# Patient Record
Sex: Male | Born: 1965 | Race: Black or African American | Hispanic: No | Marital: Married | Smoking: Former smoker
Health system: Southern US, Community
[De-identification: ages and names within clinical notes are randomized; demographics above are authoritative.]

## PROBLEM LIST (undated history)

## (undated) DIAGNOSIS — K219 Gastro-esophageal reflux disease without esophagitis: Secondary | ICD-10-CM

## (undated) DIAGNOSIS — R011 Cardiac murmur, unspecified: Secondary | ICD-10-CM

## (undated) DIAGNOSIS — I10 Essential (primary) hypertension: Secondary | ICD-10-CM

## (undated) DIAGNOSIS — G4733 Obstructive sleep apnea (adult) (pediatric): Principal | ICD-10-CM

## (undated) DIAGNOSIS — R9431 Abnormal electrocardiogram [ECG] [EKG]: Secondary | ICD-10-CM

## (undated) DIAGNOSIS — K5792 Diverticulitis of intestine, part unspecified, without perforation or abscess without bleeding: Secondary | ICD-10-CM

## (undated) DIAGNOSIS — K432 Incisional hernia without obstruction or gangrene: Secondary | ICD-10-CM

## (undated) HISTORY — DX: Cardiac murmur, unspecified: R01.1

## (undated) HISTORY — DX: Obstructive sleep apnea (adult) (pediatric): G47.33

## (undated) HISTORY — DX: Gastro-esophageal reflux disease without esophagitis: K21.9

## (undated) HISTORY — PX: WISDOM TOOTH EXTRACTION: SHX21

---

## 1989-08-18 HISTORY — PX: HIATAL HERNIA REPAIR: SHX195

## 1999-05-05 ENCOUNTER — Emergency Department (HOSPITAL_COMMUNITY): Admission: EM | Admit: 1999-05-05 | Discharge: 1999-05-05 | Payer: Self-pay

## 2001-02-06 ENCOUNTER — Emergency Department (HOSPITAL_COMMUNITY): Admission: EM | Admit: 2001-02-06 | Discharge: 2001-02-06 | Payer: Self-pay | Admitting: Emergency Medicine

## 2001-02-06 ENCOUNTER — Encounter: Payer: Self-pay | Admitting: Emergency Medicine

## 2003-11-16 ENCOUNTER — Emergency Department (HOSPITAL_COMMUNITY): Admission: EM | Admit: 2003-11-16 | Discharge: 2003-11-16 | Payer: Self-pay | Admitting: Emergency Medicine

## 2004-08-18 HISTORY — PX: COLON SURGERY: SHX602

## 2004-09-16 ENCOUNTER — Ambulatory Visit: Payer: Self-pay | Admitting: Internal Medicine

## 2004-09-18 ENCOUNTER — Emergency Department (HOSPITAL_COMMUNITY): Admission: EM | Admit: 2004-09-18 | Discharge: 2004-09-18 | Payer: Self-pay | Admitting: Emergency Medicine

## 2005-01-17 ENCOUNTER — Ambulatory Visit: Payer: Self-pay | Admitting: Internal Medicine

## 2005-07-16 ENCOUNTER — Ambulatory Visit: Payer: Self-pay | Admitting: Internal Medicine

## 2005-09-08 ENCOUNTER — Encounter: Admission: RE | Admit: 2005-09-08 | Discharge: 2005-09-08 | Payer: Self-pay | Admitting: Internal Medicine

## 2005-09-08 ENCOUNTER — Ambulatory Visit: Payer: Self-pay | Admitting: Internal Medicine

## 2005-09-11 ENCOUNTER — Emergency Department (HOSPITAL_COMMUNITY): Admission: EM | Admit: 2005-09-11 | Discharge: 2005-09-11 | Payer: Self-pay | Admitting: Emergency Medicine

## 2005-09-22 ENCOUNTER — Inpatient Hospital Stay (HOSPITAL_COMMUNITY): Admission: AD | Admit: 2005-09-22 | Discharge: 2005-09-28 | Payer: Self-pay | Admitting: Internal Medicine

## 2005-09-22 ENCOUNTER — Ambulatory Visit: Payer: Self-pay | Admitting: Internal Medicine

## 2005-10-03 ENCOUNTER — Ambulatory Visit: Payer: Self-pay | Admitting: Internal Medicine

## 2005-11-07 ENCOUNTER — Ambulatory Visit: Payer: Self-pay | Admitting: Gastroenterology

## 2005-11-11 ENCOUNTER — Ambulatory Visit (HOSPITAL_COMMUNITY): Admission: RE | Admit: 2005-11-11 | Discharge: 2005-11-11 | Payer: Self-pay | Admitting: Gastroenterology

## 2005-11-13 ENCOUNTER — Ambulatory Visit: Payer: Self-pay | Admitting: Gastroenterology

## 2005-11-28 ENCOUNTER — Ambulatory Visit: Payer: Self-pay | Admitting: Gastroenterology

## 2005-12-10 ENCOUNTER — Encounter (INDEPENDENT_AMBULATORY_CARE_PROVIDER_SITE_OTHER): Payer: Self-pay | Admitting: *Deleted

## 2005-12-10 ENCOUNTER — Ambulatory Visit (HOSPITAL_COMMUNITY): Admission: RE | Admit: 2005-12-10 | Discharge: 2005-12-10 | Payer: Self-pay | Admitting: Gastroenterology

## 2005-12-12 ENCOUNTER — Ambulatory Visit: Payer: Self-pay | Admitting: Gastroenterology

## 2006-02-17 ENCOUNTER — Inpatient Hospital Stay (HOSPITAL_COMMUNITY): Admission: EM | Admit: 2006-02-17 | Discharge: 2006-02-21 | Payer: Self-pay | Admitting: Gastroenterology

## 2006-02-17 ENCOUNTER — Ambulatory Visit: Payer: Self-pay | Admitting: Gastroenterology

## 2006-02-19 ENCOUNTER — Ambulatory Visit: Payer: Self-pay | Admitting: Cardiology

## 2006-02-19 ENCOUNTER — Encounter: Payer: Self-pay | Admitting: Cardiology

## 2006-02-24 ENCOUNTER — Ambulatory Visit: Payer: Self-pay | Admitting: Gastroenterology

## 2006-03-13 ENCOUNTER — Ambulatory Visit: Payer: Self-pay | Admitting: Gastroenterology

## 2006-04-29 ENCOUNTER — Encounter (INDEPENDENT_AMBULATORY_CARE_PROVIDER_SITE_OTHER): Payer: Self-pay | Admitting: *Deleted

## 2006-04-29 ENCOUNTER — Inpatient Hospital Stay (HOSPITAL_COMMUNITY): Admission: RE | Admit: 2006-04-29 | Discharge: 2006-05-21 | Payer: Self-pay | Admitting: General Surgery

## 2006-05-15 ENCOUNTER — Encounter: Payer: Self-pay | Admitting: Vascular Surgery

## 2006-07-03 ENCOUNTER — Ambulatory Visit: Payer: Self-pay | Admitting: Internal Medicine

## 2006-10-07 ENCOUNTER — Ambulatory Visit: Payer: Self-pay | Admitting: Internal Medicine

## 2007-01-22 ENCOUNTER — Ambulatory Visit: Payer: Self-pay | Admitting: Internal Medicine

## 2007-01-22 ENCOUNTER — Encounter: Payer: Self-pay | Admitting: Internal Medicine

## 2007-01-22 DIAGNOSIS — I10 Essential (primary) hypertension: Secondary | ICD-10-CM | POA: Insufficient documentation

## 2007-01-22 DIAGNOSIS — Z8719 Personal history of other diseases of the digestive system: Secondary | ICD-10-CM | POA: Insufficient documentation

## 2007-01-22 DIAGNOSIS — I422 Other hypertrophic cardiomyopathy: Secondary | ICD-10-CM | POA: Insufficient documentation

## 2007-01-22 DIAGNOSIS — E66812 Obesity, class 2: Secondary | ICD-10-CM | POA: Insufficient documentation

## 2007-01-22 DIAGNOSIS — I421 Obstructive hypertrophic cardiomyopathy: Secondary | ICD-10-CM | POA: Insufficient documentation

## 2007-03-05 ENCOUNTER — Emergency Department (HOSPITAL_COMMUNITY): Admission: EM | Admit: 2007-03-05 | Discharge: 2007-03-05 | Payer: Self-pay | Admitting: Emergency Medicine

## 2007-03-17 DIAGNOSIS — R1903 Right lower quadrant abdominal swelling, mass and lump: Secondary | ICD-10-CM | POA: Insufficient documentation

## 2007-04-14 ENCOUNTER — Ambulatory Visit: Payer: Self-pay | Admitting: Family Medicine

## 2007-05-20 ENCOUNTER — Encounter: Payer: Self-pay | Admitting: Internal Medicine

## 2007-07-21 ENCOUNTER — Ambulatory Visit: Payer: Self-pay | Admitting: Internal Medicine

## 2007-07-21 DIAGNOSIS — J209 Acute bronchitis, unspecified: Secondary | ICD-10-CM | POA: Insufficient documentation

## 2007-08-06 ENCOUNTER — Encounter: Payer: Self-pay | Admitting: Internal Medicine

## 2007-08-26 ENCOUNTER — Encounter: Admission: RE | Admit: 2007-08-26 | Discharge: 2007-08-26 | Payer: Self-pay | Admitting: General Surgery

## 2007-09-02 ENCOUNTER — Ambulatory Visit (HOSPITAL_COMMUNITY): Admission: RE | Admit: 2007-09-02 | Discharge: 2007-09-02 | Payer: Self-pay | Admitting: General Surgery

## 2007-09-17 ENCOUNTER — Encounter: Payer: Self-pay | Admitting: Internal Medicine

## 2007-09-28 ENCOUNTER — Ambulatory Visit: Payer: Self-pay | Admitting: Family Medicine

## 2008-01-03 ENCOUNTER — Ambulatory Visit: Payer: Self-pay | Admitting: Internal Medicine

## 2008-01-04 ENCOUNTER — Encounter: Admission: RE | Admit: 2008-01-04 | Discharge: 2008-01-04 | Payer: Self-pay | Admitting: General Surgery

## 2008-01-21 ENCOUNTER — Ambulatory Visit (HOSPITAL_COMMUNITY): Admission: RE | Admit: 2008-01-21 | Discharge: 2008-01-22 | Payer: Self-pay | Admitting: General Surgery

## 2008-01-28 ENCOUNTER — Encounter: Admission: RE | Admit: 2008-01-28 | Discharge: 2008-01-28 | Payer: Self-pay | Admitting: General Surgery

## 2008-03-02 ENCOUNTER — Ambulatory Visit: Payer: Self-pay | Admitting: Internal Medicine

## 2008-03-02 DIAGNOSIS — S838X9A Sprain of other specified parts of unspecified knee, initial encounter: Secondary | ICD-10-CM | POA: Insufficient documentation

## 2008-03-02 DIAGNOSIS — S86819A Strain of other muscle(s) and tendon(s) at lower leg level, unspecified leg, initial encounter: Secondary | ICD-10-CM

## 2008-03-10 ENCOUNTER — Encounter: Admission: RE | Admit: 2008-03-10 | Discharge: 2008-05-09 | Payer: Self-pay | Admitting: Surgery

## 2008-07-06 ENCOUNTER — Ambulatory Visit: Payer: Self-pay | Admitting: Internal Medicine

## 2008-07-07 ENCOUNTER — Encounter: Admission: RE | Admit: 2008-07-07 | Discharge: 2008-07-07 | Payer: Self-pay | Admitting: General Surgery

## 2008-10-05 ENCOUNTER — Encounter: Payer: Self-pay | Admitting: Internal Medicine

## 2008-11-03 ENCOUNTER — Ambulatory Visit: Payer: Self-pay | Admitting: Internal Medicine

## 2008-12-22 ENCOUNTER — Ambulatory Visit: Payer: Self-pay | Admitting: Internal Medicine

## 2008-12-28 ENCOUNTER — Encounter: Admission: RE | Admit: 2008-12-28 | Discharge: 2008-12-28 | Payer: Self-pay | Admitting: General Surgery

## 2009-01-08 ENCOUNTER — Encounter: Payer: Self-pay | Admitting: Internal Medicine

## 2009-01-08 ENCOUNTER — Telehealth: Payer: Self-pay | Admitting: Internal Medicine

## 2009-01-09 ENCOUNTER — Telehealth: Payer: Self-pay | Admitting: Internal Medicine

## 2009-02-06 ENCOUNTER — Encounter: Admission: RE | Admit: 2009-02-06 | Discharge: 2009-02-06 | Payer: Self-pay | Admitting: General Surgery

## 2009-03-16 ENCOUNTER — Telehealth: Payer: Self-pay | Admitting: Internal Medicine

## 2009-06-13 ENCOUNTER — Telehealth: Payer: Self-pay | Admitting: Internal Medicine

## 2009-06-29 ENCOUNTER — Ambulatory Visit: Payer: Self-pay | Admitting: Family Medicine

## 2009-06-29 DIAGNOSIS — N390 Urinary tract infection, site not specified: Secondary | ICD-10-CM | POA: Insufficient documentation

## 2009-06-29 LAB — CONVERTED CEMR LAB
Blood in Urine, dipstick: NEGATIVE
Glucose, Urine, Semiquant: NEGATIVE
Nitrite: NEGATIVE
Specific Gravity, Urine: >=1.03
Urobilinogen, UA: 0.2
pH: 5

## 2009-06-30 ENCOUNTER — Encounter: Payer: Self-pay | Admitting: Family Medicine

## 2009-07-03 LAB — CONVERTED CEMR LAB
Chlamydia, Swab/Urine, PCR: NEGATIVE
GC Probe Amp, Urine: NEGATIVE

## 2009-07-26 ENCOUNTER — Encounter (INDEPENDENT_AMBULATORY_CARE_PROVIDER_SITE_OTHER): Payer: Self-pay | Admitting: *Deleted

## 2009-07-27 ENCOUNTER — Ambulatory Visit: Payer: Self-pay | Admitting: Internal Medicine

## 2009-07-27 LAB — CONVERTED CEMR LAB
ALT: 47 units/L (ref 0–53)
AST: 43 units/L — ABNORMAL HIGH (ref 0–37)
Albumin: 4.2 g/dL (ref 3.5–5.2)
Alkaline Phosphatase: 63 units/L (ref 39–117)
BUN: 9 mg/dL (ref 6–23)
Basophils Absolute: 0 10*3/uL (ref 0.0–0.1)
Basophils Relative: 0.6 % (ref 0.0–3.0)
Bilirubin Urine: NEGATIVE
Bilirubin, Direct: 0 mg/dL (ref 0.0–0.3)
Blood in Urine, dipstick: NEGATIVE
CO2: 27 meq/L (ref 19–32)
Calcium: 9.7 mg/dL (ref 8.4–10.5)
Chloride: 105 meq/L (ref 96–112)
Cholesterol: 188 mg/dL (ref 0–200)
Creatinine, Ser: 1 mg/dL (ref 0.4–1.5)
Eosinophils Absolute: 0.2 10*3/uL (ref 0.0–0.7)
Eosinophils Relative: 2.5 % (ref 0.0–5.0)
GFR calc non Af Amer: 104.24 mL/min (ref >60)
Glucose, Bld: 106 mg/dL — ABNORMAL HIGH (ref 70–99)
Glucose, Urine, Semiquant: NEGATIVE
HCT: 45 % (ref 39.0–52.0)
HDL: 49.9 mg/dL (ref >39.00)
Hemoglobin: 15.1 g/dL (ref 13.0–17.0)
Ketones, urine, test strip: NEGATIVE
LDL Cholesterol: 113 mg/dL — ABNORMAL HIGH (ref 0–99)
Lymphocytes Relative: 23.9 % (ref 12.0–46.0)
Lymphs Abs: 1.5 10*3/uL (ref 0.7–4.0)
MCHC: 33.6 g/dL (ref 30.0–36.0)
MCV: 87.4 fL (ref 78.0–100.0)
Monocytes Absolute: 0.6 10*3/uL (ref 0.1–1.0)
Monocytes Relative: 8.9 % (ref 3.0–12.0)
Neutro Abs: 4 10*3/uL (ref 1.4–7.7)
Neutrophils Relative %: 64.1 % (ref 43.0–77.0)
Nitrite: NEGATIVE
PSA: 0.33 ng/mL (ref 0.10–4.00)
Platelets: 216 10*3/uL (ref 150.0–400.0)
Potassium: 3.7 meq/L (ref 3.5–5.1)
Protein, U semiquant: NEGATIVE
RBC: 5.15 M/uL (ref 4.22–5.81)
RDW: 12.2 % (ref 11.5–14.6)
Sodium: 139 meq/L (ref 135–145)
Specific Gravity, Urine: 1.02
TSH: 0.91 microintl units/mL (ref 0.35–5.50)
Total Bilirubin: 0.7 mg/dL (ref 0.3–1.2)
Total CHOL/HDL Ratio: 4
Total Protein: 7.7 g/dL (ref 6.0–8.3)
Triglycerides: 124 mg/dL (ref 0.0–149.0)
Urobilinogen, UA: 0.2
VLDL: 24.8 mg/dL (ref 0.0–40.0)
WBC Urine, dipstick: NEGATIVE
WBC: 6.3 10*3/uL (ref 4.5–10.5)
pH: 5.5

## 2009-08-18 HISTORY — PX: LAPAROSCOPIC GASTRIC BANDING: SHX1100

## 2009-08-31 ENCOUNTER — Ambulatory Visit: Payer: Self-pay | Admitting: Internal Medicine

## 2009-11-22 ENCOUNTER — Ambulatory Visit: Payer: Self-pay | Admitting: Family Medicine

## 2009-11-22 DIAGNOSIS — J019 Acute sinusitis, unspecified: Secondary | ICD-10-CM | POA: Insufficient documentation

## 2009-12-17 ENCOUNTER — Encounter: Admission: RE | Admit: 2009-12-17 | Discharge: 2009-12-17 | Payer: Self-pay | Admitting: General Surgery

## 2009-12-21 ENCOUNTER — Encounter: Payer: Self-pay | Admitting: Internal Medicine

## 2009-12-27 ENCOUNTER — Ambulatory Visit: Payer: Self-pay | Admitting: Internal Medicine

## 2009-12-27 DIAGNOSIS — F411 Generalized anxiety disorder: Secondary | ICD-10-CM | POA: Insufficient documentation

## 2010-02-15 ENCOUNTER — Ambulatory Visit: Payer: Self-pay | Admitting: Internal Medicine

## 2010-03-13 ENCOUNTER — Telehealth: Payer: Self-pay | Admitting: Internal Medicine

## 2010-04-23 ENCOUNTER — Telehealth: Payer: Self-pay | Admitting: Internal Medicine

## 2010-04-24 ENCOUNTER — Telehealth: Payer: Self-pay | Admitting: Internal Medicine

## 2010-06-21 ENCOUNTER — Ambulatory Visit: Payer: Self-pay | Admitting: Internal Medicine

## 2010-09-08 ENCOUNTER — Encounter: Payer: Self-pay | Admitting: Gastroenterology

## 2010-09-17 NOTE — Letter (Signed)
Summary: Surgery By [facility]   Imported By: Maryln Gottron 10/19/2008 13:12:35  _____________________________________________________________________  External Attachment:    Type:   Image     Comment:   External Document

## 2010-09-17 NOTE — Progress Notes (Signed)
Summary: jock itch  Phone Note Call from Patient   Caller: Patient Call For: Gordy Savers  MD Summary of Call: Pt is complaining of "jock itch", and would like RX called in to pharmacy.  [place] [phone] Initial call taken by: Lynann Beaver CMA,  March 13, 2010 10:10 AM  Follow-up for Phone Call        lamisil cream use two times a day  Follow-up by: Gordy Savers  MD,  March 13, 2010 10:51 AM    New/Updated Medications: LAMISIL AT 1 % CREA (TERBINAFINE HCL) Apply two times a day to affected areas. Prescriptions: LAMISIL AT 1 % CREA (TERBINAFINE HCL) Apply two times a day to affected areas.  #1 tube x 2   Entered by:   Lynann Beaver CMA   Authorized by:   Gordy Savers  MD   Signed by:   Lynann Beaver CMA on 03/13/2010   Method used:   Electronically to        [facility]* (retail)       [address] [place], [place]  [address]       Ph: [phone] or [phone]       Fax: [phone]   RxID:   [id number]  Pt. notified.

## 2010-09-17 NOTE — Assessment & Plan Note (Signed)
Summary: 4 month fup//ccm   Vital Signs:  Patient profile:   45 year old male Weight:      308 pounds Temp:     98.7 degrees F oral BP sitting:   112 / 70  (right arm) Cuff size:   large  Vitals Entered By: Duard Brady LPN (June 21, 2010 11:22 AM) CC: 4 mos rov - doing well Is Patient Diabetic? No Flu Vaccine Consent Questions     Do you have a history of severe allergic reactions to this vaccine? no    Any prior history of allergic reactions to egg and/or gelatin? no    Do you have a sensitivity to the preservative Thimersol? no    Do you have a past history of Guillan-Barre Syndrome? no    Do you currently have an acute febrile illness? no    Have you ever had a severe reaction to latex? no    Vaccine information given and explained to patient? yes    Are you currently pregnant? no    Lot Number:[id number]   Exp Date:02/15/2011   Site Given  Left Deltoid IM   CC:  4 mos rov - doing well.  History of Present Illness: 45 year old patient who has a history of exogenous obesity, status post Lap  band surgery.  He has a history of hypertension, as well as hypertrophic cardiomyopathy.  He is doing quite well.  He is followed closely by general surgery.  He continues to live weight slowly after a very nice initial postoperative response  Allergies: 1)  ! * Ct Dye 2)  Levaquin (Levofloxacin)  Past History:  Past Medical History: Reviewed history from 12/27/2009 and no changes required. Hypertension Obesity Hypertrophic cardiomyopathy Diverticulitis, hx of Anxiety  Past Surgical History: Hiatal hernia repair Colectomy, sigmoid 2006 Laparotmy for intra-abdominal bleeding Lap-band surgery June 2009   Review of Systems  The patient denies anorexia, fever, weight loss, weight gain, vision loss, decreased hearing, hoarseness, chest pain, syncope, dyspnea on exertion, peripheral edema, prolonged cough, headaches, hemoptysis, abdominal pain, melena, hematochezia,  severe indigestion/heartburn, hematuria, incontinence, genital sores, muscle weakness, suspicious skin lesions, transient blindness, difficulty walking, depression, unusual weight change, abnormal bleeding, enlarged lymph nodes, angioedema, breast masses, and testicular masses.    Physical Exam  General:  overweight-appearing.  110/70 Head:  Normocephalic and atraumatic without obvious abnormalities. No apparent alopecia or balding. Eyes:  No corneal or conjunctival inflammation noted. EOMI. Perrla. Funduscopic exam benign, without hemorrhages, exudates or papilledema. Vision grossly normal. Mouth:  Oral mucosa and oropharynx without lesions or exudates.  Teeth in good repair. Neck:  No deformities, masses, or tenderness noted. Lungs:  Normal respiratory effort, chest expands symmetrically. Lungs are clear to auscultation, no crackles or wheezes. Heart:  Normal rate and regular rhythm. S1 and S2 normal without gallop, murmur, click, rub or other extra sounds.  .  No murmur in the supine position Abdomen:  Bowel sounds positive,abdomen soft and non-tender without masses, organomegaly or hernias noted.   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 Take 1 tablet by mouth at bedtime    Lisinopril-hydrochlorothiazide 20-25 Mg Tabs (Lisinopril-hydrochlorothiazide) ..... One daily  Problem # 3:  CARDIOMYOPATHY, HYPERTROPHIC (ICD-425.1)  Complete Medication List: 1)  Verapamil Hcl Cr 240 Mg Tbcr (Verapamil hcl) .... Take 1 tablet by mouth  at bedtime 2)  Sertraline Hcl 50 Mg Tabs (Sertraline hcl) .... One every morning 3)  Alprazolam 0.5 Mg Tabs (Alprazolam) .... One twice daily as needed for anxiety 4)  Lisinopril-hydrochlorothiazide 20-25 Mg Tabs (Lisinopril-hydrochlorothiazide) .... One daily 5)  Lamisil At 1 %  Crea (Terbinafine hcl) .... Apply two times a day to affected areas.  Other Orders: Admin 1st Vaccine ([address]) Flu Vaccine 73yrs + (60454)  Patient Instructions: 1)  Please schedule a follow-up appointment in 6 months. 2)  Limit your Sodium (Salt). 3)  It is important that you exercise regularly at least 20 minutes 5 times a week. If you develop chest pain, have severe difficulty breathing, or feel very tired , stop exercising immediately and seek medical attention. 4)  You need to lose weight. Consider a lower calorie diet and regular exercise.  Prescriptions: LISINOPRIL-HYDROCHLOROTHIAZIDE 20-25 MG TABS (LISINOPRIL-HYDROCHLOROTHIAZIDE) one daily  #90 x 4   Entered and Authorized by:   Gordy Savers  MD   Signed by:   Gordy Savers  MD on 06/21/2010   Method used:   Print then Give to Patient   RxID:   [id number] ALPRAZOLAM 0.5 MG TABS (ALPRAZOLAM) one twice daily as needed for anxiety  #90 x 2   Entered and Authorized by:   Gordy Savers  MD   Signed by:   Gordy Savers  MD on 06/21/2010   Method used:   Print then Give to Patient   RxID:   [id number] VERAPAMIL HCL CR 240 MG TBCR (VERAPAMIL HCL) Take 1 tablet by mouth at bedtime  #90 x 6   Entered and Authorized by:   Gordy Savers  MD   Signed by:   Gordy Savers  MD on 06/21/2010   Method used:   Print then Give to Patient   RxID:   [id number] SERTRALINE HCL 50 MG TABS (SERTRALINE HCL) one every morning  #90 x 4   Entered and Authorized by:   Gordy Savers  MD   Signed by:   Gordy Savers  MD on 06/21/2010   Method used:   Print then Give to Patient   RxID:   [id number]    Orders Added: 1)  Admin 1st Vaccine [90471] 2)  Flu Vaccine 3yrs + [64332] 3)  Est. Patient Level III [95188]

## 2010-09-17 NOTE — Assessment & Plan Note (Signed)
   Vital Signs:  Patient Profile:   45 Years Old Male Weight:      350 pounds  Pt. in pain?   no                Chief Complaint:  HTN.  History of Present Illness: 45 y/o f/u HTN;  scheduled to see Dr Johna Sheriff for umbilical hernia repair        Physical Exam  General:     overweight-appearing.   Neck:     No deformities, masses, or tenderness noted. Lungs:     Normal respiratory effort, chest expands symmetrically. Lungs are clear to auscultation, no crackles or wheezes. Heart:     Normal rate and regular rhythm. S1 and S2 normal without gallop, murmur, click, rub or other extra sounds. Abdomen:     soft and non-tender.  obese midline scar with sm umbilical hernia     Impression & Recommendations:  Problem # 1:  HTN RX:  benazaril 40 HCTZ 25 Verapamil SR 240  Rov 6 mon

## 2010-09-17 NOTE — Progress Notes (Signed)
  Phone Note Call from Patient   Caller: Spouse Call For: Chad Savers  MD Summary of Call: Pt is asking for an antibiotic for sinus congestion and dizziness.  Taking Mucinex with Sudafed.  Will call back with pharmacy number in [place].  [phone]  [address] [place], [place] Initial call taken by: Lynann Beaver CMA,  April 24, 2010 9:31 AM  Follow-up for Phone Call        unlikely patient has a bacterial infection if his  symptoms have been present for less than 10 days Follow-up by: Chad Savers  MD,  April 24, 2010 12:59 PM  Additional Follow-up for Phone Call Additional follow up Details #1::        Message left on personal voice mail. Additional Follow-up by: Lynann Beaver CMA,  April 24, 2010 1:37 PM     Appended Document:  Patient called about antibiotic.  Declined appointment.    Appended Document:  Wife also called.

## 2010-09-17 NOTE — Assessment & Plan Note (Signed)
Summary: legs and feet swelling/jls   Vital Signs:  Patient Profile:   45 Years Old Male Height:     75 inches (190.50 cm) Weight:      427 pounds (194.09 kg) Pulse rate:   71 / minute BP sitting:   111 / 68  (right arm)  Vitals Entered By: Arcola Jansky, RN (September 28, 2007 2:01 PM) Weight > 350 lbs. Yes                 Chief Complaint:  "swelling of feet, legs, ankles, and & hands"  onset 2/1/9.  History of Present Illness: Mr. this is a 45 year old male, who weighs in at over 400 pounds, who is on schedule to get a consult from surgery about weight reduction surgery, who comes in today for evaluation of bilateral lower extremity edema.  He notices problem after the Super Bowl party worries a lot salt.  He's otherwise been well.  He is in no pain, shortness of breath, et Karie Soda.    Current Allergies (reviewed today): LEVAQUIN (LEVOFLOXACIN)     Review of Systems      See HPI   Physical Exam  General:     Well-developed,well-nourished,in no acute distress; alert,appropriate and cooperative throughout examination Extremities:     2+ left pedal edema and 2+ right pedal edema.      Impression & Recommendations:  Problem # 1:  CARDIOMYOPATHY, HYPERTROPHIC (ICD-425.1) Assessment: Deteriorated  Complete Medication List: 1)  Hydrochlorothiazide 25 Mg Tabs (Hydrochlorothiazide) .... Take 1 tablet by mouth every morning 2)  Verapamil Hcl Cr 240 Mg Tbcr (Verapamil hcl) .... Take 1 tablet by mouth at bedtime 3)  Benazepril Hcl 40 Mg Tabs (Benazepril hcl) .[name].. 1 once daily   Patient Instructions: 1)  stay on complete salt free diet.  Take HCTZ, 50 mg daily for 3 to 4 days.  This should resolve the fluid retention problem.  If it does not then let us know    ]

## 2010-09-17 NOTE — Progress Notes (Signed)
  Phone Note Outgoing Call   Call placed by: Raechel Ache, RN,  Jan 09, 2009 10:30 AM Call placed to: Patient Summary of Call: [facility] re: letter.  Follow-up for Phone Call        faxed to dentist [phone] Follow-up by: Raechel Ache, RN,  Jan 09, 2009 5:05 PM

## 2010-09-17 NOTE — Assessment & Plan Note (Signed)
Summary: ?LESION ON STOMACH/NJR  Medications Added HYDROCHLOROTHIAZIDE 25 MG TABS (HYDROCHLOROTHIAZIDE) Take 1 tablet by mouth every morning LEVAQUIN 500 MG TABS (LEVOFLOXACIN) Take 1 tablet by mouth once a day VERAPAMIL HCL CR 240 MG TBCR (VERAPAMIL HCL) Take 1 tablet by mouth at bedtime        Vital Signs:  Patient Profile:   45 Years Old Male Height:     75 inches (190.50 cm) Temp:     98.4 degrees F (36.89 degrees C) oral BP sitting:   150 / 86  (right arm)  Pt. in pain?   no  Vitals Entered By: Arcola Jansky, RN (April 14, 2007 11:50 AM) Weight > 350 lbs. Yes                Chief Complaint:  red white pigmented growth on abd. that" bleeds when open to air"  onset 3-4 months.  History of Present Illness: Chad Lyons comes in today for evaluation of a lesion in his right anterior abdominal wall.  Please note noticed a bump on his right anterior abdominal wall about 4 to 6 weeks ago, which increased in size.  He's tried over-the-counter medication, but nothing is helping to get surgery awhile back by Dr. Johna Sheriff for diverticulitis.  His current medications are verapamil 240 daily, HCTZ 25 mg daily, and benazapril 40 mg.. daily.  His chart lists drug allergy is Levaquin.  But does not recorded on his med list.  Acute Visit History:      He denies abdominal pain, chest pain, constipation, cough, diarrhea, earache, eye symptoms, fever, genitourinary symptoms, headache, musculoskeletal symptoms, nasal discharge, nausea, rash, sinus problems, sore throat, and vomiting.         Current Allergies: LEVAQUIN (LEVOFLOXACIN)  Past Medical History:    Reviewed history from 01/22/2007 and no changes required:       Hypertension       Obesity       Hypertrophic cardiomyopathy       Diverticulitis, hx of     Review of Systems      See HPI   Physical Exam  General:     Well-developed,well-nourished,in no acute distress; alert,appropriate and cooperative throughout  examination Skin:     there is a quarter size pedunculated lesion on the anterior abdominal wall.  The right lower quadrant.  There is a scar in the midline from previous surgery for diverticulitis.    Impression & Recommendations:  Problem # 1:  ABDOMINAL MASS, RIGHT LOWER QUADRANT (URK-270.62) Assessment: New  Complete Medication List: 1)  Hydrochlorothiazide 25 Mg Tabs (Hydrochlorothiazide) .... Take 1 tablet by mouth every morning 2)  Levaquin 500 Mg Tabs (Levofloxacin) .... Take 1 tablet by mouth once a day 3)  Verapamil Hcl Cr 240 Mg Tbcr (Verapamil hcl) .... Take 1 tablet by mouth at bedtime 4)  Hydrochlorothiazide 25 Mg Tabs (Hydrochlorothiazide) .... Take 1 tablet by mouth every morning 5)  Levaquin 500 Mg Tabs (Levofloxacin) .... Take 1 tablet by mouth once a day 6)  Verapamil Hcl Cr 240 Mg Tbcr (Verapamil hcl) .... Take 1 tablet by mouth at bedtime   Patient Instructions: 1)  I would call Dr. Jaclynn Guarneri your general surgeon for evaluation of this lesion.

## 2010-09-17 NOTE — Consult Note (Signed)
Summary: [facility]   Imported By: Lanelle Bal 10/27/2007 11:10:42  _____________________________________________________________________  External Attachment:    Type:   Image     Comment:   External Document

## 2010-09-17 NOTE — Assessment & Plan Note (Signed)
Summary: 6 wk rov/njr   Vital Signs:  Patient profile:   45 year old male Weight:      316 pounds Temp:     98.1 degrees F oral BP sitting:   114 / 74  (right arm) Cuff size:   large  Vitals Entered By: Duard Brady LPN (February 15, 1609 10:38 AM) CC: 6 wk rov - doing well Is Patient Diabetic? No   CC:  6 wk rov - doing well.  History of Present Illness: 45 year old patient who is in today for follow-up of his hypertension.  Lisinopril was added to his regimen.  Last visit.  Home blood pressure readings have been under much better control area in the stress level is also improved.  He feels well today and continues to lose weight  Allergies: 1)  ! * Ct Dye 2)  Levaquin (Levofloxacin)  Past History:  Past Medical History: Reviewed history from 12/27/2009 and no changes required. Hypertension Obesity Hypertrophic cardiomyopathy Diverticulitis, hx of Anxiety  Physical Exam  General:  Well-developed,well-nourished,in no acute distress; alert,appropriate and cooperative throughout examination; 120/76   Impression & Recommendations:  Problem # 1:  ANXIETY (ICD-300.00)  His updated medication list for this problem includes:    Sertraline Hcl 50 Mg Tabs (Sertraline hcl) ..... One every morning    Alprazolam 0.5 Mg Tabs (Alprazolam) ..... One twice daily as needed for anxiety  His updated medication list for this problem includes:    Sertraline Hcl 50 Mg Tabs (Sertraline hcl) ..... One every morning    Alprazolam 0.5 Mg Tabs (Alprazolam) ..... One twice daily as needed for anxiety  Problem # 2:  HYPERTENSION (ICD-401.9)  His updated medication list for this problem includes:    Verapamil Hcl Cr 240 Mg Tbcr (Verapamil hcl) .[name]... Take 1 tablet by mouth at bedtime    Lisinopril-hydrochlorothiazide 20-25 Mg Tabs (Lisinopril-hydrochlorothiazide) ..... One daily  His updated medication list for this problem includes:    Verapamil Hcl Cr 240 Mg Tbcr (Verapamil hcl) .[name]...  Take 1 tablet by mouth at bedtime    Lisinopril-hydrochlorothiazide 20-25 Mg Tabs (Lisinopril-hydrochlorothiazide) ..... One daily  Complete Medication List: 1)  Verapamil Hcl Cr 240 Mg Tbcr (Verapamil hcl) .... Take 1 tablet by mouth at bedtime 2)  Sertraline Hcl 50 Mg Tabs (Sertraline hcl) .... One every morning 3)  Alprazolam 0.5 Mg Tabs (Alprazolam) .... One twice daily as needed for anxiety 4)  Lisinopril-hydrochlorothiazide 20-25 Mg Tabs (Lisinopril-hydrochlorothiazide) .... One daily  Patient Instructions: 1)  Please schedule a follow-up appointment in 4 months. 2)  Limit your Sodium (Salt). 3)  It is important that you exercise regularly at least 20 minutes 5 times a week. If you develop chest pain, have severe difficulty breathing, or feel very tired , stop exercising immediately and seek medical attention. 4)  You need to lose weight. Consider a lower calorie diet and regular exercise.  5)  Check your Blood Pressure regularly. If it is above: 150/90 you should make an appointment.

## 2010-09-17 NOTE — Consult Note (Signed)
Summary: [facility] note  [facility] note   Imported By: Kassie Mends 09/21/2007 09:37:02  _____________________________________________________________________  External Attachment:    Type:   Image     Comment:   L[facility] note

## 2010-09-17 NOTE — Miscellaneous (Signed)
Summary: Orders Update  Clinical Lists Changes  Orders: Added new Service order of Est. Patient Level III (99213) - Signed 

## 2010-09-17 NOTE — Progress Notes (Signed)
Summary: refill alprazolam  Phone Note Refill Request Message from:  Fax from Pharmacy on April 23, 2010 1:14 PM  Refills Requested: Medication #1:  ALPRAZOLAM 0.5 MG TABS one twice daily as needed for anxiety [place]   Method Requested: Fax to Local Pharmacy Initial call taken by: Duard Brady LPN,  April 23, 2010 1:14 PM    Prescriptions: ALPRAZOLAM 0.5 MG TABS (ALPRAZOLAM) one twice daily as needed for anxiety  #90 x 0   Entered by:   Duard Brady LPN   Authorized by:   Gordy Savers  MD   Signed by:   Duard Brady LPN on 29/56/2130   Method used:   Historical   RxID:   [id number]  faxed to pharmacy   KIK

## 2010-09-17 NOTE — Assessment & Plan Note (Signed)
Summary: 4 month rov/njr   Vital Signs:  Patient profile:   45 year old male Temp:     98.5 degrees F oral BP sitting:   148 / 98 Cuff size:   large  Vitals Entered By: Raechel Ache, RN (November 03, 2008 10:14 AM)   History of Present Illness: 45 year old patient seen today for follow-up.  He has a history of hypertension and also is approximate 9 months status post lap band surgery.  He has lost 91 pounds in weight.  He feels well today.  He does monitor his blood pressure readings at home and very are in the low one-20s systolic and low 16X diastolics.  He is exercising regularly and continues to lose weight at a controlled pace.  He has a history of hypertrophic cardiomyopathy.  No cardiopulmonary complaints  Problems Prior to Update: 1)  Muscle Strain, Hamstring Muscle  (ICD-844.8) 2)  Bronchitis, Acute  (ICD-466.0) 3)  Abdominal Mass, Right Lower Quadrant  (ICD-789.33) 4)  Diverticulitis, Hx of  (ICD-V12.79) 5)  Obesity, Morbid  (ICD-278.01) 6)  Cardiomyopathy, Hypertrophic  (ICD-425.1) 7)  Hypertension  (ICD-401.9)  Medications Prior to Update: 1)  Verapamil Hcl Cr 240 Mg Tbcr (Verapamil Hcl) .... Take 1 Tablet By Mouth At Bedtime 2)  Hydrochlorothiazide 25 Mg Tabs (Hydrochlorothiazide) .[name].. 1 Once Daily  Allergies: 1)  ! * Ct Dye 2)  Levaquin (Levofloxacin)  Past History:  Past Medical History:    Reviewed history from 01/22/2007 and no changes required:    Hypertension    Obesity    Hypertrophic cardiomyopathy    Diverticulitis, hx of  Review of Systems  The patient denies anorexia, fever, weight loss, weight gain, vision loss, decreased hearing, hoarseness, chest pain, syncope, dyspnea on exertion, peripheral edema, prolonged cough, headaches, hemoptysis, abdominal pain, melena, hematochezia, severe indigestion/heartburn, hematuria, incontinence, genital sores, muscle weakness, suspicious skin lesions, transient blindness, difficulty walking, depression, unusual  weight change, abnormal bleeding, enlarged lymph nodes, angioedema, breast masses, and testicular masses.    Physical Exam  General:  overweight-appearing.  140/84 Head:  Normocephalic and atraumatic without obvious abnormalities. No apparent alopecia or balding. Eyes:  No corneal or conjunctival inflammation noted. EOMI. Perrla. Funduscopic exam benign, without hemorrhages, exudates or papilledema. Vision grossly normal. Mouth:  Oral mucosa and oropharynx without lesions or exudates.  Teeth in good repair. Neck:  No deformities, masses, or tenderness noted. Lungs:  Normal respiratory effort, chest expands symmetrically. Lungs are clear to auscultation, no crackles or wheezes. Heart:  Normal rate and regular rhythm. S1 and S2 normal without gallop, murmur, click, rub or other extra sounds.  no murmur noted Abdomen:  Bowel sounds positive,abdomen soft and non-tender without masses, organomegaly or hernias noted. Extremities:  trace left pedal edema and trace right pedal edema.     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 Take 1 tablet by mouth at bedtime    Hydrochlorothiazide 25 Mg Tabs (Hydrochlorothiazide) .[name]... 1 once daily  Complete Medication List: 1)  Verapamil Hcl Cr 240 Mg Tbcr (Verapamil hcl) .... Take 1 tablet by mouth at bedtime 2)  Hydrochlorothiazide 25 Mg Tabs (Hydrochlorothiazide) .[name].. 1 once daily  Patient Instructions: 1)  Please schedule a follow-up appointment in 4 months. 2)  Limit your Sodium (Salt). 3)  It is important that you exercise regularly at least 20 minutes 5 times a week. If you develop chest pain, have severe difficulty breathing, or feel very tired , stop exercising immediately and seek medical attention. 4)   You need to lose weight. Consider a lower calorie diet and regular exercise.  5)  Check your Blood Pressure regularly. If it is above: 130/90 you should make an appointment. Prescriptions: HYDROCHLOROTHIAZIDE 25 MG TABS (HYDROCHLOROTHIAZIDE) 1 once daily  #90 x 6   Entered and Authorized by:   Gordy Savers  MD   Signed by:   Gordy Savers  MD on 11/03/2008   Method used:   Print then Give to Patient   RxID:   [id number] VERAPAMIL HCL CR 240 MG TBCR (VERAPAMIL HCL) Take 1 tablet by mouth at bedtime  #90 x 6   Entered and Authorized by:   Gordy Savers  MD   Signed by:   Gordy Savers  MD on 11/03/2008   Method used:   Print then Give to Patient   RxID:   [id number]

## 2010-09-17 NOTE — Letter (Signed)
Summary: [facility]   Imported By: Maryln Gottron 01/31/2010 13:36:08  _____________________________________________________________________  External Attachment:    Type:   Image     Comment:   External Document

## 2010-09-17 NOTE — Assessment & Plan Note (Signed)
Summary: K PT/UTI/PS   Vital Signs:  Patient profile:   45 year old male Weight:      336 pounds Temp:     98.4 degrees F oral BP sitting:   142 / 80  Vitals Entered By: Lynann Beaver CMA (June 29, 2009 3:15 PM) CC: ? UTI Is Patient Diabetic? No Pain Assessment Patient in pain? no        History of Present Illness: Here for 5 days of increased urgency and frequency of urinations, also some occasional urethral DC. No burning or pain or fever. Drinking water and cranberry juice.   Allergies: 1)  ! * Ct Dye 2)  Levaquin (Levofloxacin)  Past History:  Past Medical History: Reviewed history from 01/22/2007 and no changes required. Hypertension Obesity Hypertrophic cardiomyopathy Diverticulitis, hx of  Past Surgical History: Reviewed history from 07/06/2008 and no changes required. Hiatal hernia repair Colectomy, sigmoid Laparotmy for intra-abdominal bleeding Lap-band surgery June 2009   Review of Systems  The patient denies anorexia, fever, weight loss, weight gain, vision loss, decreased hearing, hoarseness, chest pain, syncope, dyspnea on exertion, peripheral edema, prolonged cough, headaches, hemoptysis, abdominal pain, melena, hematochezia, severe indigestion/heartburn, hematuria, incontinence, genital sores, muscle weakness, suspicious skin lesions, transient blindness, difficulty walking, depression, unusual weight change, abnormal bleeding, enlarged lymph nodes, angioedema, breast masses, and testicular masses.    Physical Exam  General:  Well-developed,well-nourished,in no acute distress; alert,appropriate and cooperative throughout examination Abdomen:  Bowel sounds positive,abdomen soft and non-tender without masses, organomegaly or hernias noted. Genitalia:  Testes bilaterally descended without nodularity, tenderness or masses. No scrotal masses or lesions. No penis lesions or urethral discharge. Prostate:  no gland enlargement, no nodules, no asymmetry,  and tender.     Impression & Recommendations:  Problem # 1:  UTI (ICD-599.0)  His updated medication list for this problem includes:    Doxycycline Hyclate 100 Mg Caps (Doxycycline hyclate) .[name]..[name] Two times a day  Orders: UA Dipstick W/ Micro (manual) (60454) T-Culture, Urine (09811-91478) T-GC Probe, urine (29562-13086) T-Chlamydia  Probe, urine ([id number])  Complete Medication List: 1)  Verapamil Hcl Cr 240 Mg Tbcr (Verapamil hcl) .... Take 1 tablet by mouth at bedtime 2)  Hydrochlorothiazide 25 Mg Tabs (Hydrochlorothiazide) .[name].. 1 once daily 3)  Doxycycline Hyclate 100 Mg Caps (Doxycycline hyclate) .... Two times a day  Patient Instructions: 1)  Await culture and probe results.  2)  Please schedule a follow-up appointment as needed .  Prescriptions: DOXYCYCLINE HYCLATE 100 MG CAPS (DOXYCYCLINE HYCLATE) two times a day  #28 x 0   Entered and Authorized by:   Nelwyn Salisbury MD   Signed by:   Nelwyn Salisbury MD on 06/29/2009   Method used:   Electronically to        [facility]* (retail)       [address] [place], [place]  [address]       Ph: [phone] or [phone]       Fax: [phone]   RxID:   [id number]   Laboratory Results   Urine Tests    Routine Urinalysis   Color: yellow Appearance: Clear Glucose: negative   (Normal Range: Negative) Bilirubin: 1+   (Normal Range: Negative) Ketone: trace (5)   (Normal Range: Negative) Spec. Gravity: >=1.030   (Normal Range: 1.003-1.035) Blood: negative   (Normal Range: Negative) pH: 5.0   (Normal Range: 5.0-8.0) Protein:  trace   (Normal Range: Negative) Urobilinogen: 0.2   (Normal Range: 0-1) Nitrite: negative   (Normal Range: Negative) Leukocyte Esterace: 1+   (Normal Range: Negative)    Comments: Rita Ohara  June 29, 2009 3:14 PM

## 2010-09-17 NOTE — Assessment & Plan Note (Signed)
Summary: 4 month rov/njr/PT RESCD FROM BUMP/CCM rsc per doc/njr   Vital Signs:  Patient Profile:   45 Years Old Male Height:     75 inches (190.50 cm) Temp:     98.5 degrees F oral BP sitting:   132 / 88  (left arm) Cuff size:   large  Vitals Entered By: Raechel Ache, RN (July 06, 2008 11:32 AM) Weight > 350 lbs. 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  He is status post lap band surgery in June of this year.  He is doing quite well and has lost an estimated 60 pounds in weight.  He is scheduled for follow-up with the general surgeon in January for consideration of adjusting his lab band. History of hypertension, which has been stable and also a history of hypertrophic cardiomyopathy.  He denies any cardiopulmonary complaints.  He has been compliant with his blood pressure medication.  Blood pressures have been stable off benazapril    Current Allergies: LEVAQUIN (LEVOFLOXACIN)  Past Medical History:    Reviewed history from 01/22/2007 and no changes required:       Hypertension       Obesity       Hypertrophic cardiomyopathy       Diverticulitis, hx of  Past Surgical History:    Reviewed history from 01/22/2007 and no changes required:       Hiatal hernia repair       Colectomy, sigmoid       Laparotmy for intra-abdominal bleeding       Lap-band surgery June 2009      Review of Systems       The patient complains of weight loss.  The patient denies anorexia, fever, weight gain, vision loss, decreased hearing, hoarseness, chest pain, syncope, dyspnea on exertion, peripheral edema, prolonged cough, headaches, hemoptysis, abdominal pain, melena, hematochezia, severe indigestion/heartburn, hematuria, incontinence, genital sores, muscle weakness, suspicious skin lesions, transient blindness, difficulty walking, depression, unusual weight change, abnormal  bleeding, enlarged lymph nodes, angioedema, breast masses, and testicular masses.     Physical Exam  General:     overweight-appearing.   120/78 Head:     Normocephalic and atraumatic without obvious abnormalities. No apparent alopecia or balding. Eyes:     No corneal or conjunctival inflammation noted. EOMI. Perrla. Funduscopic exam benign, without hemorrhages, exudates or papilledema. Vision grossly normal. Mouth:     Oral mucosa and oropharynx without lesions or exudates.  Teeth in good repair. Neck:     No deformities, masses, or tenderness noted. Lungs:     Normal respiratory effort, chest expands symmetrically. Lungs are clear to auscultation, no crackles or wheezes. Heart:     Normal rate and regular rhythm. S1 and S2 normal without gallop, murmur, click, rub or other extra sounds.   no audible murmur Abdomen:     obese soft and nontender Msk:     No deformity or scoliosis noted of thoracic or lumbar spine.   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 1 once daily  His updated medication list for this problem includes:    Verapamil Hcl Cr 240 Mg Tbcr (Verapamil hcl) .[name]... Take 1 tablet by mouth at bedtime    Hydrochlorothiazide 25 Mg Tabs (Hydrochlorothiazide) .[name]... 1 once daily   Complete Medication List: 1)  Verapamil Hcl Cr 240 Mg Tbcr (Verapamil hcl) .... Take 1 tablet by mouth at bedtime 2)  Hydrochlorothiazide 25 Mg Tabs (Hydrochlorothiazide) .[name].. 1 once daily   Patient Instructions: 1)  Please schedule a follow-up appointment in 4 months. 2)  Limit your Sodium (Salt). 3)  It is important that  you exercise regularly at least 20 minutes 5 times a week. If you develop chest pain, have severe difficulty breathing, or feel very tired , stop exercising immediately and seek medical attention. 4)  You need to lose weight. Consider a lower calorie diet and regular exercise.    Prescriptions: HYDROCHLOROTHIAZIDE 25 MG TABS (HYDROCHLOROTHIAZIDE) 1 once daily  #90 x 6   Entered and Authorized by:   Gordy Savers  MD   Signed by:   Gordy Savers  MD on 07/06/2008   Method used:   Print then Give to Patient   RxID:   [id number] VERAPAMIL HCL CR 240 MG TBCR (VERAPAMIL HCL) Take 1 tablet by mouth at bedtime  #90 x 6   Entered and Authorized by:   Gordy Savers  MD   Signed by:   Gordy Savers  MD on 07/06/2008   Method used:   Print then Give to Patient   RxID:   [id number]  ]

## 2010-09-17 NOTE — Assessment & Plan Note (Signed)
Summary: pulled left hamstring muscle pain/nn   Vital Signs:  Patient Profile:   45 Years Old Male Height:     75 inches (190.50 cm) Temp:     98.2 degrees F oral BP sitting:   144 / 90  (left arm) Cuff size:   large  Vitals Entered By: Raechel Ache, RN (March 02, 2008 9:34 AM) Weight > 350 lbs. Yes                 Chief Complaint:  C/o sore and swollen L hamstring muscle-hurt it Monday. Had lap-band surgery June 5th; lost 56#.[name]  History of Present Illness: 45 year old gentleman with history of hypertension.  His lip on the kitchen floor 4 days ago and hyperextended his left leg.  He has pain and swelling involving the left posterior medial thigh.  Approximately 6 weeks ago.  He had LAP-BAND surgery and has lost almost 70 pounds    Current Allergies: LEVAQUIN (LEVOFLOXACIN)  Past Medical History:    Reviewed history from 01/22/2007 and no changes required:       Hypertension       Obesity       Hypertrophic cardiomyopathy       Diverticulitis, hx of      Physical Exam  General:     overweight-appearing.  126/80 Neck:     No deformities, masses, or tenderness noted. Lungs:     Normal respiratory effort, chest expands symmetrically. Lungs are clear to auscultation, no crackles or wheezes. Heart:     Normal rate and regular rhythm. S1 and S2 normal without gallop, murmur, click, rub or other extra sounds. Abdomen:     Bowel sounds positive,abdomen soft and non-tender without masses, organomegaly or hernias noted. Msk:     there is slight tenderness and soft tissue swelling involving his postero-medial thigh just above the knee.  There is slight ecchymosis.  Distal to this area    Impression & Recommendations:  Problem # 1:  HYPERTENSION (ICD-401.9)  His updated medication list for this problem includes:    Verapamil Hcl Cr 240 Mg Tbcr (Verapamil hcl) .[name]... Take 1 tablet by mouth at bedtime    Benazepril Hcl 40 Mg Tabs (Benazepril hcl) .[name]... 1 once  daily   Problem # 2:  MUSCLE STRAIN, HAMSTRING MUSCLE (ICD-844.8)  Complete Medication List: 1)  Verapamil Hcl Cr 240 Mg Tbcr (Verapamil hcl) .... Take 1 tablet by mouth at bedtime 2)  Benazepril Hcl 40 Mg Tabs (Benazepril hcl) .[name].. 1 once daily   Patient Instructions: 1)  Please schedule a follow-up appointment in 4 months. 2)  Limit your Sodium (Salt) to less than 2 grams a day(slightly less than 1/2 a teaspoon) to prevent fluid retention, swelling, or worsening of symptoms. 3)  It is important that you exercise regularly at least 20 minutes 5 times a week. If you develop chest pain, have severe difficulty breathing, or feel very tired , stop exercising immediately and seek medical attention.   Prescriptions: BENAZEPRIL HCL 40 MG  TABS (BENAZEPRIL HCL) 1 once daily  #90 x 0   Entered and Authorized by:   Gordy Savers  MD   Signed by:   Gordy Savers  MD on 03/02/2008   Method used:   Print then Give to Patient   RxID:   [id number] VERAPAMIL HCL CR 240 MG TBCR (VERAPAMIL HCL) Take 1 tablet by mouth at bedtime  #90 x 0   Entered and Authorized by:   Theron Arista  Lysle Dingwall  MD   Signed by:   Gordy Savers  MD on 03/02/2008   Method used:   Print then Give to Patient   RxID:   [id number]  ]

## 2010-09-17 NOTE — Assessment & Plan Note (Signed)
Summary: sinuses//ccm   Vital Signs:  Patient profile:   45 year old male Temp:     98.8 degrees F oral BP sitting:   160 / 110  (left arm) Cuff size:   large  Vitals Entered By: Sid Falcon LPN (November 22, 452 3:43 PM)  Serial Vital Signs/Assessments:  Time      Position  BP       Pulse  Resp  Temp     By                     148/98                         Evelena Peat MD  CC: Sinusitis   History of Present Illness: Acute visit. A one-week history of sinus congestive symptoms. Monday started Mucinex D with minimal relief. Upper back pain which he tends to get with sinus infections. Past few days productive cough. Intermittent facial pain. Yellowish nasal discharge. Intermittent headache. No fever. Allergy to Levaquin.  Allergies: 1)  ! * Ct Dye 2)  Levaquin (Levofloxacin)  Past History:  Past Medical History: Last updated: 01/22/2007 Hypertension Obesity Hypertrophic cardiomyopathy Diverticulitis, hx of PMH reviewed for relevance  Review of Systems      See HPI  Physical Exam  General:  Well-developed,well-nourished,in no acute distress; alert,appropriate and cooperative throughout examination Ears:  External ear exam shows no significant lesions or deformities.  Otoscopic examination reveals clear canals, tympanic membranes are intact bilaterally without bulging, retraction, inflammation or discharge. Hearing is grossly normal bilaterally. Nose:  erythematous nasal mucosa bilaterally with thick yellow mucous right naris Mouth:  Oral mucosa and oropharynx without lesions or exudates.  Teeth in good repair. Neck:  No deformities, masses, or tenderness noted. Lungs:  Normal respiratory effort, chest expands symmetrically. Lungs are clear to auscultation, no crackles or wheezes. Heart:  Normal rate and regular rhythm. S1 and S2 normal without gallop, murmur, click, rub or other extra sounds.   Impression & Recommendations:  Problem # 1:  SINUSITIS, ACUTE  (ICD-461.9)  His updated medication list for this problem includes:    Amoxicillin 875 Mg Tabs (Amoxicillin) ..... One by mouth two times a day for 10 days  Problem # 2:  HYPERTENSION (ICD-401.9) stop sudafed and monitor BP closely. His updated medication list for this problem includes:    Verapamil Hcl Cr 240 Mg Tbcr (Verapamil hcl) .[name]... Take 1 tablet by mouth at bedtime    Hydrochlorothiazide 25 Mg Tabs (Hydrochlorothiazide) .[name]... 1 once daily  Complete Medication List: 1)  Verapamil Hcl Cr 240 Mg Tbcr (Verapamil hcl) .... Take 1 tablet by mouth at bedtime 2)  Hydrochlorothiazide 25 Mg Tabs (Hydrochlorothiazide) .[name].. 1 once daily 3)  Amoxicillin 875 Mg Tabs (Amoxicillin) .... One by mouth two times a day for 10 days  Patient Instructions: 1)  Acute sinusitis symptoms for less than 10 days are not helped by antibiotics. Use warm moist compresses, and over the counter decongestants( only as directed). Call if no improvement in 5-7 days, sooner if increasing pain, fever, or new symptoms.  Prescriptions: AMOXICILLIN 875 MG TABS (AMOXICILLIN) one by mouth two times a day for 10 days  #20 x 0   Entered and Authorized by:   Evelena Peat MD   Signed by:   Evelena Peat MD on 11/22/2009   Method used:   Electronically to        [facility]* (retail)       [address] [place], [place]  [address]       Ph: [phone] or [phone]       Fax: [phone]   RxID:   [id number]

## 2010-09-17 NOTE — Assessment & Plan Note (Signed)
Summary: CPX/RCD/pt rsc/cjr   Vital Signs:  Patient profile:   45 year old male Weight:      336 pounds BMI:     42.15 BP sitting:   148 / 98  (left arm) Cuff size:   large  Vitals Entered By: Raechel Ache, RN (August 31, 2009 1:09 PM) CC: CPX, labs done.   CC:  CPX and labs done.[name]  History of Present Illness: 45 year old patient who is seen today for a wellness exam.  Medical problems include exogenous obesity.  He is status post lap band surgery in June of 2009.  He has a history of diverticular disease.  Hypertrophic arthropathy and treated hypertension.  He has lost over 100 pounds in weight since his surgery.  Preventive Screening-Counseling & Management  Caffeine-Diet-Exercise     Does Patient Exercise: yes  Allergies: 1)  ! * Ct Dye 2)  Levaquin (Levofloxacin)  Past History:  Past Medical History: Reviewed history from 01/22/2007 and no changes required. Hypertension Obesity Hypertrophic cardiomyopathy Diverticulitis, hx of  Past Surgical History: Reviewed history from 07/06/2008 and no changes required. Hiatal hernia repair Colectomy, sigmoid Laparotmy for intra-abdominal bleeding Lap-band surgery June 2009   Family History: Reviewed history and no changes required. Father age 69:   Mother:  HTN, palpitations, DM@ ?  1 brother 1 sister  Social History: Reviewed history and no changes required. Married Regular exercise-yes  treadmill Does Patient Exercise:  yes  Review of Systems  The patient denies anorexia, fever, weight loss, weight gain, vision loss, decreased hearing, hoarseness, chest pain, syncope, dyspnea on exertion, peripheral edema, prolonged cough, headaches, hemoptysis, abdominal pain, melena, hematochezia, severe indigestion/heartburn, hematuria, incontinence, genital sores, muscle weakness, suspicious skin lesions, transient blindness, difficulty walking, depression, unusual weight change, abnormal bleeding, enlarged lymph nodes,  angioedema, breast masses, and testicular masses.    Physical Exam  General:  overweight-appearing.  140/90overweight-appearing.   Head:  Normocephalic and atraumatic without obvious abnormalities. No apparent alopecia or balding. Eyes:  No corneal or conjunctival inflammation noted. EOMI. Perrla. Funduscopic exam benign, without hemorrhages, exudates or papilledema. Vision grossly normal. Ears:  External ear exam shows no significant lesions or deformities.  Otoscopic examination reveals clear canals, tympanic membranes are intact bilaterally without bulging, retraction, inflammation or discharge. Hearing is grossly normal bilaterally. Nose:  External nasal examination shows no deformity or inflammation. Nasal mucosa are pink and moist without lesions or exudates. Mouth:  Oral mucosa and oropharynx without lesions or exudates.  Teeth in good repair. Neck:  No deformities, masses, or tenderness noted. Chest Wall:  No deformities, masses, tenderness or gynecomastia noted. Breasts:  No masses or gynecomastia noted Lungs:  Normal respiratory effort, chest expands symmetrically. Lungs are clear to auscultation, no crackles or wheezes. Heart:  Normal rate and regular rhythm. S1 and S2 normal without gallop, murmur, click, rub or other extra sounds. Abdomen:  surgical scars noted.  No organomegaly Rectal:  No external abnormalities noted. Normal sphincter tone. No rectal masses or tenderness. Genitalia:  Testes bilaterally descended without nodularity, tenderness or masses. No scrotal masses or lesions. No penis lesions or urethral discharge. Prostate:  1+ enlarged.  1+ enlarged.   Msk:  No deformity or scoliosis noted of thoracic or lumbar spine.   Pulses:  R and L carotid,radial,femoral,dorsalis pedis and posterior tibial pulses are full and equal bilaterally Extremities:  No clubbing, cyanosis, edema, or deformity noted with normal full range of motion of all joints.   Neurologic:  alert &  oriented X3, strength normal in all extremities, sensation intact to light touch, and gait normal.  alert & oriented X3, strength normal in all extremities, sensation intact to light touch, and gait normal.   Skin:  Intact without suspicious lesions or rashes Cervical Nodes:  No lymphadenopathy noted Axillary Nodes:  No palpable lymphadenopathy Inguinal Nodes:  No significant adenopathy Psych:  Cognition and judgment appear intact. Alert and cooperative with normal attention span and concentration. No apparent delusions, illusions, hallucinations   Impression & Recommendations:  Problem # 1:  Preventive Health Care (ICD-V70.0)  Complete Medication List: 1)  Verapamil Hcl Cr 240 Mg Tbcr (Verapamil hcl) .... Take 1 tablet by mouth at bedtime 2)  Hydrochlorothiazide 25 Mg Tabs (Hydrochlorothiazide) .[name].. 1 once daily  Other Orders: Prescription Created Electronically [phone])  Patient Instructions: 1)  Please schedule a follow-up appointment in 4 months. 2)  Limit your Sodium (Salt). 3)  It is important that you exercise regularly at least 20 minutes 5 times a week. If you develop chest pain, have severe difficulty breathing, or feel very tired , stop exercising immediately and seek medical attention. 4)  You need to lose weight. Consider a lower calorie diet and regular exercise.  5)  Check your Blood Pressure regularly. If it is above: 150/90  you should make an appointment. Prescriptions: HYDROCHLOROTHIAZIDE 25 MG TABS (HYDROCHLOROTHIAZIDE) 1 once daily  #90 x 6   Entered and Authorized by:   Gordy Savers  MD   Signed by:   Gordy Savers  MD on 08/31/2009   Method used:   Print then Give to Patient   RxID:   [id number] VERAPAMIL HCL CR 240 MG TBCR (VERAPAMIL HCL) Take 1 tablet by mouth at bedtime  #90 x 6   Entered and Authorized by:   Gordy Savers  MD   Signed by:   Gordy Savers  MD on 08/31/2009   Method used:   Print then Give to Patient   RxID:    [id number] HYDROCHLOROTHIAZIDE 25 MG TABS (HYDROCHLOROTHIAZIDE) 1 once daily  #90 x 6   Entered and Authorized by:   Gordy Savers  MD   Signed by:   Gordy Savers  MD on 08/31/2009   Method used:   Electronically to        [facility]* (retail)       [address] [place], [place]  [address]       Ph: [phone] or [phone]       Fax: [phone]   RxID:   [id number] VERAPAMIL HCL CR 240 MG TBCR (VERAPAMIL HCL) Take 1 tablet by mouth at bedtime  #90 x 6   Entered and Authorized by:   Gordy Savers  MD   Signed by:   Gordy Savers  MD on 08/31/2009   Method used:   Electronically to        [facility]* (retail)       [address] [place], [place]  [address]       Ph: [phone] or [phone]       Fax: [phone]   RxID:   [id number]

## 2010-09-17 NOTE — Assessment & Plan Note (Signed)
Summary: 6 MONTH ROA/JLS/pt rescd from bump/ccm   Vital Signs:  Patient Profile:   45 Years Old Male Height:     75 inches (190.50 cm) Temp:     98.5 degrees F oral BP sitting:   128 / 82  (left arm) Cuff size:   regular  Vitals Entered By: Raechel Ache, RN (Jan 03, 2008 10:16 AM) Weight > 350 lbs. Yes                 Chief Complaint:  ROV. Having bariatric surgery June 5th..  History of Present Illness: 45 year old gentleman, history of hypertension, and exogenous obesity.  He is scheduled for bariatric surgery in a few weeks.  He is doing  well today without concerns or complaints.    Current Allergies: LEVAQUIN (LEVOFLOXACIN)  Past Medical History:    Reviewed history from 01/22/2007 and no changes required:       Hypertension       Obesity       Hypertrophic cardiomyopathy       Diverticulitis, hx of     Review of Systems  The patient denies anorexia, fever, weight loss, weight gain, vision loss, decreased hearing, hoarseness, chest pain, syncope, dyspnea on exhertion, peripheral edema, prolonged cough, hemoptysis, abdominal pain, melena, hematochezia, severe indigestion/heartburn, hematuria, incontinence, genital sores, muscle weakness, suspicious skin lesions, transient blindness, difficulty walking, depression, unusual weight change, abnormal bleeding, enlarged lymph nodes, angioedema, breast masses, and testicular masses.     Physical Exam  General:     obese;  blood pressure low normal Head:     Normocephalic and atraumatic without obvious abnormalities. No apparent alopecia or balding. Eyes:     No corneal or conjunctival inflammation noted. EOMI. Perrla. Funduscopic exam benign, without hemorrhages, exudates or papilledema. Vision grossly normal. Mouth:     Oral mucosa and oropharynx without lesions or exudates.  Teeth in good repair. Neck:     No deformities, masses, or tenderness noted. Lungs:     Normal respiratory effort, chest expands  symmetrically. Lungs are clear to auscultation, no crackles or wheezes. Heart:     Normal rate and regular rhythm. S1 and S2 normal without gallop, murmur, click, rub or other extra sounds. Abdomen:     obese, soft and nontender.  No organomegaly Extremities:     trace edema    Impression & Recommendations:  Problem # 1:  HYPERTENSION (ICD-401.9)  The following medications were removed from the medication list:    Hydrochlorothiazide 25 Mg Tabs (Hydrochlorothiazide) .[name]... Take 1 tablet by mouth every morning  His updated medication list for this problem includes:    Verapamil Hcl Cr 240 Mg Tbcr (Verapamil hcl) .[name]... Take 1 tablet by mouth at bedtime    Benazepril Hcl 40 Mg Tabs (Benazepril hcl) .[name]... 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 Take 1 tablet by mouth at bedtime 2)  Benazepril Hcl 40 Mg Tabs (Benazepril hcl) .[name].. 1 once daily   Patient Instructions: 1)  Please schedule a follow-up appointment in 3 months. 2)  Limit your Sodium (Salt). 3)  It is important that you exercise regularly at least 20 minutes 5 times a week. If you develop chest pain, have severe difficulty breathing, or feel very tired , stop exercising immediately and seek medical attention.   ]

## 2010-09-17 NOTE — Progress Notes (Signed)
Summary: refill  Phone Note Refill Request Message from:  Fax from Pharmacy on March 16, 2009 12:27 PM  Refills Requested: Medication #1:  VERAPAMIL HCL CR 240 MG TBCR Take 1 tablet by mouth at bedtime Initial call taken by: Kern Reap CMA,  March 16, 2009 12:27 PM    Prescriptions: VERAPAMIL HCL CR 240 MG TBCR (VERAPAMIL HCL) Take 1 tablet by mouth at bedtime  #90 x 6   Entered by:   Kern Reap CMA   Authorized by:   Gordy Savers  MD   Signed by:   Kern Reap CMA on 03/16/2009   Method used:   Electronically to        [facility]* (retail)       [address] [place], [place]  [address]       Ph: [phone] or [phone]       Fax: [phone]   RxID:   [id number]

## 2010-09-17 NOTE — Assessment & Plan Note (Signed)
Summary: elevated BP/dm   Vital Signs:  Patient profile:   45 year old male Temp:     98.4 degrees F oral BP sitting:   138 / 82  (left arm) Cuff size:   large  Vitals Entered By: Raechel Ache, RN (Dec 22, 2008 2:56 PM)  CC:  Referred by dentist; was supposed to have 4 teeth pulled today and BP was high.  History of Present Illness: 45 year old patient seen today for follow-up of his hypertension.  He was scheduled for elective wisdom teeth extraction today, but noted to have mild high blood pressure and the procedure was rescheduled.  His blood pressure in general has done well.  He has been compliant with his triple antihypertensive regimen.  He is exercising regularly and losing weight.  He has a history of morbid obesity, as well as cardiomyopathy; the latter has been stable.  Hypertension History:      Positive major cardiovascular risk factors include hypertension.  Negative major cardiovascular risk factors include male age less than 41 years old and non-tobacco-user status.     Problems Prior to Update: 1)  Muscle Strain, Hamstring Muscle  (ICD-844.8) 2)  Bronchitis, Acute  (ICD-466.0) 3)  Abdominal Mass, Right Lower Quadrant  (ICD-789.33) 4)  Diverticulitis, Hx of  (ICD-V12.79) 5)  Obesity, Morbid  (ICD-278.01) 6)  Cardiomyopathy, Hypertrophic  (ICD-425.1) 7)  Hypertension  (ICD-401.9)  Medications Prior to Update: 1)  Verapamil Hcl Cr 240 Mg Tbcr (Verapamil Hcl) .... Take 1 Tablet By Mouth At Bedtime 2)  Hydrochlorothiazide 25 Mg Tabs (Hydrochlorothiazide) .[name].. 1 Once Daily  Allergies: 1)  ! * Ct Dye 2)  Levaquin (Levofloxacin)  Physical Exam  General:  overweight-appearing.  136/78 to 82 Head:  Normocephalic and atraumatic without obvious abnormalities. No apparent alopecia or balding. Eyes:  No corneal or conjunctival inflammation noted. EOMI. Perrla. Funduscopic exam benign, without hemorrhages, exudates or papilledema. Vision grossly normal. Mouth:  Oral  mucosa and oropharynx without lesions or exudates.  Teeth in good repair. Lungs:  Normal respiratory effort, chest expands symmetrically. Lungs are clear to auscultation, no crackles or wheezes. Heart:  Normal rate and regular rhythm. S1 and S2 normal without gallop, murmur, click, rub or other extra sounds. Abdomen:  Bowel sounds positive,abdomen soft and non-tender without masses, organomegaly or hernias noted.   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 Take 1 tablet by mouth at bedtime    Hydrochlorothiazide 25 Mg Tabs (Hydrochlorothiazide) .[name]... 1 once daily  Problem # 3:  CARDIOMYOPATHY, HYPERTROPHIC (ICD-425.1)  Complete Medication List: 1)  Verapamil Hcl Cr 240 Mg Tbcr (Verapamil hcl) .... Take 1 tablet by mouth at bedtime 2)  Hydrochlorothiazide 25 Mg Tabs (Hydrochlorothiazide) .[name].. 1 once daily  Hypertension Assessment/Plan:      The patient's hypertensive risk group is category A: No risk factors and no target organ damage.  Today's blood pressure is 138/82.    Patient Instructions: 1)  Please schedule a follow-up appointment in 4 months. 2)  Limit your Sodium (Salt). 3)  It is important that you exercise regularly at least 20 minutes 5 times a week. If you develop chest pain, have severe difficulty breathing, or feel very tired , stop exercising immediately and seek medical attention. 4)  You need to lose weight. Consider a lower calorie diet  and regular exercise.

## 2010-09-17 NOTE — Letter (Signed)
Summary: *Referral Letter  [place] [facility]  [address] [place], [place] [address]   Phone: [phone]  Fax: [phone]    05/20/2007  Thank you in advance for agreeing to see my patient:  Chad Lyons [address] 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) 4)  CARDIOMYOPATHY, HYPERTROPHIC (ICD-425.1) 5)  HYPERTENSION (ICD-401.9)   Current Medications: 1)  HYDROCHLOROTHIAZIDE 25 MG TABS (HYDROCHLOROTHIAZIDE) Take 1 tablet by mouth every morning 2)  LEVAQUIN 500 MG TABS (LEVOFLOXACIN) Take 1 tablet by mouth once a day 3)  VERAPAMIL HCL CR 240 MG TBCR (VERAPAMIL HCL) Take 1 tablet by mouth at bedtime 4)  HYDROCHLOROTHIAZIDE 25 MG TABS (HYDROCHLOROTHIAZIDE) Take 1 tablet by mouth every morning 5)  LEVAQUIN 500 MG TABS (LEVOFLOXACIN) Take 1 tablet by mouth once a day 6)  VERAPAMIL HCL CR 240 MG TBCR (VERAPAMIL HCL) Take 1 tablet by mouth at bedtime   Past Medical History: 1)  Hypertension 2)  Obesity 3)  Hypertrophic cardiomyopathy 4)  Diverticulitis, hx of   Prior History of Blood Transfusions:   Pertinent Labs:   Chad Lyons had made numerous attempts at weight loss with modest and temporary gains.  Drug therapy has not felt to be appropriate to to co-morbidities and unclear long term benefits.      Thank you again for agreeing to see our patient; please contact us if you have any further questions or need additional information.  Sincerely,  Gordy Savers  MD

## 2010-09-17 NOTE — Progress Notes (Signed)
Summary: NEEDS RENEWAL OF BENAZEPRIL  Phone Note Call from Patient Call back at [phone]   Caller: Patient-live call Reason for Call: Talk to Nurse Summary of Call: PT NEEDS NEW RX BENAZEPRIL 20. PT SAID THAT PHARMACY DENIED REFILL. PLEASE ADVISE. CALL [facility]. Initial call taken by: Warnell Forester,  June 13, 2009 4:44 PM  Follow-up for Phone Call        not on his med list- he though he was supposed to be on it. ??? Follow-up by: Raechel Ache, RN,  June 14, 2009 9:49 AM    New/Updated Medications: BENAZEPRIL HCL 20 MG TABS (BENAZEPRIL HCL) 1 once daily Prescriptions: BENAZEPRIL HCL 20 MG TABS (BENAZEPRIL HCL) 1 once daily  #90 x 0   Entered by:   Raechel Ache, RN   Authorized by:   Gordy Savers  MD   Signed by:   Raechel Ache, RN on 06/14/2009   Method used:   Electronically to        [facility]* (retail)       [address] [place], [place]  [address]       Ph: [phone] or [phone]       Fax: [phone]   RxID:   [id number]  Number 90;  Needs a follow-up office visit

## 2010-09-17 NOTE — Assessment & Plan Note (Signed)
Summary: lung congestion/mhf   Vital Signs:  Patient Profile:   45 Years Old Male Height:     75 inches (190.50 cm) O2 Sat:      98 % Temp:     98.3 degrees F oral BP sitting:   148 / 88  (left arm)  Vitals Entered By: Raechel Ache, RN (July 21, 2007 9:52 AM) Weight > 350 lbs. Yes Oxygen therapy Room Air                 Chief Complaint:  C/o cold and productive cough x 6 wks; unable to rest. Wheezing.[name]  History of Present Illness: a 45 year old, black gentleman, history of hypertension, and complex diverticular disease, as well as exogenous obesity.  He presented with a 7 week  history of refractory cough.  This has interfered with sleep, and has been productive of yellow sputum.  He has tried a number of OTC medications with little improvement  Current Allergies: LEVAQUIN (LEVOFLOXACIN)      Physical Exam  General:     Well-developed,well-nourished,in no acute distress; alert,appropriate and cooperative throughout examination Head:     Normocephalic and atraumatic without obvious abnormalities. No apparent alopecia or balding. Eyes:     No corneal or conjunctival inflammation noted. EOMI. Perrla. Funduscopic exam benign, without hemorrhages, exudates or papilledema. Vision grossly normal. Mouth:     Oral mucosa and oropharynx without lesions or exudates.  Teeth in good repair. Neck:     No deformities, masses, or tenderness noted. Lungs:     scatter rhonchi, right greater than the left Heart:     Normal rate and regular rhythm. S1 and S2 normal without gallop, murmur, click, rub or other extra sounds.    Impression & Recommendations:  Problem # 1:  HYPERTENSION (ICD-401.9)  The following medications were removed from the medication list:    Verapamil Hcl Cr 240 Mg Tbcr (Verapamil hcl) .[name]... Take 1 1/2 once daily    Hydrochlorothiazide 25 Mg Tabs (Hydrochlorothiazide) .[name]... Take 1 tablet by mouth every morning  His updated medication list for this  problem includes:    Hydrochlorothiazide 25 Mg Tabs (Hydrochlorothiazide) .[name]... Take 1 tablet by mouth every morning    Verapamil Hcl Cr 240 Mg Tbcr (Verapamil hcl) .[name]... Take 1 tablet by mouth at bedtime    Benazepril Hcl 40 Mg Tabs (Benazepril hcl) .[name]... 1 once daily   Problem # 2:  BRONCHITIS, ACUTE (ICD-466.0)  The following medications were removed from the medication list:    Levaquin 500 Mg Tabs (Levofloxacin) .[name]... Take 1 tablet by mouth once a day    Levaquin 500 Mg Tabs (Levofloxacin) .[name]... Take 1 tablet by mouth once a day  His updated medication list for this problem includes:    Zithromax Z-pak 250 Mg Tabs (Azithromycin)   Complete Medication List: 1)  Hydrochlorothiazide 25 Mg Tabs (Hydrochlorothiazide) .... Take 1 tablet by mouth every morning 2)  Verapamil Hcl Cr 240 Mg Tbcr (Verapamil hcl) .... Take 1 tablet by mouth at bedtime 3)  Benazepril Hcl 40 Mg Tabs (Benazepril hcl) .[name].. 1 once daily 4)  Zithromax Z-pak 250 Mg Tabs (Azithromycin)   Patient Instructions: 1)  Drink as much fluid as you can tolerate for the next few days. 2)  Please schedule a follow-up appointment in 6 months.    Prescriptions: ZITHROMAX Z-PAK 250 MG  TABS (AZITHROMYCIN)   #5 tabs x 0   Entered and Authorized by:   Gordy Savers  MD  Signed by:   Gordy Savers  MD on 07/21/2007   Method used:   Print then Give to Patient   RxID:   [id number] BENAZEPRIL HCL 40 MG  TABS (BENAZEPRIL HCL) 1 once daily  #90 x 0   Entered and Authorized by:   Gordy Savers  MD   Signed by:   Gordy Savers  MD on 07/21/2007   Method used:   Print then Give to Patient   RxID:   [id number] VERAPAMIL HCL CR 240 MG TBCR (VERAPAMIL HCL) Take 1 tablet by mouth at bedtime  #90 x 0   Entered and Authorized by:   Gordy Savers  MD   Signed by:   Gordy Savers  MD on 07/21/2007   Method used:   Print then Give to Patient   RxID:   [id number] HYDROCHLOROTHIAZIDE  25 MG TABS (HYDROCHLOROTHIAZIDE) Take 1 tablet by mouth every morning  #90 x 5   Entered and Authorized by:   Gordy Savers  MD   Signed by:   Gordy Savers  MD on 07/21/2007   Method used:   Print then Give to Patient   RxID:   [id number]  ]

## 2010-09-17 NOTE — Letter (Signed)
Summary: Generic Letter  [place] at [facility]  [address] [place], [place] [address]   Phone: [phone]  Fax: [phone]    01/08/2009  Christapher Mcfadden [address] [place], [place]  [address]  Dear Milford Cage:  Mr. Obey was evaluated in our office recently for hypertension.  His blood pressure is presently adequately controlled and there are no contraindications to the anticipated oral surgery.           Sincerely,   Eleonore Chiquito  MD

## 2010-09-17 NOTE — Letter (Signed)
Summary: [facility] Provider: This provider was preselected by the workflow.  Signature: The signature status of this document was preset by the workflow  Processed by [employer] Local Indexer Client @ Wednesday, August 08, 2009 10:34:01 AM using version:2010.1.2.11(2.4)   Manually Indexed By: [id number]  idlBatchDetail: [id number]   _____________________________________________________________________  External Attachment:    Type:   Image     Comment:   External Document

## 2010-09-17 NOTE — Assessment & Plan Note (Signed)
Summary: 4 month rov/njr pt rsc/njr   Vital Signs:  Patient profile:   45 year old male Weight:      320 pounds Temp:     98.1 degrees F oral BP sitting:   160 / 120  (right arm) Cuff size:   large  Vitals Entered By: Duard Brady LPN (Dec 27, 2009 10:27 AM) CC: 4 mos rov - doing well Is Patient Diabetic? No   CC:  4 mos rov - doing well.  History of Present Illness: 56 -year-old patient who is seen today for follow-up of his hypertension.  He was seen by general surgery recently with a normal blood pressure.  Blood pressure and is at home are usually high normal to mildly elevated.  He was seen here one month ago with a moderately high blood pressure reading. He describes considerable situational stress with work and domestic issues.  He continues to lose weight nicely  Preventive Screening-Counseling & Management  Alcohol-Tobacco     Smoking Status: quit  Allergies: 1)  ! * Ct Dye 2)  Levaquin (Levofloxacin)  Past History:  Past Medical History: Hypertension Obesity Hypertrophic cardiomyopathy Diverticulitis, hx of Anxiety  Review of Systems       The patient complains of weight loss.  The patient denies anorexia, fever, vision loss, decreased hearing, hoarseness, chest pain, syncope, dyspnea on exertion, peripheral edema, prolonged cough, headaches, hemoptysis, abdominal pain, melena, hematochezia, severe indigestion/heartburn, hematuria, incontinence, genital sores, muscle weakness, suspicious skin lesions, transient blindness, difficulty walking, depression, unusual weight change, abnormal bleeding, enlarged lymph nodes, angioedema, breast masses, and testicular masses.    Physical Exam  General:  overweight-appearing.  160/100overweight-appearing.   Head:  Normocephalic and atraumatic without obvious abnormalities. No apparent alopecia or balding. Eyes:  No corneal or conjunctival inflammation noted. EOMI. Perrla. Funduscopic exam benign, without  hemorrhages, exudates or papilledema. Vision grossly normal. Mouth:  Oral mucosa and oropharynx without lesions or exudates.  Teeth in good repair. Neck:  No deformities, masses, or tenderness noted. Lungs:  Normal respiratory effort, chest expands symmetrically. Lungs are clear to auscultation, no crackles or wheezes. Heart:  Normal rate and regular rhythm. S1 and S2 normal without gallop, murmur, click, rub or other extra sounds. Abdomen:  Bowel sounds positive,abdomen soft and non-tender without masses, organomegaly or hernias noted.   Impression & Recommendations:  Problem # 1:  ANXIETY (ICD-300.00)  His updated medication list for this problem includes:    Sertraline Hcl 50 Mg Tabs (Sertraline hcl) ..... One every morning    Alprazolam 0.5 Mg Tabs (Alprazolam) ..... One twice daily as needed for anxiety  His updated medication list for this problem includes:    Sertraline Hcl 50 Mg Tabs (Sertraline hcl) ..... One every morning    Alprazolam 0.5 Mg Tabs (Alprazolam) ..... One twice daily as needed for anxiety  Problem # 2:  HYPERTENSION (ICD-401.9)  The following medications were removed from the medication list:    Hydrochlorothiazide 25 Mg Tabs (Hydrochlorothiazide) .[name]... 1 once daily His updated medication list for this problem includes:    Verapamil Hcl Cr 240 Mg Tbcr (Verapamil hcl) .[name]... Take 1 tablet by mouth at bedtime    Lisinopril-hydrochlorothiazide 20-25 Mg Tabs (Lisinopril-hydrochlorothiazide) ..... One daily  The following medications were removed from the medication list:    Hydrochlorothiazide 25 Mg Tabs (Hydrochlorothiazide) .[name]... 1 once daily His updated medication list for this problem includes:    Verapamil Hcl Cr 240 Mg Tbcr (Verapamil hcl) .[name]... Take 1 tablet by mouth  at bedtime    Lisinopril-hydrochlorothiazide 20-25 Mg Tabs (Lisinopril-hydrochlorothiazide) ..... One daily  Complete Medication List: 1)  Verapamil Hcl Cr 240 Mg Tbcr (Verapamil hcl) ....  Take 1 tablet by mouth at bedtime 2)  Sertraline Hcl 50 Mg Tabs (Sertraline hcl) .... One every morning 3)  Alprazolam 0.5 Mg Tabs (Alprazolam) .... One twice daily as needed for anxiety 4)  Lisinopril-hydrochlorothiazide 20-25 Mg Tabs (Lisinopril-hydrochlorothiazide) .... One daily  Patient Instructions: 1)  Please schedule a follow-up appointment in 6 weeks 2)  Limit your Sodium (Salt). 3)  It is important that you exercise regularly at least 20 minutes 5 times a week. If you develop chest pain, have severe difficulty breathing, or feel very tired , stop exercising immediately and seek medical attention. 4)  You need to lose weight. Consider a lower calorie diet and regular exercise.  Prescriptions: LISINOPRIL-HYDROCHLOROTHIAZIDE 20-25 MG TABS (LISINOPRIL-HYDROCHLOROTHIAZIDE) one daily  #90 x 4   Entered and Authorized by:   Gordy Savers  MD   Signed by:   Gordy Savers  MD on 12/27/2009   Method used:   Print then Give to Patient   RxID:   [id number] ALPRAZOLAM 0.5 MG TABS (ALPRAZOLAM) one twice daily as needed for anxiety  #90 x 0   Entered and Authorized by:   Gordy Savers  MD   Signed by:   Gordy Savers  MD on 12/27/2009   Method used:   Print then Give to Patient   RxID:   [id number] SERTRALINE HCL 50 MG TABS (SERTRALINE HCL) one every morning  #90 x 4   Entered and Authorized by:   Gordy Savers  MD   Signed by:   Gordy Savers  MD on 12/27/2009   Method used:   Print then Give to Patient   RxID:   [id number]

## 2010-10-11 ENCOUNTER — Ambulatory Visit (INDEPENDENT_AMBULATORY_CARE_PROVIDER_SITE_OTHER): Payer: BC Managed Care – PPO | Admitting: Internal Medicine

## 2010-10-11 ENCOUNTER — Encounter: Payer: Self-pay | Admitting: Internal Medicine

## 2010-10-11 DIAGNOSIS — J069 Acute upper respiratory infection, unspecified: Secondary | ICD-10-CM

## 2010-10-11 MED ORDER — AZITHROMYCIN 250 MG PO TABS: ORAL_TABLET | ORAL | Status: AC

## 2010-10-11 MED ORDER — CHLORPHENIRAMINE-HYDROCODONE 8-10 MG/5ML PO LQCR: 5.0000 mL | Freq: Two times a day (BID) | ORAL | Status: DC | PRN

## 2010-10-11 NOTE — Progress Notes (Signed)
  Subjective:    Patient ID: Chad Lyons, male    DOB: 1965/08/19, 45 y.o.   MRN: [id number]  HPI  Pt presents to clinic for evaluation of URI sx's. Notes 1 week h/o initially nasal congestion, nasal drainage and ST now progressing to cough with associated f/c. +sick exposures. Taking otc cold medication prn without significant improvement. No alleviating or exacerbating factors. Denies wheezing or dyspnea. No other complaints.  Reviewed PMH, medications and allergies.    Review of Systems  Constitutional: Positive for fever and chills.  HENT: Positive for ear pain, congestion, rhinorrhea and ear discharge. Negative for hearing loss.   Eyes: Negative for discharge and redness.  Respiratory: Positive for cough and shortness of breath. Negative for wheezing.   Musculoskeletal: Positive for myalgias.       Objective:   Physical Exam  Constitutional: He appears well-developed and well-nourished. No distress.  HENT:  Head: Normocephalic and atraumatic.  Right Ear: Tympanic membrane, external ear and ear canal normal.  Left Ear: External ear and ear canal normal. No drainage. No foreign bodies. Tympanic membrane is erythematous. Tympanic membrane is not perforated, not retracted and not bulging.  Nose: Nose normal.  Mouth/Throat: Oropharynx is clear and moist. No oropharyngeal exudate.  Eyes: Conjunctivae are normal. Right eye exhibits no discharge. Left eye exhibits no discharge. No scleral icterus.  Neck: Neck supple.  Pulmonary/Chest: Effort normal and breath sounds normal. No respiratory distress. He has no wheezes. He has no rales.  Lymphadenopathy:    He has no cervical adenopathy.  Neurological: He is alert.  Skin: Skin is warm and dry. No rash noted. He is not diaphoretic. No erythema.  Psychiatric: He has a normal mood and affect.          Assessment & Plan:

## 2010-10-11 NOTE — Assessment & Plan Note (Signed)
Given persistence of sx's and possible left OM will begin abx tx. Tussionex prn cough and cautioned re: possible sedating effect. Followup if no improvement or worsening.

## 2010-12-20 ENCOUNTER — Ambulatory Visit: Payer: Self-pay | Admitting: Internal Medicine

## 2010-12-31 NOTE — Op Note (Signed)
NAMEDAXSON, [name]                 ACCOUNT NO.:  [id number]   MEDICAL RECORD NO.:  [id number]          PATIENT TYPE:  OIB   LOCATION:  0098                         FACILITY:  [facility]   PHYSICIAN:  Chad Lyons, M.D.DATE OF BIRTH:  12-21-1965   DATE OF PROCEDURE:  01/21/2008  DATE OF DISCHARGE:                               OPERATIVE REPORT   PREOPERATIVE DIAGNOSIS:  Morbid obesity.   POSTOPERATIVE DIAGNOSIS:  1. Morbid obesity.  2. Extensive intra-abdominal adhesions.   SURGICAL PROCEDURE:  Placement of laparoscopic adjustable gastric band.   SURGEON:  Lorne Skeens. Lyons, M.D.   ASSISTANT:  Alfonse Ras, MD   ANESTHESIA:  General.   BRIEF HISTORY:  Chad Lyons is a 45 year old male with a long history of  progressive, morbid obesity, unresponsive to efforts at medical  management.  Following a detailed preoperative assessment and  discussion, recorded elsewhere, we have elected to proceed with  placement of laparoscopic adjustable gastric band.  The patient has had  extensive previous abdominal surgery, including repair of a large upper  abdominal primary ventral hernia, as well as colectomy and treatment for  a postoperative colocutaneous fistula.  We have discussed that adhesions  may be the chief barrier to a successful procedure.   DESCRIPTION OF OPERATION:  The patient was brought to the operating  room, placed in supine position on the operating table, and general  endotracheal anesthesia was induced.  He received preoperative IV  antibiotics.  Heparin had been administered subcutaneously.  PAS were in  place.  The abdomen was widely, sterilely prepped and draped.  Correct  patient and procedure were verified.  Initial access was obtained in the  left subcostal space, midclavicular line with am 11 mm OptiVu trocar.  The peritoneal cavity was able to be well seen and entered with the  OptiVu, but on entering there were extensive omental adhesions in all  directions, as well as adhesions of the liver up to the anterior  abdominal wall.  These were taken down somewhat with blunt dissection  using the camera through this trocar, but I really could not develop  enough working room to progress any further.  Therefore, we went over to  the right upper quadrant and in the subcostal space, a little lateral to  the midclavicular line, another 11 mm OptiVu was placed, again having  good vision into the peritoneal cavity.  There were also extensive  adhesions in the right upper quadrant, however, I was able to develop  some working room with careful blunt dissection with the camera, and  then was able to place a 5 mm trocar more laterally in the abdominal  wall, on the right side for adhesiolysis.  Using the harmonic scalpel,  then an extensive adhesiolysis was performed, beginning on the right  side of the abdomen, working toward the left, and working inferior to  superior.  The adhesions were chiefly omentum, up to the previous  midline hernia repair.  There was a small, clear space between the lower  end of his upper abdominal adhesions,  and them the more caudad  pelvic  adhesions from his colectomy, which were left undisturbed.  Extensive  omental adhesions were taken down from the anterior abdominal wall and  there were a few Swiss cheese defects in the upper midline, and omentum  was reduced from these and dissected posteriorly.  I took down the  falciform ligament to allow better exposure of the liver and there were  fairly extensive adhesions of the liver up to the anterior abdominal  wall, which were carefully taken down with harmonic scalpel.  Once the  anterior abdominal wall was cleared, I was then able to put a Nathanson  retractor through the 5 mm epigastric site, and then partially lift the  right lobe of liver up.  There were adhesions all along the inferior  edge of the liver,  down to the stomach and omentum, but more up under  the  liver was free.  The inferior edge of liver was carefully freed with  the harmonic scalpel,  until I was able to get the [facility] retractor  up under the right lobe and elevated.  The liver was floppy and long,  but not that thick or rigid, and we were able to then get good exposure  of the upper stomach and hiatus.  An additional 11 mm trocar was placed,  just off the right of the midline.  Some further omental adhesions were  taken down from the left flank, and an additional 5 mm trocar placed  here.  At this point, we had spent approximately an hour and a half on  adhesiolysis, which was really the majority of time of the procedure.  The hiatus was then exposed and the peritoneum overlying the left crus  was incised and careful. blunt dissection carried back down toward the  retrogastric area with the finger dissector.  The gastrohepatic omentum  was opened with an  harmonic scalpel, and the base of the right crus  identified at the area of crossing fat.  The peritoneum was incised just  anterior to the right crus.  Using the finger dissector, careful blunt  dissection was carried back behind the stomach without difficulty and  the finger dissector deployed up through the previously dissected area  at the angle of HIS.  A flushed AP large band system was introduced  through 1 of the trocar sites.  The trocar replaced.  The tubing was  placed into the finger dissector, and then brought back behind the  stomach, and the band was able to be brought back behind the stomach  without difficulty.  The sizing tube was then placed orally into the  stomach and the band was snapped into place without any unusual tension.  Holding the tubing inferiorly toward the patient's feet, the fundus was  then imbricated up over the band to the small gastric pouch, with 4  interrupted 2-0 Ethibond sutures.  The band appeared to be in excellent  position.  The abdomen was then carefully inspected for  hemostasis and  any evidence of trocar or bowel injury from the adhesiolysis, and  everything looked fine.  The tubing was brought out through the right  upper quadrant trocar site.  The [facility] retractor was removed under  direct vision, and all CO2 evacuated and trocars removed.  Right upper  quadrant incision was lengthened slightly, the anterior fascia exposed.  Four 2-0 Prolene sutures were placed in the anterior fascia.  The tubing  was cut, the port attached, and the port secured to the fascia with  the  previously placed sutures.  The tubing was seen to curve smoothly into  the abdomen.  The wounds were irrigated with saline.  The port site was  closed with subcutaneous running 3-0 Vicryl and all skin closed with  staples.  Sponge and needle counts correct.  The patient was taken to  recovery in good condition.      Lorne Skeens. Lyons, M.D.  Electronically Signed     BTH/MEDQ  D:  01/21/2008  T:  01/21/2008  Job:  [id number]

## 2011-01-03 NOTE — Discharge Summary (Signed)
[name], [name]                 ACCOUNT NO.:  [id number]   MEDICAL RECORD NO.:  [id number]          PATIENT TYPE:  INP   LOCATION:  5738                         FACILITY:  [facility]   PHYSICIAN:  Sharlet Salina T. Hoxworth, M.D.DATE OF BIRTH:  08/11/1966   DATE OF ADMISSION:  04/29/2006  DATE OF DISCHARGE:  05/21/2006                               DISCHARGE SUMMARY   DISCHARGE DIAGNOSES:  1. Diverticulitis, sigmoid colon.  2. Postoperative bleed.  3. Postoperative enteric fistula.   OPERATIONS AND PROCEDURES:  Sigmoid colectomy with anastomosis on  April 29, 2006, Dr. Johna Sheriff; laparotomy for intra-abdominal  bleeding April 30, 2006, Dr. Johna Sheriff.   HISTORY OF PRESENT ILLNESS:  Chad Lyons is a 45 year old male  patient of Dr. Sheryn Bison and Dr. Eleonore Lyons with recurrent  episodes of diverticulitis.  These began approximately February 2007,  when he was hospitalized with acute left lower quadrant pain and fever.  CT scan showed acute diverticulitis and peridiverticular abscess.  He  was treated with percutaneous drainage and antibiotics with resolution.  He subsequently has had at least two other acute episodes of pain and  fever treated with outpatient antibiotics with resolution.  He  continues, however, to have intermittent pain.  He has pain with  urination.  Bowel movements have been okay.  He has had a colonoscopy by  Dr. Jarold Motto following this first episode which showed diverticulosis  of the sigmoid colon.  The patient, after extensive discussion and  workup as an outpatient, is admitted for elective sigmoid colectomy for  recurrent episodes of diverticulitis.   PAST MEDICAL HISTORY:  1. The patient had hiatal hernia repair by Dr. Chelsea Aus in 1992      apparently with acute incarceration at that time.  2. Medically he is followed for hypertension.  3. History of mild elevated LFTs felt secondary to hepatitis.  4. Also followed for morbid  obesity.  5. 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   MEDICATIONS:  1. Verapamil 240 SR daily.  2. Benazepril 20 mg daily.  3. Hydrochlorothiazide 25 mg daily.   ALLERGIES:  CONTRAST DYE.   For social history, family history and review of systems see detailed  H&P.   PHYSICAL EXAMINATION:  VITAL SIGNS:  He is 6 feet 3 inches, 180 pounds.  Vital signs within normal limits.  GENERAL:  Morbidly obese black male in no acute distress.  PERTINENT FINDINGS:  Limited abdomen showing obesity healed upper  midline incision soft and nontender.   HOSPITAL COURSE:  Following mechanical antibiotic bowel prep at home,  the patient was admitted for his procedure and underwent sigmoid  colectomy with primary anastomosis on April 29, 2006, that was  apparently uncomplicated with a segmental episode of severe sigmoid  diverticulitis.  Pathology did reveal sigmoid diverticulitis with  peridiverticular abscess.  The patient, early in  the morning of the  first postoperative day, was noted to be hypotensive with blood pressure  drop to 75/45 and decreased urine output.  The patient was transferred  to the ICU, treated with fluid resuscitation and had increase in his  blood  pressure.  Lab at that time, however, showed increased potassium  to 6.2, creatinine had jumped to 2.9.  Hemoglobin had dropped to 10.2  for 13.7 preop.  He is treated with aggressive fluid resuscitation with  initial stabilization of his blood pressure but then had another episode  of hypotension down to blood pressure of 85 systolic and elected to  return the patient to the operating room for immediate exploration.  At  that time he was found to have significant hemoperitoneum which was  evacuated.  There was no particularly impressive source of the bleeding  located with several small oozing areas along the pelvis and mesentery  cauterized and the abdomen was washed out and hemostasis obtained.  On  the following  day, his blood pressure and pulse were normal.  Creatinine  was down to 1.8.  Hemoglobin was 8.3.  The patient did receive two  further units of blood, a total 4 units transfusion.  He did have some  fever on the third postoperative day up to 102.  The white count,  however, was normal at 8.5 and abdomen was nontender.  He began having  flatus and bowel movements on the fourth postoperative day.  He did have  some low grade fever and then again on the sixth postoperative day and  seventh postoperative day had fever to 101 and 102.  Wound had some  slight serous drainage but no clear infection at this point.  He was  restarted on antibiotics, Invanz.  White count was 9.9000 and he  appeared well.   On the eighth postoperative day he was afebrile, having normal bowel  movements, tolerating full liquid diet and diet was advanced.  He had an  episode of nausea and vomiting on the eighth postoperative day but this  quickly resolved.  Abdominal x-ray showed a possible ileus versus  partial small bowel obstruction.  At this point, he did develop some  induration around his pannus at the midline incision and the wound was  opened with some cloudy purulent drainage and this was packed and he  remained on antibiotics.  The following day on May 10, 2006, he  was afebrile.  Wound was cleaning up nicely with moist saline gauze  dressing changes.  However, on May 11, 2006, I noted definite  enteric content at the base of the wound at the wound change.  CT scan  of the abdomen was obtained which showed some mildly dilated small bowel  consistent with partial small-bowel obstruction and an apparent  enterocutaneous fistula.  At this point, the patient was nearly 2 weeks  postoperative with two abdominal explorations and appeared quite stable  with a normal white count and a nontender abdomen.  Therefore, elected to treat his fistula nonoperatively as it appeared to have a fairly low  output  and I felt that the risk of re-explored abdomen at this stage was  quite high in regards to further bowel injury.  A VAC dressing was  placed on the wound with good control of the fistula drainage which was  about 100-150 mL per day.  He was placed on a low residue diet.  He was  continued on IV antibiotics, however, continued have some spiking fevers  up to 103 degrees although he felt well, had a nontender abdomen and his  wound showed no erythema or induration.  At this point, I felt that this  may be a drug fever and his antibiotics were discontinued and his fever  promptly dissipated.  He remained afebrile from this point on.  The VAC  wound dressing controlled the fistula well, though the output did  increase at one point up to 500 mL per day.  I therefore elected placed  the patient on TNA and bowel rest.  A PICC line was started and TNA  started on May 19, 2006.  He continued to feel essentially well with  a nontender abdomen.  We therefore elected to treat him as an outpatient  with home TNA and VAC drainage with an attempt to allow this to close  nonoperatively.  Arrangements were made with home health.  The patient  was discharged home on May 21, 2006.  He will receive cyclical TNA  at home via his PICC line and home health for [facility] wound drainage  changes every 3 days.  His abdomen is soft and nontender.  There is no  open area approximately 8 x 5 cm with fistula present at the bottom of  the wound.  There is good granulation tissue and wound appears to be  starting to close.  Follow-up is to be in my office in 1-2 weeks.      Lorne Skeens. Hoxworth, M.D.  Electronically Signed     BTH/MEDQ  D:  06/08/2006  T:  06/09/2006  Job:  [id number]

## 2011-01-03 NOTE — H&P (Signed)
[name], Lyons                 ACCOUNT NO.:  [id number]   MEDICAL RECORD NO.:  [id number]          PATIENT TYPE:  INP   LOCATION:  1510                         [facility]ACILITY:  [facility]   PHYSICIAN:  Vania Rea. Jarold Motto, M.D. [facility] OF BIRTH:  April 07, 1966   DATE OF ADMISSION:  02/17/2006  DATE OF DISCHARGE:                                HISTORY & PHYSICAL   CHIEF COMPLAINT:  Lower abdominal pain and fever.   HISTORY:  Chad Lyons is a pleasant 45 year old African-American male with a  history of hypertension, nonischemic cardiomyopathy and steatohepatitis.  He  is a primary patient of Dr. Vernon Prey.  The patient was hospitalized  with an episode of diverticulitis in February of 2007 on the medicine  service.  This was apparently complicated by a pelvic abscess and required  percutaneous drainage.  The patient then underwent colonoscopy here with Dr.  Jarold Motto on December 10, 2005 which showed diverticulosis of the descending  colon to the sigmoid and several small polyps which were removed.  The  patient has been fine since that episode in February until approximately 8-9  days ago when he began with crampy lower abdominal pain and fever.  He was  out of town on business at the time, and, after he returned home, complained  of persistent intermittent fevers, decrease in appetite and then more mid  pelvic pain with radiation into his groin.  He says at this time this has  progressed to more of a sharp, throbbing pain radiating into the groin area  bilaterally.  He has been having some dysuria, as well.  No pneumaturia or  hematuria.  He has not noted any melena or hematochezia, and, in fact, has  not had a bowel movement over the past couple of days.  He says he feels  poorly.  The patient had contacted Dr. Vernon Prey office, but had not  called here to speak to a nurse.  He were seen and evaluated in the office  today.  He was noted to be a ill with a temperature of 101, and is admitted  for IV antibiotics and a CT scan of the abdomen and pelvis to rule out to  recurrent abscess.   CURRENT MEDICATIONS:  1.  Verapamil  240 p.o. daily.  2.  Benazepril 20 daily.  3.  HCTZ 25 daily.  4.  Multivitamin daily.   ALLERGIES:  He does have a DYE ALLERGY with rash.   PAST HISTORY:  1.  As outlined.  2.  He had a remote hiatal hernia repair.   FAMILY HISTORY:  Negative for GI disease.   SOCIAL HISTORY:  The patient is married.  No tobacco, no EtOH.   PHYSICAL EXAMINATION:  GENERAL:  A well-developed, obese African-American  male in no acute distress.  He is uncomfortable.  VITAL SIGNS:  Weight is 376, temperature is 100.9, blood pressure 124/70,  pulse in the 70s.  HEENT:  Nontraumatic, normocephalic.  Pupils equal, round and reactive to  light and accommodation..  Sclerae anicteric.  CARDIOVASCULAR:  Regular rate and rhythm with S1-S2.  He does have a  soft  systolic murmur.  PULMONARY:  Clear to auscultation and percussion.  ABDOMEN:  Large, soft, tender in the suprapubic area with guarding.  There  is no mass or hepatosplenomegaly.  Bowel sounds are present.  RECTAL:  Not done at this time.  EXTREMITIES:  No clubbing, cyanosis or edema. that she had on his for car  his   LABORATORY DATA:  Pending.   IMPRESSION:  1.  A 45 year old male with recurrent diverticulitis.  Rule out abscess,      rule out colovesical fistula or bladder involvement with diverticulitis.  2.  Hypertension.  3.  Obesity status post Nissen fundoplication.  4.  Nonischemic cardiomyopathy.   PLAN:  The patient is admitted for IV fluid hydration, bowel rest, IV Cipro  and Flagyl, CT scan of the abdomen and pelvis, pain control, and, depending  on results of his CT scan, may need surgical consultation this admission.      Mike Gip, P.A.-C. [facility]      Vania Rea. Jarold Motto, M.D. [facility]  Electronically Signed    AE/MEDQ  D:  02/17/2006  T:  02/17/2006  Job:  [id number]   cc:   Gordy Savers, M.D. [facility]  [address] [place]  [place] [address]

## 2011-01-03 NOTE — Letter (Signed)
March 13, 2006     Glenna Fellows, M.D.  [address]  [place], [place]  [address]   RE:  Chad Lyons, Chad Lyons  MRN:  [id number]  /  DOB:  1966/03/16   Dear Chad Lyons:   Chad Lyons is my office note on November 07, 2005, concerning Chad Lyons and his  colonoscopy report.  On December 10, 2005, he had some hyperplastic polyps  removed.  Since that colonoscopy, he has had another hospitalization for  diverticulitis on February 17, 2006.   He returns today and is fairly asymptomatic except for some gas and  bloating.  He is scheduled to see you today and I do think he is a good  candidate for left colon resection.  He is using p.r.n. NuLev and is trying  to follow a high fiber diet as best as possible.   Thank you for your time and help in this matter.    Sincerely,      Vania Rea. Jarold Motto, MD, [place], [place]   DRP/MedQ  DD:  03/13/2006  DT:  03/13/2006  Job #:  [id number]

## 2011-01-10 ENCOUNTER — Encounter: Payer: Self-pay | Admitting: Internal Medicine

## 2011-01-10 ENCOUNTER — Ambulatory Visit (INDEPENDENT_AMBULATORY_CARE_PROVIDER_SITE_OTHER): Payer: BC Managed Care – PPO | Admitting: Internal Medicine

## 2011-01-10 DIAGNOSIS — I10 Essential (primary) hypertension: Secondary | ICD-10-CM

## 2011-01-10 DIAGNOSIS — I421 Obstructive hypertrophic cardiomyopathy: Secondary | ICD-10-CM

## 2011-01-10 MED ORDER — SERTRALINE HCL 50 MG PO TABS: 50.0000 mg | ORAL_TABLET | Freq: Every day | ORAL | Status: DC

## 2011-01-10 MED ORDER — VERAPAMIL HCL 240 MG PO TBCR: 240.0000 mg | EXTENDED_RELEASE_TABLET | Freq: Every day | ORAL | Status: DC

## 2011-01-10 MED ORDER — LISINOPRIL-HYDROCHLOROTHIAZIDE 20-25 MG PO TABS: 1.0000 | ORAL_TABLET | Freq: Every day | ORAL | Status: DC

## 2011-01-10 MED ORDER — ALPRAZOLAM 0.5 MG PO TABS: 0.5000 mg | ORAL_TABLET | Freq: Two times a day (BID) | ORAL | Status: DC

## 2011-01-10 NOTE — Patient Instructions (Signed)
Limit your sodium (Salt) intake    It is important that you exercise regularly, at least 20 minutes 3 to 4 times per week.  If you develop chest pain or shortness of breath seek  medical attention.  You need to lose weight.  Consider a lower calorie diet and regular exercise.  Return in 6 months for follow-up   

## 2011-01-10 NOTE — Progress Notes (Signed)
  Subjective:    Patient ID: Chad Lyons, male    DOB: 12-03-1965, 45 y.o.   MRN: [id number]  HPI  Wt Readings from Last 3 Encounters:  01/10/11 307 lb (139.254 kg)  10/11/10 314 lb (142.429 kg)  06/21/10 308 lb (139.708 kg)    Review of Systems     Objective:   Physical Exam        Assessment & Plan:

## 2011-01-10 NOTE — Progress Notes (Signed)
  Subjective:    Patient ID: RAVON MORTELLARO, male    DOB: 04/18/1966, 45 y.o.   MRN: [id number]  HPI  45 year old patient who is seen today for followup of his hypertension. He has a history of morbid obesity and status post lap band surgery. He also has a history of hypertrophic cardiomyopathy. He is doing quite well no concerns or complaints. He has been compliant with his medications    Review of Systems  Constitutional: Negative for fever, chills, appetite change and fatigue.  HENT: Negative for hearing loss, ear pain, congestion, sore throat, trouble swallowing, neck stiffness, dental problem, voice change and tinnitus.   Eyes: Negative for pain, discharge and visual disturbance.  Respiratory: Negative for cough, chest tightness, wheezing and stridor.   Cardiovascular: Negative for chest pain, palpitations and leg swelling.  Gastrointestinal: Negative for nausea, vomiting, abdominal pain, diarrhea, constipation, blood in stool and abdominal distention.  Genitourinary: Negative for urgency, hematuria, flank pain, discharge, difficulty urinating and genital sores.  Musculoskeletal: Negative for myalgias, back pain, joint swelling, arthralgias and gait problem.  Skin: Negative for rash.  Neurological: Negative for dizziness, syncope, speech difficulty, weakness, numbness and headaches.  Hematological: Negative for adenopathy. Does not bruise/bleed easily.  Psychiatric/Behavioral: Negative for behavioral problems and dysphoric mood. The patient is not nervous/anxious.        Objective:   Physical Exam  Constitutional: He is oriented to person, place, and time. He appears well-developed.       Weight 307 Blood pressure 120/76  HENT:  Head: Normocephalic.  Right Ear: External ear normal.  Left Ear: External ear normal.  Eyes: Conjunctivae and EOM are normal.  Neck: Normal range of motion.  Cardiovascular: Normal rate and normal heart sounds.   Pulmonary/Chest: Breath sounds normal.    Abdominal: Bowel sounds are normal.  Musculoskeletal: Normal range of motion. He exhibits no edema and no tenderness.  Neurological: He is alert and oriented to person, place, and time.  Psychiatric: He has a normal mood and affect. His behavior is normal.          Assessment & Plan:   Hypertension stable Morbid obesity. Weight loss exercise encouraged Hypertrophic cardiomyopathy

## 2011-03-23 ENCOUNTER — Emergency Department (HOSPITAL_BASED_OUTPATIENT_CLINIC_OR_DEPARTMENT_OTHER)
Admission: EM | Admit: 2011-03-23 | Discharge: 2011-03-23 | Disposition: A | Discharge status: Home / Self Care | Payer: BC Managed Care – PPO | Attending: Emergency Medicine | Admitting: Emergency Medicine

## 2011-03-23 ENCOUNTER — Emergency Department (INDEPENDENT_AMBULATORY_CARE_PROVIDER_SITE_OTHER): Payer: BC Managed Care – PPO

## 2011-03-23 ENCOUNTER — Encounter (HOSPITAL_BASED_OUTPATIENT_CLINIC_OR_DEPARTMENT_OTHER): Payer: Self-pay | Admitting: *Deleted

## 2011-03-23 DIAGNOSIS — W1809XA Striking against other object with subsequent fall, initial encounter: Secondary | ICD-10-CM | POA: Insufficient documentation

## 2011-03-23 DIAGNOSIS — Z043 Encounter for examination and observation following other accident: Secondary | ICD-10-CM

## 2011-03-23 DIAGNOSIS — I10 Essential (primary) hypertension: Secondary | ICD-10-CM | POA: Insufficient documentation

## 2011-03-23 DIAGNOSIS — W19XXXA Unspecified fall, initial encounter: Secondary | ICD-10-CM

## 2011-03-23 DIAGNOSIS — Y92009 Unspecified place in unspecified non-institutional (private) residence as the place of occurrence of the external cause: Secondary | ICD-10-CM | POA: Insufficient documentation

## 2011-03-23 DIAGNOSIS — S0990XA Unspecified injury of head, initial encounter: Secondary | ICD-10-CM | POA: Insufficient documentation

## 2011-03-23 HISTORY — DX: Essential (primary) hypertension: I10

## 2011-03-23 HISTORY — DX: Diverticulitis of intestine, part unspecified, without perforation or abscess without bleeding: K57.92

## 2011-03-23 NOTE — ED Notes (Signed)
the patient states he was at a party yesterday. the patient states he stood up, blacked out, fell backward and hit his head. The fall happened approximately 100am  this morning. the patient confirms blacking out, and LOC x2 last night

## 2011-03-23 NOTE — Discharge Instructions (Signed)
Concussion and Brain Injury   A blow or jolt to the head can disrupt the normal function of the brain. Doctors often call this type of brain injury a "concussion" or a "closed head injury." Concussions are usually not life threatening. Even so, the effects of a concussion can be serious.   CAUSES   A concussion is caused by a blunt blow to the head. The blow might be direct or indirect as described below.   Direct blow (running into another player during a soccer game, being hit in a fight, or hitting your head on a hard surface).   Indirect blow (when your head moves rapidly and violently back and forth like in a car crash).   SYMPTOMS   The brain is very complex. Every head injury is different. Some symptoms may appear right away. Other symptoms may not show up for days or weeks after the concussion. The signs of concussion can be hard to notice. Early on, problems may be missed by patients, family members and caregivers. You may look fine even though you are acting or feeling differently.   These symptoms are usually temporary, but may last for days, weeks, or even longer. Symptoms include:   Mild headaches that will not go away.   Having more trouble than usual with:   Remembering things.   Paying attention or concentrating.   Organizing daily tasks.   Making decisions and solving problems.   Slowness in thinking, acting, speaking or reading.  Getting lost or easily confused.   Neck pain.   Feeling tired all the time, lack of energy.   Change in sleeping pattern. (Sleeping for much longer periods of time than before. Trouble sleeping or insomnia.)   Loss of balance, feeling light-headed or dizzy.    Increased sensitivity to:   Sounds.   Lights.   Distractions.   Other symptoms might include:   Blurred vision or eyes that tire easily.   Diminished sense of taste or smell.   Ringing in the ears.  Mood changes: Feeling sad, anxious or listless.   Becoming easily irritated or angry for little or no reason.   Lack of  motivation.    DIAGNOSIS   Your caregiver can diagnose a concussion or mild brain injury based on your description of your injury and your symptoms. An exam is also important to check your brain and nervous system.   Your evaluation might include:   A brain scan to look for signs of injury to the brain. Even if the test shows no injury, you may still have a concussion.   Blood tests to be sure other problems are not present.   Depending on your circumstances, your caregivers might evaluate you for other injuries.   TREATMENT   People with a concussion need to be seen by a doctor. Most people with concussions are treated in an emergency department or a doctor's office. Some people must stay in the hospital overnight for further treatment.   Your caregiver will send you home with important instructions to follow. Be sure to carefully follow them.   Tell your caregiver if you are already taking any medicines (prescription, over-the-counter or natural remedies), or if you are drinking alcohol or taking illegal drugs. Also, talk with your caregiver if you are taking blood thinners (anticoagulant drugs) or aspirin. These drugs may increase your chances of complications. All of this is important information that may affect treatment.   Only take over-the-counter or prescription medicines for pain,   discomfort or fever as directed by your caregiver.   PROGNOSIS   How fast people recover from brain injury varies from person to person. Although most people have a good recovery, how quickly they improve depends on many factors. These factors include how severe their concussion was, what part of the brain was injured, their age and how healthy they were before the concussion.   Because all head injuries are different, so is recovery. Most people with mild injuries recover fully. Recovery can take time. In general, recovery is slower in older persons. Also, persons who have had a concussion in the past or have other medical  problems may find that it takes longer to recover from their current injury. Anxiety and depression may also make it harder to adjust to the symptoms of brain injury.   HOME CARE INSTRUCTIONS   Get plenty of sleep at night, and rest during the day. Rest helps the brain to heal.   Return to your normal activities slowly, not all at once. You may need to change your work or school activities.   Avoid activities that could lead to a second brain injury, such as contact or recreational sports, until your caregiver says it is okay. On rare occasions, receiving another concussion before a brain injury has healed can be fatal. Even after your brain injury has healed, you should protect yourself from having another concussion.   Ask your caregiver when you can drive a car, ride a bike or operate heavy equipment. Your ability to react may be slower after a brain injury.   Talk with your caregiver about when you can return to work or school. Ask your caregiver about ways to help your employer or teacher understand what has happened to you.   Take only those drugs that your caregiver has approved.   Do not drink alcohol until your caregiver says you are well enough to do so. Alcohol and certain other drugs may slow your recovery and can put you at risk of further injury.   If it is harder than usual to remember things, write them down.   If you are easily distracted, try to do one thing at a time. For example, do not try to watch TV while fixing dinner.   Talk with family members or close friends when making important decisions.   Avoid injuries by using:   Seatbelts when riding in a car   Alcohol only in moderation.   A helmet when biking, skiing, skateboarding, roller skating, or doing similar activities.   SEEK MEDICAL CARE IF:   A head injury can cause lingering symptoms. You should seek medical care if you have any of the following symptoms for more than 3 weeks after your injury or are planning to return to sports:    Chronic headaches.   Dizziness or balance problems.   Nausea.   Vision problems.   Increased sensitivity to noise or light.  Depression or mood swings.   Anxiety or irritability.   Memory problems.   Difficulty concentrating or paying attention.   Sleep problems.   Feeling tired all the time.    SEEK IMMEDIATE MEDICAL CARE IF:   You have had a blow or jolt to the head and you (or your family or friends) notice:   Headaches that get worse.   Weakness (even if only in one hand or one leg or one part of the face), numbness or decreased coordination.   Repeated vomiting.   Increased sleepiness or   passing out.  One pupil (the black part in the middle of the eye) is larger than the other.   Convulsions or seizures.   Slurred speech.   Confused, restless, or agitated behavior.    Older adults with a brain injury may have a higher risk of serious complications such as a blood clot on the brain. Headaches that get worse or an increase in confusion are signs of this complication. If these signs occur, see a caregiver right away.   FOR MORE INFORMATION   Several groups help people with brain injury and their families. They provide information and put people in touch with local resources. These include support groups, rehabilitation services, and a variety of health care professionals. Among these groups, the Brain Injury Association (BIA, www.biausa.org) has a national office that gathers scientific and educational information and works on a national level to help people with brain injury.   MAKE SURE YOU:   Understand these instructions.   Will watch your condition.   Will get help right away if you are not doing well or get worse.   Document Released: 10/25/2003 Document Re-Released: 01/22/2010   [employer] Patient Information 2011 [employer].

## 2011-03-23 NOTE — ED Provider Notes (Signed)
History     CSN: [id number] Arrival date & time: 03/23/2011 12:06 PM  Chief Complaint  Patient presents with  . Fall   HPI Comments: Pt states that he had a party yesterday and had not eaten anything and was drinking etoh:pt states that he jumped out of a chair to help someone and got dizzy and fell back and hit his head:pt wife states that they tried to get him up immediately again and he passed out again:wife states that once they let him sit there for a minute and gave him something to eat he was fine and he continued at the party:pt states that he is here because of the pain in his head and not because he passed out  Patient is a 45 y.o. male presenting with head injury. The history is provided by the patient. No language interpreter was used.  Head Injury  The incident occurred 6 to 12 hours ago. He came to the ER via walk-in. The injury mechanism was a direct blow and a fall. He lost consciousness for a period of less than one minute. There was no blood loss. The quality of the pain is described as throbbing. The pain is moderate. The pain has been constant since the injury. Pertinent negatives include no numbness, no blurred vision, no vomiting, no tinnitus, no disorientation, no weakness and no memory loss. He has tried nothing for the symptoms.    Past Medical History  Diagnosis Date  . Hypertension   . Diverticulitis     Past Surgical History  Procedure Date  . Colon surgery   . Laparoscopic gastric banding     No family history on file.  History  Substance Use Topics  . Smoking status: Former Smoker    Quit date: 10/11/2004  . Smokeless tobacco: Not on file  . Alcohol Use: Yes      Review of Systems  HENT: Negative for tinnitus.   Eyes: Negative for blurred vision.  Gastrointestinal: Negative for vomiting.  Neurological: Negative for weakness and numbness.  Psychiatric/Behavioral: Negative for memory loss.  All other systems reviewed and are  negative.    Physical Exam  BP 141/94  Pulse 52  Temp(Src) 99 F (37.2 C) (Oral)  Resp 18  SpO2 99%  Physical Exam  Nursing note and vitals reviewed. Constitutional: He is oriented to person, place, and time. He appears well-developed and well-nourished.  HENT:  Head:    Right Ear: Tympanic membrane normal.  Left Ear: Tympanic membrane normal.  Eyes: Conjunctivae and EOM are normal. Pupils are equal, round, and reactive to light.  Neck: Normal range of motion. Neck supple.  Cardiovascular: Normal rate and regular rhythm.   Pulmonary/Chest: Effort normal and breath sounds normal.  Abdominal: Soft. Bowel sounds are normal.  Musculoskeletal: Normal range of motion.       Cervical back: Normal.       Thoracic back: Normal.       Lumbar back: Normal.  Neurological: He is alert and oriented to person, place, and time. Coordination normal.  Skin: Skin is warm and dry.  Psychiatric: He has a normal mood and affect.    ED Course  Procedures  Results for orders placed in visit on 07/27/09  CONVERTED [facility] LAB      Component Value Range   Glucose, Urine, Semiquant negative     Bilirubin Urine negative     Ketones, urine, test strip negative     Blood in Urine, dipstick negative  Protein, U semiquant negative     Urobilinogen, UA 0.2     Nitrite negative     WBC Urine, dipstick negative     Color, Urine yellow     Appearance Clear     Specific Gravity, Urine 1.020     pH 5.5    CONVERTED [facility]      Component Value Range   Cholesterol 188  0-200 (mg/dL)   Triglycerides 782.9  0.0-149.0 (mg/dL)   HDL 56.21  >30.86 (mg/dL)   VLDL 57.8  4.6-96.2 (mg/dL)   LDL Cholesterol 952 (*) 0-99 (mg/dL)   Total CHOL/HDL Ratio 4     Sodium 139  135-145 (meq/L)   Potassium 3.7  3.5-5.1 (meq/L)   Chloride 105  96-112 (meq/L)   CO2 27  19-32 (meq/L)   Glucose, Bld 106 (*) 70-99 (mg/dL)   BUN 9  8-41 (mg/dL)   Creatinine, Ser 1.0  0.4-1.5 (mg/dL)   Calcium 9.7  3.2-44.0  (mg/dL)   GFR calc non Af Amer 104.24  >60 (mL/min)   WBC 6.3  4.5-10.5 (10*3/microliter)   RBC 5.15  4.22-5.81 (M/uL)   Hemoglobin 15.1  13.0-17.0 (g/dL)   HCT 10.2  72.5-36.6 (%)   MCV 87.4  78.0-100.0 (fL)   MCHC 33.6  30.0-36.0 (g/dL)   RDW 44.0  34.7-42.5 (%)   Platelets 216.0  150.0-400.0 (K/uL)   Neutrophils Relative 64.1  43.0-77.0 (%)   Lymphocytes Relative 23.9  12.0-46.0 (%)   Monocytes Relative 8.9  3.0-12.0 (%)   Eosinophils Relative 2.5  0.0-5.0 (%)   Basophils Relative 0.6  0.0-3.0 (%)   Neutro Abs 4.0  1.4-7.7 (K/uL)   Lymphs Abs 1.5  0.7-4.0 (K/uL)   Monocytes Absolute 0.6  0.1-1.0 (K/uL)   Eosinophils Absolute 0.2  0.0-0.7 (K/uL)   Basophils Absolute 0.0  0.0-0.1 (K/uL)   Total Bilirubin 0.7  0.3-1.2 (mg/dL)   Bilirubin, Direct 0.0  0.0-0.3 (mg/dL)   Alkaline Phosphatase 63  39-117 (units/L)   AST 43 (*) 0-37 (units/L)   ALT 47  0-53 (units/L)   Total Protein 7.7  6.0-8.3 (g/dL)   Albumin 4.2  9.5-6.3 (g/dL)   TSH 8.75  6.43-3.29 (microintl units/mL)   PSA 0.33  0.10-4.00 (ng/mL)   Ct Head Wo Contrast  03/23/2011  *RADIOLOGY REPORT*  Clinical Data: Fall  CT HEAD WITHOUT CONTRAST  Technique:  Contiguous axial images were obtained from the base of the skull through the vertex without contrast.  Comparison: None  Findings: There is no mass effect, midline shift, or acute intracranial hemorrhage.  Brain parenchyma and extraaxial space are unremarkable.  No suspicious low density to suggest this acute ischemia.  Mastoid air cells are clear.  Visualized paranasal sinuses are clear.  Cranium is intact.  IMPRESSION: No acute intracranial pathology.  Original Report Authenticated By: Donavan Burnet, M.D.    MDM No acute abnormality noted on x-ray:pt is okay to follow as needed   Medical screening examination/treatment/procedure(s) were performed by non-physician practitioner and as supervising physician I was immediately available for consultation/collaboration. Osvaldo Human, M.D.    Teressa Lower, NP 03/23/11 1304  Carleene Cooper III, MD 03/23/11 1314

## 2011-05-15 LAB — BASIC METABOLIC PANEL
BUN: 17
CO2: 27
Calcium: 10.4
Chloride: 103
Creatinine, Ser: 1.04
GFR calc Af Amer: >60
GFR calc non Af Amer: >60
Glucose, Bld: 102 — ABNORMAL HIGH
Potassium: 4.4
Sodium: 139

## 2011-05-15 LAB — HEMOGLOBIN AND HEMATOCRIT, BLOOD
HCT: 42.7
Hemoglobin: 14.3

## 2011-05-15 LAB — CBC
HCT: 37.9 — ABNORMAL LOW
Hemoglobin: 12.6 — ABNORMAL LOW
MCHC: 33.3
MCV: 82.1
Platelets: 209
RBC: 4.62
RDW: 14.5
WBC: 7.7

## 2011-05-15 LAB — DIFFERENTIAL
Basophils Absolute: 0
Basophils Relative: 0
Eosinophils Absolute: 0.1
Eosinophils Relative: 2
Lymphocytes Relative: 18
Lymphs Abs: 1.4
Monocytes Absolute: 1
Monocytes Relative: 13 — ABNORMAL HIGH
Neutro Abs: 5.2
Neutrophils Relative %: 68

## 2011-06-23 ENCOUNTER — Other Ambulatory Visit (INDEPENDENT_AMBULATORY_CARE_PROVIDER_SITE_OTHER): Payer: BC Managed Care – PPO

## 2011-06-23 DIAGNOSIS — Z0000 Encounter for general adult medical examination without abnormal findings: Secondary | ICD-10-CM

## 2011-06-23 LAB — CBC WITH DIFFERENTIAL/PLATELET
Basophils Absolute: 0 10*3/uL (ref 0.0–0.1)
Basophils Relative: 0.4 % (ref 0.0–3.0)
Eosinophils Absolute: 0.1 10*3/uL (ref 0.0–0.7)
Eosinophils Relative: 1.7 % (ref 0.0–5.0)
HCT: 42.6 % (ref 39.0–52.0)
Hemoglobin: 14.1 g/dL (ref 13.0–17.0)
Lymphocytes Relative: 19.7 % (ref 12.0–46.0)
Lymphs Abs: 1.2 10*3/uL (ref 0.7–4.0)
MCHC: 33.1 g/dL (ref 30.0–36.0)
MCV: 87.9 fl (ref 78.0–100.0)
Monocytes Absolute: 0.6 10*3/uL (ref 0.1–1.0)
Monocytes Relative: 10.2 % (ref 3.0–12.0)
Neutro Abs: 4 10*3/uL (ref 1.4–7.7)
Neutrophils Relative %: 68 % (ref 43.0–77.0)
Platelets: 215 10*3/uL (ref 150.0–400.0)
RBC: 4.84 Mil/uL (ref 4.22–5.81)
RDW: 14.4 % (ref 11.5–14.6)
WBC: 5.9 10*3/uL (ref 4.5–10.5)

## 2011-06-23 LAB — PSA: PSA: 0.2 ng/mL (ref 0.10–4.00)

## 2011-06-23 LAB — LIPID PANEL
Cholesterol: 146 mg/dL (ref 0–200)
HDL: 61.6 mg/dL (ref >39.00)
LDL Cholesterol: 72 mg/dL (ref 0–99)
Total CHOL/HDL Ratio: 2
Triglycerides: 60 mg/dL (ref 0.0–149.0)
VLDL: 12 mg/dL (ref 0.0–40.0)

## 2011-06-23 LAB — BASIC METABOLIC PANEL
BUN: 16 mg/dL (ref 6–23)
CO2: 27 mEq/L (ref 19–32)
Calcium: 9.2 mg/dL (ref 8.4–10.5)
Chloride: 104 mEq/L (ref 96–112)
Creatinine, Ser: 1 mg/dL (ref 0.4–1.5)
GFR: 107.05 mL/min (ref >60.00)
Glucose, Bld: 99 mg/dL (ref 70–99)
Potassium: 3.7 mEq/L (ref 3.5–5.1)
Sodium: 140 mEq/L (ref 135–145)

## 2011-06-23 LAB — HEPATIC FUNCTION PANEL
ALT: 27 U/L (ref 0–53)
AST: 35 U/L (ref 0–37)
Albumin: 4 g/dL (ref 3.5–5.2)
Alkaline Phosphatase: 49 U/L (ref 39–117)
Bilirubin, Direct: 0.1 mg/dL (ref 0.0–0.3)
Total Bilirubin: 0.5 mg/dL (ref 0.3–1.2)
Total Protein: 7 g/dL (ref 6.0–8.3)

## 2011-06-23 LAB — POCT URINALYSIS DIPSTICK
Bilirubin, UA: NEGATIVE
Blood, UA: NEGATIVE
Glucose, UA: NEGATIVE
Ketones, UA: NEGATIVE
Nitrite, UA: NEGATIVE
Protein, UA: NEGATIVE
Spec Grav, UA: 1.02
Urobilinogen, UA: 0.2
pH, UA: 6

## 2011-06-23 LAB — TSH: TSH: 1.05 u[IU]/mL (ref 0.35–5.50)

## 2011-06-30 ENCOUNTER — Encounter: Payer: Self-pay | Admitting: Internal Medicine

## 2011-06-30 ENCOUNTER — Ambulatory Visit (INDEPENDENT_AMBULATORY_CARE_PROVIDER_SITE_OTHER): Payer: BC Managed Care – PPO | Admitting: Internal Medicine

## 2011-06-30 VITALS — BP 140/92 | HR 80 | Temp 98.2°F | Resp 18 | Ht 73.75 in | Wt 311.0 lb

## 2011-06-30 DIAGNOSIS — Z0000 Encounter for general adult medical examination without abnormal findings: Secondary | ICD-10-CM

## 2011-06-30 DIAGNOSIS — Z23 Encounter for immunization: Secondary | ICD-10-CM

## 2011-06-30 DIAGNOSIS — I10 Essential (primary) hypertension: Secondary | ICD-10-CM

## 2011-06-30 DIAGNOSIS — I421 Obstructive hypertrophic cardiomyopathy: Secondary | ICD-10-CM

## 2011-06-30 DIAGNOSIS — F411 Generalized anxiety disorder: Secondary | ICD-10-CM

## 2011-06-30 NOTE — Patient Instructions (Signed)
Please check your blood pressure on a regular basis.  If it is consistently greater than 150/90, please make an office appointment.  You need to lose weight.  Consider a lower calorie diet and regular exercise.    It is important that you exercise regularly, at least 20 minutes 3 to 4 times per week.  If you develop chest pain or shortness of breath seek  medical attention.  Return in 6 months for follow-up

## 2011-06-30 NOTE — Progress Notes (Signed)
Subjective:    Patient ID: Chad Lyons, male    DOB: 12-Nov-1965, 45 y.o.   MRN: [id number]  HPI  Wt Readings from Last 3 Encounters:  06/30/11 311 lb (141.069 kg)  01/10/11 307 lb (139.254 kg)  10/11/10 314 lb (142.429 kg)   History of Present Illness:   45 year old patient who is seen today for a wellness exam. Medical problems include exogenous obesity. He is status post lap band surgery in June of 2009. He has a history of diverticular disease. Hypertrophic arthropathy and treated hypertension. He has lost over 100 pounds in weight since his surgery.   Preventive Screening-Counseling & Management  Caffeine-Diet-Exercise  Does Patient Exercise: yes   Allergies:  1) ! * Ct Dye  2) Levaquin (Levofloxacin)   Past History:  Past Medical History:  Reviewed history from 01/22/2007 and no changes required.  Hypertension  Obesity  Hypertrophic cardiomyopathy  Diverticulitis, hx of   Past Surgical History:  Reviewed history from 07/06/2008 and no changes required.  Hiatal hernia repair  Colectomy, sigmoid  Laparotmy for intra-abdominal bleeding  Lap-band surgery June 2009   Family History:  Reviewed history and no changes required.  Father age 87  Mother: HTN, palpitations, DM@ ?  1 brother  1 sister  3 maternal uncles with pancreatic cancer   Social History:  Reviewed history and no changes required.  Married  Regular exercise-yes treadmill  Does Patient Exercise: yes     Review of Systems  Constitutional: Negative for fever, chills, activity change, appetite change and fatigue.  HENT: Negative for hearing loss, ear pain, congestion, rhinorrhea, sneezing, mouth sores, trouble swallowing, neck pain, neck stiffness, dental problem, voice change, sinus pressure and tinnitus.   Eyes: Negative for photophobia, pain, redness and visual disturbance.  Respiratory: Negative for apnea, cough, choking, chest tightness, shortness of breath and wheezing.   Cardiovascular:  Negative for chest pain, palpitations and leg swelling.  Gastrointestinal: Negative for nausea, vomiting, abdominal pain, diarrhea, constipation, blood in stool, abdominal distention, anal bleeding and rectal pain.  Genitourinary: Negative for dysuria, urgency, frequency, hematuria, flank pain, decreased urine volume, discharge, penile swelling, scrotal swelling, difficulty urinating, genital sores and testicular pain.  Musculoskeletal: Negative for myalgias, back pain, joint swelling, arthralgias and gait problem.  Skin: Negative for color change, rash and wound.  Neurological: Negative for dizziness, tremors, seizures, syncope, facial asymmetry, speech difficulty, weakness, light-headedness, numbness and headaches.  Hematological: Negative for adenopathy. Does not bruise/bleed easily.  Psychiatric/Behavioral: Negative for suicidal ideas, hallucinations, behavioral problems, confusion, sleep disturbance, self-injury, dysphoric mood, decreased concentration and agitation. The patient is not nervous/anxious.        Objective:   Physical Exam  Constitutional: He appears well-developed and well-nourished.       Weight 311 Repeat blood pressure 130/80  HENT:  Head: Normocephalic and atraumatic.  Right Ear: External ear normal.  Left Ear: External ear normal.  Nose: Nose normal.  Mouth/Throat: Oropharynx is clear and moist.  Eyes: Conjunctivae and EOM are normal. Pupils are equal, round, and reactive to light. No scleral icterus.  Neck: Normal range of motion. Neck supple. No JVD present. No thyromegaly present.  Cardiovascular: Regular rhythm, normal heart sounds and intact distal pulses.  Exam reveals no gallop and no friction rub.   No murmur heard. Pulmonary/Chest: Effort normal and breath sounds normal. He exhibits no tenderness.  Abdominal: Soft. Bowel sounds are normal. He exhibits no distension and no mass. There is no tenderness.  Genitourinary: Prostate normal and  penis normal.    Musculoskeletal: Normal range of motion. He exhibits no edema and no tenderness.  Lymphadenopathy:    He has no cervical adenopathy.  Neurological: He is alert. He has normal reflexes. No cranial nerve deficit. Coordination normal.  Skin: Skin is warm and dry. No rash noted.  Psychiatric: He has a normal mood and affect. His behavior is normal.          Assessment & Plan:   Preventive health examination Hypertension Hypertrophic cardiomyopathy Anxiety depression stable History of complex diverticular disease  Weight loss encouraged We'll regular exercise encouraged Home blood pressure monitoring recommended  Recheck 6 months

## 2011-07-14 ENCOUNTER — Telehealth (INDEPENDENT_AMBULATORY_CARE_PROVIDER_SITE_OTHER): Payer: Self-pay | Admitting: General Surgery

## 2011-07-14 NOTE — Telephone Encounter (Signed)
07/14/11 mailed recall to patient for bariatric surgery follow-up. Adv pt to call [facility] to schedule an appt...cef °

## 2011-07-22 ENCOUNTER — Telehealth: Payer: Self-pay

## 2011-07-22 DIAGNOSIS — R361 Hematospermia: Secondary | ICD-10-CM

## 2011-07-22 NOTE — Telephone Encounter (Signed)
Ok to refer.

## 2011-07-22 NOTE — Telephone Encounter (Signed)
Pt would like a referral to a urologist.  Pt states when he has intercourse with his wife he has bleeding and would like to see a urologist about this problem.  Pls advise.

## 2011-07-22 NOTE — Telephone Encounter (Signed)
referral sent.  Pt aware he will be contacted by referral coordinator or urology.

## 2011-08-14 ENCOUNTER — Encounter (INDEPENDENT_AMBULATORY_CARE_PROVIDER_SITE_OTHER): Payer: Self-pay | Admitting: General Surgery

## 2011-09-25 ENCOUNTER — Ambulatory Visit (INDEPENDENT_AMBULATORY_CARE_PROVIDER_SITE_OTHER): Payer: BC Managed Care – PPO | Admitting: General Surgery

## 2011-09-25 ENCOUNTER — Encounter (INDEPENDENT_AMBULATORY_CARE_PROVIDER_SITE_OTHER): Payer: Self-pay | Admitting: General Surgery

## 2011-09-25 NOTE — Progress Notes (Signed)
Chief complaint: Followup lap band, dysphagia  History: Patient returns to the office for followup of the lap band that was placed in June of 2009. I have not actually seen him for over a year and a half. He states that for about the past year he has had persistent difficulty tolerating solid food. He will occasionally be able to be a few small bites of solid food but has been basically living on protein shakes and liquids. This has gradually gotten worse. He is not having any abdominal pain. No nighttime regurgitation or significant reflux.  Past medical history: Is significant for hypertension which is current. He has had a colectomy for perforated diverticulitis.  Exam:  Gen.: Appears well. Abdomen: Midline incision with moderate stable incisional hernia. Port site looks fine.  Assessment and plan: Status post lap band for morbid obesity. Total weight loss now is 115 pounds from preop of 423 2 current of 308. The BMI is down from preop of 54.32 current 39.6. He clearly appears to be over restrictive however. With this information were removed 1 cc from his band which should bring him down to 7 cc. We wanted to be able to tolerate small amounts of solid protein. If this does not help within the next week or 2 he will call back immediately and otherwise I will see him back in 2 months. It is probably time to consider repair of his incisional hernia as he's had very good weight loss.

## 2011-12-11 ENCOUNTER — Ambulatory Visit (INDEPENDENT_AMBULATORY_CARE_PROVIDER_SITE_OTHER): Payer: BC Managed Care – PPO | Admitting: General Surgery

## 2011-12-11 ENCOUNTER — Encounter (INDEPENDENT_AMBULATORY_CARE_PROVIDER_SITE_OTHER): Payer: Self-pay | Admitting: General Surgery

## 2011-12-11 DIAGNOSIS — Z4651 Encounter for fitting and adjustment of gastric lap band: Secondary | ICD-10-CM

## 2011-12-11 NOTE — Progress Notes (Signed)
Chief complaint: Followup lap band  History: Patient returns for followup lab and placed in June of 09. On his last visit he was having symptoms over restriction and we took 1 cc. This was 2-1/2 months ago. He now is able to tolerate solid food but eating more than he should and has actually put on 26 pounds. Otherwise his incisional hernia has been bothering him a little bit more with some bloating and occasional mild pain.  Exam: BP 150/98  Pulse 60  Temp(Src) 97.9 F (36.6 C) (Temporal)  Resp 16  Ht 6\' 2"  (1.88 m)  Wt 333 lb 12.8 oz (151.411 kg)  BMI 42.86 kg/m2 Total weight loss 90 pounds General: Appears well Abdomen: Moderate incisional hernia at the umbilicus and extending a little ways up the midline. Otherwise soft and nontender.  Assessment and plan: #1 status post lap band for morbid obesity. He clearly needs more restriction and we went ahead and added 0.75 cc back to stand today. #2 ventral incisional hernia. I'll see him back in 2 months to followup as lap band at that time we plan to go ahead and get him scheduled for laparoscopic repair of his incisional hernia.

## 2011-12-29 ENCOUNTER — Ambulatory Visit (INDEPENDENT_AMBULATORY_CARE_PROVIDER_SITE_OTHER): Payer: BC Managed Care – PPO | Admitting: Internal Medicine

## 2011-12-29 ENCOUNTER — Encounter: Payer: Self-pay | Admitting: Internal Medicine

## 2011-12-29 DIAGNOSIS — I10 Essential (primary) hypertension: Secondary | ICD-10-CM

## 2011-12-29 MED ORDER — ALPRAZOLAM 0.5 MG PO TABS: 0.5000 mg | ORAL_TABLET | Freq: Two times a day (BID) | ORAL | Status: DC

## 2011-12-29 MED ORDER — LISINOPRIL-HYDROCHLOROTHIAZIDE 20-25 MG PO TABS: 1.0000 | ORAL_TABLET | Freq: Every day | ORAL | Status: DC

## 2011-12-29 MED ORDER — VERAPAMIL HCL ER 240 MG PO TBCR: 240.0000 mg | EXTENDED_RELEASE_TABLET | Freq: Every day | ORAL | Status: DC

## 2011-12-29 MED ORDER — SERTRALINE HCL 50 MG PO TABS: 50.0000 mg | ORAL_TABLET | Freq: Every day | ORAL | Status: DC

## 2011-12-29 NOTE — Progress Notes (Signed)
  Subjective:    Patient ID: Chad Lyons, male    DOB: 10/07/1965, 47 y.o.   MRN: [id number]  HPI  Wt Readings from Last 3 Encounters:  12/29/11 328 lb (148.78 kg)  12/11/11 333 lb 12.8 oz (151.411 kg)  09/25/11 308 lb 3.2 oz (139.799 kg)    Review of Systems     Objective:   Physical Exam        Assessment & Plan:

## 2011-12-29 NOTE — Progress Notes (Signed)
  Subjective:    Patient ID: Chad Lyons, male    DOB: [birth year], 46 y.o.   MRN: [id number]  HPI  46 year old patient has a history of hypertension. He has a history also of hypertrophic cardiopathy as well as morbid obesity. He has had his lap band adjusted recently with some improvement with his weight. He is scheduled for surgical followup again soon. Denies any cardiopulmonary complaints. In general feels quite well    Review of Systems  Constitutional: Negative for fever, chills, appetite change and fatigue.  HENT: Negative for hearing loss, ear pain, congestion, sore throat, trouble swallowing, neck stiffness, dental problem, voice change and tinnitus.   Eyes: Negative for pain, discharge and visual disturbance.  Respiratory: Negative for cough, chest tightness, wheezing and stridor.   Cardiovascular: Negative for chest pain, palpitations and leg swelling.  Gastrointestinal: Negative for nausea, vomiting, abdominal pain, diarrhea, constipation, blood in stool and abdominal distention.  Genitourinary: Negative for urgency, hematuria, flank pain, discharge, difficulty urinating and genital sores.  Musculoskeletal: Negative for myalgias, back pain, joint swelling, arthralgias and gait problem.  Skin: Negative for rash.  Neurological: Negative for dizziness, syncope, speech difficulty, weakness, numbness and headaches.  Hematological: Negative for adenopathy. Does not bruise/bleed easily.  Psychiatric/Behavioral: Negative for behavioral problems and dysphoric mood. The patient is not nervous/anxious.        Objective:   Physical Exam  Constitutional: He is oriented to person, place, and time. He appears well-developed.       Weight 228 Blood pressure 120/75  HENT:  Head: Normocephalic.  Right Ear: External ear normal.  Left Ear: External ear normal.  Eyes: Conjunctivae and EOM are normal.  Neck: Normal range of motion.  Cardiovascular: Normal rate and normal heart sounds.     Pulmonary/Chest: Breath sounds normal.  Abdominal: Bowel sounds are normal.  Musculoskeletal: Normal range of motion. He exhibits no edema and no tenderness.  Neurological: He is alert and oriented to person, place, and time.  Psychiatric: He has a normal mood and affect. His behavior is normal.          Assessment & Plan:  Hypertension well controlled Morbid obesity  Weight loss encouraged exercise low salt diet encouraged Recheck 6 months

## 2011-12-29 NOTE — Patient Instructions (Signed)
Limit your sodium (Salt) intake  You need to lose weight.  Consider a lower calorie diet and regular exercise.    It is important that you exercise regularly, at least 20 minutes 3 to 4 times per week.  If you develop chest pain or shortness of breath seek  medical attention.   

## 2012-01-14 ENCOUNTER — Other Ambulatory Visit: Payer: Self-pay

## 2012-01-14 MED ORDER — SERTRALINE HCL 50 MG PO TABS: 50.0000 mg | ORAL_TABLET | Freq: Every day | ORAL | Status: DC

## 2012-01-14 MED ORDER — VERAPAMIL HCL ER 240 MG PO TBCR: 240.0000 mg | EXTENDED_RELEASE_TABLET | Freq: Every day | ORAL | Status: DC

## 2012-02-12 ENCOUNTER — Encounter (INDEPENDENT_AMBULATORY_CARE_PROVIDER_SITE_OTHER): Payer: BC Managed Care – PPO | Admitting: General Surgery

## 2012-02-16 ENCOUNTER — Telehealth (INDEPENDENT_AMBULATORY_CARE_PROVIDER_SITE_OTHER): Payer: Self-pay

## 2012-02-16 NOTE — Telephone Encounter (Signed)
Message copied by Maryan Puls on Mon Feb 16, 2012  9:15 AM ------      Message from: Delcie Roch      Created: Mon Jan 26, 2012 12:59 PM      Regarding: r/s appt      Contact: [phone]       Pt can't make 06/27 appt.  It's a two-month check - any suggestions where to put him.  I'll call him if you'd like.

## 2012-02-16 NOTE — Telephone Encounter (Signed)
Patient scheduled for 2 month recheck on 02/17/12 @ 2:15pm.  Patient states he has pulled something over the weekend and now has an increase in discomfort.

## 2012-02-17 ENCOUNTER — Ambulatory Visit (INDEPENDENT_AMBULATORY_CARE_PROVIDER_SITE_OTHER): Payer: BC Managed Care – PPO | Admitting: General Surgery

## 2012-02-17 ENCOUNTER — Encounter (INDEPENDENT_AMBULATORY_CARE_PROVIDER_SITE_OTHER): Payer: Self-pay | Admitting: General Surgery

## 2012-02-17 VITALS — BP 144/91 | HR 66 | Ht 74.0 in | Wt 319.8 lb

## 2012-02-17 DIAGNOSIS — K439 Ventral hernia without obstruction or gangrene: Secondary | ICD-10-CM

## 2012-02-17 NOTE — Progress Notes (Signed)
Chief complaint: Followup lap band and ventral hernia  History: Patient returns for followup of his lap band placed in 2009 as well as his known ventral hernia. 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 He has had a known periumbilical incisional hernia since that time. He had a LAP-BAND placement 2009. We did fill 2 months ago and he has lost 15 pounds and current weight loss is 104 pounds for preoperative for 23 current 319 and BMI is down from preoperative 54 recurrent 41. Lifting the cooler last week he developed some acute pain over his right rib cage and near his port site but still is quite sore. He gets an occasional pain and tightness that is incisional hernia which is essentially unchanged in recent months. Past Medical History  Diagnosis Date  . Hypertension   . Diverticulitis    Past Surgical History  Procedure Date  . Colon surgery   . Laparoscopic gastric banding    Current Outpatient Prescriptions  Medication Sig Dispense Refill  . ALPRAZolam (XANAX) 0.5 MG tablet Take 1 tablet (0.5 mg total) by mouth 2 (two) times daily.  60 tablet  4  . lisinopril-hydrochlorothiazide (PRINZIDE,ZESTORETIC) 20-25 MG per tablet Take 1 tablet by mouth daily.  90 tablet  6  . sertraline (ZOLOFT) 50 MG tablet Take 1 tablet (50 mg total) by mouth daily.  90 tablet  6  . verapamil (CALAN-SR) 240 MG CR tablet Take 1 tablet (240 mg total) by mouth at bedtime.  90 tablet  4   Allergies  Allergen Reactions  . Iohexol       Desc: PT DEVELOPS ITCHING AND HIVES SEVERAL HOURS AFTER INJECTION OF OMNIPAQUE   . Levofloxacin     REACTION: unspecified  . Penicillins    Exam: Gen.: Overweight African American male in no distress Skin: Warm and dry without rash or infection HEENT: No adenopathy. Sclera nonicteric Lungs: Clear that we refer her breathing Cardiovascular: Regular rate without  murmur no edema Abdomen: He has some tenderness over his port site and rib cage in the right upper quadrant but no evidence of hernia or hematoma or other problem here. He has an incisional hernia at and around the umbilicus several centimeters in diameter. It is reducible. No masses organomegaly. Extremities: No joint swelling or edema Neurologic: Alert and fully oriented. Gait normal.  Assessment and plan: He is adequately restricted with his lap band and again has good progressive weight loss. We discussed going ahead with repair of his symptomatic ventral incisional hernia which he would like to do. I discussed laparoscopic and possible open repair using mesh and indications in nature the procedure, expected recovery, risks of anesthetic complications, bleeding, infection, possible need for open surgery and risk of recurrence. We'll schedule this as convenience.

## 2012-03-11 ENCOUNTER — Encounter (INDEPENDENT_AMBULATORY_CARE_PROVIDER_SITE_OTHER): Payer: BC Managed Care – PPO | Admitting: General Surgery

## 2012-05-12 ENCOUNTER — Ambulatory Visit: Admit: 2012-05-12 | Payer: BC Managed Care – PPO | Admitting: General Surgery

## 2012-05-12 SURGERY — REPAIR, HERNIA, VENTRAL, LAPAROSCOPIC
Anesthesia: General

## 2012-06-30 ENCOUNTER — Ambulatory Visit (INDEPENDENT_AMBULATORY_CARE_PROVIDER_SITE_OTHER): Payer: BC Managed Care – PPO | Admitting: Internal Medicine

## 2012-06-30 ENCOUNTER — Encounter: Payer: Self-pay | Admitting: Internal Medicine

## 2012-06-30 DIAGNOSIS — I421 Obstructive hypertrophic cardiomyopathy: Secondary | ICD-10-CM

## 2012-06-30 DIAGNOSIS — Z23 Encounter for immunization: Secondary | ICD-10-CM

## 2012-06-30 DIAGNOSIS — I10 Essential (primary) hypertension: Secondary | ICD-10-CM

## 2012-06-30 MED ORDER — LISINOPRIL-HYDROCHLOROTHIAZIDE 20-25 MG PO TABS: 1.0000 | ORAL_TABLET | Freq: Every day | ORAL | Status: DC

## 2012-06-30 MED ORDER — SERTRALINE HCL 50 MG PO TABS: 50.0000 mg | ORAL_TABLET | Freq: Every day | ORAL | Status: DC

## 2012-06-30 MED ORDER — ALPRAZOLAM 0.5 MG PO TABS: 0.5000 mg | ORAL_TABLET | Freq: Two times a day (BID) | ORAL | Status: DC

## 2012-06-30 MED ORDER — VERAPAMIL HCL ER 240 MG PO TBCR: 240.0000 mg | EXTENDED_RELEASE_TABLET | Freq: Every day | ORAL | Status: DC

## 2012-06-30 NOTE — Progress Notes (Signed)
  Subjective:    Patient ID: Chad Lyons, male    DOB: 20-Feb-1966, 46 y.o.   MRN: [id number]  HPI  Wt Readings from Last 3 Encounters:  06/30/12 310 lb (140.615 kg)  02/17/12 319 lb 12.8 oz (145.06 kg)  12/29/11 328 lb (148.78 kg)    BP Readings from Last 3 Encounters:  06/30/12 136/82  02/17/12 144/91  12/29/11 118/80    Review of Systems     Objective:   Physical Exam        Assessment & Plan:

## 2012-06-30 NOTE — Progress Notes (Signed)
Subjective:    Patient ID: Chad Lyons, male    DOB: 10/20/1965, 46 y.o.   MRN: [id number]  HPI  46 year old patient who is seen today for followup. He has a history of morbid obesity and is status post lap band surgery. He has a history of hypertension and also hypertrophic cardiomyopathy. He has done quite well with some minor cough and chest congestion. No fever or sputum production.  Past Medical History  Diagnosis Date  . Hypertension   . Diverticulitis     History   Social History  . Marital Status: Married    Spouse Name: N/A    Number of Children: N/A  . Years of Education: N/A   Occupational History  . Not on file.   Social History Main Topics  . Smoking status: Former Smoker    Quit date: 10/11/2004  . Smokeless tobacco: Never Used  . Alcohol Use: Yes  . Drug Use: No  . Sexually Active: Not on file   Other Topics Concern  . Not on file   Social History Narrative  . No narrative on file    Past Surgical History  Procedure Date  . Colon surgery   . Laparoscopic gastric banding     Family History  Problem Relation Age of Onset  . Heart disease Mother   . Heart disease Maternal Aunt   . Cancer Maternal Uncle     pancreatic  . Heart disease Maternal Uncle     Allergies  Allergen Reactions  . Iohexol       Desc: PT DEVELOPS ITCHING AND HIVES SEVERAL HOURS AFTER INJECTION OF OMNIPAQUE   . Levofloxacin     REACTION: unspecified  . Penicillins     Current Outpatient Prescriptions on File Prior to Visit  Medication Sig Dispense Refill  . ALPRAZolam (XANAX) 0.5 MG tablet Take 1 tablet (0.5 mg total) by mouth 2 (two) times daily.  60 tablet  4  . lisinopril-hydrochlorothiazide (PRINZIDE,ZESTORETIC) 20-25 MG per tablet Take 1 tablet by mouth daily.  90 tablet  6  . sertraline (ZOLOFT) 50 MG tablet Take 1 tablet (50 mg total) by mouth daily.  90 tablet  6  . verapamil (CALAN-SR) 240 MG CR tablet Take 1 tablet (240 mg total) by mouth at bedtime.  90  tablet  4    BP 136/82  Pulse 60  Temp 98.1 F (36.7 C) (Oral)  Resp 18  Wt 310 lb (140.615 kg)  SpO2 97%       Review of Systems  Constitutional: Negative for fever, chills, appetite change and fatigue.  HENT: Positive for congestion. Negative for hearing loss, ear pain, sore throat, trouble swallowing, neck stiffness, dental problem, voice change and tinnitus.   Eyes: Negative for pain, discharge and visual disturbance.  Respiratory: Positive for cough. Negative for chest tightness, wheezing and stridor.   Cardiovascular: Negative for chest pain, palpitations and leg swelling.  Gastrointestinal: Negative for nausea, vomiting, abdominal pain, diarrhea, constipation, blood in stool and abdominal distention.  Genitourinary: Negative for urgency, hematuria, flank pain, discharge, difficulty urinating and genital sores.  Musculoskeletal: Negative for myalgias, back pain, joint swelling, arthralgias and gait problem.  Skin: Negative for rash.  Neurological: Negative for dizziness, syncope, speech difficulty, weakness, numbness and headaches.  Hematological: Negative for adenopathy. Does not bruise/bleed easily.  Psychiatric/Behavioral: Negative for behavioral problems and dysphoric mood. The patient is not nervous/anxious.        Objective:   Physical Exam  Constitutional: He is oriented  to person, place, and time. He appears well-developed.       Blood pressure 140/84 Weight 310  HENT:  Head: Normocephalic.  Right Ear: External ear normal.  Left Ear: External ear normal.  Eyes: Conjunctivae normal and EOM are normal.  Neck: Normal range of motion.  Cardiovascular: Normal rate and normal heart sounds.   Pulmonary/Chest: Breath sounds normal.  Abdominal: Bowel sounds are normal.  Musculoskeletal: Normal range of motion. He exhibits no edema and no tenderness.  Neurological: He is alert and oriented to person, place, and time.  Psychiatric: He has a normal mood and affect.  His behavior is normal.          Assessment & Plan:   Hypertension controlled Morbid obesity continues to improve  No change medications Recheck 6 months

## 2012-06-30 NOTE — Patient Instructions (Signed)
Limit your sodium (Salt) intake  You need to lose weight.  Consider a lower calorie diet and regular exercise.  Please check your blood pressure on a regular basis.  If it is consistently greater than 150/90, please make an office appointment.    It is important that you exercise regularly, at least 20 minutes 3 to 4 times per week.  If you develop chest pain or shortness of breath seek  medical attention.  Return in 6 months for follow-up  

## 2012-08-19 ENCOUNTER — Encounter: Payer: Self-pay | Admitting: Internal Medicine

## 2012-08-19 ENCOUNTER — Telehealth: Payer: Self-pay | Admitting: Internal Medicine

## 2012-08-19 ENCOUNTER — Ambulatory Visit (INDEPENDENT_AMBULATORY_CARE_PROVIDER_SITE_OTHER): Payer: BC Managed Care – PPO | Admitting: Internal Medicine

## 2012-08-19 VITALS — BP 130/82 | HR 72 | Temp 98.3°F | Resp 20 | Wt 308.0 lb

## 2012-08-19 DIAGNOSIS — J069 Acute upper respiratory infection, unspecified: Secondary | ICD-10-CM

## 2012-08-19 DIAGNOSIS — I10 Essential (primary) hypertension: Secondary | ICD-10-CM

## 2012-08-19 DIAGNOSIS — J209 Acute bronchitis, unspecified: Secondary | ICD-10-CM

## 2012-08-19 MED ORDER — HYDROCODONE-HOMATROPINE 5-1.5 MG/5ML PO SYRP: 5.0000 mL | ORAL_SOLUTION | Freq: Four times a day (QID) | ORAL | Status: AC | PRN

## 2012-08-19 MED ORDER — DOXYCYCLINE HYCLATE 100 MG PO TABS: 100.0000 mg | ORAL_TABLET | Freq: Two times a day (BID) | ORAL | Status: DC

## 2012-08-19 NOTE — Progress Notes (Signed)
Subjective:    Patient ID: Chad Lyons, male    DOB: 06/18/1966, 47 y.o.   MRN: [id number]  HPI  47 year old patient who has a history of morbid obesity status post LAP-BAND surgery. He continues to lose weight. For the past 2 weeks he has had a refractory cough. This has gotten progressively worse in spite of the OTC medications cough is now productive of copious discolored sputum. He has intermittent low-grade fever and feels generally unwell. Denies any wheezing. He is developed considerable chest wall pain related to the incessant cough  Past Medical History  Diagnosis Date  . Hypertension   . Diverticulitis     History   Social History  . Marital Status: Married    Spouse Name: N/A    Number of Children: N/A  . Years of Education: N/A   Occupational History  . Not on file.   Social History Main Topics  . Smoking status: Former Smoker    Quit date: 10/11/2004  . Smokeless tobacco: Never Used  . Alcohol Use: Yes  . Drug Use: No  . Sexually Active: Not on file   Other Topics Concern  . Not on file   Social History Narrative  . No narrative on file    Past Surgical History  Procedure Date  . Colon surgery   . Laparoscopic gastric banding     Family History  Problem Relation Age of Onset  . Heart disease Mother   . Heart disease Maternal Aunt   . Cancer Maternal Uncle     pancreatic  . Heart disease Maternal Uncle     Allergies  Allergen Reactions  . Iohexol       Desc: PT DEVELOPS ITCHING AND HIVES SEVERAL HOURS AFTER INJECTION OF OMNIPAQUE   . Levofloxacin     REACTION: unspecified  . Penicillins     Current Outpatient Prescriptions on File Prior to Visit  Medication Sig Dispense Refill  . ALPRAZolam (XANAX) 0.5 MG tablet Take 1 tablet (0.5 mg total) by mouth 2 (two) times daily.  60 tablet  4  . lisinopril-hydrochlorothiazide (PRINZIDE,ZESTORETIC) 20-25 MG per tablet Take 1 tablet by mouth daily.  90 tablet  6  . sertraline (ZOLOFT) 50 MG  tablet Take 1 tablet (50 mg total) by mouth daily.  90 tablet  6  . verapamil (CALAN-SR) 240 MG CR tablet Take 1 tablet (240 mg total) by mouth at bedtime.  90 tablet  4    BP 130/82  Pulse 72  Temp 98.3 F (36.8 C) (Oral)  Resp 20  Wt 308 lb (139.708 kg)  SpO2 97%      Review of Systems  Constitutional: Positive for fever, appetite change and fatigue. Negative for chills.  HENT: Negative for hearing loss, ear pain, congestion, sore throat, trouble swallowing, neck stiffness, dental problem, voice change and tinnitus.   Eyes: Negative for pain, discharge and visual disturbance.  Respiratory: Positive for cough. Negative for chest tightness, wheezing and stridor.   Cardiovascular: Negative for chest pain, palpitations and leg swelling.  Gastrointestinal: Negative for nausea, vomiting, abdominal pain, diarrhea, constipation, blood in stool and abdominal distention.  Genitourinary: Negative for urgency, hematuria, flank pain, discharge, difficulty urinating and genital sores.  Musculoskeletal: Negative for myalgias, back pain, joint swelling, arthralgias and gait problem.  Skin: Negative for rash.  Neurological: Negative for dizziness, syncope, speech difficulty, weakness, numbness and headaches.  Hematological: Negative for adenopathy. Does not bruise/bleed easily.  Psychiatric/Behavioral: Negative for behavioral problems and dysphoric mood.  The patient is not nervous/anxious.        Objective:   Physical Exam  Constitutional: He is oriented to person, place, and time. He appears well-developed and well-nourished. No distress.  HENT:  Head: Normocephalic.  Right Ear: External ear normal.  Left Ear: External ear normal.  Eyes: Conjunctivae normal and EOM are normal.  Neck: Normal range of motion.  Cardiovascular: Normal rate and normal heart sounds.   Pulmonary/Chest: Effort normal and breath sounds normal.  Abdominal: Bowel sounds are normal.  Musculoskeletal: Normal range  of motion. He exhibits no edema and no tenderness.  Neurological: He is alert and oriented to person, place, and time.  Psychiatric: He has a normal mood and affect. His behavior is normal.          Assessment & Plan:   Acute bronchitis. Will treat with Hydromet and doxycycline Hypertension stable Morbid obesity. Improving status post lap band surgery

## 2012-08-19 NOTE — Telephone Encounter (Signed)
Pt has appointment today with Dr. Kirtland Bouchard.

## 2012-08-19 NOTE — Telephone Encounter (Signed)
Patient Information:  Caller Name: Demarious  Phone: [phone]  Patient: Chad Lyons, Chad Lyons  Gender: Male  DOB: 1966-03-24  Age: 47 Years  PCP: Eleonore Chiquito [facility])  Office Follow Up:  Does the office need to follow up with this patient?: No  Instructions For The Office: N/A   Symptoms  Reason For Call & Symptoms: Has had a cold and cough /sputum is orange color/wheezing/fever  Reviewed Health History In EMR: Yes  Reviewed Medications In EMR: Yes  Reviewed Allergies In EMR: Yes  Reviewed Surgeries / Procedures: Yes  Date of Onset of Symptoms: 07/29/2012  Treatments Tried: Mucinex  Treatments Tried Worked: No  Any Fever: Yes  Fever Taken: Oral  Fever Time Of Reading: 19:30:09  Fever Last Reading: 101  Guideline(s) Used:  Cough  Disposition Per Guideline:   Go to Office Now  Reason For Disposition Reached:   Wheezing is present  Advice Given:  N/A  Appointment Scheduled:  08/19/2012 09:15:00 Appointment Scheduled Provider:  Eleonore Chiquito (Family Practice)

## 2012-08-19 NOTE — Patient Instructions (Signed)
Take over-the-counter expectorants and cough medications such as  Mucinex DM.  Call if there is no improvement in 5 to 7 days or if he developed worsening cough, fever, or new symptoms, such as shortness of breath or chest pain.  Take your antibiotic as prescribed until ALL of it is gone, but stop if you develop a rash, swelling, or any side effects of the medication.  Contact our office as soon as possible if  there are side effects of the medication. 

## 2012-08-20 ENCOUNTER — Telehealth: Payer: Self-pay | Admitting: Internal Medicine

## 2012-08-20 NOTE — Telephone Encounter (Signed)
Patient Information:  Caller Name: Aison  Phone: [phone]  Patient: Chad Lyons, Chad Lyons  Gender: Male  DOB: December 09, 1965  Age: 47 Years  PCP: Eleonore Chiquito [facility])  Office Follow Up:  Does the office need to follow up with this patient?: Yes  Instructions For The Office: Please contact patient and let him know if he needs to change the type of cough syrup he is taking or if he needs to schedule an appointment. Thanks.  RN Note:  Referenced Severe Allergic Reaction guideline. Patient reports that the itching starts approximately 2 hours after taking the cough medication. Disposition: see provider within 72 hours for "Persistent or recurring symptoms that reliably improve after antihistamines but either recur or never completely resolve." Advised patient someone will call him back regarding next actions.  Symptoms  Reason For Call & Symptoms: Reports itching all over after taking Codeine in cough syrup. Mother told him that she remembers he was allergic to Codeine. Patient does not recall this.  Reviewed Health History In EMR: Yes  Reviewed Medications In EMR: Yes  Reviewed Allergies In EMR: Yes  Reviewed Surgeries / Procedures: Yes  Date of Onset of Symptoms: 08/19/2012  Treatments Tried: Benadryl  Treatments Tried Worked: Yes  Guideline(s) Used:  No Protocol Available - Sick Adult  Disposition Per Guideline:   Callback by PCP Today  Reason For Disposition Reached:   Nursing judgment  Advice Given:  Call Back If:  New symptoms develop  You become worse.

## 2012-08-20 NOTE — Telephone Encounter (Signed)
Spoke to pt told him to discontinue cough medicine and try Mucinex DM per Dr. Amador Cunas. Pt verbalized understanding. Asked pt if any SOB or swelling of throat? Pt denied any problems stated feels better more open. Told pt okay need to know what cough medicine it was so I can put in chart as allergy? Pt stated [place] me know at neighbors at present. Told pt if not better by Monday call for an appointment. Pt verbalized understanding.

## 2012-08-20 NOTE — Telephone Encounter (Signed)
Discontinue cough medicine  and document  in chart as allergic Call and check on status and suggest Mucinex DM

## 2012-08-23 ENCOUNTER — Telehealth: Payer: Self-pay | Admitting: *Deleted

## 2012-08-23 MED ORDER — BENZONATATE 200 MG PO CAPS: 200.0000 mg | ORAL_CAPSULE | Freq: Three times a day (TID) | ORAL | Status: DC | PRN

## 2012-08-23 NOTE — Telephone Encounter (Signed)
Spoke to pt told him Rx for Tessalon capsules for cough was called into pharmacy and to stop Lisinopril temporarily it may be aggravating his cough and continue to take Mucinex DM. Pt verbalized understanding.

## 2012-08-23 NOTE — Telephone Encounter (Signed)
Returned pt's call, c/o feeling worse has been up all weekend coughing not able to sleep. Taking antibiotic Doxycycline  as prescribed and Mucinex DM. Told pt will check with Dr. Amador Cunas and get back to him. Pt verbalized understanding.

## 2012-08-23 NOTE — Addendum Note (Signed)
Addended by: Jimmye Norman on: 08/23/2012 12:57 PM   Modules accepted: Orders

## 2012-08-23 NOTE — Telephone Encounter (Signed)
Discontinue lisinopril Refill cough medicine if needed Add Mucinex DM if he has not taken presently is in

## 2012-08-23 NOTE — Telephone Encounter (Signed)
Left message on voicemail Rx called into pharmacy  

## 2012-08-23 NOTE — Telephone Encounter (Signed)
Please call in a prescription for generic Tessalon 200 mg #30 one every 8 hours;  lisinopril may be aggravating his cough as suggest he hold this medication temporarily Please mark his chart as allergic to Hydromet

## 2012-11-24 ENCOUNTER — Ambulatory Visit (INDEPENDENT_AMBULATORY_CARE_PROVIDER_SITE_OTHER): Payer: BC Managed Care – PPO | Admitting: Family Medicine

## 2012-11-24 ENCOUNTER — Encounter: Payer: Self-pay | Admitting: Family Medicine

## 2012-11-24 ENCOUNTER — Telehealth: Payer: Self-pay | Admitting: Internal Medicine

## 2012-11-24 VITALS — BP 140/90 | Temp 99.2°F

## 2012-11-24 DIAGNOSIS — L559 Sunburn, unspecified: Secondary | ICD-10-CM

## 2012-11-24 DIAGNOSIS — X32XXXA Exposure to sunlight, initial encounter: Secondary | ICD-10-CM

## 2012-11-24 DIAGNOSIS — W899XXA Exposure to unspecified man-made visible and ultraviolet light, initial encounter: Secondary | ICD-10-CM

## 2012-11-24 MED ORDER — TRIAMCINOLONE ACETONIDE 0.1 % EX CREA: TOPICAL_CREAM | Freq: Two times a day (BID) | CUTANEOUS | Status: DC

## 2012-11-24 MED ORDER — PREDNISONE 10 MG PO TABS: ORAL_TABLET | ORAL | Status: DC

## 2012-11-24 NOTE — Telephone Encounter (Signed)
Patient Information:  Caller Name: Perseus  Phone: [phone]  Patient: Chad Lyons, Chad Lyons  Gender: Male  DOB: [birth year]  Age: 47 Years  PCP: Eleonore Chiquito [facility])  Office Follow Up:  Does the office need to follow up with this patient?: Yes  Instructions For The Office: Please call back ASAP regarding work in appointment 11/24/12.  RN Note:  Called regarding possible sun poisoning due to itchy, papular rash on neck and lower arms/hands.  Was not wearing any sunscreen while at races over weekend.  No appointments remain  with Dr Amador Cunas for 11/24/08.  Message sent to [id number] pool for call back regarding work in appointment.   Symptoms  Reason For Call & Symptoms: Called to request Prednisone for itching arms and neck;  Caller  suspects sun poisoning.  Has papular rash on lower arms, hands and neck.  Denies sunburn.  Reviewed Health History In EMR: Yes  Reviewed Medications In EMR: Yes  Reviewed Allergies In EMR: Yes  Reviewed Surgeries / Procedures: Yes  Date of Onset of Symptoms: 11/21/2012  Treatments Tried: Benadryl  Treatments Tried Worked: Yes  Guideline(s) Used:  Rash or Redness - Widespread  Disposition Per Guideline:   See Today in Office  Reason For Disposition Reached:   Severe itching  Advice Given:  Reassurance:  There are many causes of widespread rashes and most of the time they are not serious. Common causes include viral illness (e.g., cold viruses) and allergic reactions (to a food, medicine, or environmental exposure).  Here is some care advice that should help.  For Itchy Rashes:  Wash the skin once with gentle non-perfumed soap to remove any irritants. Rinse the soap off thoroughly.  Oral Antihistamine Medication for Itching:  Take an antihistamine like diphenhydramine (Benadryl) for widespread rashes that itch. The adult dosage of Benadryl is 25-50 mg by mouth 4 times daily.  An over-the-counter antihistamine that causes less sleepiness  is loratadine (e.g., Alavert or Claritin).  Expected Course:  Most viral rashes disappear within 48 hours.  Call Back If:   You become worse  Patient Will Follow Care Advice:  YES

## 2012-11-24 NOTE — Progress Notes (Signed)
  Subjective:    Patient ID: Chad Lyons, male    DOB: 28-Dec-1965, 47 y.o.   MRN: [id number]  HPI Acute visit. Excessive sun exposure last weekend. Patient noted some redness mostly involving face and forearms. Tremendous itching. Using Benadryl without much relief. Similar episode of sun poisoning several years ago treated with prednisone which helped No history of diabetes. Patient also tried topical Benadryl spray which did not help much. He denies any fever or chills.  Past Medical History  Diagnosis Date  . Hypertension   . Diverticulitis    Past Surgical History  Procedure Laterality Date  . Colon surgery    . Laparoscopic gastric banding      reports that he quit smoking about 8 years ago. He has never used smokeless tobacco. He reports that  drinks alcohol. He reports that he does not use illicit drugs. family history includes Cancer in his maternal uncle and Heart disease in his maternal aunt, maternal uncle, and mother. Allergies  Allergen Reactions  . Hydromet (Hydrocodone-Homatropine) Itching  . Iohexol       Desc: PT DEVELOPS ITCHING AND HIVES SEVERAL HOURS AFTER INJECTION OF OMNIPAQUE   . Levofloxacin     REACTION: unspecified  . Penicillins       Review of Systems  Constitutional: Negative for fever and chills.  Skin: Positive for rash.  Neurological: Negative for dizziness.  Hematological: Negative for adenopathy.       Objective:   Physical Exam  Constitutional: He appears well-developed and well-nourished.  Cardiovascular: Normal rate and regular rhythm.   Pulmonary/Chest: Effort normal and breath sounds normal. No respiratory distress. He has no wheezes. He has no rales.  Skin:  Patient has some nonspecific mild erythema involving both forearms with excoriations. No vesicles. Nontender          Assessment & Plan:  Excessive sun exposure. He has some associated pruritus. Prednisone taper and triamcinolone 0.1% cream to use as needed.  Non-sedating antihistamine such as Allegra.

## 2012-11-24 NOTE — Telephone Encounter (Signed)
Pt given an appt today with Dr. Caryl Never at 4:30 pm.

## 2012-12-28 ENCOUNTER — Ambulatory Visit: Payer: BC Managed Care – PPO | Admitting: Internal Medicine

## 2013-03-28 ENCOUNTER — Telehealth: Payer: Self-pay | Admitting: Internal Medicine

## 2013-03-28 NOTE — Telephone Encounter (Signed)
Carol/spouse Phone/ [phone] called to request antibiotics for suspected sinus infection.  Ami is out of town; returning 03/30/14.  Afebrile.  Offered to triage symptoms to confirm how soon to be seen and provide care advise, if desired.  Informed MD orders are must be seen for antibiotics.  Requests appointment for 03/30/13; transferred to office scheduler queue for future appointment.

## 2013-05-10 ENCOUNTER — Telehealth: Payer: Self-pay | Admitting: Internal Medicine

## 2013-05-10 ENCOUNTER — Ambulatory Visit (INDEPENDENT_AMBULATORY_CARE_PROVIDER_SITE_OTHER): Payer: BC Managed Care – PPO | Admitting: Internal Medicine

## 2013-05-10 VITALS — BP 140/96 | Temp 98.5°F | Wt 307.0 lb

## 2013-05-10 DIAGNOSIS — J069 Acute upper respiratory infection, unspecified: Secondary | ICD-10-CM

## 2013-05-10 DIAGNOSIS — H01009 Unspecified blepharitis unspecified eye, unspecified eyelid: Secondary | ICD-10-CM

## 2013-05-10 DIAGNOSIS — H01006 Unspecified blepharitis left eye, unspecified eyelid: Secondary | ICD-10-CM

## 2013-05-10 MED ORDER — TOBRAMYCIN-DEXAMETHASONE 0.3-0.1 % OP OINT: TOPICAL_OINTMENT | Freq: Three times a day (TID) | OPHTHALMIC | Status: DC

## 2013-05-10 NOTE — Assessment & Plan Note (Signed)
Patient has blepharitis of left upper eyelid and possibly mild conjunctivitis. Use Tobradex ointment as directed as well as warm compresses.  Patient advised to call office if symptoms persist or worsen.

## 2013-05-10 NOTE — Progress Notes (Signed)
Subjective:    Patient ID: Chad Lyons, male    DOB: Jul 12, 1966, 47 y.o.   MRN: [id number]  HPI  47 year old African American male with history of hypertension and obesity complains of left upper eyelid swelling and redness. His symptoms started Saturday morning. He reports when he wakes up in the morning his left eye is "matted shut". He denies any eye pain or fever. He denies any changes in vision.  He does not recall rubbing his eye. He did mow the yard the day before symptoms started. He does not wear contact lenses or glasses.  2 [place] He had some sinus congestion since then. No purulent nasal drainage. He has been taking Mucinex over-the-counter for nasal congestion.  Review of Systems Negative for eye pain or visual changes    Past Medical History  Diagnosis Date  . Hypertension   . Diverticulitis     History   Social History  . Marital Status: Married    Spouse Name: N/A    Number of Children: N/A  . Years of Education: N/A   Occupational History  . Not on file.   Social History Main Topics  . Smoking status: Former Smoker    Quit date: 10/11/2004  . Smokeless tobacco: Never Used  . Alcohol Use: Yes  . Drug Use: No  . Sexual Activity: Not on file   Other Topics Concern  . Not on file   Social History Narrative  . No narrative on file    Past Surgical History  Procedure Laterality Date  . Colon surgery    . Laparoscopic gastric banding      Family History  Problem Relation Age of Onset  . Heart disease Mother   . Heart disease Maternal Aunt   . Cancer Maternal Uncle     pancreatic  . Heart disease Maternal Uncle     Allergies  Allergen Reactions  . Hydromet [Hydrocodone-Homatropine] Itching  . Iohexol       Desc: PT DEVELOPS ITCHING AND HIVES SEVERAL HOURS AFTER INJECTION OF OMNIPAQUE   . Levofloxacin     REACTION: unspecified  . Penicillins     Current Outpatient Prescriptions on File Prior to  Visit  Medication Sig Dispense Refill  . ALPRAZolam (XANAX) 0.5 MG tablet Take 1 tablet (0.5 mg total) by mouth 2 (two) times daily.  60 tablet  4  . lisinopril-hydrochlorothiazide (PRINZIDE,ZESTORETIC) 20-25 MG per tablet Take 1 tablet by mouth daily.  90 tablet  6  . sertraline (ZOLOFT) 50 MG tablet Take 1 tablet (50 mg total) by mouth daily.  90 tablet  6  . verapamil (CALAN-SR) 240 MG CR tablet Take 1 tablet (240 mg total) by mouth at bedtime.  90 tablet  4   No current facility-administered medications on file prior to visit.    BP 140/96  Temp(Src) 98.5 F (36.9 C) (Oral)  Wt 307 lb (139.254 kg)  BMI 39.4 kg/m2     Objective:   Physical Exam  Constitutional: He appears well-developed and well-nourished.  HENT:  Head: Normocephalic and atraumatic.  Right Ear: External ear normal.  Left Ear: External ear normal.  Eyes: EOM are normal. Pupils are equal, round, and reactive to light.  Left upper eyelid swollen red, no obvious drainage, left conjunctival injection  Cardiovascular: Normal rate and normal heart sounds.   Pulmonary/Chest: Effort normal and breath sounds normal. He has no wheezes.  Psychiatric: He has a normal mood and affect. His  behavior is normal.          Assessment & Plan:

## 2013-05-10 NOTE — Telephone Encounter (Signed)
Patient Information:  Caller Name: Jacquenette Shone  Phone: [phone]  Patient: Chad Lyons  Gender: Male  DOB: 03/13/1928  Age: 47 Years  PCP: Kelle Darting [facility])  Office Follow Up:  Does the office need to follow up with this patient?: Yes  Instructions For The Office: Pt insistent he speak with RAchel   Symptoms  Reason For Call & Symptoms: Pt wants to talk to [facility]. He wants to come to the office and get a sample of Insulin. He takes Novalog 70/30. He is insistent that he speak only with Fleet Contras and would not answer the nurses questions when RN advised we could call pharmacy. He is insistent on a sample. RN sent pts request  Reviewed Health History In EMR: Yes  Reviewed Medications In EMR: Yes  Reviewed Allergies In EMR: Yes  Reviewed Surgeries / Procedures: Yes  Date of Onset of Symptoms: 05/10/2013  Guideline(s) Used:  No Protocol Available - Information Only  Disposition Per Guideline:   Discuss with PCP and Callback by Nurse Today  Reason For Disposition Reached:   Nursing judgment  Advice Given:  N/A  Patient Will Follow Care Advice:  YES

## 2013-05-10 NOTE — Assessment & Plan Note (Signed)
Continue symptomatic treatment.  Likely viral URI.

## 2013-05-10 NOTE — Patient Instructions (Addendum)
Please contact our office if your symptoms do not improve or gets worse. Use warm compress to left eye 2-3 times per day as directed

## 2013-06-28 ENCOUNTER — Telehealth: Payer: Self-pay | Admitting: Internal Medicine

## 2013-06-28 ENCOUNTER — Ambulatory Visit (INDEPENDENT_AMBULATORY_CARE_PROVIDER_SITE_OTHER): Payer: BC Managed Care – PPO | Admitting: *Deleted

## 2013-06-28 DIAGNOSIS — Z23 Encounter for immunization: Secondary | ICD-10-CM

## 2013-06-28 NOTE — Telephone Encounter (Signed)
ok 

## 2013-06-28 NOTE — Telephone Encounter (Signed)
Pt would like to schedule a cpe for he and his wife together. No appt until Jan. Is it ok to sch 2 cpe together prior to end of year?

## 2013-07-04 ENCOUNTER — Other Ambulatory Visit: Payer: Self-pay | Admitting: *Deleted

## 2013-07-04 MED ORDER — LISINOPRIL-HYDROCHLOROTHIAZIDE 20-25 MG PO TABS: 1.0000 | ORAL_TABLET | Freq: Every day | ORAL | Status: DC

## 2013-07-04 MED ORDER — SERTRALINE HCL 50 MG PO TABS: 50.0000 mg | ORAL_TABLET | Freq: Every day | ORAL | Status: DC

## 2013-07-06 NOTE — Telephone Encounter (Signed)
appt sch for hus and wife

## 2013-07-15 ENCOUNTER — Encounter: Payer: Self-pay | Admitting: Family Medicine

## 2013-07-15 ENCOUNTER — Ambulatory Visit (INDEPENDENT_AMBULATORY_CARE_PROVIDER_SITE_OTHER): Payer: BC Managed Care – PPO | Admitting: Family Medicine

## 2013-07-15 VITALS — BP 130/84 | HR 66 | Temp 99.1°F | Wt 305.0 lb

## 2013-07-15 DIAGNOSIS — J329 Chronic sinusitis, unspecified: Secondary | ICD-10-CM

## 2013-07-15 DIAGNOSIS — J069 Acute upper respiratory infection, unspecified: Secondary | ICD-10-CM

## 2013-07-15 MED ORDER — AZITHROMYCIN 250 MG PO TABS: ORAL_TABLET | ORAL | Status: DC

## 2013-07-15 NOTE — Progress Notes (Signed)
Pre visit review using our clinic review tool, if applicable. No additional management support is needed unless otherwise documented below in the visit note. 

## 2013-07-15 NOTE — Patient Instructions (Signed)
INSTRUCTIONS FOR UPPER RESPIRATORY INFECTION:  -plenty of rest and fluids  -As we discussed, we have prescribed a new medication (Azithromycin) for you at this appointment. We discussed the common and serious potential adverse effects of this medication and you can review these and more with the pharmacist when you pick up your medication.  Please follow the instructions for use carefully and notify us immediately if you have any problems taking this medication.  -nasal saline wash 2-3 times daily (use prepackaged nasal saline or bottled/distilled water if making your own)   -can use sinex nasal spray for drainage and nasal congestion - but do NOT use longer then 3-4 days  -can use tylenol or ibuprofen as directed for aches and sorethroat  -in the winter time, using a humidifier at night is helpful (please follow cleaning instructions)  -if you are taking a cough medication - use only as directed, may also try a teaspoon of honey to coat the throat and throat lozenges  -for sore throat, salt water gargles can help  -follow up if you have fevers, facial pain, tooth pain, difficulty breathing or are worsening or not getting better in 5-7 days

## 2013-07-15 NOTE — Progress Notes (Signed)
Chief Complaint  Patient presents with  . Cough    deep cough, congestion, sinsu pain and pressure, mucus- yellow, fever     HPI:  -started: 6 days ago -symptoms:nasal congestion, sore throat, cough, sinus pain and pressure for last day, felt like had a fever - no objective fever, has some L upper max tooth pain -denies:fever, SOB, NVD, tooth pain -has tried: musinex DM -sick contacts/travel/risks: denies flu exposure, tick exposure or or Ebola risks -Hx of: allergies ROS: See pertinent positives and negatives per HPI.  Past Medical History  Diagnosis Date  . Hypertension   . Diverticulitis     Past Surgical History  Procedure Laterality Date  . Colon surgery    . Laparoscopic gastric banding      Family History  Problem Relation Age of Onset  . Heart disease Mother   . Heart disease Maternal Aunt   . Cancer Maternal Uncle     pancreatic  . Heart disease Maternal Uncle     History   Social History  . Marital Status: Married    Spouse Name: N/A    Number of Children: N/A  . Years of Education: N/A   Social History Main Topics  . Smoking status: Former Smoker    Quit date: 10/11/2004  . Smokeless tobacco: Never Used  . Alcohol Use: Yes  . Drug Use: No  . Sexual Activity: None   Other Topics Concern  . None   Social History Narrative  . None    Current outpatient prescriptions:ALPRAZolam (XANAX) 0.5 MG tablet, Take 1 tablet (0.5 mg total) by mouth 2 (two) times daily., Disp: 60 tablet, Rfl: 4;  lisinopril-hydrochlorothiazide (PRINZIDE,ZESTORETIC) 20-25 MG per tablet, Take 1 tablet by mouth daily., Disp: 90 tablet, Rfl: 3;  sertraline (ZOLOFT) 50 MG tablet, Take 1 tablet (50 mg total) by mouth daily., Disp: 90 tablet, Rfl: 3 tobramycin-dexamethasone (TOBRADEX) ophthalmic ointment, Place into the left eye 3 (three) times daily., Disp: 3.5 g, Rfl: 0;  azithromycin (ZITHROMAX) 250 MG tablet, 2 tabs on the first day then one per day for 4 days, Disp: 6 tablet,  Rfl: 0;  verapamil (CALAN-SR) 240 MG CR tablet, Take 1 tablet (240 mg total) by mouth at bedtime., Disp: 90 tablet, Rfl: 4  EXAM:  Filed Vitals:   07/15/13 1410  BP: 130/84  Pulse: 66  Temp: 99.1 F (37.3 C)    Body mass index is 39.14 kg/(m^2).  GENERAL: vitals reviewed and listed above, alert, oriented, appears well hydrated and in no acute distress  HEENT: atraumatic, conjunttiva clear, no obvious abnormalities on inspection of external nose and ears, normal appearance of ear canals and TMs, clear nasal congestion, mild post oropharyngeal erythema with PND, no tonsillar edema or exudate, no sinus TTP  NECK: no obvious masses on inspection  LUNGS: clear to auscultation bilaterally, no wheezes, rales or rhonchi, good air movement  CV: HRRR, no peripheral edema  MS: moves all extremities without noticeable abnormality  PSYCH: pleasant and cooperative, no obvious depression or anxiety  ASSESSMENT AND PLAN:  Discussed the following assessment and plan:  Acute upper respiratory infections of unspecified site - Plan: azithromycin (ZITHROMAX) 250 MG tablet  Sinusitis - Plan: azithromycin (ZITHROMAX) 250 MG tablet  -given HPI and exam findings today, a serious infection or illness is unlikely. We discussed potential etiologies, with VURI being most likely sinusitis possible given facial pain, history and low grade temp, and advised supportive care, abx if not improving and monitoring. We discussed treatment side  effects, likely course, antibiotic misuse, transmission, and signs of developing a serious illness. -of course, we advised to return or notify a doctor immediately if symptoms worsen or persist or new concerns arise.    Patient Instructions  INSTRUCTIONS FOR UPPER RESPIRATORY INFECTION:  -plenty of rest and fluids  -As we discussed, we have prescribed a new medication (Azithromycin) for you at this appointment. We discussed the common and serious potential adverse  effects of this medication and you can review these and more with the pharmacist when you pick up your medication.  Please follow the instructions for use carefully and notify us immediately if you have any problems taking this medication.  -nasal saline wash 2-3 times daily (use prepackaged nasal saline or bottled/distilled water if making your own)   -can use sinex nasal spray for drainage and nasal congestion - but do NOT use longer then 3-4 days  -can use tylenol or ibuprofen as directed for aches and sorethroat  -in the winter time, using a humidifier at night is helpful (please follow cleaning instructions)  -if you are taking a cough medication - use only as directed, may also try a teaspoon of honey to coat the throat and throat lozenges  -for sore throat, salt water gargles can help  -follow up if you have fevers, facial pain, tooth pain, difficulty breathing or are worsening or not getting better in 5-7 days      Chad Lyons R.

## 2013-07-20 MED ORDER — BENZONATATE 200 MG PO CAPS: 200.0000 mg | ORAL_CAPSULE | Freq: Two times a day (BID) | ORAL | Status: DC | PRN

## 2013-07-20 MED ORDER — HYDROCODONE-HOMATROPINE 5-1.5 MG/5ML PO SYRP: 5.0000 mL | ORAL_SOLUTION | Freq: Four times a day (QID) | ORAL | Status: DC | PRN

## 2013-07-25 ENCOUNTER — Encounter: Payer: Self-pay | Admitting: Internal Medicine

## 2013-07-25 ENCOUNTER — Ambulatory Visit (INDEPENDENT_AMBULATORY_CARE_PROVIDER_SITE_OTHER): Payer: BC Managed Care – PPO | Admitting: Internal Medicine

## 2013-07-25 DIAGNOSIS — Z0000 Encounter for general adult medical examination without abnormal findings: Secondary | ICD-10-CM

## 2013-07-25 DIAGNOSIS — I10 Essential (primary) hypertension: Secondary | ICD-10-CM

## 2013-07-25 LAB — CBC WITH DIFFERENTIAL/PLATELET
Basophils Absolute: 0 10*3/uL (ref 0.0–0.1)
Basophils Relative: 0.3 % (ref 0.0–3.0)
Eosinophils Absolute: 0.2 10*3/uL (ref 0.0–0.7)
Eosinophils Relative: 2.4 % (ref 0.0–5.0)
HCT: 44.2 % (ref 39.0–52.0)
Hemoglobin: 14.8 g/dL (ref 13.0–17.0)
Lymphocytes Relative: 19.2 % (ref 12.0–46.0)
Lymphs Abs: 1.5 10*3/uL (ref 0.7–4.0)
MCHC: 33.6 g/dL (ref 30.0–36.0)
MCV: 88.3 fl (ref 78.0–100.0)
Monocytes Absolute: 0.9 10*3/uL (ref 0.1–1.0)
Monocytes Relative: 11 % (ref 3.0–12.0)
Neutro Abs: 5.2 10*3/uL (ref 1.4–7.7)
Neutrophils Relative %: 67.1 % (ref 43.0–77.0)
Platelets: 228 10*3/uL (ref 150.0–400.0)
RBC: 5 Mil/uL (ref 4.22–5.81)
RDW: 13.8 % (ref 11.5–14.6)
WBC: 7.8 10*3/uL (ref 4.5–10.5)

## 2013-07-25 LAB — LIPID PANEL
Cholesterol: 170 mg/dL (ref 0–200)
HDL: 56.5 mg/dL (ref >39.00)
LDL Cholesterol: 99 mg/dL (ref 0–99)
Total CHOL/HDL Ratio: 3
Triglycerides: 74 mg/dL (ref 0.0–149.0)
VLDL: 14.8 mg/dL (ref 0.0–40.0)

## 2013-07-25 LAB — COMPREHENSIVE METABOLIC PANEL
ALT: 36 U/L (ref 0–53)
AST: 39 U/L — ABNORMAL HIGH (ref 0–37)
Albumin: 4.4 g/dL (ref 3.5–5.2)
Alkaline Phosphatase: 59 U/L (ref 39–117)
BUN: 17 mg/dL (ref 6–23)
CO2: 28 mEq/L (ref 19–32)
Calcium: 9.6 mg/dL (ref 8.4–10.5)
Chloride: 103 mEq/L (ref 96–112)
Creatinine, Ser: 0.7 mg/dL (ref 0.4–1.5)
GFR: 147.28 mL/min (ref >60.00)
Glucose, Bld: 76 mg/dL (ref 70–99)
Potassium: 3.9 mEq/L (ref 3.5–5.1)
Sodium: 139 mEq/L (ref 135–145)
Total Bilirubin: 0.7 mg/dL (ref 0.3–1.2)
Total Protein: 7.3 g/dL (ref 6.0–8.3)

## 2013-07-25 LAB — PSA: PSA: 0.16 ng/mL (ref 0.10–4.00)

## 2013-07-25 LAB — TSH: TSH: 0.52 u[IU]/mL (ref 0.35–5.50)

## 2013-07-25 MED ORDER — ALPRAZOLAM 0.5 MG PO TABS: 0.5000 mg | ORAL_TABLET | Freq: Two times a day (BID) | ORAL | Status: DC

## 2013-07-25 MED ORDER — SERTRALINE HCL 50 MG PO TABS: 50.0000 mg | ORAL_TABLET | Freq: Every day | ORAL | Status: DC

## 2013-07-25 MED ORDER — HYDROCODONE-HOMATROPINE 5-1.5 MG/5ML PO SYRP: 5.0000 mL | ORAL_SOLUTION | Freq: Four times a day (QID) | ORAL | Status: AC | PRN

## 2013-07-25 MED ORDER — LISINOPRIL-HYDROCHLOROTHIAZIDE 20-25 MG PO TABS: 1.0000 | ORAL_TABLET | Freq: Every day | ORAL | Status: DC

## 2013-07-25 NOTE — Progress Notes (Signed)
Pre-visit discussion using our clinic review tool. No additional management support is needed unless otherwise documented below in the visit note.  

## 2013-07-25 NOTE — Patient Instructions (Signed)

## 2013-07-25 NOTE — Progress Notes (Signed)
Subjective:    Patient ID: Chad Lyons, male    DOB: [birth year], 47 y.o.   MRN: [id number]  HPI Pre-visit discussion using our clinic review tool. No additional management support is needed unless otherwise documented below in the visit note.  47 year old patient who is seen today for a wellness exam. Medical problems include morbid obesity. He is status post bariatric surgery. He has a history of hypertension and hypertrophic cardiomyopathy. No concerns or complaints. No exertional chest pain or shortness of breath. Fairly sedentary however.  Past Medical History  Diagnosis Date  . Hypertension   . Diverticulitis     History   Social History  . Marital Status: Married    Spouse Name: N/A    Number of Children: N/A  . Years of Education: N/A   Occupational History  . Not on file.   Social History Main Topics  . Smoking status: Former Smoker    Quit date: 10/11/2004  . Smokeless tobacco: Never Used  . Alcohol Use: Yes  . Drug Use: No  . Sexual Activity: Not on file   Other Topics Concern  . Not on file   Social History Narrative  . No narrative on file    Past Surgical History  Procedure Laterality Date  . Colon surgery    . Laparoscopic gastric banding      Family History  Problem Relation Age of Onset  . Heart disease Mother   . Heart disease Maternal Aunt   . Cancer Maternal Uncle     pancreatic  . Heart disease Maternal Uncle     Allergies  Allergen Reactions  . Hydromet [Hydrocodone-Homatropine] Itching  . Iohexol       Desc: PT DEVELOPS ITCHING AND HIVES SEVERAL HOURS AFTER INJECTION OF OMNIPAQUE   . Levofloxacin     REACTION: unspecified  . Penicillins     Current Outpatient Prescriptions on File Prior to Visit  Medication Sig Dispense Refill  . benzonatate (TESSALON) 200 MG capsule Take 1 capsule (200 mg total) by mouth 2 (two) times daily as needed for cough.  20 capsule  0  . verapamil (CALAN-SR) 240 MG CR tablet Take 1 tablet (240 mg  total) by mouth at bedtime.  90 tablet  4   No current facility-administered medications on file prior to visit.    BP 130/90  Pulse 67  Temp(Src) 98.4 F (36.9 C) (Oral)  Resp 20  Ht 6' 1.5" (1.867 m)  Wt 309 lb (140.161 kg)  BMI 40.21 kg/m2  SpO2 98%       Review of Systems  Constitutional: Negative for fever, chills, activity change, appetite change and fatigue.  HENT: Negative for congestion, dental problem, ear pain, hearing loss, mouth sores, rhinorrhea, sinus pressure, sneezing, tinnitus, trouble swallowing and voice change.   Eyes: Negative for photophobia, pain, redness and visual disturbance.  Respiratory: Negative for apnea, cough, choking, chest tightness, shortness of breath and wheezing.   Cardiovascular: Negative for chest pain, palpitations and leg swelling.  Gastrointestinal: Negative for nausea, vomiting, abdominal pain, diarrhea, constipation, blood in stool, abdominal distention, anal bleeding and rectal pain.  Genitourinary: Negative for dysuria, urgency, frequency, hematuria, flank pain, decreased urine volume, discharge, penile swelling, scrotal swelling, difficulty urinating, genital sores and testicular pain.  Musculoskeletal: Negative for arthralgias, back pain, gait problem, joint swelling, myalgias, neck pain and neck stiffness.  Skin: Negative for color change, rash and wound.  Neurological: Negative for dizziness, tremors, seizures, syncope, facial asymmetry, speech difficulty, weakness,  light-headedness, numbness and headaches.  Hematological: Negative for adenopathy. Does not bruise/bleed easily.  Psychiatric/Behavioral: Negative for suicidal ideas, hallucinations, behavioral problems, confusion, sleep disturbance, self-injury, dysphoric mood, decreased concentration and agitation. The patient is not nervous/anxious.        Objective:   Physical Exam  Constitutional: He is oriented to person, place, and time. He appears well-developed and  well-nourished. No distress.  Obese Blood pressure 130/90  HENT:  Head: Normocephalic and atraumatic.  Right Ear: External ear normal.  Left Ear: External ear normal.  Nose: Nose normal.  Mouth/Throat: Oropharynx is clear and moist.  Eyes: Conjunctivae and EOM are normal. Pupils are equal, round, and reactive to light. No scleral icterus.  Neck: Normal range of motion. Neck supple. No JVD present. No thyromegaly present.  Cardiovascular: Normal rate, regular rhythm, normal heart sounds and intact distal pulses.  Exam reveals no gallop and no friction rub.   No murmur heard. Pulmonary/Chest: Effort normal and breath sounds normal. He exhibits no tenderness.  Abdominal: Soft. Bowel sounds are normal. He exhibits no distension and no mass. There is no tenderness.  Genitourinary: Prostate normal and penis normal. Guaiac negative stool.  Musculoskeletal: Normal range of motion. He exhibits no edema and no tenderness.  Lymphadenopathy:    He has no cervical adenopathy.  Neurological: He is alert and oriented to person, place, and time. He has normal reflexes. No cranial nerve deficit. Coordination normal.  Skin: Skin is warm and dry. No rash noted.  Psychiatric: He has a normal mood and affect. His behavior is normal.          Assessment & Plan:  Preventive health exam Morbid obesity Hypertension stable. Compliance with medications encouraged Morbid obesity. Weight loss recommended  Home blood pressure monitoring We'll review updated lab Recheck one year

## 2013-07-25 NOTE — Progress Notes (Signed)
   Subjective:    Patient ID: Chad Lyons, male    DOB: [birth year], 47 y.o.   MRN: [id number]  HPI  Wt Readings from Last 3 Encounters:  07/25/13 309 lb (140.161 kg)  07/15/13 305 lb (138.347 kg)  05/10/13 307 lb (139.254 kg)    Review of Systems     Objective:   Physical Exam        Assessment & Plan:

## 2013-07-29 ENCOUNTER — Other Ambulatory Visit: Payer: Self-pay | Admitting: *Deleted

## 2013-07-29 MED ORDER — VERAPAMIL HCL ER 240 MG PO TBCR: 240.0000 mg | EXTENDED_RELEASE_TABLET | Freq: Every day | ORAL | Status: DC

## 2013-10-02 ENCOUNTER — Emergency Department (HOSPITAL_BASED_OUTPATIENT_CLINIC_OR_DEPARTMENT_OTHER): Payer: BC Managed Care – PPO

## 2013-10-02 ENCOUNTER — Encounter (HOSPITAL_BASED_OUTPATIENT_CLINIC_OR_DEPARTMENT_OTHER): Payer: Self-pay | Admitting: Emergency Medicine

## 2013-10-02 ENCOUNTER — Emergency Department (HOSPITAL_BASED_OUTPATIENT_CLINIC_OR_DEPARTMENT_OTHER)
Admission: EM | Admit: 2013-10-02 | Discharge: 2013-10-02 | Disposition: A | Discharge status: Home / Self Care | Payer: BC Managed Care – PPO | Attending: Emergency Medicine | Admitting: Emergency Medicine

## 2013-10-02 DIAGNOSIS — M25512 Pain in left shoulder: Secondary | ICD-10-CM

## 2013-10-02 DIAGNOSIS — Z8719 Personal history of other diseases of the digestive system: Secondary | ICD-10-CM | POA: Insufficient documentation

## 2013-10-02 DIAGNOSIS — S4980XA Other specified injuries of shoulder and upper arm, unspecified arm, initial encounter: Secondary | ICD-10-CM | POA: Insufficient documentation

## 2013-10-02 DIAGNOSIS — Y939 Activity, unspecified: Secondary | ICD-10-CM | POA: Insufficient documentation

## 2013-10-02 DIAGNOSIS — I10 Essential (primary) hypertension: Secondary | ICD-10-CM | POA: Insufficient documentation

## 2013-10-02 DIAGNOSIS — S46909A Unspecified injury of unspecified muscle, fascia and tendon at shoulder and upper arm level, unspecified arm, initial encounter: Secondary | ICD-10-CM | POA: Insufficient documentation

## 2013-10-02 DIAGNOSIS — Z880 Allergy status to penicillin: Secondary | ICD-10-CM | POA: Insufficient documentation

## 2013-10-02 DIAGNOSIS — Z87891 Personal history of nicotine dependence: Secondary | ICD-10-CM | POA: Insufficient documentation

## 2013-10-02 DIAGNOSIS — Z79899 Other long term (current) drug therapy: Secondary | ICD-10-CM | POA: Insufficient documentation

## 2013-10-02 DIAGNOSIS — R296 Repeated falls: Secondary | ICD-10-CM | POA: Insufficient documentation

## 2013-10-02 DIAGNOSIS — W19XXXA Unspecified fall, initial encounter: Secondary | ICD-10-CM

## 2013-10-02 DIAGNOSIS — Y929 Unspecified place or not applicable: Secondary | ICD-10-CM | POA: Insufficient documentation

## 2013-10-02 MED ORDER — OXYCODONE-ACETAMINOPHEN 5-325 MG PO TABS: 1.0000 | ORAL_TABLET | Freq: Four times a day (QID) | ORAL | Status: DC | PRN

## 2013-10-02 MED ORDER — OXYCODONE-ACETAMINOPHEN 5-325 MG PO TABS
1.0000 | ORAL_TABLET | Freq: Once | ORAL | Status: AC
  Administered 2013-10-02: 1 via ORAL
  Filled 2013-10-02: qty 1

## 2013-10-02 MED ORDER — IBUPROFEN 600 MG PO TABS: 600.0000 mg | ORAL_TABLET | Freq: Four times a day (QID) | ORAL | Status: DC | PRN

## 2013-10-02 NOTE — ED Notes (Signed)
Patient here after becoming dizzy last pm after taking flexeril and drinking a margarita. Unsure of loc, complains of right sided frontal headache and left posterior shoulder pain. Limited ROM. ALERT AND ORIENTED, NO NAUSEA

## 2013-10-02 NOTE — ED Provider Notes (Signed)
CSN: [id number]     Arrival date & time 10/02/13  1121 History   First MD Initiated Contact with Patient 10/02/13 1356     Chief Complaint  Patient presents with  . Fall  . Shoulder Pain     (Consider location/radiation/quality/duration/timing/severity/associated sxs/prior Treatment) HPI Pt presenting with c/o pain in left shoulder after fall yesterday.  Pt states he had several drinks, took a muscle relaxer due to some low back pain, then felt weak all over and fell.  He thinks he hit his forehead, no LOC, remembers events leading up to, during and after fall. No vomiting or seizure activity.  He c/o continued left shoulder pain with movement and palpation today.  No neck or back pain.  He has not had any treatment prior to arrival.  Pain is constant  Past Medical History  Diagnosis Date  . Hypertension   . Diverticulitis    Past Surgical History  Procedure Laterality Date  . Colon surgery    . Laparoscopic gastric banding     Family History  Problem Relation Age of Onset  . Heart disease Mother   . Heart disease Maternal Aunt   . Cancer Maternal Uncle     pancreatic  . Heart disease Maternal Uncle    History  Substance Use Topics  . Smoking status: Former Smoker    Quit date: 10/11/2004  . Smokeless tobacco: Never Used  . Alcohol Use: Yes    Review of Systems ROS reviewed and all otherwise negative except for mentioned in HPI    Allergies  Hydromet; Iohexol; Levofloxacin; and Penicillins  Home Medications   Current Outpatient Rx  Name  Route  Sig  Dispense  Refill  . ALPRAZolam (XANAX) 0.5 MG tablet   Oral   Take 1 tablet (0.5 mg total) by mouth 2 (two) times daily.   60 tablet   5   . ibuprofen (ADVIL,MOTRIN) 600 MG tablet   Oral   Take 1 tablet (600 mg total) by mouth every 6 (six) hours as needed.   30 tablet   0   . lisinopril-hydrochlorothiazide (PRINZIDE,ZESTORETIC) 20-25 MG per tablet   Oral   Take 1 tablet by mouth daily.   90 tablet   3    . oxyCODONE-acetaminophen (PERCOCET/ROXICET) 5-325 MG per tablet   Oral   Take 1-2 tablets by mouth every 6 (six) hours as needed for severe pain.   15 tablet   0   . sertraline (ZOLOFT) 50 MG tablet   Oral   Take 1 tablet (50 mg total) by mouth daily.   90 tablet   3   . verapamil (CALAN-SR) 240 MG CR tablet   Oral   Take 1 tablet (240 mg total) by mouth at bedtime.   90 tablet   1    BP 140/84  Pulse 65  Temp(Src) 98.6 F (37 C) (Oral)  Resp 20  Ht 6\' 3"  (1.905 m)  Wt 285 lb (129.275 kg)  BMI 35.62 kg/m2  SpO2 98% Vitals reviewed Physical Exam Physical Examination: General appearance - alert, well appearing, and in no distress Mental status - alert, oriented to person, place, and time Eyes - pupils equal and reactive, no conjunctival injection, no scleral icterus Neck - supple, no significant adenopathy Chest - clear to auscultation, no wheezes, rales or rhonchi, symmetric air entry Heart - normal rate, regular rhythm, normal S1, S2, no murmurs, rubs, clicks or gallops Back exam - full range of motion, no tenderness, palpable spasm  or pain on motion Neurological - alert, oriented, normal speech, strength 5/5 in extremities x 4, sensation intact Musculoskeletal - no joint tenderness, deformity or swelling Extremities - peripheral pulses normal, no pedal edema, no clubbing or cyanosis Skin - normal coloration and turgor, no rashes  ED Course  Procedures (including critical care time) Labs Review Labs Reviewed - No data to display Imaging Review Dg Shoulder Left  10/02/2013   CLINICAL DATA:  Pain post fall.  EXAM: LEFT SHOULDER - 2+ VIEW  COMPARISON:  Chest x-ray 02/17/2006  FINDINGS: There is no evidence of fracture or dislocation. There is no evidence of arthropathy or other focal bone abnormality. Soft tissues are unremarkable.  IMPRESSION: Negative.   Electronically Signed   By: Marin Olp M.D.   On: 10/02/2013 12:12    EKG Interpretation   None        MDM   Final diagnoses:  Shoulder pain, left  Fall    Pt presenting with c/o pain in left shoulder after fall.  Pt states he did not lose consciousness either before or after the fall.  Shoulder xrays reassuring.   Xray images reviewed and interpreted by me as well. Given pain medication for pain.  Advised f/u with ortho if symptoms persist or worsen.  No indication for imaging of head given no LOC, no vomiting, no seizure activity. Discharged with strict return precautions.  Pt agreeable with plan.     Threasa Beards, MD 10/03/13 [phone]

## 2013-10-02 NOTE — Discharge Instructions (Signed)
Return to the ED with any concerns including increased pain, swelling of arm, numbness or weakness of hand or arm, dizziness, fainting, vomiting, seizure activity, decreased level of alertness/lethargy, or any other alarming symptoms

## 2013-10-17 ENCOUNTER — Other Ambulatory Visit: Payer: Self-pay | Admitting: Family Medicine

## 2014-01-17 ENCOUNTER — Telehealth: Payer: Self-pay | Admitting: Internal Medicine

## 2014-01-17 NOTE — Telephone Encounter (Signed)
[facility] [place] - [address] Is requesting re-fill on triamcinolone cream (KENALOG) 0.1 %

## 2014-01-18 MED ORDER — TRIAMCINOLONE ACETONIDE 0.1 % EX CREA: 1.0000 "application " | TOPICAL_CREAM | Freq: Two times a day (BID) | CUTANEOUS | Status: DC

## 2014-01-18 NOTE — Telephone Encounter (Signed)
Rx sent 

## 2014-03-03 ENCOUNTER — Encounter: Payer: Self-pay | Admitting: Internal Medicine

## 2014-03-03 ENCOUNTER — Ambulatory Visit (INDEPENDENT_AMBULATORY_CARE_PROVIDER_SITE_OTHER): Payer: BC Managed Care – PPO | Admitting: Internal Medicine

## 2014-03-03 VITALS — BP 120/78 | HR 58 | Temp 98.2°F | Resp 20 | Ht 75.0 in | Wt 302.0 lb

## 2014-03-03 DIAGNOSIS — I10 Essential (primary) hypertension: Secondary | ICD-10-CM

## 2014-03-03 DIAGNOSIS — I421 Obstructive hypertrophic cardiomyopathy: Secondary | ICD-10-CM

## 2014-03-03 DIAGNOSIS — H8110 Benign paroxysmal vertigo, unspecified ear: Secondary | ICD-10-CM

## 2014-03-03 MED ORDER — MECLIZINE HCL 25 MG PO TABS: 25.0000 mg | ORAL_TABLET | Freq: Three times a day (TID) | ORAL | Status: DC | PRN

## 2014-03-03 NOTE — Progress Notes (Signed)
Pre visit review using our clinic review tool, if applicable. No additional management support is needed unless otherwise documented below in the visit note. 

## 2014-03-03 NOTE — Progress Notes (Signed)
Subjective:    Patient ID: Chad Lyons, male    DOB: March 27, 1966, 48 y.o.   MRN: [id number]  HPI  BP Readings from Last 3 Encounters:  03/03/14 120/78  10/02/13 140/84  07/25/13 130/90    Wt Readings from Last 3 Encounters:  03/03/14 302 lb (136.986 kg)  10/02/13 285 lb (129.275 kg)  07/25/13 309 lb (140.41 kg)    48 year old patient who has a five-day history of positional vertigo.  Symptoms are aggravated by movement, especially turning of the head and neck area.  The past 2 days.  He is also having mild headache.  He has treated hypertension and a history of hypertrophic cardiomyopathy  Past Medical History  Diagnosis Date  . Hypertension   . Diverticulitis     History   Social History  . Marital Status: Married    Spouse Name: N/A    Number of Children: N/A  . Years of Education: N/A   Occupational History  . Not on file.   Social History Main Topics  . Smoking status: Former Smoker    Quit date: 10/11/2004  . Smokeless tobacco: Never Used  . Alcohol Use: Yes  . Drug Use: No  . Sexual Activity: Not on file   Other Topics Concern  . Not on file   Social History Narrative  . No narrative on file    Past Surgical History  Procedure Laterality Date  . Colon surgery    . Laparoscopic gastric banding      Family History  Problem Relation Age of Onset  . Heart disease Mother   . Heart disease Maternal Aunt   . Cancer Maternal Uncle     pancreatic  . Heart disease Maternal Uncle     Allergies  Allergen Reactions  . Hydromet [Hydrocodone-Homatropine] Itching  . Iohexol       Desc: PT DEVELOPS ITCHING AND HIVES SEVERAL HOURS AFTER INJECTION OF OMNIPAQUE   . Levofloxacin     REACTION: unspecified  . Penicillins     Current Outpatient Prescriptions on File Prior to Visit  Medication Sig Dispense Refill  . ALPRAZolam (XANAX) 0.5 MG tablet Take 1 tablet (0.5 mg total) by mouth 2 (two) times daily.  60 tablet  5  . ibuprofen (ADVIL,MOTRIN)  600 MG tablet Take 1 tablet (600 mg total) by mouth every 6 (six) hours as needed.  30 tablet  0  . lisinopril-hydrochlorothiazide (PRINZIDE,ZESTORETIC) 20-25 MG per tablet Take 1 tablet by mouth daily.  90 tablet  3  . sertraline (ZOLOFT) 50 MG tablet Take 1 tablet (50 mg total) by mouth daily.  90 tablet  3  . triamcinolone cream (KENALOG) 0.1 % Apply 1 application topically 2 (two) times daily.  30 g  1  . verapamil (CALAN-SR) 240 MG CR tablet Take 1 tablet (240 mg total) by mouth at bedtime.  90 tablet  1   No current facility-administered medications on file prior to visit.    BP 120/78  Pulse 58  Temp(Src) 98.2 F (36.8 C) (Oral)  Resp 20  Ht 6\' 3"  (1.905 m)  Wt 302 lb (136.986 kg)  BMI 37.75 kg/m2  SpO2 98%      Review of Systems  Constitutional: Negative for fever, chills, appetite change and fatigue.  HENT: Negative for congestion, dental problem, ear pain, hearing loss, sore throat, tinnitus, trouble swallowing and voice change.   Eyes: Negative for pain, discharge and visual disturbance.  Respiratory: Negative for cough, chest tightness, wheezing and  stridor.   Cardiovascular: Negative for chest pain, palpitations and leg swelling.  Gastrointestinal: Negative for nausea, vomiting, abdominal pain, diarrhea, constipation, blood in stool and abdominal distention.  Genitourinary: Negative for urgency, hematuria, flank pain, discharge, difficulty urinating and genital sores.  Musculoskeletal: Negative for arthralgias, back pain, gait problem, joint swelling, myalgias and neck stiffness.  Skin: Negative for rash.  Neurological: Positive for dizziness, weakness, light-headedness and headaches. Negative for syncope, speech difficulty and numbness.  Hematological: Negative for adenopathy. Does not bruise/bleed easily.  Psychiatric/Behavioral: Negative for behavioral problems and dysphoric mood. The patient is not nervous/anxious.        Objective:   Physical Exam    Constitutional: He is oriented to person, place, and time. He appears well-developed. No distress.  Blood pressure 120/78 Frequent brief vertigo with positional change in the office  HENT:  Head: Normocephalic.  Right Ear: External ear normal.  Left Ear: External ear normal.  Eyes: Conjunctivae and EOM are normal.  Neck: Normal range of motion.  Cardiovascular: Normal rate and normal heart sounds.   Pulmonary/Chest: Breath sounds normal.  Abdominal: Bowel sounds are normal.  Musculoskeletal: Normal range of motion. He exhibits no edema and no tenderness.  Neurological: He is alert and oriented to person, place, and time. He has normal reflexes. No cranial nerve deficit. Coordination normal.  Psychiatric: He has a normal mood and affect. His behavior is normal.          Assessment & Plan:   Paroxysmal benign positional vertigo.  We'll treat with meclizine.  Was given information on repositioning maneuvers. Hypertension stable Morbid obesity

## 2014-03-03 NOTE — Patient Instructions (Addendum)
Call or return to clinic prn if these symptoms worsen or fail to improve as anticipated.  Benign Positional Vertigo Vertigo means you feel like you or your surroundings are moving when they are not. Benign positional vertigo is the most common form of vertigo. Benign means that the cause of your condition is not serious. Benign positional vertigo is more common in older adults. CAUSES  Benign positional vertigo is the result of an upset in the labyrinth system. This is an area in the middle ear that helps control your balance. This may be caused by a viral infection, head injury, or repetitive motion. However, often no specific cause is found. SYMPTOMS  Symptoms of benign positional vertigo occur when you move your head or eyes in different directions. Some of the symptoms may include:  Loss of balance and falls.  Vomiting.  Blurred vision.  Dizziness.  Nausea.  Involuntary eye movements (nystagmus). DIAGNOSIS  Benign positional vertigo is usually diagnosed by physical exam. If the specific cause of your benign positional vertigo is unknown, your caregiver may perform imaging tests, such as magnetic resonance imaging (MRI) or computed tomography (CT). TREATMENT  Your caregiver may recommend movements or procedures to correct the benign positional vertigo. Medicines such as meclizine, benzodiazepines, and medicines for nausea may be used to treat your symptoms. In rare cases, if your symptoms are caused by certain conditions that affect the inner ear, you may need surgery. HOME CARE INSTRUCTIONS   Follow your caregiver's instructions.  Move slowly. Do not make sudden body or head movements.  Avoid driving.  Avoid operating heavy machinery.  Avoid performing any tasks that would be dangerous to you or others during a vertigo episode.  Drink enough fluids to keep your urine clear or pale yellow. SEEK IMMEDIATE MEDICAL CARE IF:   You develop problems with walking, weakness,  numbness, or using your arms, hands, or legs.  You have difficulty speaking.  You develop severe headaches.  Your nausea or vomiting continues or gets worse.  You develop visual changes.  Your family or friends notice any behavioral changes.  Your condition gets worse.  You have a fever.  You develop a stiff neck or sensitivity to light. MAKE SURE YOU:   Understand these instructions.  Will watch your condition.  Will get help right away if you are not doing well or get worse. Document Released: 05/12/2006 Document Revised: 10/27/2011 Document Reviewed: 04/24/2011 [employer] Patient Information 2015 [employer]. This information is not intended to replace advice given to you by your health care provider. Make sure you discuss any questions you have with your health care provider.  

## 2014-03-23 ENCOUNTER — Encounter: Payer: Self-pay | Admitting: Internal Medicine

## 2014-03-23 ENCOUNTER — Ambulatory Visit (INDEPENDENT_AMBULATORY_CARE_PROVIDER_SITE_OTHER): Payer: BC Managed Care – PPO | Admitting: Internal Medicine

## 2014-03-23 VITALS — BP 120/82 | HR 64 | Temp 98.3°F | Resp 20 | Ht 75.0 in | Wt 298.0 lb

## 2014-03-23 DIAGNOSIS — R590 Localized enlarged lymph nodes: Secondary | ICD-10-CM

## 2014-03-23 DIAGNOSIS — R599 Enlarged lymph nodes, unspecified: Secondary | ICD-10-CM

## 2014-03-23 DIAGNOSIS — I421 Obstructive hypertrophic cardiomyopathy: Secondary | ICD-10-CM

## 2014-03-23 MED ORDER — CEFUROXIME AXETIL 500 MG PO TABS: 500.0000 mg | ORAL_TABLET | Freq: Two times a day (BID) | ORAL | Status: DC

## 2014-03-23 MED ORDER — MECLIZINE HCL 25 MG PO TABS: 25.0000 mg | ORAL_TABLET | Freq: Three times a day (TID) | ORAL | Status: DC | PRN

## 2014-03-23 MED ORDER — OXYCODONE-ACETAMINOPHEN 5-300 MG PO TABS: 1.0000 | ORAL_TABLET | ORAL | Status: DC | PRN

## 2014-03-23 NOTE — Progress Notes (Signed)
Subjective:    Patient ID: Chad Lyons, male    DOB: 05/24/1966, 48 y.o.   MRN: [id number]  HPI  48 year old patient, who presents with a two-day history of left neck pain and tenderness.  Denies any fever or other constitutional complaints.  He has a very difficult time moving his head side to side due to the left-sided discomfort.  He was seen last month for some positional vertigo, which has been stable.  He has a history of hypertension and hypertrophic cardiomyopathy.  He describes some mild left ear pain, but no real sinus congestion, or drainage.  He slept poorly last night due to the pain, which is aggravated by movement of the head and neck  Past Medical History  Diagnosis Date  . Hypertension   . Diverticulitis     History   Social History  . Marital Status: Married    Spouse Name: N/A    Number of Children: N/A  . Years of Education: N/A   Occupational History  . Not on file.   Social History Main Topics  . Smoking status: Former Smoker    Quit date: 10/11/2004  . Smokeless tobacco: Never Used  . Alcohol Use: Yes  . Drug Use: No  . Sexual Activity: Not on file   Other Topics Concern  . Not on file   Social History Narrative  . No narrative on file    Past Surgical History  Procedure Laterality Date  . Colon surgery    . Laparoscopic gastric banding      Family History  Problem Relation Age of Onset  . Heart disease Mother   . Heart disease Maternal Aunt   . Cancer Maternal Uncle     pancreatic  . Heart disease Maternal Uncle     Allergies  Allergen Reactions  . Hydromet [Hydrocodone-Homatropine] Itching  . Iohexol       Desc: PT DEVELOPS ITCHING AND HIVES SEVERAL HOURS AFTER INJECTION OF OMNIPAQUE   . Levofloxacin     REACTION: unspecified  . Penicillins     Current Outpatient Prescriptions on File Prior to Visit  Medication Sig Dispense Refill  . ALPRAZolam (XANAX) 0.5 MG tablet Take 1 tablet (0.5 mg total) by mouth 2 (two) times  daily.  60 tablet  5  . ibuprofen (ADVIL,MOTRIN) 600 MG tablet Take 1 tablet (600 mg total) by mouth every 6 (six) hours as needed.  30 tablet  0  . lisinopril-hydrochlorothiazide (PRINZIDE,ZESTORETIC) 20-25 MG per tablet Take 1 tablet by mouth daily.  90 tablet  3  . sertraline (ZOLOFT) 50 MG tablet Take 1 tablet (50 mg total) by mouth daily.  90 tablet  3  . triamcinolone cream (KENALOG) 0.1 % Apply 1 application topically 2 (two) times daily.  30 g  1  . verapamil (CALAN-SR) 240 MG CR tablet Take 1 tablet (240 mg total) by mouth at bedtime.  90 tablet  1   No current facility-administered medications on file prior to visit.    BP 120/82  Pulse 64  Temp(Src) 98.3 F (36.8 C) (Oral)  Resp 20  Ht 6\' 3"  (1.905 m)  Wt 298 lb (135.172 kg)  BMI 37.25 kg/m2  SpO2 98%     Review of Systems  Constitutional: Positive for activity change. Negative for fever, chills, diaphoresis, appetite change and fatigue.  HENT: Negative for congestion, dental problem, ear pain, hearing loss, sore throat, tinnitus, trouble swallowing and voice change.   Eyes: Negative for pain, discharge and  visual disturbance.  Respiratory: Negative for cough, chest tightness, wheezing and stridor.   Cardiovascular: Negative for chest pain, palpitations and leg swelling.  Gastrointestinal: Negative for nausea, vomiting, abdominal pain, diarrhea, constipation, blood in stool and abdominal distention.  Genitourinary: Negative for urgency, hematuria, flank pain, discharge, difficulty urinating and genital sores.  Musculoskeletal: Positive for neck pain. Negative for arthralgias, back pain, gait problem, joint swelling, myalgias and neck stiffness.  Skin: Negative for rash.  Neurological: Negative for dizziness, syncope, speech difficulty, weakness, numbness and headaches.  Hematological: Positive for adenopathy. Does not bruise/bleed easily.  Psychiatric/Behavioral: Negative for behavioral problems and dysphoric mood. The  patient is not nervous/anxious.        Objective:   Physical Exam  Constitutional: He is oriented to person, place, and time. He appears well-developed and well-nourished. No distress.  Afebrile Nontoxic Marked decreased range of motion of the neck  HENT:  Head: Normocephalic.  Right Ear: External ear normal.  Left Ear: External ear normal.  Oral pharynx and tympanic membranes appeared unremarkable No focal sinus tenderness  Eyes: Conjunctivae and EOM are normal.  Neck: Normal range of motion.  Patient able to flex and extend neck very difficult  lateral range of motion  Tender left submandibular adenopathy There is also tenderness in the left posterior neck area, but no discrete adenopathy could be appreciated  Cardiovascular: Normal rate and normal heart sounds.   Pulmonary/Chest: Breath sounds normal. No respiratory distress. He has no wheezes. He has no rales.  Abdominal: Bowel sounds are normal. There is no tenderness.  Patient was unable to lie supine for a GI exam due to the severe back pain  Musculoskeletal: Normal range of motion. He exhibits no edema and no tenderness.  Lymphadenopathy:    He has cervical adenopathy.  Neurological: He is alert and oriented to person, place, and time.  Psychiatric: He has a normal mood and affect. His behavior is normal.          Assessment & Plan:   Left cervical lymphadenopathy.  Patient is afebrile and does not appear toxic, but quite uncomfortable with localized discomfort.  He states he took a Percocet last night with some benefit.  He does have allergies to both Levaquin and penicillins, but apparently have caused mild skin rashes  Only. We'll check a CBC and treat empirically with Ceftin for 7 days.  He will call up there is any clinical deterioration.  Otherwise, will recheck next week Hypertension stable

## 2014-03-23 NOTE — Progress Notes (Signed)
Pre visit review using our clinic review tool, if applicable. No additional management support is needed unless otherwise documented below in the visit note. 

## 2014-03-23 NOTE — Patient Instructions (Signed)
Call or return to clinic prn if these symptoms worsen or fail to improve as anticipated.

## 2014-03-24 LAB — CBC WITH DIFFERENTIAL/PLATELET
Basophils Absolute: 0 10*3/uL (ref 0.0–0.1)
Basophils Relative: 0.2 % (ref 0.0–3.0)
Eosinophils Absolute: 0.2 10*3/uL (ref 0.0–0.7)
Eosinophils Relative: 1.9 % (ref 0.0–5.0)
HCT: 44.8 % (ref 39.0–52.0)
Hemoglobin: 14.9 g/dL (ref 13.0–17.0)
Lymphocytes Relative: 11.9 % — ABNORMAL LOW (ref 12.0–46.0)
Lymphs Abs: 1.3 10*3/uL (ref 0.7–4.0)
MCHC: 33.3 g/dL (ref 30.0–36.0)
MCV: 90.8 fl (ref 78.0–100.0)
Monocytes Absolute: 1.3 10*3/uL — ABNORMAL HIGH (ref 0.1–1.0)
Monocytes Relative: 11.1 % (ref 3.0–12.0)
Neutro Abs: 8.4 10*3/uL — ABNORMAL HIGH (ref 1.4–7.7)
Neutrophils Relative %: 74.9 % (ref 43.0–77.0)
Platelets: 215 10*3/uL (ref 150.0–400.0)
RBC: 4.93 Mil/uL (ref 4.22–5.81)
RDW: 14 % (ref 11.5–15.5)
WBC: 11.3 10*3/uL — ABNORMAL HIGH (ref 4.0–10.5)

## 2014-04-17 ENCOUNTER — Other Ambulatory Visit: Payer: Self-pay | Admitting: Internal Medicine

## 2014-04-18 ENCOUNTER — Encounter: Payer: Self-pay | Admitting: Internal Medicine

## 2014-04-18 ENCOUNTER — Ambulatory Visit (INDEPENDENT_AMBULATORY_CARE_PROVIDER_SITE_OTHER): Payer: BC Managed Care – PPO | Admitting: Internal Medicine

## 2014-04-18 VITALS — BP 158/100 | HR 70 | Temp 98.8°F | Resp 20 | Ht 75.0 in | Wt 300.0 lb

## 2014-04-18 DIAGNOSIS — I10 Essential (primary) hypertension: Secondary | ICD-10-CM

## 2014-04-18 DIAGNOSIS — J069 Acute upper respiratory infection, unspecified: Secondary | ICD-10-CM

## 2014-04-18 MED ORDER — AZITHROMYCIN 250 MG PO TABS: ORAL_TABLET | ORAL | Status: DC

## 2014-04-18 NOTE — Progress Notes (Signed)
Subjective:    Patient ID: Chad Lyons, male    DOB: 1965-12-15, 48 y.o.   MRN: [id number]  HPI 48 year old patient who has a history of hypertension, hypertrophic cardiomyopathy, who presents with a several-day history of worsening sinus and chest congestion.  He has developed a productive cough, fever, and general sense of wellness.  Denies any shortness of breath or wheezing.  He has been using Mucinex without benefit.  He describes low-grade fever.  Past Medical History  Diagnosis Date  . Hypertension   . Diverticulitis     History   Social History  . Marital Status: Married    Spouse Name: N/A    Number of Children: N/A  . Years of Education: N/A   Occupational History  . Not on file.   Social History Main Topics  . Smoking status: Former Smoker    Quit date: 10/11/2004  . Smokeless tobacco: Never Used  . Alcohol Use: Yes  . Drug Use: No  . Sexual Activity: Not on file   Other Topics Concern  . Not on file   Social History Narrative  . No narrative on file    Past Surgical History  Procedure Laterality Date  . Colon surgery    . Laparoscopic gastric banding      Family History  Problem Relation Age of Onset  . Heart disease Mother   . Heart disease Maternal Aunt   . Cancer Maternal Uncle     pancreatic  . Heart disease Maternal Uncle     Allergies  Allergen Reactions  . Hydromet [Hydrocodone-Homatropine] Itching  . Iohexol       Desc: PT DEVELOPS ITCHING AND HIVES SEVERAL HOURS AFTER INJECTION OF OMNIPAQUE   . Levofloxacin     REACTION: unspecified  . Penicillins     Current Outpatient Prescriptions on File Prior to Visit  Medication Sig Dispense Refill  . ALPRAZolam (XANAX) 0.5 MG tablet Take 1 tablet (0.5 mg total) by mouth 2 (two) times daily.  60 tablet  5  . ibuprofen (ADVIL,MOTRIN) 600 MG tablet TAKE 1 TABLET BY MOUTH EVERY 6 HOURS AS NEEDED  30 tablet  2  . lisinopril-hydrochlorothiazide (PRINZIDE,ZESTORETIC) 20-25 MG per tablet  Take 1 tablet by mouth daily.  90 tablet  3  . meclizine (ANTIVERT) 25 MG tablet Take 1 tablet (25 mg total) by mouth 3 (three) times daily as needed for dizziness.  30 tablet  2  . oxycodone-acetaminophen (LYNOX) 5-300 MG per tablet Take 1 tablet by mouth every 4 (four) hours as needed for pain.  30 tablet  0  . sertraline (ZOLOFT) 50 MG tablet Take 1 tablet (50 mg total) by mouth daily.  90 tablet  3  . triamcinolone cream (KENALOG) 0.1 % Apply 1 application topically 2 (two) times daily.  30 g  1  . verapamil (CALAN-SR) 240 MG CR tablet Take 1 tablet (240 mg total) by mouth at bedtime.  90 tablet  1   No current facility-administered medications on file prior to visit.    BP 158/100  Pulse 70  Temp(Src) 98.8 F (37.1 C) (Oral)  Resp 20  Ht 6\' 3"  (1.905 m)  Wt 300 lb (136.079 kg)  BMI 37.50 kg/m2  SpO2 98%       Review of Systems  Constitutional: Positive for activity change, appetite change and fatigue. Negative for fever and chills.  HENT: Positive for congestion and sinus pressure. Negative for dental problem, ear pain, hearing loss, sore throat, tinnitus,  trouble swallowing and voice change.   Eyes: Negative for pain, discharge and visual disturbance.  Respiratory: Positive for cough. Negative for chest tightness, wheezing and stridor.   Cardiovascular: Negative for chest pain, palpitations and leg swelling.  Gastrointestinal: Negative for nausea, vomiting, abdominal pain, diarrhea, constipation, blood in stool and abdominal distention.  Genitourinary: Negative for urgency, hematuria, flank pain, discharge, difficulty urinating and genital sores.  Musculoskeletal: Negative for arthralgias, back pain, gait problem, joint swelling, myalgias and neck stiffness.  Skin: Negative for rash.  Neurological: Negative for dizziness, syncope, speech difficulty, weakness, numbness and headaches.  Hematological: Negative for adenopathy. Does not bruise/bleed easily.    Psychiatric/Behavioral: Negative for behavioral problems and dysphoric mood. The patient is not nervous/anxious.        Objective:   Physical Exam  Constitutional: He is oriented to person, place, and time. He appears well-developed and well-nourished. No distress.  Appears unwell, but in no acute distress Temperature 98.8  HENT:  Head: Normocephalic.  Right Ear: External ear normal.  Left Ear: External ear normal.  Eyes: Conjunctivae and EOM are normal.  Neck: Normal range of motion.  Cardiovascular: Normal rate and normal heart sounds.   Pulmonary/Chest: He has rales.  Scattered coarse rhonchi and rales most marked at the right lower lung field  Abdominal: Bowel sounds are normal.  Musculoskeletal: Normal range of motion. He exhibits no edema and no tenderness.  Neurological: He is alert and oriented to person, place, and time.  Psychiatric: He has a normal mood and affect. His behavior is normal.          Assessment & Plan:   URI.  Possible early right lower lobe pneumonia.  We'll continue expectorants, and treat with azithromycin Hypertension.  Generally, has been very well controlled, aggravated today by acute illness and possibly Mucinex D Obesity

## 2014-04-18 NOTE — Patient Instructions (Signed)
Acute bronchitis symptoms for less than 10 days are generally not helped by antibiotics.  Take over-the-counter expectorants and cough medications such as  Mucinex DM.  Call if there is no improvement in 5 to 7 days or if  you develop worsening cough, fever, or new symptoms, such as shortness of breath or chest pain.  Take your antibiotic as prescribed until ALL of it is gone, but stop if you develop a rash, swelling, or any side effects of the medication.  Contact our office as soon as possible if  there are side effects of the medication. 

## 2014-04-18 NOTE — Progress Notes (Signed)
   Subjective:    Patient ID: Chad Lyons, male    DOB: 01-25-1966, 48 y.o.   MRN: [id number]  HPI  BP Readings from Last 3 Encounters:  04/18/14 158/100  03/23/14 120/82  03/03/14 120/78    Review of Systems     Objective:   Physical Exam        Assessment & Plan:

## 2014-04-18 NOTE — Progress Notes (Signed)
Pre visit review using our clinic review tool, if applicable. No additional management support is needed unless otherwise documented below in the visit note. 

## 2014-05-05 ENCOUNTER — Other Ambulatory Visit: Payer: Self-pay | Admitting: Internal Medicine

## 2014-08-15 ENCOUNTER — Other Ambulatory Visit: Payer: Self-pay | Admitting: *Deleted

## 2014-08-15 MED ORDER — SERTRALINE HCL 50 MG PO TABS: 50.0000 mg | ORAL_TABLET | Freq: Every day | ORAL | Status: DC

## 2014-08-15 MED ORDER — LISINOPRIL-HYDROCHLOROTHIAZIDE 20-25 MG PO TABS: 1.0000 | ORAL_TABLET | Freq: Every day | ORAL | Status: DC

## 2014-08-15 NOTE — Addendum Note (Signed)
Addended by: Townsend Roger D on: 08/15/2014 10:57 AM   Modules accepted: Orders

## 2014-08-31 ENCOUNTER — Other Ambulatory Visit: Payer: Self-pay | Admitting: Internal Medicine

## 2014-12-13 ENCOUNTER — Other Ambulatory Visit: Payer: Self-pay | Admitting: General Surgery

## 2014-12-14 ENCOUNTER — Other Ambulatory Visit: Payer: Self-pay | Admitting: Internal Medicine

## 2014-12-28 ENCOUNTER — Ambulatory Visit (INDEPENDENT_AMBULATORY_CARE_PROVIDER_SITE_OTHER): Payer: BLUE CROSS/BLUE SHIELD | Admitting: Family Medicine

## 2014-12-28 ENCOUNTER — Encounter: Payer: Self-pay | Admitting: Family Medicine

## 2014-12-28 VITALS — BP 140/80 | HR 68 | Temp 98.6°F | Wt 312.0 lb

## 2014-12-28 DIAGNOSIS — J018 Other acute sinusitis: Secondary | ICD-10-CM

## 2014-12-28 MED ORDER — AZITHROMYCIN 250 MG PO TABS: ORAL_TABLET | ORAL | Status: AC

## 2014-12-28 MED ORDER — ALPRAZOLAM 0.5 MG PO TABS: 0.5000 mg | ORAL_TABLET | Freq: Two times a day (BID) | ORAL | Status: DC

## 2014-12-28 NOTE — Progress Notes (Signed)
   Subjective:    Patient ID: Chad Lyons, male    DOB: [birth year], 49 y.o.   MRN: [id number]  HPI Acute visit. Patient seen with ulnar one-week history of cough, sinus pressure, possible low-grade fever, sweats, headaches. He is concerned he is developing sinusitis. He states he has had viral infections in the past that are frequently turned into bacterial sinusitis. He has taken Mucinex D without much improvement. Allergy to penicillin and Levaquin. No sick contacts.  Past Medical History  Diagnosis Date  . Hypertension   . Diverticulitis    Past Surgical History  Procedure Laterality Date  . Colon surgery    . Laparoscopic gastric banding      reports that he quit smoking about 10 years ago. He has never used smokeless tobacco. He reports that he drinks alcohol. He reports that he does not use illicit drugs. family history includes Cancer in his maternal uncle; Heart disease in his maternal aunt, maternal uncle, and mother. Allergies  Allergen Reactions  . Hydromet [Hydrocodone-Homatropine] Itching  . Iohexol       Desc: PT DEVELOPS ITCHING AND HIVES SEVERAL HOURS AFTER INJECTION OF OMNIPAQUE   . Levofloxacin     REACTION: unspecified  . Penicillins       Review of Systems  Constitutional: Positive for fever and fatigue. Negative for chills.  HENT: Positive for congestion and sinus pressure.   Respiratory: Positive for cough. Negative for shortness of breath.   Neurological: Positive for headaches.       Objective:   Physical Exam  Constitutional: He appears well-developed and well-nourished.  HENT:  Right Ear: External ear normal.  Left Ear: External ear normal.  Mouth/Throat: Oropharynx is clear and moist.  Nasal mucosa erythematous with swollen turbinates bilaterally.  Neck: Neck supple.  Cardiovascular: Normal rate and regular rhythm.   Pulmonary/Chest: Effort normal and breath sounds normal. No respiratory distress. He has no wheezes. He has no rales.    Lymphadenopathy:    He has no cervical adenopathy.          Assessment & Plan:  Acute sinusitis. Continue Mucinex D. Start Zithromax. Touch base in one week if no improvement

## 2014-12-28 NOTE — Progress Notes (Signed)
Pre visit review using our clinic review tool, if applicable. No additional management support is needed unless otherwise documented below in the visit note. 

## 2014-12-28 NOTE — Patient Instructions (Signed)

## 2015-02-13 NOTE — Patient Instructions (Addendum)
YOUR PROCEDURE IS SCHEDULED ON : 02/20/15  REPORT TO [facility] MAIN ENTRANCE FOLLOW SIGNS TO EAST ELEVATOR - GO TO 3rd FLOOR CHECK IN AT 3 EAST NURSES STATION (SHORT STAY) AT: 5:30 AM  CALL THIS NUMBER IF YOU HAVE PROBLEMS THE MORNING OF SURGERY [phone]  REMEMBER:ONLY 1 PER PERSON MAY GO TO SHORT STAY WITH YOU TO GET READY THE MORNING OF YOUR SURGERY  DO NOT EAT FOOD OR DRINK LIQUIDS AFTER MIDNIGHT  TAKE THESE MEDICINES THE MORNING OF SURGERY:  SERTRALINE / MAY TAKE ALPRAZOLAM IF NEEDED  STOP ASPIRIN / IBUPROFEN / ALEVE / VITAMINS / HERBAL MEDS __5_ DAYS BEFORE SURGERY  YOU MAY NOT HAVE ANY METAL ON YOUR BODY INCLUDING HAIR PINS AND PIERCING'S. DO NOT WEAR JEWELRY, MAKEUP, LOTIONS, POWDERS OR PERFUMES. DO NOT WEAR NAIL POLISH. DO NOT SHAVE 48 HRS PRIOR TO SURGERY. MEN MAY SHAVE FACE AND NECK.  DO NOT [facility]. [facility] IS NOT RESPONSIBLE FOR VALUABLES.  CONTACTS, DENTURES OR PARTIALS MAY NOT BE WORN TO SURGERY. LEAVE SUITCASE IN CAR. CAN BE BROUGHT TO ROOM AFTER SURGERY.  PATIENTS DISCHARGED THE DAY OF SURGERY WILL NOT BE ALLOWED TO DRIVE HOME.  PLEASE READ OVER THE FOLLOWING INSTRUCTION SHEETS _________________________________________________________________________________                                          [facility] - PREPARING FOR SURGERY  Before surgery, you can play an important role.  Because skin is not sterile, your skin needs to be as free of germs as possible.  You can reduce the number of germs on your skin by washing with CHG (chlorahexidine gluconate) soap before surgery.  CHG is an antiseptic cleaner which kills germs and bonds with the skin to continue killing germs even after washing. Please DO NOT use if you have an allergy to CHG or antibacterial soaps.  If your skin becomes reddened/irritated stop using the CHG and inform your nurse when you arrive at Short Stay. Do not shave (including legs and underarms) for  at least 48 hours prior to the first CHG shower.  You may shave your face. Please follow these instructions carefully:   1.  Shower with CHG Soap the night before surgery and the  morning of Surgery.   2.  If you choose to wash your hair, wash your hair first as usual with your  normal  Shampoo.   3.  After you shampoo, rinse your hair and body thoroughly to remove the  shampoo.                                         4.  Use CHG as you would any other liquid soap.  You can apply chg directly  to the skin and wash . Gently wash with scrungie or clean wascloth    5.  Apply the CHG Soap to your body ONLY FROM THE NECK DOWN.   Do not use on open                           Wound or open sores. Avoid contact with eyes, ears mouth and genitals (private parts).  Genitals (private parts) with your normal soap.              6.  Wash thoroughly, paying special attention to the area where your surgery  will be performed.   7.  Thoroughly rinse your body with warm water from the neck down.   8.  DO NOT shower/wash with your normal soap after using and rinsing off  the CHG Soap .                9.  Pat yourself dry with a clean towel.             10.  Wear clean night clothes to bed after shower             11.  Place clean sheets on your bed the night of your first shower and do not  sleep with pets.  Day of Surgery : Do not apply any lotions/deodorants the morning of surgery.  Please wear clean clothes to the hospital/surgery center.  FAILURE TO FOLLOW THESE INSTRUCTIONS MAY RESULT IN THE CANCELLATION OF YOUR SURGERY    PATIENT SIGNATURE_________________________________  ______________________________________________________________________

## 2015-02-15 ENCOUNTER — Encounter (HOSPITAL_COMMUNITY)
Admission: RE | Admit: 2015-02-15 | Discharge: 2015-02-15 | Disposition: A | Discharge status: Home / Self Care | Payer: BLUE CROSS/BLUE SHIELD | Source: Ambulatory Visit | Attending: General Surgery | Admitting: General Surgery

## 2015-02-15 ENCOUNTER — Encounter (HOSPITAL_COMMUNITY): Payer: Self-pay

## 2015-02-15 DIAGNOSIS — Z01812 Encounter for preprocedural laboratory examination: Secondary | ICD-10-CM | POA: Diagnosis not present

## 2015-02-15 DIAGNOSIS — Z01810 Encounter for preprocedural cardiovascular examination: Secondary | ICD-10-CM | POA: Diagnosis not present

## 2015-02-15 DIAGNOSIS — K439 Ventral hernia without obstruction or gangrene: Secondary | ICD-10-CM | POA: Diagnosis not present

## 2015-02-15 DIAGNOSIS — I10 Essential (primary) hypertension: Secondary | ICD-10-CM | POA: Insufficient documentation

## 2015-02-15 HISTORY — DX: Incisional hernia without obstruction or gangrene: K43.2

## 2015-02-15 LAB — BASIC METABOLIC PANEL
Anion gap: 8 (ref 5–15)
BUN: 18 mg/dL (ref 6–20)
CO2: 27 mmol/L (ref 22–32)
Calcium: 8.8 mg/dL — ABNORMAL LOW (ref 8.9–10.3)
Chloride: 104 mmol/L (ref 101–111)
Creatinine, Ser: 0.8 mg/dL (ref 0.61–1.24)
GFR calc Af Amer: >60 mL/min (ref >60)
GFR calc non Af Amer: >60 mL/min (ref >60)
Glucose, Bld: 86 mg/dL (ref 65–99)
Potassium: 4.2 mmol/L (ref 3.5–5.1)
Sodium: 139 mmol/L (ref 135–145)

## 2015-02-15 LAB — CBC
HCT: 47.2 % (ref 39.0–52.0)
Hemoglobin: 15.4 g/dL (ref 13.0–17.0)
MCH: 29.2 pg (ref 26.0–34.0)
MCHC: 32.6 g/dL (ref 30.0–36.0)
MCV: 89.6 fL (ref 78.0–100.0)
Platelets: 234 10*3/uL (ref 150–400)
RBC: 5.27 MIL/uL (ref 4.22–5.81)
RDW: 13.3 % (ref 11.5–15.5)
WBC: 9.6 10*3/uL (ref 4.0–10.5)

## 2015-02-15 NOTE — Progress Notes (Signed)
   02/15/15 5364  OBSTRUCTIVE SLEEP APNEA  Have you ever been diagnosed with sleep apnea through a sleep study? No  Do you snore loudly (loud enough to be heard through closed doors)?  1  Do you often feel tired, fatigued, or sleepy during the daytime? 0  Has anyone observed you stop breathing during your sleep? 1  Do you have, or are you being treated for high blood pressure? 1  BMI more than 35 kg/m2? 1  Age over 50 years old? 0  Neck circumference greater than 40 cm/16 inches? 1  Gender: 1

## 2015-02-15 NOTE — Progress Notes (Signed)
EKG reviewed by Dr.Ewell - no action needed 

## 2015-02-19 MED ORDER — VANCOMYCIN HCL 10 G IV SOLR
1500.0000 mg | INTRAVENOUS | Status: AC
  Administered 2015-02-20: 1500 mg via INTRAVENOUS
  Filled 2015-02-19 (×2): qty 1500

## 2015-02-19 NOTE — Anesthesia Preprocedure Evaluation (Addendum)
Anesthesia Evaluation  Patient identified by MRN, date of birth, ID band Patient awake    Reviewed: Allergy & Precautions, NPO status , Patient's Chart, lab work & pertinent test results  History of Anesthesia Complications Negative for: history of anesthetic complications  Airway Mallampati: III  TM Distance: >3 FB Neck ROM: Full    Dental no notable dental hx. (+) Dental Advisory Given   Pulmonary former smoker,  breath sounds clear to auscultation  Pulmonary exam normal       Cardiovascular hypertension, Pt. on medications Normal cardiovascular examRhythm:Regular Rate:Normal     Neuro/Psych PSYCHIATRIC DISORDERS Anxiety negative neurological ROS     GI/Hepatic negative GI ROS, Neg liver ROS,   Endo/Other  Morbid obesity  Renal/GU negative Renal ROS  negative genitourinary   Musculoskeletal negative musculoskeletal ROS (+)   Abdominal   Peds negative pediatric ROS (+)  Hematology negative hematology ROS (+)   Anesthesia Other Findings   Reproductive/Obstetrics negative OB ROS                            Anesthesia Physical Anesthesia Plan  ASA: III  Anesthesia Plan: General   Post-op Pain Management:    Induction: Intravenous  Airway Management Planned: Oral ETT  Additional Equipment:   Intra-op Plan:   Post-operative Plan: Extubation in OR  Informed Consent: I have reviewed the patients History and Physical, chart, labs and discussed the procedure including the risks, benefits and alternatives for the proposed anesthesia with the patient or authorized representative who has indicated his/her understanding and acceptance.   Dental advisory given  Plan Discussed with: CRNA  Anesthesia Plan Comments:         Anesthesia Quick Evaluation

## 2015-02-19 NOTE — H&P (Signed)
History of Present Illness Chad Lyons; 12/13/2014 2:55 PM) Patient words: hernia.  The patient is a 49 year old male who presents with an incisional hernia. He returns to the office to discuss ventral incisional hernia repair. He is status post lap band in 2009 with over 100 pound weight loss maintained. He has a long-standing known ventral incisional hernia with history of sigmoid colectomy for diverticulitis with perforation and abscess with a small bowel fistula to his postoperative midline wound that healed but he was left with a periumbilical incisional hernia. He had previously post pone the hernia repair due to commitments at work but has noted increasing pain and swelling and bloating and is ready to get it fixed.   Other Problems Chad Lyons; 12/13/2014 1:39 PM) Diverticulosis Gastroesophageal Reflux Disease Heart murmur High blood pressure Inguinal Hernia  Past Surgical History Chad Lyons; 12/13/2014 1:39 PM) Colon Polyp Removal - Colonoscopy Colon Removal - Partial Lap Band Oral Surgery Resection of Small Bowel Resection of Stomach  Diagnostic Studies History Chad Lyons; 12/13/2014 1:39 PM) Colonoscopy 5-10 years ago  Allergies Chad Lyons; 12/13/2014 1:40 PM) Hydromet *COUGH/COLD/ALLERGY* Iohexol *DIAGNOSTIC PRODUCTS* Levofloxacin *FLUOROQUINOLONES* Penicillin G Procaine *PENICILLINS*  Medication History Chad Lyons; 12/13/2014 1:42 PM) Lisinopril-Hydrochlorothiazide (20-25MG  Tablet, Oral) Active. Sertraline HCl (50MG  Tablet, Oral) Active. Triamcinolone Acetonide (0.1% Cream, External) Active. Verapamil HCl ER (240MG  Tablet ER, Oral) Active. Verapamil HCl ER (240MG  Capsule ER 24HR, Oral) Active. Zithromax (250MG  Tablet, Oral) Active. Triamcinolone Acetonide (0.1% Lotion, External) Active. Mucinex D (60-600MG  Tablet ER 12HR, Oral) Active. Antivert (25MG  Tablet, Oral) Active. Ibuprofen (800MG   Tablet, Oral) Active. Xanax (0.5MG  Tablet, Oral) Active. Medications Reconciled  Social History Chad Lyons, [place]; 12/13/2014 1:39 PM) Alcohol use Moderate alcohol use. No caffeine use No drug use Tobacco use Current some day smoker.  Family History Chad Lyons, [place]; 12/13/2014 1:39 PM) Arthritis Family Members In General. Heart Disease Mother. Heart disease in male family member before age 58 Hypertension Father, Mother.  Review of Systems Chad Lyons Lyons; 12/13/2014 1:39 PM) General Not Present- Appetite Loss, Chills, Fatigue, Fever, Night Sweats, Weight Gain and Weight Loss. Skin Not Present- Change in Wart/Mole, Dryness, Hives, Jaundice, New Lesions, Non-Healing Wounds, Rash and Ulcer. HEENT Present- Seasonal Allergies. Not Present- Earache, Hearing Loss, Hoarseness, Nose Bleed, Oral Ulcers, Ringing in the Ears, Sinus Pain, Sore Throat, Visual Disturbances, Wears glasses/contact lenses and Yellow Eyes. Respiratory Not Present- Bloody sputum, Chronic Cough, Difficulty Breathing, Snoring and Wheezing. Breast Not Present- Breast Mass, Breast Pain, Nipple Discharge and Skin Changes. Cardiovascular Not Present- Chest Pain, Difficulty Breathing Lying Down, Leg Cramps, Palpitations, Rapid Heart Rate, Shortness of Breath and Swelling of Extremities. Gastrointestinal Present- Abdominal Pain and Bloating. Not Present- Bloody Stool, Change in Bowel Habits, Chronic diarrhea, Constipation, Difficulty Swallowing, Excessive gas, Gets full quickly at meals, Hemorrhoids, Indigestion, Nausea, Rectal Pain and Vomiting. Male Genitourinary Not Present- Blood in Urine, Change in Urinary Stream, Frequency, Impotence, Nocturia, Painful Urination, Urgency and Urine Leakage. Musculoskeletal Not Present- Back Pain, Joint Pain, Joint Stiffness, Muscle Pain, Muscle Weakness and Swelling of Extremities. Neurological Not Present- Decreased Memory, Fainting, Headaches, Numbness, Seizures, Tingling, Tremor,  Trouble walking and Weakness. Psychiatric Not Present- Anxiety, Bipolar, Change in Sleep Pattern, Depression, Fearful and Frequent crying. Endocrine Not Present- Cold Intolerance, Excessive Hunger, Hair Changes, Heat Intolerance, Hot flashes and New Diabetes. Hematology Not Present- Easy Bruising, Excessive bleeding, Gland problems, HIV and Persistent Infections.   Vitals Chad Lyons Lyons; 12/13/2014 1:42 PM) 12/13/2014 1:42  PM Weight: 314 lb Height: 73in Body Surface Area: 2.71 m Body Mass Index: 41.43 kg/m Temp.: 99.82F(Oral)  Pulse: 63 (Regular)  Resp.: 18 (Unlabored)  BP: 134/80 (Sitting, Left Arm, Standard)    Physical Exam Chad Lyons T. Chad Lyons; 12/13/2014 2:56 PM) The physical exam findings are as follows: Note:General: Alert, well-developed and moderately obese African-American male, in no distress Skin: Warm and dry without rash or infection. HEENT: No palpable masses or thyromegaly. Sclera nonicteric. Pupils equal round and reactive. Lymph nodes: No cervical, supraclavicular, or inguinal nodes palpable. Lungs: Breath sounds clear and equal. No wheezing or increased work of breathing. Cardiovascular: Regular rate and rhythm without murmer. No JVD or edema. Abdomen: Nondistended. Soft and nontender. Port site looks fine. No masses palpable. No organomegaly. There is a moderate sized midline ventral incisional hernia with a discrete several centimeter defect at the umbilicus and some extension superiorly along the midline for several centimeters. Extremities: No edema or joint swelling or deformity. No chronic venous stasis changes. Neurologic: Alert and fully oriented. Gait normal. No focal weakness. Psychiatric: Normal mood and affect. Thought content appropriate with normal judgement and insight    Assessment & Plan Chad Lyons; 12/13/2014 3:01 PM) VENTRAL INCISIONAL HERNIA (553.21  K43.2) Impression: This is significantly symptomatic and needs  to be repaired. I discussed options for repair including laparoscopic and open repair. I believe he would be a good candidate for a retrorectus open repair. He is in agreement. We discussed the nature of the surgery and recovery as well as risks of anesthetic complications, bleeding, infection and recurrence. Current Plans  Schedule for Surgery Open repair of ventral incisional hernia with mesh GASTRIC BANDING STATUS (V45.86  Z98.84) Impression: He has some symptoms of over restriction but can almost always relate this to trying to eat too fast or talking or other activities while eating and at this point does not want any adjustment to his band. Overall excellent weight loss.

## 2015-02-20 ENCOUNTER — Inpatient Hospital Stay (HOSPITAL_COMMUNITY)
Admission: RE | Admit: 2015-02-20 | Discharge: 2015-02-24 | DRG: 355 | Disposition: A | Discharge status: Home / Self Care | Payer: BLUE CROSS/BLUE SHIELD | Source: Ambulatory Visit | Attending: General Surgery | Admitting: General Surgery

## 2015-02-20 ENCOUNTER — Encounter (HOSPITAL_COMMUNITY): Payer: Self-pay | Admitting: *Deleted

## 2015-02-20 ENCOUNTER — Inpatient Hospital Stay (HOSPITAL_COMMUNITY): Payer: BLUE CROSS/BLUE SHIELD | Admitting: Certified Registered"

## 2015-02-20 ENCOUNTER — Encounter (HOSPITAL_COMMUNITY)
Admission: RE | Disposition: A | Discharge status: Home / Self Care | Payer: Self-pay | Source: Ambulatory Visit | Attending: General Surgery

## 2015-02-20 DIAGNOSIS — K432 Incisional hernia without obstruction or gangrene: Principal | ICD-10-CM | POA: Diagnosis present

## 2015-02-20 DIAGNOSIS — Z8249 Family history of ischemic heart disease and other diseases of the circulatory system: Secondary | ICD-10-CM

## 2015-02-20 DIAGNOSIS — Z9049 Acquired absence of other specified parts of digestive tract: Secondary | ICD-10-CM | POA: Diagnosis present

## 2015-02-20 DIAGNOSIS — F17210 Nicotine dependence, cigarettes, uncomplicated: Secondary | ICD-10-CM | POA: Diagnosis present

## 2015-02-20 DIAGNOSIS — K219 Gastro-esophageal reflux disease without esophagitis: Secondary | ICD-10-CM | POA: Diagnosis present

## 2015-02-20 DIAGNOSIS — Z9884 Bariatric surgery status: Secondary | ICD-10-CM | POA: Diagnosis not present

## 2015-02-20 HISTORY — PX: VENTRAL HERNIA REPAIR: SHX424

## 2015-02-20 HISTORY — PX: INSERTION OF MESH: SHX5868

## 2015-02-20 SURGERY — REPAIR, HERNIA, VENTRAL
Anesthesia: General | Site: Abdomen

## 2015-02-20 MED ORDER — MIDAZOLAM HCL 2 MG/2ML IJ SOLN
INTRAMUSCULAR | Status: AC
  Filled 2015-02-20: qty 2

## 2015-02-20 MED ORDER — LABETALOL HCL 5 MG/ML IV SOLN
INTRAVENOUS | Status: DC | PRN
  Administered 2015-02-20 (×2): 5 mg via INTRAVENOUS

## 2015-02-20 MED ORDER — HYDROMORPHONE HCL 1 MG/ML IJ SOLN
0.2500 mg | INTRAMUSCULAR | Status: DC | PRN
  Administered 2015-02-20 (×4): 0.5 mg via INTRAVENOUS

## 2015-02-20 MED ORDER — DEXAMETHASONE SODIUM PHOSPHATE 10 MG/ML IJ SOLN
INTRAMUSCULAR | Status: DC | PRN
  Administered 2015-02-20: 10 mg via INTRAVENOUS

## 2015-02-20 MED ORDER — DIPHENHYDRAMINE HCL 50 MG/ML IJ SOLN
25.0000 mg | Freq: Four times a day (QID) | INTRAMUSCULAR | Status: DC | PRN
  Administered 2015-02-20 – 2015-02-21 (×4): 25 mg via INTRAVENOUS
  Filled 2015-02-20 (×4): qty 1

## 2015-02-20 MED ORDER — LACTATED RINGERS IV SOLN
INTRAVENOUS | Status: DC | PRN
  Administered 2015-02-20 (×2): via INTRAVENOUS

## 2015-02-20 MED ORDER — PROPOFOL 10 MG/ML IV BOLUS
INTRAVENOUS | Status: AC
  Filled 2015-02-20: qty 20

## 2015-02-20 MED ORDER — HYDROMORPHONE HCL 1 MG/ML IJ SOLN
INTRAMUSCULAR | Status: AC
  Filled 2015-02-20: qty 1

## 2015-02-20 MED ORDER — ONDANSETRON HCL 4 MG/2ML IJ SOLN: 4.0000 mg | Freq: Four times a day (QID) | INTRAMUSCULAR | Status: DC | PRN

## 2015-02-20 MED ORDER — DIPHENHYDRAMINE HCL 12.5 MG/5ML PO ELIX: 12.5000 mg | ORAL_SOLUTION | Freq: Four times a day (QID) | ORAL | Status: DC | PRN

## 2015-02-20 MED ORDER — NEOSTIGMINE METHYLSULFATE 10 MG/10ML IV SOLN
INTRAVENOUS | Status: DC | PRN
  Administered 2015-02-20: 4 mg via INTRAVENOUS

## 2015-02-20 MED ORDER — CHLORHEXIDINE GLUCONATE 4 % EX LIQD: 1.0000 "application " | Freq: Once | CUTANEOUS | Status: DC

## 2015-02-20 MED ORDER — HYDROMORPHONE 0.3 MG/ML IV SOLN
INTRAVENOUS | Status: AC
  Filled 2015-02-20: qty 25

## 2015-02-20 MED ORDER — SERTRALINE HCL 50 MG PO TABS
50.0000 mg | ORAL_TABLET | Freq: Every day | ORAL | Status: DC
  Administered 2015-02-20 – 2015-02-24 (×5): 50 mg via ORAL
  Filled 2015-02-20 (×5): qty 1

## 2015-02-20 MED ORDER — ENOXAPARIN SODIUM 40 MG/0.4ML ~~LOC~~ SOLN
40.0000 mg | SUBCUTANEOUS | Status: DC
  Administered 2015-02-21 – 2015-02-24 (×4): 40 mg via SUBCUTANEOUS
  Filled 2015-02-20 (×6): qty 0.4

## 2015-02-20 MED ORDER — LISINOPRIL-HYDROCHLOROTHIAZIDE 20-25 MG PO TABS: 1.0000 | ORAL_TABLET | Freq: Every day | ORAL | Status: DC

## 2015-02-20 MED ORDER — SUCCINYLCHOLINE CHLORIDE 20 MG/ML IJ SOLN
INTRAMUSCULAR | Status: DC | PRN
  Administered 2015-02-20: 140 mg via INTRAVENOUS

## 2015-02-20 MED ORDER — NEOSTIGMINE METHYLSULFATE 10 MG/10ML IV SOLN
INTRAVENOUS | Status: AC
  Filled 2015-02-20: qty 1

## 2015-02-20 MED ORDER — HYDROCHLOROTHIAZIDE 25 MG PO TABS
25.0000 mg | ORAL_TABLET | Freq: Every day | ORAL | Status: DC
  Administered 2015-02-20 – 2015-02-24 (×5): 25 mg via ORAL
  Filled 2015-02-20 (×5): qty 1

## 2015-02-20 MED ORDER — HEPARIN SODIUM (PORCINE) 5000 UNIT/ML IJ SOLN
5000.0000 [IU] | Freq: Once | INTRAMUSCULAR | Status: AC
  Administered 2015-02-20: 5000 [IU] via SUBCUTANEOUS
  Filled 2015-02-20: qty 1

## 2015-02-20 MED ORDER — VERAPAMIL HCL ER 240 MG PO TBCR
240.0000 mg | EXTENDED_RELEASE_TABLET | Freq: Every day | ORAL | Status: DC
  Administered 2015-02-20 – 2015-02-23 (×4): 240 mg via ORAL
  Filled 2015-02-20 (×5): qty 1

## 2015-02-20 MED ORDER — GLYCOPYRROLATE 0.2 MG/ML IJ SOLN
INTRAMUSCULAR | Status: AC
  Filled 2015-02-20: qty 3

## 2015-02-20 MED ORDER — GLYCOPYRROLATE 0.2 MG/ML IJ SOLN
INTRAMUSCULAR | Status: DC | PRN
  Administered 2015-02-20: 0.2 mg via INTRAVENOUS
  Administered 2015-02-20: 0.6 mg via INTRAVENOUS

## 2015-02-20 MED ORDER — DIPHENHYDRAMINE HCL 50 MG/ML IJ SOLN
INTRAMUSCULAR | Status: AC
  Filled 2015-02-20: qty 1

## 2015-02-20 MED ORDER — LISINOPRIL 20 MG PO TABS
20.0000 mg | ORAL_TABLET | Freq: Every day | ORAL | Status: DC
  Administered 2015-02-20 – 2015-02-24 (×5): 20 mg via ORAL
  Filled 2015-02-20 (×5): qty 1

## 2015-02-20 MED ORDER — ONDANSETRON HCL 4 MG PO TABS: 4.0000 mg | ORAL_TABLET | Freq: Four times a day (QID) | ORAL | Status: DC | PRN

## 2015-02-20 MED ORDER — LACTATED RINGERS IV SOLN: INTRAVENOUS | Status: DC

## 2015-02-20 MED ORDER — PSEUDOEPHEDRINE HCL 60 MG PO TABS
60.0000 mg | ORAL_TABLET | Freq: Two times a day (BID) | ORAL | Status: DC
  Administered 2015-02-21 – 2015-02-24 (×6): 60 mg via ORAL
  Filled 2015-02-20 (×10): qty 1

## 2015-02-20 MED ORDER — ONDANSETRON HCL 4 MG/2ML IJ SOLN: 4.0000 mg | Freq: Once | INTRAMUSCULAR | Status: DC | PRN

## 2015-02-20 MED ORDER — POTASSIUM CHLORIDE IN NACL 20-0.9 MEQ/L-% IV SOLN
INTRAVENOUS | Status: DC
  Administered 2015-02-20 – 2015-02-21 (×4): via INTRAVENOUS
  Administered 2015-02-22: 100 mL via INTRAVENOUS
  Administered 2015-02-23 (×2): 50 mL/h via INTRAVENOUS
  Filled 2015-02-20 (×8): qty 1000

## 2015-02-20 MED ORDER — GUAIFENESIN ER 600 MG PO TB12
600.0000 mg | ORAL_TABLET | Freq: Two times a day (BID) | ORAL | Status: DC
  Administered 2015-02-21 – 2015-02-24 (×6): 600 mg via ORAL
  Filled 2015-02-20 (×10): qty 1

## 2015-02-20 MED ORDER — HYDROMORPHONE HCL 2 MG/ML IJ SOLN
INTRAMUSCULAR | Status: AC
  Filled 2015-02-20: qty 1

## 2015-02-20 MED ORDER — PROPOFOL 10 MG/ML IV BOLUS
INTRAVENOUS | Status: DC | PRN
  Administered 2015-02-20: 200 mg via INTRAVENOUS

## 2015-02-20 MED ORDER — SODIUM CHLORIDE 0.9 % IJ SOLN: 9.0000 mL | INTRAMUSCULAR | Status: DC | PRN

## 2015-02-20 MED ORDER — MIDAZOLAM HCL 5 MG/5ML IJ SOLN
INTRAMUSCULAR | Status: DC | PRN
  Administered 2015-02-20: 2 mg via INTRAVENOUS

## 2015-02-20 MED ORDER — HYDROMORPHONE HCL 1 MG/ML IJ SOLN
INTRAMUSCULAR | Status: DC | PRN
  Administered 2015-02-20 (×2): 1 mg via INTRAVENOUS

## 2015-02-20 MED ORDER — DIPHENHYDRAMINE HCL 50 MG/ML IJ SOLN
12.5000 mg | Freq: Four times a day (QID) | INTRAMUSCULAR | Status: DC | PRN
  Administered 2015-02-20: 12.5 mg via INTRAVENOUS
  Filled 2015-02-20: qty 1

## 2015-02-20 MED ORDER — SODIUM CHLORIDE 0.9 % IR SOLN
Status: AC
  Filled 2015-02-20: qty 1

## 2015-02-20 MED ORDER — HYDROMORPHONE 0.3 MG/ML IV SOLN
INTRAVENOUS | Status: DC
  Administered 2015-02-20: 0.6 mg via INTRAVENOUS
  Administered 2015-02-20: 11:00:00 via INTRAVENOUS
  Administered 2015-02-20: 0.3 mg via INTRAVENOUS
  Administered 2015-02-21 (×2): 0.9 mg via INTRAVENOUS
  Administered 2015-02-21: 0 mg via INTRAVENOUS
  Administered 2015-02-21 (×2): 0.9 mg via INTRAVENOUS

## 2015-02-20 MED ORDER — PSEUDOEPHEDRINE-GUAIFENESIN ER 60-600 MG PO TB12: 1.0000 | ORAL_TABLET | Freq: Two times a day (BID) | ORAL | Status: DC

## 2015-02-20 MED ORDER — NALOXONE HCL 0.4 MG/ML IJ SOLN: 0.4000 mg | INTRAMUSCULAR | Status: DC | PRN

## 2015-02-20 MED ORDER — LIDOCAINE HCL (CARDIAC) 20 MG/ML IV SOLN
INTRAVENOUS | Status: AC
  Filled 2015-02-20: qty 5

## 2015-02-20 MED ORDER — FENTANYL CITRATE (PF) 100 MCG/2ML IJ SOLN
INTRAMUSCULAR | Status: DC | PRN
  Administered 2015-02-20: 100 ug via INTRAVENOUS

## 2015-02-20 MED ORDER — FENTANYL CITRATE (PF) 100 MCG/2ML IJ SOLN
25.0000 ug | INTRAMUSCULAR | Status: DC | PRN
Start: 2015-02-20 — End: 2015-02-20

## 2015-02-20 MED ORDER — ALPRAZOLAM 0.5 MG PO TABS
0.5000 mg | ORAL_TABLET | Freq: Two times a day (BID) | ORAL | Status: DC
  Administered 2015-02-20 – 2015-02-24 (×8): 0.5 mg via ORAL
  Filled 2015-02-20 (×9): qty 1

## 2015-02-20 MED ORDER — FENTANYL CITRATE (PF) 250 MCG/5ML IJ SOLN
INTRAMUSCULAR | Status: AC
  Filled 2015-02-20: qty 5

## 2015-02-20 MED ORDER — LIDOCAINE HCL (PF) 2 % IJ SOLN
INTRAMUSCULAR | Status: DC | PRN
  Administered 2015-02-20: 40 mg via INTRADERMAL

## 2015-02-20 MED ORDER — DIPHENHYDRAMINE HCL 50 MG/ML IJ SOLN
12.5000 mg | Freq: Once | INTRAMUSCULAR | Status: AC
  Administered 2015-02-20: 12.5 mg via INTRAVENOUS

## 2015-02-20 MED ORDER — ROCURONIUM BROMIDE 100 MG/10ML IV SOLN
INTRAVENOUS | Status: DC | PRN
  Administered 2015-02-20: 10 mg via INTRAVENOUS
  Administered 2015-02-20: 40 mg via INTRAVENOUS
  Administered 2015-02-20: 10 mg via INTRAVENOUS

## 2015-02-20 MED ORDER — ONDANSETRON HCL 4 MG/2ML IJ SOLN
INTRAMUSCULAR | Status: DC | PRN
  Administered 2015-02-20: 4 mg via INTRAVENOUS

## 2015-02-20 SURGICAL SUPPLY — 35 items
BINDER ABDOMINAL 12 ML 46-62 (SOFTGOODS) ×2 IMPLANT
BLADE EXTENDED COATED 6.5IN (ELECTRODE) ×2 IMPLANT
BLADE HEX COATED 2.75 (ELECTRODE) ×2 IMPLANT
DECANTER SPIKE VIAL GLASS SM (MISCELLANEOUS) IMPLANT
DEVICE TROCAR PUNCTURE CLOSURE (ENDOMECHANICALS) ×2 IMPLANT
DRAPE INCISE IOBAN 66X45 STRL (DRAPES) ×2 IMPLANT
DRAPE LAPAROTOMY T 102X78X121 (DRAPES) ×2 IMPLANT
ELECT REM PT RETURN 9FT ADLT (ELECTROSURGICAL) ×2
ELECTRODE REM PT RTRN 9FT ADLT (ELECTROSURGICAL) ×1 IMPLANT
GAUZE SPONGE 4X4 12PLY STRL (GAUZE/BANDAGES/DRESSINGS) ×2 IMPLANT
GLOVE BIOGEL PI IND STRL 7.0 (GLOVE) ×2 IMPLANT
GLOVE BIOGEL PI INDICATOR 7.0 (GLOVE) ×2
GOWN STRL REUS W/TWL LRG LVL3 (GOWN DISPOSABLE) ×2 IMPLANT
GOWN STRL REUS W/TWL XL LVL3 (GOWN DISPOSABLE) ×4 IMPLANT
KIT BASIN OR (CUSTOM PROCEDURE TRAY) ×2 IMPLANT
MESH SOFT 12X12IN BARD (Mesh General) ×2 IMPLANT
NEEDLE HYPO 25X1 1.5 SAFETY (NEEDLE) IMPLANT
NS IRRIG 1000ML POUR BTL (IV SOLUTION) ×2 IMPLANT
PACK GENERAL/GYN (CUSTOM PROCEDURE TRAY) ×2 IMPLANT
SPONGE LAP 18X18 X RAY DECT (DISPOSABLE) ×2 IMPLANT
SPONGE LAP 4X18 X RAY DECT (DISPOSABLE) IMPLANT
STAPLER VISISTAT 35W (STAPLE) ×2 IMPLANT
SUT PDS AB 1 CTX 36 (SUTURE) IMPLANT
SUT PROLENE 0 CT 1 30 (SUTURE) ×6 IMPLANT
SUT PROLENE 0 CT 1 CR/8 (SUTURE) ×2 IMPLANT
SUT SILK 2 0 (SUTURE)
SUT SILK 2 0 SH CR/8 (SUTURE) IMPLANT
SUT SILK 2-0 18XBRD TIE 12 (SUTURE) IMPLANT
SUT SILK 3 0 (SUTURE)
SUT SILK 3-0 18XBRD TIE 12 (SUTURE) IMPLANT
SUT VIC AB 3-0 SH 27 (SUTURE)
SUT VIC AB 3-0 SH 27XBRD (SUTURE) IMPLANT
SYR CONTROL 10ML LL (SYRINGE) IMPLANT
TOWEL OR 17X26 10 PK STRL BLUE (TOWEL DISPOSABLE) ×2 IMPLANT
TRAY FOLEY W/METER SILVER 14FR (SET/KITS/TRAYS/PACK) ×2 IMPLANT

## 2015-02-20 NOTE — Op Note (Signed)
Preoperative Diagnosis: ventral hernia  Postoprative Diagnosis: ventral hernia  Procedure: Procedure(s): OPEN REPAIR VENTRAL INCISIONAL HERNIA WITH COMPONENT RELEASE/ABDOMINAL WALL RECONSTRUCTION RETRORECTUS TECHNIQUE AND INSERTION OF MESH   Surgeon: Excell Seltzer T   Assistants: Jackolyn Confer  Anesthesia:  General endotracheal anesthesia  Indications: Patient has a previous history of colectomy for diverticulitis. Also lap band placement with approximately 100 pound weight loss. He has a gradually enlarging and increasingly symptomatic ventral incisional hernia presenting at the mid abdomen at and above the umbilicus. After previous extensive discussion regarding indications and risks detailed elsewhere with electric proceed with open repair of this hernia with mesh using component release.    Procedure Detail:  Patient was brought to the operating room, placed in the superficial knee operating table, and general endotracheal anesthesia induced. He received preoperative IV antibiotic. PAS were in place. The abdomen was widely sterilely prepped and draped using Ioban drape. Patient timeout was performed and correct procedure verified. I initially made an incision several centimeters below the umbilicus excising the previous scar and extending this up for 5 or 6 cm above the umbilicus. Dissection was carried down through the simultaneous tissue. Hernia sacs were encountered and fascia divided along the midline and peritoneum opened. Omental adhesions were taken down from the peritoneum with careful sharp and cautery dissection. As I took down adhesions I could feel 2 more Swiss cheese type defects at least a centimeter in diameter more superiorly along the incision and the incision was extended superiorly in order to encompass these areas in the repair. The entire fascia was opened along the midline down to the defects. Hernia sacs were excised. Intraperitoneal adhesions were taken down off  the abdominal wall out laterally. Then beginning on the left side elevating the midline fascia the peritoneum was incised and posterior rectus sheath incised a centimeter or 2 from the midline. The rectus muscle was identified. Using a finger in the rectus sheath for guidance incising as far towards the midline as possible the posterior rectus sheath was incised along the length of the incision and the retrorectus space developed with blunt dissection out laterally to the epigastric vessels which were carefully protected along with the perforators. Following this an identical dissection was performed on the right side.. Then exposing the inferior and superior ends of the incision the peritoneum was taken down superiorly and inferiorly for at least 5 cm in either direction. At this point we had developed a very large space behind the rectus muscle for mesh placement and the rectus sheath and midline fascia would come to the midline without undue tension. A piece of Bard soft polypropylene mesh was chosen. We measured the area to be covered and created a 22 x 15 oval piece of mesh. This was then placed on top of the rectus sheath behind the rectus muscle deployed out well laterally and was fixed in place with 6 circumferential 0 Novafil transfixion sutures brought out through the anterior abdominal wall with the Endo Close device tautly deploying the mesh out in all directions in the retrorectus space with broad coverage. The space was irrigated with antibiotic solution and complete hemostasis assured. A 19 Blake closed suction drain was brought out through a separate stab wound and left in the retrorectus space. Following this the midline fascia after minimal mobilization of the edge was closed with running #1 Novafil begun at either end of the fascia and tied centrally. The simultaneous tissue is irrigated. A 19 Blake drain was left in the subcutaneous space.  Skin was closed with staples. Total operative time was 2  hours. Sponge needle and instrument counts were correct.    Findings: As above  Estimated Blood Loss:  Minimal         Drains: 19 Blake drain in the retrorectus space and subcutaneous space. Retrorectus drain on patient's left and subcutaneous drain on patient's right  Blood Given: none          Specimens: None        Complications:  * No complications entered in OR log *         Disposition: PACU - hemodynamically stable.         Condition: stable

## 2015-02-20 NOTE — Transfer of Care (Signed)
Immediate Anesthesia Transfer of Care Note  Patient: Chad Lyons  Procedure(s) Performed: Procedure(s): OPEN REPAIR VENTRAL INCISIONAL HERNIA (N/A) INSERTION OF MESH (N/A)  Patient Location: PACU  Anesthesia Type:General  Level of Consciousness:  sedated, patient cooperative and responds to stimulation  Airway & Oxygen Therapy:Patient Spontanous Breathing and Patient connected to face mask oxgen  Post-op Assessment:  Report given to PACU RN and Post -op Vital signs reviewed and stable  Post vital signs:  Reviewed and stable  Last Vitals:  Filed Vitals:   02/20/15 0544  BP: 160/97  Pulse: 64  Temp: 36.9 C  Resp: 18    Complications: No apparent anesthesia complications

## 2015-02-20 NOTE — Anesthesia Postprocedure Evaluation (Signed)
  Anesthesia Post-op Note  Patient: Chad Lyons  Procedure(s) Performed: Procedure(s) (LRB): OPEN REPAIR VENTRAL INCISIONAL HERNIA (N/A) INSERTION OF MESH (N/A)  Patient Location: PACU  Anesthesia Type: General  Level of Consciousness: awake and alert   Airway and Oxygen Therapy: Patient Spontanous Breathing  Post-op Pain: mild  Post-op Assessment: Post-op Vital signs reviewed, Patient's Cardiovascular Status Stable, Respiratory Function Stable, Patent Airway and No signs of Nausea or vomiting  Last Vitals:  Filed Vitals:   02/20/15 1028  BP: 165/97  Pulse: 80  Temp: 36.7 C  Resp: 15    Post-op Vital Signs: stable   Complications: No apparent anesthesia complications

## 2015-02-20 NOTE — Interval H&P Note (Signed)
History and Physical Interval Note:  02/20/2015 7:19 AM  Chad Lyons  has presented today for surgery, with the diagnosis of ventral hernia  The various methods of treatment have been discussed with the patient and family. After consideration of risks, benefits and other options for treatment, the patient has consented to  Procedure(s): OPEN REPAIR VENTRAL INCISIONAL HERNIA (N/A) INSERTION OF MESH (N/A) as a surgical intervention .  The patient's history has been reviewed, patient examined, no change in status, stable for surgery.  I have reviewed the patient's chart and labs.  Questions were answered to the patient's satisfaction.     Aliscia Clayton T

## 2015-02-20 NOTE — Progress Notes (Signed)
Benadryl increased to 25 mg every 6 hours due to 12.5 mg being ineffective, order entered per pervious electronic order Neta Mends RN 6:49 PM [phone]

## 2015-02-20 NOTE — Progress Notes (Signed)
Utilization review completed.  L J Krisanne Lich RN, BSN, CM  [phone] 

## 2015-02-20 NOTE — Anesthesia Procedure Notes (Signed)
Procedure Name: Intubation Date/Time: 02/20/2015 7:32 AM Performed by: Lajuana Carry E Pre-anesthesia Checklist: Patient identified, Emergency Drugs available, Suction available and Patient being monitored Patient Re-evaluated:Patient Re-evaluated prior to inductionOxygen Delivery Method: Circle System Utilized Preoxygenation: Pre-oxygenation with 100% oxygen Intubation Type: IV induction Ventilation: Mask ventilation without difficulty Laryngoscope Size: Miller, 3 and Glidescope (Attempt w/ Miller 3 w/ limited view, change to Glidescope) Grade View: Grade II Tube type: Oral Tube size: 7.5 mm Number of attempts: 1 Airway Equipment and Method: Stylet and Video-laryngoscopy (pillows under shoulders) Placement Confirmation: ETT inserted through vocal cords under direct vision,  positive ETCO2 and breath sounds checked- equal and bilateral Secured at: 24 cm Tube secured with: Tape Dental Injury: Teeth and Oropharynx as per pre-operative assessment  Comments: Atraumatic intubation, easy, good view with Glidescope. O2 sats 100% throughout procedure

## 2015-02-21 ENCOUNTER — Encounter (HOSPITAL_COMMUNITY): Payer: Self-pay | Admitting: General Surgery

## 2015-02-21 LAB — CBC
HCT: 41.3 % (ref 39.0–52.0)
Hemoglobin: 14 g/dL (ref 13.0–17.0)
MCH: 30.5 pg (ref 26.0–34.0)
MCHC: 33.9 g/dL (ref 30.0–36.0)
MCV: 90 fL (ref 78.0–100.0)
Platelets: 185 10*3/uL (ref 150–400)
RBC: 4.59 MIL/uL (ref 4.22–5.81)
RDW: 13.5 % (ref 11.5–15.5)
WBC: 11.5 10*3/uL — ABNORMAL HIGH (ref 4.0–10.5)

## 2015-02-21 LAB — BASIC METABOLIC PANEL
Anion gap: 9 (ref 5–15)
BUN: 13 mg/dL (ref 6–20)
CO2: 24 mmol/L (ref 22–32)
Calcium: 8.8 mg/dL — ABNORMAL LOW (ref 8.9–10.3)
Chloride: 103 mmol/L (ref 101–111)
Creatinine, Ser: 0.87 mg/dL (ref 0.61–1.24)
GFR calc Af Amer: >60 mL/min (ref >60)
GFR calc non Af Amer: >60 mL/min (ref >60)
Glucose, Bld: 151 mg/dL — ABNORMAL HIGH (ref 65–99)
Potassium: 3.9 mmol/L (ref 3.5–5.1)
Sodium: 136 mmol/L (ref 135–145)

## 2015-02-21 MED ORDER — DIPHENHYDRAMINE HCL 50 MG/ML IJ SOLN
12.5000 mg | Freq: Four times a day (QID) | INTRAMUSCULAR | Status: DC | PRN
  Administered 2015-02-22: 12.5 mg via INTRAVENOUS
  Filled 2015-02-21: qty 1

## 2015-02-21 MED ORDER — NALOXONE HCL 0.4 MG/ML IJ SOLN: 0.4000 mg | INTRAMUSCULAR | Status: DC | PRN

## 2015-02-21 MED ORDER — SODIUM CHLORIDE 0.9 % IJ SOLN: 9.0000 mL | INTRAMUSCULAR | Status: DC | PRN

## 2015-02-21 MED ORDER — KETOROLAC TROMETHAMINE 30 MG/ML IJ SOLN
30.0000 mg | Freq: Four times a day (QID) | INTRAMUSCULAR | Status: DC
  Administered 2015-02-21 – 2015-02-24 (×8): 30 mg via INTRAVENOUS
  Filled 2015-02-21 (×17): qty 1

## 2015-02-21 MED ORDER — ONDANSETRON HCL 4 MG/2ML IJ SOLN
4.0000 mg | Freq: Four times a day (QID) | INTRAMUSCULAR | Status: DC | PRN
  Filled 2015-02-21: qty 2

## 2015-02-21 MED ORDER — DIPHENHYDRAMINE HCL 12.5 MG/5ML PO ELIX: 12.5000 mg | ORAL_SOLUTION | Freq: Four times a day (QID) | ORAL | Status: DC | PRN

## 2015-02-21 MED ORDER — MORPHINE SULFATE 1 MG/ML IV SOLN
INTRAVENOUS | Status: DC
  Administered 2015-02-21 – 2015-02-22 (×2): via INTRAVENOUS
  Administered 2015-02-22: 1.5 mg via INTRAVENOUS
  Administered 2015-02-22: 9 mg via INTRAVENOUS
  Administered 2015-02-22: 3 mg via INTRAVENOUS
  Administered 2015-02-23: 1.5 mg via INTRAVENOUS
  Administered 2015-02-23: 7.5 mg via INTRAVENOUS
  Administered 2015-02-23: 10.5 mg via INTRAVENOUS
  Administered 2015-02-23: 7.5 mg via INTRAVENOUS
  Filled 2015-02-21 (×2): qty 25

## 2015-02-21 NOTE — Progress Notes (Signed)
Patient ID: Chad Lyons, male   DOB: July 16, 1966, 49 y.o.   MRN: [id number] 1 Day Post-Op  Subjective: Some pain at incision, trying to hold back on Dilaudid PCA due to itching.  No other C/O, no nausea  Objective: Vital signs in last 24 hours: Temp:  [97.8 F (36.6 C)-99.5 F (37.5 C)] 98 F (36.7 C) (07/06 1411) Pulse Rate:  [56-76] 61 (07/06 1411) Resp:  [13-20] 16 (07/06 1554) BP: (135-144)/(73-85) 144/78 mmHg (07/06 1411) SpO2:  [94 %-100 %] 99 % (07/06 1554)    Intake/Output from previous day: 07/05 0701 - 07/06 0700 In: 4096.7 [P.O.:240; I.V.:3356.7; IV Piggyback:500] Out: 1410 [Urine:975; Drains:435] Intake/Output this shift: Total I/O In: 240 [P.O.:240] Out: 580 [Urine:500; Drains:80]  General appearance: alert, cooperative and no distress Resp: clear to auscultation bilaterally GI: mild incisional tenderness Incision/Wound: Dressing clean and dry  Lab Results:   Recent Labs  02/21/15 0423  WBC 11.5*  HGB 14.0  HCT 41.3  PLT 185   BMET  Recent Labs  02/21/15 0423  NA 136  K 3.9  CL 103  CO2 24  GLUCOSE 151*  BUN 13  CREATININE 0.87  CALCIUM 8.8*     Studies/Results: No results found.  Anti-infectives: Anti-infectives    Start     Dose/Rate Route Frequency Ordered Stop   02/20/15 0600  vancomycin (VANCOCIN) 1,500 mg in sodium chloride 0.9 % 500 mL IVPB     1,500 mg 250 mL/hr over 120 Minutes Intravenous On call to O.R. 02/19/15 1522 02/20/15 0831      Assessment/Plan: s/p Procedure(s): OPEN REPAIR VENTRAL INCISIONAL HERNIA INSERTION OF MESH Doing well, itching from Dilaudid.  Switch to MS PCA, add Toradol   LOS: 1 day    Matheu Ploeger T 02/21/2015

## 2015-02-21 NOTE — Progress Notes (Signed)
Verbal order to change Dilaudid PCA to Morphine PCA Full dose, order entered Neta Mends RN 5:53 PM 02-21-2015

## 2015-02-22 MED ORDER — OXYCODONE HCL 5 MG PO TABS
10.0000 mg | ORAL_TABLET | ORAL | Status: DC | PRN
  Administered 2015-02-23 – 2015-02-24 (×3): 10 mg via ORAL
  Filled 2015-02-22 (×3): qty 2

## 2015-02-22 NOTE — Progress Notes (Signed)
Patient ID: Chad Lyons, male   DOB: 04-Mar-1966, 49 y.o.   MRN: [id number] 2 Days Post-Op  Subjective: Feels better today, less pain, tol CL diet, pos flatus  Objective: Vital signs in last 24 hours: Temp:  [98 F (36.7 C)-98.9 F (37.2 C)] 98.4 F (36.9 C) (07/07 1000) Pulse Rate:  [52-61] 58 (07/07 1000) Resp:  [15-18] 17 (07/07 1202) BP: (136-157)/(78-94) 136/82 mmHg (07/07 1000) SpO2:  [98 %-100 %] 98 % (07/07 1202)    Intake/Output from previous day: 07/06 0701 - 07/07 0700 In: 1880 [P.O.:1080; I.V.:800] Out: 2725 [Urine:2500; Drains:225] Intake/Output this shift: Total I/O In: 360 [P.O.:360] Out: 1175 [Urine:1150; Drains:25]  General appearance: alert, cooperative and no distress Incision/Wound: Dressing clean and dry  Lab Results:   Recent Labs  02/21/15 0423  WBC 11.5*  HGB 14.0  HCT 41.3  PLT 185   BMET  Recent Labs  02/21/15 0423  NA 136  K 3.9  CL 103  CO2 24  GLUCOSE 151*  BUN 13  CREATININE 0.87  CALCIUM 8.8*     Studies/Results: No results found.  Anti-infectives: Anti-infectives    Start     Dose/Rate Route Frequency Ordered Stop   02/20/15 0600  vancomycin (VANCOCIN) 1,500 mg in sodium chloride 0.9 % 500 mL IVPB     1,500 mg 250 mL/hr over 120 Minutes Intravenous On call to O.R. 02/19/15 1522 02/20/15 0831      Assessment/Plan: s/p Procedure(s): OPEN REPAIR VENTRAL INCISIONAL HERNIA INSERTION OF MESH Doing well, advance diet, ambulation encouraged, prob discharge 1-2 days   LOS: 2 days    Licia Harl T 02/22/2015

## 2015-02-23 MED ORDER — DIPHENHYDRAMINE HCL 50 MG/ML IJ SOLN: 12.5000 mg | Freq: Four times a day (QID) | INTRAMUSCULAR | Status: DC | PRN

## 2015-02-23 MED ORDER — OXYCODONE HCL 10 MG PO TABS: 10.0000 mg | ORAL_TABLET | ORAL | Status: DC | PRN

## 2015-02-23 MED ORDER — DIPHENHYDRAMINE HCL 12.5 MG/5ML PO ELIX
12.5000 mg | ORAL_SOLUTION | Freq: Four times a day (QID) | ORAL | Status: DC | PRN
  Administered 2015-02-23: 12.5 mg via ORAL
  Filled 2015-02-23: qty 5

## 2015-02-23 NOTE — Progress Notes (Signed)
Patient ID: Chad Lyons, male   DOB: 22-Jun-1966, 49 y.o.   MRN: [id number] 3 Days Post-Op  Subjective: Pain controlled with oral meds.  Chad Lyons snot been very active.  Tol diet  Objective: Vital signs in last 24 hours: Temp:  [98.2 F (36.8 C)-98.9 F (37.2 C)] 98.7 F (37.1 C) (07/08 0605) Pulse Rate:  [58-68] 62 (07/08 0605) Resp:  [14-18] 14 (07/08 0800) BP: (131-145)/(71-88) 145/88 mmHg (07/08 0605) SpO2:  [91 %-100 %] 91 % (07/08 0800) Last BM Date: 02/22/15  Intake/Output from previous day: 07/07 0701 - 07/08 0700 In: 3438.3 [P.O.:1440; I.V.:1998.3] Out: 2795 [Urine:2700; Drains:95] Intake/Output this shift:    General appearance: alert, cooperative and no distress GI: Mild incisional tenderness Incision/Wound: Clean and dry.  Jp drainage serosanguinous  Lab Results:   Recent Labs  02/21/15 0423  WBC 11.5*  HGB 14.0  HCT 41.3  PLT 185   BMET  Recent Labs  02/21/15 0423  NA 136  K 3.9  CL 103  CO2 24  GLUCOSE 151*  BUN 13  CREATININE 0.87  CALCIUM 8.8*     Studies/Results: No results found.  Anti-infectives: Anti-infectives    Start     Dose/Rate Route Frequency Ordered Stop   02/20/15 0600  vancomycin (VANCOCIN) 1,500 mg in sodium chloride 0.9 % 500 mL IVPB     1,500 mg 250 mL/hr over 120 Minutes Intravenous On call to O.R. 02/19/15 1522 02/20/15 0831      Assessment/Plan: s/p Procedure(s): OPEN REPAIR VENTRAL INCISIONAL HERNIA INSERTION OF MESH Doing well.  Increase activity today. Plan discharge tomorrow. Need right side JP drain removed at time of discharge   LOS: 3 days    Chad Lyons T 02/23/2015

## 2015-02-23 NOTE — Plan of Care (Signed)
Problem: Phase I Progression Outcomes Goal: OOB as tolerated unless otherwise ordered Outcome: Completed/Met Date Met:  02/23/15 Pt encourage to ambulate several times today.

## 2015-02-24 MED ORDER — OXYCODONE HCL 10 MG PO TABS: 10.0000 mg | ORAL_TABLET | ORAL | Status: DC | PRN

## 2015-02-24 NOTE — Discharge Summary (Signed)
  Patient ID: Chad Lyons [id number] 49 y.o. February 12, 1966  02/20/2015  Discharge date and time: No discharge date for patient encounter.  Admitting Physician: Rosario Adie  Discharge Physician: Rosario Adie.  Admission Diagnoses: ventral hernia  Discharge Diagnoses: same  Operations: Procedure(s): OPEN REPAIR VENTRAL INCISIONAL HERNIA INSERTION OF MESH    Discharged Condition: good    Hospital Course: Patient admitted after surgery.  His diet was advanced as tolerated.  He was discharged home once tolerating a diet and having bowel function and pain controlled with PO medications.  Consults: none  Significant Diagnostic Studies: labs: cbc, chemistry  Treatments: IV hydration, analgesia: acetaminophen w/ codeine and surgery: ventral hernia repair  Disposition: Home

## 2015-02-24 NOTE — Progress Notes (Addendum)
Assessment unchanged. Pt and wife verbalized understanding of dc instructions through teach back. Both verbalized w/demo of how to empty bulb drain and recharge. Dressing supplies, measuring cups and literature on how to care for drain provided. Dressing on rt abd clean, dry and intact. Denies pain at present. Discharged via wc to front entrance to meet awaiting vehicle to carry home. Accompanied by NT and wife.

## 2015-02-24 NOTE — Progress Notes (Signed)
Dr. Johney Maine aware via phone when RT abd drain dc'd as ordered prior to dc, small amt of tissue with coagulated blood on it protruding from opening. Approximately 1 inch in length. No c/o pain, no s/o infection and  no active drainage. No new orders received. MD advised to dress with gauze and instruct pt to keep dressing on with daily changes til f/u next week. Pt verbalized understanding of instructions and articulated back to nurse " I've got to follow up next week for staple removal and my other drain to be removed."

## 2015-02-24 NOTE — Discharge Instructions (Signed)

## 2015-03-20 ENCOUNTER — Other Ambulatory Visit: Payer: Self-pay | Admitting: Internal Medicine

## 2015-06-27 ENCOUNTER — Encounter: Payer: Self-pay | Admitting: Adult Health

## 2015-06-27 ENCOUNTER — Ambulatory Visit (INDEPENDENT_AMBULATORY_CARE_PROVIDER_SITE_OTHER): Payer: BLUE CROSS/BLUE SHIELD | Admitting: Adult Health

## 2015-06-27 VITALS — BP 128/90 | HR 76 | Temp 98.4°F | Wt 297.4 lb

## 2015-06-27 DIAGNOSIS — J0110 Acute frontal sinusitis, unspecified: Secondary | ICD-10-CM

## 2015-06-27 MED ORDER — DOXYCYCLINE HYCLATE 100 MG PO CAPS: 100.0000 mg | ORAL_CAPSULE | Freq: Two times a day (BID) | ORAL | Status: DC

## 2015-06-27 MED ORDER — HYDROCODONE-HOMATROPINE 5-1.5 MG/5ML PO SYRP: 5.0000 mL | ORAL_SOLUTION | Freq: Three times a day (TID) | ORAL | Status: DC | PRN

## 2015-06-27 NOTE — Patient Instructions (Addendum)
It was great meeting you today!  It appears you have a sinus infection. I have sent in a prescription for Doxycycline. Take this twice a day for 7 days.   Use flonase twice a day   Take the cough syrup at night as it will make you sleepy.   If you are not feeling any better in the next 2-3 days please let me know  Sinusitis, Adult Sinusitis is redness, soreness, and inflammation of the paranasal sinuses. Paranasal sinuses are air pockets within the bones of your face. They are located beneath your eyes, in the middle of your forehead, and above your eyes. In healthy paranasal sinuses, mucus is able to drain out, and air is able to circulate through them by way of your nose. However, when your paranasal sinuses are inflamed, mucus and air can become trapped. This can allow bacteria and other germs to grow and cause infection. Sinusitis can develop quickly and last only a short time (acute) or continue over a long period (chronic). Sinusitis that lasts for more than 12 weeks is considered chronic. CAUSES Causes of sinusitis include:  Allergies.  Structural abnormalities, such as displacement of the cartilage that separates your nostrils (deviated septum), which can decrease the air flow through your nose and sinuses and affect sinus drainage.  Functional abnormalities, such as when the small hairs (cilia) that line your sinuses and help remove mucus do not work properly or are not present. SIGNS AND SYMPTOMS Symptoms of acute and chronic sinusitis are the same. The primary symptoms are pain and pressure around the affected sinuses. Other symptoms include:  Upper toothache.  Earache.  Headache.  Bad breath.  Decreased sense of smell and taste.  A cough, which worsens when you are lying flat.  Fatigue.  Fever.  Thick drainage from your nose, which often is green and may contain pus (purulent).  Swelling and warmth over the affected sinuses. DIAGNOSIS Your health care provider  will perform a physical exam. During your exam, your health care provider may perform any of the following to help determine if you have acute sinusitis or chronic sinusitis:  Look in your nose for signs of abnormal growths in your nostrils (nasal polyps).  Tap over the affected sinus to check for signs of infection.  View the inside of your sinuses using an imaging device that has a light attached (endoscope). If your health care provider suspects that you have chronic sinusitis, one or more of the following tests may be recommended:  Allergy tests.  Nasal culture. A sample of mucus is taken from your nose, sent to a lab, and screened for bacteria.  Nasal cytology. A sample of mucus is taken from your nose and examined by your health care provider to determine if your sinusitis is related to an allergy. TREATMENT Most cases of acute sinusitis are related to a viral infection and will resolve on their own within 10 days. Sometimes, medicines are prescribed to help relieve symptoms of both acute and chronic sinusitis. These may include pain medicines, decongestants, nasal steroid sprays, or saline sprays. However, for sinusitis related to a bacterial infection, your health care provider will prescribe antibiotic medicines. These are medicines that will help kill the bacteria causing the infection. Rarely, sinusitis is caused by a fungal infection. In these cases, your health care provider will prescribe antifungal medicine. For some cases of chronic sinusitis, surgery is needed. Generally, these are cases in which sinusitis recurs more than 3 times per year, despite other  treatments. HOME CARE INSTRUCTIONS  Drink plenty of water. Water helps thin the mucus so your sinuses can drain more easily.  Use a humidifier.  Inhale steam 3-4 times a day (for example, sit in the bathroom with the shower running).  Apply a warm, moist washcloth to your face 3-4 times a day, or as directed by your health  care provider.  Use saline nasal sprays to help moisten and clean your sinuses.  Take medicines only as directed by your health care provider.  If you were prescribed either an antibiotic or antifungal medicine, finish it all even if you start to feel better. SEEK IMMEDIATE MEDICAL CARE IF:  You have increasing pain or severe headaches.  You have nausea, vomiting, or drowsiness.  You have swelling around your face.  You have vision problems.  You have a stiff neck.  You have difficulty breathing.   This information is not intended to replace advice given to you by your health care provider. Make sure you discuss any questions you have with your health care provider.   Document Released: 08/04/2005 Document Revised: 08/25/2014 Document Reviewed: 08/19/2011 Elsevier Interactive Patient Education [employer].

## 2015-06-27 NOTE — Progress Notes (Signed)
   Subjective:    Patient ID: Chad Lyons, male    DOB: 23-Jul-1966, 49 y.o.   MRN: [id number]  HPI  49 year old male who presents to the office today for the acute complaint of sinus infection since September 14th. He has periods where he feels better but then his symptoms come back. He complains of chest congestion with productive cough. He has sinus pain and pressure with headaches. He also has rhinorrhea. He endorses subject fever on and off for the last month.  His wife was sick with the same symptoms but has recovered.  Review of Systems  Constitutional: Positive for fever, diaphoresis and fatigue. Negative for chills.  HENT: Positive for congestion, rhinorrhea, sinus pressure and sore throat. Negative for ear discharge, ear pain, trouble swallowing and voice change.   Respiratory: Positive for cough and chest tightness. Negative for shortness of breath.   Cardiovascular: Negative.   Neurological: Positive for headaches. Negative for facial asymmetry, weakness and light-headedness.  Hematological: Negative.        Objective:   Physical Exam  Constitutional: He is oriented to person, place, and time. He appears well-developed and well-nourished. No distress.  HENT:  Head: Normocephalic and atraumatic.  Right Ear: External ear normal.  Left Ear: External ear normal.  Nose: Nose normal.  Mouth/Throat: Oropharynx is clear and moist. No oropharyngeal exudate.  TM's visualized, no acute infection.  + PND  Eyes: Right eye exhibits no discharge. Left eye exhibits no discharge.  Cardiovascular: Normal rate, regular rhythm, normal heart sounds and intact distal pulses.  Exam reveals no gallop and no friction rub.   No murmur heard. Pulmonary/Chest: Effort normal and breath sounds normal. No respiratory distress. He has no wheezes. He has no rales. He exhibits no tenderness.  Wet sounding cough   Lymphadenopathy:    He has cervical adenopathy.  Neurological: He is alert and oriented to  person, place, and time.  Skin: Skin is warm and dry. No rash noted. He is not diaphoretic. No erythema. No pallor.  Psychiatric: He has a normal mood and affect. His behavior is normal. Judgment and thought content normal.  Nursing note and vitals reviewed.     Assessment & Plan:  1. Acute frontal sinusitis, recurrence not specified - Will treat as bacterial sinus infection due to symptoms - doxycycline (VIBRAMYCIN) 100 MG capsule; Take 1 capsule (100 mg total) by mouth 2 (two) times daily.  Dispense: 14 capsule; Refill: 0 - HYDROcodone-homatropine (HYCODAN) 5-1.5 MG/5ML syrup; Take 5 mLs by mouth every 8 (eight) hours as needed for cough.  Dispense: 120 mL; Refill: 0 - Follow up if no improvement in the next 2-3 days.

## 2015-06-27 NOTE — Progress Notes (Signed)
Pre visit review using our clinic review tool, if applicable. No additional management support is needed unless otherwise documented below in the visit note. 

## 2015-07-18 ENCOUNTER — Telehealth: Payer: Self-pay | Admitting: Internal Medicine

## 2015-07-18 NOTE — Telephone Encounter (Signed)
Pt would like dr Raliegh Ip to accept his brother as new pt ,his brother is 50 years old and just had heart attack and no insurance will be self pay. Can I sch?

## 2015-07-19 NOTE — Telephone Encounter (Signed)
Dr. K, please see message and advise. 

## 2015-07-20 NOTE — Telephone Encounter (Signed)
I am not accepting any new patients.

## 2015-07-23 ENCOUNTER — Telehealth: Payer: Self-pay | Admitting: Adult Health

## 2015-07-23 NOTE — Telephone Encounter (Signed)
He needs to keep appointment.

## 2015-07-23 NOTE — Telephone Encounter (Signed)
lmom for pt to call back

## 2015-07-23 NOTE — Telephone Encounter (Signed)
Pt is aware.  

## 2015-07-23 NOTE — Telephone Encounter (Signed)
Pt is aware and has kept his appt on 12.6.16

## 2015-07-23 NOTE — Telephone Encounter (Signed)
Mr. Mastalski called saying his sinus symptoms have returned and they're worse. He's wondering if he needs to keep his appt with Tommi Rumps on tomorrow or if a Rx refill of the antibiotics he was prescribed 2-3wks ago can be sent to his pharmacy. Please give the pt a call regarding this.  Pt ph# [phone] Thank you.

## 2015-07-24 ENCOUNTER — Ambulatory Visit (INDEPENDENT_AMBULATORY_CARE_PROVIDER_SITE_OTHER)
Admission: RE | Admit: 2015-07-24 | Discharge: 2015-07-24 | Disposition: A | Discharge status: Home / Self Care | Payer: BLUE CROSS/BLUE SHIELD | Source: Ambulatory Visit | Attending: Adult Health | Admitting: Adult Health

## 2015-07-24 ENCOUNTER — Other Ambulatory Visit: Payer: Self-pay | Admitting: Adult Health

## 2015-07-24 ENCOUNTER — Ambulatory Visit (INDEPENDENT_AMBULATORY_CARE_PROVIDER_SITE_OTHER): Payer: BLUE CROSS/BLUE SHIELD | Admitting: Adult Health

## 2015-07-24 ENCOUNTER — Telehealth: Payer: Self-pay | Admitting: Adult Health

## 2015-07-24 ENCOUNTER — Encounter: Payer: Self-pay | Admitting: Adult Health

## 2015-07-24 DIAGNOSIS — R05 Cough: Secondary | ICD-10-CM | POA: Diagnosis not present

## 2015-07-24 DIAGNOSIS — J209 Acute bronchitis, unspecified: Secondary | ICD-10-CM

## 2015-07-24 DIAGNOSIS — R059 Cough, unspecified: Secondary | ICD-10-CM

## 2015-07-24 DIAGNOSIS — J0121 Acute recurrent ethmoidal sinusitis: Secondary | ICD-10-CM

## 2015-07-24 MED ORDER — AMOXICILLIN-POT CLAVULANATE 875-125 MG PO TABS: 1.0000 | ORAL_TABLET | Freq: Two times a day (BID) | ORAL | Status: DC

## 2015-07-24 MED ORDER — METHYLPREDNISOLONE 4 MG PO TBPK: ORAL_TABLET | ORAL | Status: DC

## 2015-07-24 MED ORDER — CYCLOBENZAPRINE HCL 10 MG PO TABS: 10.0000 mg | ORAL_TABLET | Freq: Three times a day (TID) | ORAL | Status: DC | PRN

## 2015-07-24 NOTE — Telephone Encounter (Signed)
Spoke to patient on the phone and informed him of his chest xray. No acute pulmonary process.   Will treat as bronchitis and sinusitis. He has taken Augmentin in the past without side effect due to PCN intolerance (itching). Denies any angio edema or anaphylactic reaction.   1) Medrol Dose pack  2) 10 day course of Augmentin

## 2015-07-24 NOTE — Patient Instructions (Signed)
It was great seeing you again, but I wish it was not under these circumstances.   I will follow up with you regarding the chest xray and then we can decide what medications to use.   I have sent in a prescription for Flexeril to the pharmacy.   Please let me know if you need anything else

## 2015-07-24 NOTE — Progress Notes (Signed)
   Subjective:    Patient ID: Chad Lyons, male    DOB: 30-Dec-1965, 49 y.o.   MRN: [id number]  HPI  49 year old male who presents to the office today for continued productive cough, sinus pain and pressure as well as night sweats, subjective day time fever, and feeling ill over all. I last saw Corrion on 06/27/2015 for sinusitis and prescribed a 7 day course of Doxy as well as Hycodan for the cough. He felt like he was getting better but had to go to [facility] emergently because his younger brother had a heart attack. He feels like when he was visiting his brother in the hospital his symptoms returned.   He has been trying Mucinex during the day as well as OTC sinus medication and sudafed, with little relief.   The hycodan works well at night to control the cough   Review of Systems  Constitutional: Positive for fever, chills, diaphoresis, activity change and fatigue. Negative for unexpected weight change.  HENT: Positive for congestion, postnasal drip, sinus pressure and sore throat. Negative for ear discharge, ear pain and rhinorrhea.   Respiratory: Positive for cough, chest tightness and shortness of breath.   Cardiovascular: Negative.   Gastrointestinal: Negative.   Musculoskeletal: Positive for myalgias (across shoulders).  Skin: Negative.   Neurological: Positive for headaches. Negative for dizziness, weakness and light-headedness.  Psychiatric/Behavioral: Positive for sleep disturbance (from the cough).  All other systems reviewed and are negative.      Objective:   Physical Exam  Constitutional: He is oriented to person, place, and time. He appears well-developed and well-nourished. No distress.  HENT:  Head: Normocephalic and atraumatic.  Right Ear: External ear normal.  Left Ear: External ear normal.  Nose: Nose normal.  Mouth/Throat: Oropharynx is clear and moist.  TM's visualized. No signs of infection +PND  Eyes: Conjunctivae and EOM are normal. Pupils are equal,  round, and reactive to light. Right eye exhibits no discharge. Left eye exhibits no discharge. No scleral icterus.  Neck: Normal range of motion. Neck supple. No thyromegaly present.  Cardiovascular: Normal rate, regular rhythm, normal heart sounds and intact distal pulses.  Exam reveals no gallop and no friction rub.   No murmur heard. Pulmonary/Chest: Effort normal and breath sounds normal. No stridor. No respiratory distress. He has no wheezes. He has no rales. He exhibits no tenderness.  Musculoskeletal: Normal range of motion. He exhibits no edema or tenderness.  Lymphadenopathy:    He has cervical adenopathy (submandibular).  Neurological: He is alert and oriented to person, place, and time.  Skin: Skin is warm and dry. No rash noted. He is not diaphoretic. No erythema. No pallor.  Psychiatric: He has a normal mood and affect. His behavior is normal. Judgment and thought content normal.  Nursing note and vitals reviewed.      Assessment & Plan:   1. Acute bronchitis, unspecified organism - DG Chest 2 View; Future- Negative for PNA - Medrol Dose Pack  2. Acute recurrent ethmoidal sinusitis  Augmentin BID x 10 days.  - Follow up if no improvement.

## 2015-07-24 NOTE — Progress Notes (Signed)
Pre visit review using our clinic review tool, if applicable. No additional management support is needed unless otherwise documented below in the visit note. 

## 2015-08-23 ENCOUNTER — Other Ambulatory Visit: Payer: Self-pay | Admitting: Internal Medicine

## 2015-09-10 ENCOUNTER — Other Ambulatory Visit: Payer: BLUE CROSS/BLUE SHIELD

## 2015-09-17 ENCOUNTER — Encounter: Payer: BLUE CROSS/BLUE SHIELD | Admitting: Internal Medicine

## 2015-09-29 ENCOUNTER — Other Ambulatory Visit: Payer: Self-pay | Admitting: Internal Medicine

## 2015-11-14 ENCOUNTER — Other Ambulatory Visit: Payer: Self-pay | Admitting: Internal Medicine

## 2015-12-01 ENCOUNTER — Other Ambulatory Visit: Payer: Self-pay | Admitting: Internal Medicine

## 2016-02-13 ENCOUNTER — Other Ambulatory Visit: Payer: Self-pay | Admitting: Internal Medicine

## 2016-03-01 ENCOUNTER — Other Ambulatory Visit: Payer: Self-pay | Admitting: Internal Medicine

## 2016-03-03 NOTE — Telephone Encounter (Signed)
Rx refill sent to pharmacy. 

## 2016-03-08 ENCOUNTER — Other Ambulatory Visit: Payer: Self-pay | Admitting: Internal Medicine

## 2016-04-11 ENCOUNTER — Telehealth: Payer: Self-pay | Admitting: Internal Medicine

## 2016-04-11 ENCOUNTER — Encounter: Payer: Self-pay | Admitting: Adult Health

## 2016-04-11 ENCOUNTER — Other Ambulatory Visit: Payer: Self-pay | Admitting: Adult Health

## 2016-04-11 ENCOUNTER — Ambulatory Visit (INDEPENDENT_AMBULATORY_CARE_PROVIDER_SITE_OTHER)
Admission: RE | Admit: 2016-04-11 | Discharge: 2016-04-11 | Disposition: A | Discharge status: Home / Self Care | Payer: BLUE CROSS/BLUE SHIELD | Source: Ambulatory Visit | Attending: Adult Health | Admitting: Adult Health

## 2016-04-11 ENCOUNTER — Ambulatory Visit (INDEPENDENT_AMBULATORY_CARE_PROVIDER_SITE_OTHER): Payer: BLUE CROSS/BLUE SHIELD | Admitting: Adult Health

## 2016-04-11 VITALS — BP 142/72 | Temp 98.6°F | Ht 74.0 in | Wt 339.0 lb

## 2016-04-11 DIAGNOSIS — R079 Chest pain, unspecified: Secondary | ICD-10-CM

## 2016-04-11 DIAGNOSIS — J0110 Acute frontal sinusitis, unspecified: Secondary | ICD-10-CM

## 2016-04-11 DIAGNOSIS — R059 Cough, unspecified: Secondary | ICD-10-CM

## 2016-04-11 DIAGNOSIS — R05 Cough: Secondary | ICD-10-CM | POA: Diagnosis not present

## 2016-04-11 MED ORDER — HYDROCODONE-HOMATROPINE 5-1.5 MG/5ML PO SYRP: 5.0000 mL | ORAL_SOLUTION | Freq: Three times a day (TID) | ORAL | 0 refills | Status: DC | PRN

## 2016-04-11 MED ORDER — PREDNISONE 10 MG PO TABS: ORAL_TABLET | ORAL | 0 refills | Status: DC

## 2016-04-11 NOTE — Telephone Encounter (Signed)
Noted  

## 2016-04-11 NOTE — Progress Notes (Signed)
Subjective:    Patient ID: Chad Lyons, male    DOB: Aug 26, 1965, 50 y.o.   MRN: [id number]  HPI  50 year old male who presents to the office today for six weeks of semi productive cough and non radiating left sided chest pain ( under the breast). He reports that about six weeks he was riding in a car on the saw to [place] with a friend who was coughing not feeling well. A few days later his cough started with left sided chest pain. The cough he is experiencing has not become any better.   He denies any sinus pain and pressure, fevers, feeling acutely ill, n/v/d.   Review of Systems  Constitutional: Negative.   HENT: Positive for sore throat and voice change. Negative for congestion, ear discharge, ear pain, postnasal drip, rhinorrhea, sinus pressure, tinnitus and trouble swallowing.   Respiratory: Positive for cough, chest tightness, shortness of breath (with exertion ) and wheezing.   Cardiovascular: Negative.   Genitourinary: Negative.   Musculoskeletal: Negative.   Skin: Negative.   Neurological: Negative.   All other systems reviewed and are negative.  Past Medical History:  Diagnosis Date  . Diverticulitis   . Hypertension   . Ventral incisional hernia     Social History   Social History  . Marital status: Married    Spouse name: N/A  . Number of children: N/A  . Years of education: N/A   Occupational History  . Not on file.   Social History Main Topics  . Smoking status: Former Smoker    Types: Cigars    Quit date: 10/11/2004  . Smokeless tobacco: Never Used  . Alcohol use Yes     Comment: socially  . Drug use: No  . Sexual activity: Not on file     Comment: still smokes cigars   Other Topics Concern  . Not on file   Social History Narrative  . No narrative on file    Past Surgical History:  Procedure Laterality Date  . COLON SURGERY  2006   colon resection for polyps  . HIATAL HERNIA REPAIR  1991  . INSERTION OF MESH N/A 02/20/2015   Procedure:  INSERTION OF MESH;  Surgeon: Excell Seltzer, MD;  Location: [facility];  Service: General;  Laterality: N/A;  . LAPAROSCOPIC GASTRIC BANDING  2011  . VENTRAL HERNIA REPAIR N/A 02/20/2015   Procedure: OPEN REPAIR VENTRAL INCISIONAL HERNIA;  Surgeon: Excell Seltzer, MD;  Location: [facility];  Service: General;  Laterality: N/A;    Family History  Problem Relation Age of Onset  . Heart disease Mother   . Heart disease Maternal Aunt   . Cancer Maternal Uncle     pancreatic  . Heart disease Maternal Uncle     Allergies  Allergen Reactions  . Hydromet [Hydrocodone-Homatropine] Itching  . Iohexol       Desc: PT DEVELOPS ITCHING AND HIVES SEVERAL HOURS AFTER INJECTION OF OMNIPAQUE   . Levofloxacin     REACTION: unspecified  . Penicillins     Current Outpatient Prescriptions on File Prior to Visit  Medication Sig Dispense Refill  . ALPRAZolam (XANAX) 0.5 MG tablet TAKE ONE TABLET BY MOUTH TWICE DAILY 60 tablet 0  . amoxicillin-clavulanate (AUGMENTIN) 875-125 MG tablet Take 1 tablet by mouth 2 (two) times daily. 20 tablet 0  . cyclobenzaprine (FLEXERIL) 10 MG tablet Take 1 tablet (10 mg total) by mouth 3 (three) times daily as needed for muscle spasms. 30 tablet 0  .  HYDROcodone-homatropine (HYCODAN) 5-1.5 MG/5ML syrup Take 5 mLs by mouth every 8 (eight) hours as needed for cough. 120 mL 0  . ibuprofen (ADVIL,MOTRIN) 600 MG tablet TAKE ONE TABLET BY MOUTH EVERY 6 HOURS AS NEEDED 30 tablet 4  . lisinopril-hydrochlorothiazide (PRINZIDE,ZESTORETIC) 20-25 MG tablet TAKE 1 TABLET BY MOUTH DAILY 90 tablet 0  . meclizine (ANTIVERT) 25 MG tablet Take 1 tablet (25 mg total) by mouth 3 (three) times daily as needed for dizziness. 30 tablet 2  . methylPREDNISolone (MEDROL DOSEPAK) 4 MG TBPK tablet Take as directed 21 tablet 0  . Oxycodone HCl 10 MG TABS Take 1 tablet (10 mg total) by mouth every 4 (four) hours as needed. 50 tablet 0  . pseudoephedrine-guaifenesin (MUCINEX D) 60-600 MG per tablet Take 1  tablet by mouth every 12 (twelve) hours.    . sertraline (ZOLOFT) 50 MG tablet TAKE 1 TABLET BY MOUTH DAILY 90 tablet 0  . triamcinolone cream (KENALOG) 0.1 % APPLY TOPICALLY TWICE DAILY AS DIRECTED 30 g 2  . verapamil (CALAN-SR) 240 MG CR tablet TAKE ONE (1) TABLET BY MOUTH AT BEDTIME 90 tablet 0   No current facility-administered medications on file prior to visit.     BP (!) 142/72   Temp 98.6 F (37 C) (Oral)   Ht 6\' 2"  (1.88 m)   Wt (!) 339 lb (153.8 kg)   BMI 43.53 kg/m       Objective:   Physical Exam  Constitutional: He is oriented to person, place, and time. He appears well-developed and well-nourished. No distress.  Morbidly obese  HENT:  Head: Normocephalic and atraumatic.  Right Ear: External ear normal.  Left Ear: External ear normal.  Nose: Nose normal.  Mouth/Throat: Oropharynx is clear and moist. No oropharyngeal exudate.  Eyes: Conjunctivae and EOM are normal. Pupils are equal, round, and reactive to light. Right eye exhibits no discharge. Left eye exhibits no discharge. No scleral icterus.  Neck: Normal range of motion. Neck supple.  Cardiovascular: Normal rate, regular rhythm, normal heart sounds and intact distal pulses.  Exam reveals no gallop and no friction rub.   No murmur heard. Pulmonary/Chest: Effort normal and breath sounds normal. He has no rales. He exhibits tenderness (under left breast).  Abdominal: Soft. Bowel sounds are normal. He exhibits no distension and no mass. There is no tenderness. There is no rebound and no guarding.  Musculoskeletal: Normal range of motion. He exhibits no edema or deformity.  Lymphadenopathy:    He has no cervical adenopathy.  Neurological: He is alert and oriented to person, place, and time.  Skin: Skin is warm and dry. No rash noted. He is not diaphoretic. No erythema. No pallor.  Psychiatric: He has a normal mood and affect. His behavior is normal. Judgment and thought content normal.  Nursing note and vitals  reviewed.     Assessment & Plan:   1. Cough - HYDROcodone-homatropine (HYCODAN) 5-1.5 MG/5ML syrup; Take 5 mLs by mouth every 8 (eight) hours as needed for cough.  Dispense: 120 mL; Refill: 0 - DG Chest 2 View; Future  2. Left sided chest pain - EKG 12-Lead- Sinus Brady, Left anterior fascicular. Non specific ST depression. Rate 63   Appeared as increased ST elevation on EKG. I spoke to Cardiologist, Dr. Lovena Le who confirmed that there was no increase in ST elevation   [name], NP

## 2016-04-11 NOTE — Telephone Encounter (Signed)
° °  Pt needed to reschedule his phy so I put 2 slots together on Monday 06/30/16 at 10;30

## 2016-04-16 ENCOUNTER — Other Ambulatory Visit: Payer: Self-pay | Admitting: *Deleted

## 2016-04-16 MED ORDER — ALPRAZOLAM 0.5 MG PO TABS: 0.5000 mg | ORAL_TABLET | Freq: Two times a day (BID) | ORAL | 3 refills | Status: DC

## 2016-04-16 MED ORDER — VERAPAMIL HCL ER 240 MG PO TBCR: EXTENDED_RELEASE_TABLET | ORAL | 0 refills | Status: DC

## 2016-04-16 MED ORDER — SERTRALINE HCL 50 MG PO TABS: 50.0000 mg | ORAL_TABLET | Freq: Every day | ORAL | 0 refills | Status: DC

## 2016-04-16 MED ORDER — LISINOPRIL-HYDROCHLOROTHIAZIDE 20-25 MG PO TABS: 1.0000 | ORAL_TABLET | Freq: Every day | ORAL | 0 refills | Status: DC

## 2016-04-16 MED ORDER — TRIAMCINOLONE ACETONIDE 0.1 % EX CREA: TOPICAL_CREAM | CUTANEOUS | 0 refills | Status: DC

## 2016-04-16 NOTE — Addendum Note (Signed)
Addended by: Marian Sorrow on: 04/16/2016 01:26 PM   Modules accepted: Orders

## 2016-05-26 DIAGNOSIS — Z9884 Bariatric surgery status: Secondary | ICD-10-CM | POA: Diagnosis not present

## 2016-06-20 ENCOUNTER — Other Ambulatory Visit: Payer: BLUE CROSS/BLUE SHIELD

## 2016-06-26 ENCOUNTER — Other Ambulatory Visit (INDEPENDENT_AMBULATORY_CARE_PROVIDER_SITE_OTHER): Payer: BLUE CROSS/BLUE SHIELD

## 2016-06-26 DIAGNOSIS — Z0000 Encounter for general adult medical examination without abnormal findings: Secondary | ICD-10-CM

## 2016-06-26 LAB — POC URINALSYSI DIPSTICK (AUTOMATED)
Bilirubin, UA: NEGATIVE
Blood, UA: NEGATIVE
Glucose, UA: NEGATIVE
Ketones, UA: NEGATIVE
Leukocytes, UA: NEGATIVE
Nitrite, UA: NEGATIVE
Protein, UA: NEGATIVE
Spec Grav, UA: 1.02
Urobilinogen, UA: 0.2
pH, UA: 5.5

## 2016-06-26 LAB — CBC WITH DIFFERENTIAL/PLATELET
Basophils Absolute: 0 10*3/uL (ref 0.0–0.1)
Basophils Relative: 0.5 % (ref 0.0–3.0)
Eosinophils Absolute: 0.2 10*3/uL (ref 0.0–0.7)
Eosinophils Relative: 2.4 % (ref 0.0–5.0)
HCT: 41.9 % (ref 39.0–52.0)
Hemoglobin: 13.9 g/dL (ref 13.0–17.0)
Lymphocytes Relative: 14.7 % (ref 12.0–46.0)
Lymphs Abs: 1.3 10*3/uL (ref 0.7–4.0)
MCHC: 33.2 g/dL (ref 30.0–36.0)
MCV: 90.2 fl (ref 78.0–100.0)
Monocytes Absolute: 0.9 10*3/uL (ref 0.1–1.0)
Monocytes Relative: 10.8 % (ref 3.0–12.0)
Neutro Abs: 6.1 10*3/uL (ref 1.4–7.7)
Neutrophils Relative %: 71.6 % (ref 43.0–77.0)
Platelets: 234 10*3/uL (ref 150.0–400.0)
RBC: 4.64 Mil/uL (ref 4.22–5.81)
RDW: 13.7 % (ref 11.5–15.5)
WBC: 8.6 10*3/uL (ref 4.0–10.5)

## 2016-06-26 LAB — LIPID PANEL
Cholesterol: 144 mg/dL (ref 0–200)
HDL: 54.2 mg/dL (ref >39.00)
LDL Cholesterol: 63 mg/dL (ref 0–99)
NonHDL: 90.26
Total CHOL/HDL Ratio: 3
Triglycerides: 138 mg/dL (ref 0.0–149.0)
VLDL: 27.6 mg/dL (ref 0.0–40.0)

## 2016-06-26 LAB — BASIC METABOLIC PANEL
BUN: 20 mg/dL (ref 6–23)
CO2: 29 mEq/L (ref 19–32)
Calcium: 9.7 mg/dL (ref 8.4–10.5)
Chloride: 105 mEq/L (ref 96–112)
Creatinine, Ser: 1.02 mg/dL (ref 0.40–1.50)
GFR: 98.93 mL/min (ref >60.00)
Glucose, Bld: 91 mg/dL (ref 70–99)
Potassium: 4.4 mEq/L (ref 3.5–5.1)
Sodium: 141 mEq/L (ref 135–145)

## 2016-06-26 LAB — HEPATIC FUNCTION PANEL
ALT: 33 U/L (ref 0–53)
AST: 30 U/L (ref 0–37)
Albumin: 4 g/dL (ref 3.5–5.2)
Alkaline Phosphatase: 55 U/L (ref 39–117)
Bilirubin, Direct: 0.1 mg/dL (ref 0.0–0.3)
Total Bilirubin: 0.6 mg/dL (ref 0.2–1.2)
Total Protein: 6.7 g/dL (ref 6.0–8.3)

## 2016-06-26 LAB — TSH: TSH: 0.87 u[IU]/mL (ref 0.35–4.50)

## 2016-06-26 LAB — PSA: PSA: 0.18 ng/mL (ref 0.10–4.00)

## 2016-06-27 ENCOUNTER — Other Ambulatory Visit: Payer: Self-pay | Admitting: Internal Medicine

## 2016-06-30 ENCOUNTER — Ambulatory Visit (INDEPENDENT_AMBULATORY_CARE_PROVIDER_SITE_OTHER): Payer: BLUE CROSS/BLUE SHIELD | Admitting: Internal Medicine

## 2016-06-30 ENCOUNTER — Encounter: Payer: Self-pay | Admitting: Internal Medicine

## 2016-06-30 ENCOUNTER — Other Ambulatory Visit: Payer: Self-pay | Admitting: *Deleted

## 2016-06-30 VITALS — BP 146/100 | HR 66 | Temp 98.6°F | Ht 73.25 in | Wt 360.2 lb

## 2016-06-30 DIAGNOSIS — G4733 Obstructive sleep apnea (adult) (pediatric): Secondary | ICD-10-CM | POA: Diagnosis not present

## 2016-06-30 DIAGNOSIS — Z23 Encounter for immunization: Secondary | ICD-10-CM | POA: Diagnosis not present

## 2016-06-30 DIAGNOSIS — Z0000 Encounter for general adult medical examination without abnormal findings: Secondary | ICD-10-CM

## 2016-06-30 DIAGNOSIS — I10 Essential (primary) hypertension: Secondary | ICD-10-CM

## 2016-06-30 MED ORDER — ALPRAZOLAM 0.5 MG PO TABS: 0.5000 mg | ORAL_TABLET | Freq: Every day | ORAL | 1 refills | Status: DC | PRN

## 2016-06-30 MED ORDER — LOSARTAN POTASSIUM-HCTZ 100-25 MG PO TABS: 1.0000 | ORAL_TABLET | Freq: Every day | ORAL | 3 refills | Status: DC

## 2016-06-30 NOTE — Progress Notes (Signed)
Subjective:    Patient ID: Chad Lyons, male    DOB: 1966-01-28, 50 y.o.   MRN: [id number]  HPI  50 year old patient who is seen today for a preventive health examination.  He has a history of morbid obesity and is status post LAP-BAND surgery.  He also has required surgery for a ventral hernia Medical problems include essential hypertension. Since his last visit here.  There is been some significant weight gain.  Review of systems positive for loud snoring with disruptive sleep and witnessed apnea.  He does admit to some daytime sleepiness  Past Medical History:  Diagnosis Date  . Diverticulitis   . Hypertension   . Ventral incisional hernia      Social History   Social History  . Marital status: Married    Spouse name: N/A  . Number of children: N/A  . Years of education: N/A   Occupational History  . Not on file.   Social History Main Topics  . Smoking status: Former Smoker    Types: Cigars    Quit date: 10/11/2004  . Smokeless tobacco: Never Used  . Alcohol use Yes     Comment: socially  . Drug use: No  . Sexual activity: Not on file     Comment: still smokes cigars   Other Topics Concern  . Not on file   Social History Narrative  . No narrative on file    Past Surgical History:  Procedure Laterality Date  . COLON SURGERY  2006   colon resection for polyps  . HIATAL HERNIA REPAIR  1991  . INSERTION OF MESH N/A 02/20/2015   Procedure: INSERTION OF MESH;  Surgeon: Excell Seltzer, MD;  Location: [facility];  Service: General;  Laterality: N/A;  . LAPAROSCOPIC GASTRIC BANDING  2011  . VENTRAL HERNIA REPAIR N/A 02/20/2015   Procedure: OPEN REPAIR VENTRAL INCISIONAL HERNIA;  Surgeon: Excell Seltzer, MD;  Location: [facility];  Service: General;  Laterality: N/A;    Family History  Problem Relation Age of Onset  . Heart disease Mother   . Heart disease Maternal Aunt   . Cancer Maternal Uncle     pancreatic  . Heart disease Maternal Uncle     Allergies    Allergen Reactions  . Hydromet [Hydrocodone-Homatropine] Itching  . Iohexol       Desc: PT DEVELOPS ITCHING AND HIVES SEVERAL HOURS AFTER INJECTION OF OMNIPAQUE   . Levofloxacin     REACTION: unspecified  . Penicillins     Current Outpatient Prescriptions on File Prior to Visit  Medication Sig Dispense Refill  . cyclobenzaprine (FLEXERIL) 10 MG tablet Take 1 tablet (10 mg total) by mouth 3 (three) times daily as needed for muscle spasms. 30 tablet 0  . ibuprofen (ADVIL,MOTRIN) 600 MG tablet TAKE ONE TABLET BY MOUTH EVERY 6 HOURS AS NEEDED 30 tablet 4  . meclizine (ANTIVERT) 25 MG tablet Take 1 tablet (25 mg total) by mouth 3 (three) times daily as needed for dizziness. 30 tablet 2  . pseudoephedrine-guaifenesin (MUCINEX D) 60-600 MG per tablet Take 1 tablet by mouth every 12 (twelve) hours.    . triamcinolone cream (KENALOG) 0.1 % APPLY TOPICALLY TWICE DAILY AS DIRECTED 30 g 0  . verapamil (CALAN-SR) 240 MG CR tablet TAKE 1 TABLET AT BEDTIME (PHYSICAL SET UP FOR 06/30/16) 90 tablet 1   No current facility-administered medications on file prior to visit.     BP (!) 146/100 (BP Location: Right Arm, Patient Position: Sitting, Cuff  Size: Large)   Pulse 66   Temp 98.6 F (37 C) (Oral)   Ht 6' 1.25" (1.861 m)   Wt (!) 360 lb 4 oz (163.4 kg)   SpO2 98%   BMI 47.21 kg/m     Review of Systems  Constitutional: Positive for unexpected weight change. Negative for activity change, appetite change, chills, fatigue and fever.  HENT: Negative for congestion, dental problem, ear pain, hearing loss, mouth sores, rhinorrhea, sinus pressure, sneezing, tinnitus, trouble swallowing and voice change.   Eyes: Negative for photophobia, pain, redness and visual disturbance.  Respiratory: Positive for cough. Negative for apnea, choking, chest tightness, shortness of breath and wheezing.   Cardiovascular: Negative for chest pain, palpitations and leg swelling.  Gastrointestinal: Negative for abdominal  distention, abdominal pain, anal bleeding, blood in stool, constipation, diarrhea, nausea, rectal pain and vomiting.  Genitourinary: Negative for decreased urine volume, difficulty urinating, discharge, dysuria, flank pain, frequency, genital sores, hematuria, penile swelling, scrotal swelling, testicular pain and urgency.  Musculoskeletal: Negative for arthralgias, back pain, gait problem, joint swelling, myalgias, neck pain and neck stiffness.  Skin: Negative for color change, rash and wound.  Neurological: Positive for weakness. Negative for dizziness, tremors, seizures, syncope, facial asymmetry, speech difficulty, light-headedness, numbness and headaches.  Hematological: Negative for adenopathy. Does not bruise/bleed easily.  Psychiatric/Behavioral: Negative for agitation, behavioral problems, confusion, decreased concentration, dysphoric mood, hallucinations, self-injury and suicidal ideas. The patient is not nervous/anxious.        Objective:   Physical Exam  Constitutional: He appears well-developed and well-nourished.  Repeat blood pressure 140/90 Weight 360  HENT:  Head: Normocephalic and atraumatic.  Right Ear: External ear normal.  Left Ear: External ear normal.  Nose: Nose normal.  Mouth/Throat: Oropharynx is clear and moist.  Low hanging soft palate with pharyngeal crowding  Eyes: Conjunctivae and EOM are normal. Pupils are equal, round, and reactive to light. No scleral icterus.  Neck: Normal range of motion. Neck supple. No JVD present. No thyromegaly present.  Cardiovascular: Regular rhythm, normal heart sounds and intact distal pulses.  Exam reveals no gallop and no friction rub.   No murmur heard. Pulmonary/Chest: Effort normal and breath sounds normal. He exhibits no tenderness.  Abdominal: Soft. Bowel sounds are normal. He exhibits no distension and no mass. There is no tenderness.  Marked obesity with midline surgical scar  Genitourinary: Prostate normal and penis  normal.  Musculoskeletal: Normal range of motion. He exhibits no edema or tenderness.  Lymphadenopathy:    He has no cervical adenopathy.  Neurological: He is alert. He has normal reflexes. No cranial nerve deficit. Coordination normal.  Skin: Skin is warm and dry. No rash noted.  Psychiatric: He has a normal mood and affect. His behavior is normal.          Assessment & Plan:   Preventive health exam Morbid obesity.  Status post LAP-BAND surgery.  The patient will follow-up with his bariatric surgery to consider adjustment High risk of OSA.  Will set up for sleep study Hypertension.  Patient has a chronic cough.  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  Each follow-up.  10 year colonoscopy.  We'll schedule   Follow-up 3 months Home blood pressure monitoring.  Encouraged Low-salt diet and weight loss recommended  Nyoka Cowden

## 2016-06-30 NOTE — Patient Instructions (Signed)
Limit your sodium (Salt) intake  You need to lose weight.  Consider a lower calorie diet and regular exercise.  .  Follow general surgery  Sleep study as discussed  Schedule your colonoscopy to help detect colon cancer.  Please check your blood pressure on a regular basis.  Return in one month for follow-up

## 2016-06-30 NOTE — Progress Notes (Signed)
Pre visit review using our clinic review tool, if applicable. No additional management support is needed unless otherwise documented below in the visit note. 

## 2016-07-07 ENCOUNTER — Encounter: Payer: Self-pay | Admitting: Gastroenterology

## 2016-07-07 ENCOUNTER — Telehealth: Payer: Self-pay | Admitting: Internal Medicine

## 2016-07-07 NOTE — Telephone Encounter (Signed)
error 

## 2016-07-18 ENCOUNTER — Encounter: Payer: Self-pay | Admitting: Adult Health

## 2016-07-18 ENCOUNTER — Ambulatory Visit (INDEPENDENT_AMBULATORY_CARE_PROVIDER_SITE_OTHER): Payer: BLUE CROSS/BLUE SHIELD | Admitting: Adult Health

## 2016-07-18 VITALS — BP 128/70 | Temp 98.3°F | Ht 73.25 in | Wt 368.4 lb

## 2016-07-18 DIAGNOSIS — R300 Dysuria: Secondary | ICD-10-CM

## 2016-07-18 DIAGNOSIS — R600 Localized edema: Secondary | ICD-10-CM

## 2016-07-18 MED ORDER — FUROSEMIDE 20 MG PO TABS: 20.0000 mg | ORAL_TABLET | Freq: Every day | ORAL | 0 refills | Status: DC

## 2016-07-18 MED ORDER — POTASSIUM CHLORIDE ER 10 MEQ PO TBCR: 10.0000 meq | EXTENDED_RELEASE_TABLET | Freq: Every day | ORAL | 0 refills | Status: DC

## 2016-07-18 NOTE — Patient Instructions (Signed)
I have sent in a prescription for Lasix 20 mg, take this daily  I have also sent in a prescription for Potassium Chloride 10 meq, take this daily.   Someone will call you to schedule your appointment

## 2016-07-18 NOTE — Progress Notes (Signed)
Subjective:    Patient ID: Chad Lyons, male    DOB: 08/10/1966, 50 y.o.   MRN: [id number]  HPI  50 year old male who  has a past medical history of Diverticulitis; Hypertension; and Ventral incisional hernia. He is a patient of Dr. Raliegh Ip, who I am seeing today. [name] presents to the office today for worsening lower extremity swelling. He saw his PCP on 06/30/2016 for his annual physical exam. At that time there was noted lower extremity swelling. He also reported a dry cough and Lisinopril-HCTZ was changed to Losartan HCTZ.Chad Lyons He reports that he has been doubling up on his blood pressure medication to help with his lower extremity swelling. He has gained 8 pounds over the last two weeks and about 30 pounds since August.    He also reports a mild burning when he urinates. This has been presents over the last week.   Wt Readings from Last 3 Encounters:  07/18/16 (!) 368 lb 6.4 oz (167.1 kg)  06/30/16 (!) 360 lb 4 oz (163.4 kg)  04/11/16 (!) 339 lb (153.8 kg)     Review of Systems  Constitutional: Positive for unexpected weight change.  Respiratory: Negative.   Cardiovascular: Negative.   Genitourinary: Positive for dysuria and frequency. Negative for difficulty urinating, discharge, hematuria, penile pain, penile swelling, scrotal swelling, testicular pain and urgency.  All other systems reviewed and are negative.  Past Medical History:  Diagnosis Date  . Diverticulitis   . Hypertension   . Ventral incisional hernia     Social History   Social History  . Marital status: Married    Spouse name: N/A  . Number of children: N/A  . Years of education: N/A   Occupational History  . Not on file.   Social History Main Topics  . Smoking status: Former Smoker    Types: Cigars    Quit date: 10/11/2004  . Smokeless tobacco: Never Used  . Alcohol use Yes     Comment: socially  . Drug use: No  . Sexual activity: Not on file     Comment: still smokes cigars   Other Topics Concern    . Not on file   Social History Narrative  . No narrative on file    Past Surgical History:  Procedure Laterality Date  . COLON SURGERY  2006   colon resection for polyps  . HIATAL HERNIA REPAIR  1991  . INSERTION OF MESH N/A 02/20/2015   Procedure: INSERTION OF MESH;  Surgeon: Excell Seltzer, MD;  Location: [facility];  Service: General;  Laterality: N/A;  . LAPAROSCOPIC GASTRIC BANDING  2011  . VENTRAL HERNIA REPAIR N/A 02/20/2015   Procedure: OPEN REPAIR VENTRAL INCISIONAL HERNIA;  Surgeon: Excell Seltzer, MD;  Location: [facility];  Service: General;  Laterality: N/A;    Family History  Problem Relation Age of Onset  . Heart disease Mother   . Heart disease Maternal Aunt   . Cancer Maternal Uncle     pancreatic  . Heart disease Maternal Uncle     Allergies  Allergen Reactions  . Hydromet [Hydrocodone-Homatropine] Itching  . Iohexol       Desc: PT DEVELOPS ITCHING AND HIVES SEVERAL HOURS AFTER INJECTION OF OMNIPAQUE   . Levofloxacin     REACTION: unspecified  . Penicillins     Current Outpatient Prescriptions on File Prior to Visit  Medication Sig Dispense Refill  . ALPRAZolam (XANAX) 0.5 MG tablet Take 1 tablet (0.5 mg total) by mouth daily  as needed. 90 tablet 1  . cyclobenzaprine (FLEXERIL) 10 MG tablet Take 1 tablet (10 mg total) by mouth 3 (three) times daily as needed for muscle spasms. 30 tablet 0  . ibuprofen (ADVIL,MOTRIN) 600 MG tablet TAKE ONE TABLET BY MOUTH EVERY 6 HOURS AS NEEDED 30 tablet 4  . losartan-hydrochlorothiazide (HYZAAR) 100-25 MG tablet Take 1 tablet by mouth daily. 90 tablet 3  . meclizine (ANTIVERT) 25 MG tablet Take 1 tablet (25 mg total) by mouth 3 (three) times daily as needed for dizziness. 30 tablet 2  . pseudoephedrine-guaifenesin (MUCINEX D) 60-600 MG per tablet Take 1 tablet by mouth every 12 (twelve) hours.    . triamcinolone cream (KENALOG) 0.1 % APPLY TOPICALLY TWICE DAILY AS DIRECTED 30 g 0  . verapamil (CALAN-SR) 240 MG CR tablet  TAKE 1 TABLET AT BEDTIME (PHYSICAL SET UP FOR 06/30/16) 90 tablet 1   No current facility-administered medications on file prior to visit.     BP 128/70   Temp 98.3 F (36.8 C) (Oral)   Ht 6' 1.25" (1.861 m)   Wt (!) 368 lb 6.4 oz (167.1 kg)   BMI 48.27 kg/m       Objective:   Physical Exam  Constitutional: He is oriented to person, place, and time. He appears well-developed and well-nourished. No distress.  Cardiovascular: Normal rate, regular rhythm, normal heart sounds and intact distal pulses.  Exam reveals no gallop and no friction rub.   No murmur heard. Pulmonary/Chest: Effort normal and breath sounds normal. No respiratory distress. He has no wheezes. He has no rales. He exhibits no tenderness.  Musculoskeletal: He exhibits edema and tenderness. He exhibits no deformity.  +2 pitting edema in lower extremities. Legs are shiny and tight. No breaks in skin or discharge noted. No redness or warmth. No pain in calfs.   Neurological: He is alert and oriented to person, place, and time.  Skin: Skin is warm and dry. No rash noted. He is not diaphoretic. No erythema. No pallor.  Psychiatric: He has a normal mood and affect. His behavior is normal. Judgment and thought content normal.  Nursing note and vitals reviewed.      Assessment & Plan:  1. Lower extremity edema - Will start on Lasix 20 mg daily. Advised to elevate his legs as much as possible. Will order echo due to significant weight gain.  - furosemide (LASIX) 20 MG tablet; Take 1 tablet (20 mg total) by mouth daily.  Dispense: 20 tablet; Refill: 0 - potassium chloride (K-DUR) 10 MEQ tablet; Take 1 tablet (10 mEq total) by mouth daily.  Dispense: 30 tablet; Refill: 0 - ECHOCARDIOGRAM COMPLETE; Future - Follow up with PCP in one week   2. Dysuria - POC Urinalysis Dipstick + leuks - Culture, Urine - Possibly from Losartan?  Chad Peng, NP

## 2016-07-20 LAB — URINE CULTURE: Organism ID, Bacteria: NO GROWTH

## 2016-07-22 LAB — POCT URINALYSIS DIPSTICK
Bilirubin, UA: NEGATIVE
Blood, UA: NEGATIVE
Glucose, UA: NEGATIVE
Ketones, UA: NEGATIVE
Nitrite, UA: NEGATIVE
Protein, UA: NEGATIVE
Spec Grav, UA: 1.025
Urobilinogen, UA: NEGATIVE
pH, UA: 6

## 2016-08-01 ENCOUNTER — Encounter: Payer: Self-pay | Admitting: Internal Medicine

## 2016-08-01 ENCOUNTER — Ambulatory Visit (INDEPENDENT_AMBULATORY_CARE_PROVIDER_SITE_OTHER): Payer: BLUE CROSS/BLUE SHIELD | Admitting: Internal Medicine

## 2016-08-01 VITALS — BP 148/90 | HR 74 | Temp 97.9°F | Ht 73.5 in | Wt 362.8 lb

## 2016-08-01 DIAGNOSIS — I10 Essential (primary) hypertension: Secondary | ICD-10-CM | POA: Diagnosis not present

## 2016-08-01 DIAGNOSIS — R600 Localized edema: Secondary | ICD-10-CM | POA: Diagnosis not present

## 2016-08-01 MED ORDER — POTASSIUM CHLORIDE ER 10 MEQ PO TBCR: 10.0000 meq | EXTENDED_RELEASE_TABLET | Freq: Every day | ORAL | 2 refills | Status: DC

## 2016-08-01 MED ORDER — FUROSEMIDE 20 MG PO TABS: 20.0000 mg | ORAL_TABLET | Freq: Every day | ORAL | 2 refills | Status: DC

## 2016-08-01 NOTE — Progress Notes (Signed)
Subjective:    Patient ID: Chad Lyons, male    DOB: 1966/01/21, 50 y.o.   MRN: [id number]  HPI  Wt Readings from Last 3 Encounters:  08/01/16 (!) 362 lb 12.8 oz (164.6 kg)  07/18/16 (!) 368 lb 6.4 oz (167.1 kg)  06/30/16 (!) 360 lb 4 oz (163.4 kg)   BP Readings from Last 3 Encounters:  08/01/16 (!) 148/90  07/18/16 128/70  06/30/16 (!) 74/17   50 year old patient who has essential hypertension.  He is seen today in follow-up after being treated with diuretics for peripheral edema.  His job requires considerable travel.  He attempts to adhere to a restricted salt diet but often has little control over his diet. He has lost the 6 pounds since placed on low-dose diuretic therapy.  Generally feels well.  He is scheduled for general surgery follow-up next week  Past Medical History:  Diagnosis Date  . Diverticulitis   . Hypertension   . Ventral incisional hernia      Social History   Social History  . Marital status: Married    Spouse name: N/A  . Number of children: N/A  . Years of education: N/A   Occupational History  . Not on file.   Social History Main Topics  . Smoking status: Former Smoker    Types: Cigars    Quit date: 10/11/2004  . Smokeless tobacco: Never Used  . Alcohol use Yes     Comment: socially  . Drug use: No  . Sexual activity: Not on file     Comment: still smokes cigars   Other Topics Concern  . Not on file   Social History Narrative  . No narrative on file    Past Surgical History:  Procedure Laterality Date  . COLON SURGERY  2006   colon resection for polyps  . HIATAL HERNIA REPAIR  1991  . INSERTION OF MESH N/A 02/20/2015   Procedure: INSERTION OF MESH;  Surgeon: Excell Seltzer, MD;  Location: [facility];  Service: General;  Laterality: N/A;  . LAPAROSCOPIC GASTRIC BANDING  2011  . VENTRAL HERNIA REPAIR N/A 02/20/2015   Procedure: OPEN REPAIR VENTRAL INCISIONAL HERNIA;  Surgeon: Excell Seltzer, MD;  Location: [facility];  Service:  General;  Laterality: N/A;    Family History  Problem Relation Age of Onset  . Heart disease Mother   . Heart disease Maternal Aunt   . Cancer Maternal Uncle     pancreatic  . Heart disease Maternal Uncle     Allergies  Allergen Reactions  . Hydromet [Hydrocodone-Homatropine] Itching  . Iohexol       Desc: PT DEVELOPS ITCHING AND HIVES SEVERAL HOURS AFTER INJECTION OF OMNIPAQUE   . Levofloxacin     REACTION: unspecified  . Penicillins     Current Outpatient Prescriptions on File Prior to Visit  Medication Sig Dispense Refill  . ALPRAZolam (XANAX) 0.5 MG tablet Take 1 tablet (0.5 mg total) by mouth daily as needed. 90 tablet 1  . cyclobenzaprine (FLEXERIL) 10 MG tablet Take 1 tablet (10 mg total) by mouth 3 (three) times daily as needed for muscle spasms. 30 tablet 0  . furosemide (LASIX) 20 MG tablet Take 1 tablet (20 mg total) by mouth daily. 20 tablet 0  . ibuprofen (ADVIL,MOTRIN) 600 MG tablet TAKE ONE TABLET BY MOUTH EVERY 6 HOURS AS NEEDED 30 tablet 4  . losartan-hydrochlorothiazide (HYZAAR) 100-25 MG tablet Take 1 tablet by mouth daily. 90 tablet 3  . meclizine (ANTIVERT)  25 MG tablet Take 1 tablet (25 mg total) by mouth 3 (three) times daily as needed for dizziness. 30 tablet 2  . potassium chloride (K-DUR) 10 MEQ tablet Take 1 tablet (10 mEq total) by mouth daily. 30 tablet 0  . pseudoephedrine-guaifenesin (MUCINEX D) 60-600 MG per tablet Take 1 tablet by mouth every 12 (twelve) hours.    . triamcinolone cream (KENALOG) 0.1 % APPLY TOPICALLY TWICE DAILY AS DIRECTED 30 g 0  . verapamil (CALAN-SR) 240 MG CR tablet TAKE 1 TABLET AT BEDTIME (PHYSICAL SET UP FOR 06/30/16) 90 tablet 1   No current facility-administered medications on file prior to visit.     BP (!) 148/90 (BP Location: Right Arm, Patient Position: Sitting, Cuff Size: Large)   Pulse 74   Temp 97.9 F (36.6 C) (Oral)   Ht 6' 1.5" (1.867 m)   Wt (!) 362 lb 12.8 oz (164.6 kg)   SpO2 94%   BMI 47.22 kg/m       Review of Systems  Constitutional: Positive for unexpected weight change.  Cardiovascular: Positive for leg swelling.       Objective:   Physical Exam  Constitutional: He appears well-developed and well-nourished. No distress.  Repeat blood pressure 120/82  Pulmonary/Chest: Effort normal and breath sounds normal.  Musculoskeletal: He exhibits edema.  Plus 1 ankle and peel edema          Assessment & Plan:   Lower extremity edema improved.  Will continue restricted salt diet and furosemide when necessary Hypertension, stable Morbid obesity.  Follow general surgery  Return here as scheduled in 6 months  Chad Lyons

## 2016-08-01 NOTE — Patient Instructions (Signed)

## 2016-08-01 NOTE — Progress Notes (Signed)
Pre visit review using our clinic review tool, if applicable. No additional management support is needed unless otherwise documented below in the visit note. 

## 2016-08-07 DIAGNOSIS — R1013 Epigastric pain: Secondary | ICD-10-CM | POA: Diagnosis not present

## 2016-08-12 ENCOUNTER — Other Ambulatory Visit: Payer: Self-pay | Admitting: General Surgery

## 2016-08-12 ENCOUNTER — Telehealth: Payer: Self-pay | Admitting: *Deleted

## 2016-08-12 ENCOUNTER — Telehealth: Payer: Self-pay | Admitting: Radiology

## 2016-08-12 DIAGNOSIS — R1013 Epigastric pain: Secondary | ICD-10-CM

## 2016-08-12 NOTE — Telephone Encounter (Signed)
Dr. Silverio Decamp,  This pt is coming in for a PV tomorrow.  His last weight on 08-01-16 was 362 lbs.  If he is still above 350 lbs at his PV, would you like an OV or direct to hospital for his colonoscopy?  Thanks, [employer]

## 2016-08-12 NOTE — Telephone Encounter (Signed)
He doesn't need GI office visit, can be scheduled as direct at [facility] (Avoid inpatient blocks) but please make sure his BP is well controlled before he is scheduled for colonoscopy. He has had high diastolic pressure in the past

## 2016-08-12 NOTE — Telephone Encounter (Signed)
13 hour prep called to [facility]. Called pt and lmom with instructions about his prep with my number if further explanation is needed.

## 2016-08-13 ENCOUNTER — Other Ambulatory Visit: Payer: Self-pay

## 2016-08-13 ENCOUNTER — Ambulatory Visit
Admission: RE | Admit: 2016-08-13 | Discharge: 2016-08-13 | Disposition: A | Discharge status: Home / Self Care | Payer: BLUE CROSS/BLUE SHIELD | Source: Ambulatory Visit | Attending: General Surgery | Admitting: General Surgery

## 2016-08-13 DIAGNOSIS — R1013 Epigastric pain: Secondary | ICD-10-CM

## 2016-08-13 DIAGNOSIS — Z1211 Encounter for screening for malignant neoplasm of colon: Secondary | ICD-10-CM

## 2016-08-13 DIAGNOSIS — R109 Unspecified abdominal pain: Secondary | ICD-10-CM | POA: Diagnosis not present

## 2016-08-13 NOTE — Telephone Encounter (Signed)
I have him scheduled at the [facility] on 10/21/16 at 8:30 am

## 2016-08-13 NOTE — Telephone Encounter (Signed)
Thank you for catching this Kristen. Good job

## 2016-08-19 ENCOUNTER — Ambulatory Visit
Admission: RE | Admit: 2016-08-19 | Discharge: 2016-08-19 | Disposition: A | Discharge status: Home / Self Care | Payer: BLUE CROSS/BLUE SHIELD | Source: Ambulatory Visit | Attending: General Surgery | Admitting: General Surgery

## 2016-08-19 DIAGNOSIS — N281 Cyst of kidney, acquired: Secondary | ICD-10-CM | POA: Diagnosis not present

## 2016-08-19 DIAGNOSIS — R1013 Epigastric pain: Secondary | ICD-10-CM

## 2016-08-19 MED ORDER — IOPAMIDOL (ISOVUE-300) INJECTION 61%: 125.0000 mL | Freq: Once | INTRAVENOUS | Status: DC | PRN

## 2016-08-25 ENCOUNTER — Telehealth (HOSPITAL_COMMUNITY): Payer: Self-pay | Admitting: Radiology

## 2016-08-25 NOTE — Telephone Encounter (Signed)
left message regarding office late opening. We will call the patient tomorrow to reschedule

## 2016-08-26 ENCOUNTER — Other Ambulatory Visit (HOSPITAL_COMMUNITY): Payer: BLUE CROSS/BLUE SHIELD

## 2016-09-02 ENCOUNTER — Encounter: Payer: BLUE CROSS/BLUE SHIELD | Admitting: Gastroenterology

## 2016-09-03 ENCOUNTER — Institutional Professional Consult (permissible substitution): Payer: BLUE CROSS/BLUE SHIELD | Admitting: Pulmonary Disease

## 2016-09-19 DIAGNOSIS — Z9884 Bariatric surgery status: Secondary | ICD-10-CM | POA: Diagnosis not present

## 2016-10-07 ENCOUNTER — Ambulatory Visit (AMBULATORY_SURGERY_CENTER): Payer: Self-pay

## 2016-10-07 VITALS — BP 138/81 | HR 77 | Resp 20 | Ht 73.0 in | Wt 363.0 lb

## 2016-10-07 DIAGNOSIS — Z86010 Personal history of colonic polyps: Secondary | ICD-10-CM

## 2016-10-07 MED ORDER — NA SULFATE-K SULFATE-MG SULF 17.5-3.13-1.6 GM/177ML PO SOLN: 1.0000 | Freq: Once | ORAL | 0 refills | Status: AC

## 2016-10-07 NOTE — Progress Notes (Signed)
No allergies to eggs or soy No diet meds No home oxygen No past problems with anesthesia Sleep apnea testing  Registered for emmi

## 2016-10-15 ENCOUNTER — Other Ambulatory Visit: Payer: Self-pay | Admitting: Internal Medicine

## 2016-10-29 ENCOUNTER — Ambulatory Visit (INDEPENDENT_AMBULATORY_CARE_PROVIDER_SITE_OTHER): Payer: BLUE CROSS/BLUE SHIELD | Admitting: Pulmonary Disease

## 2016-10-29 ENCOUNTER — Encounter: Payer: Self-pay | Admitting: Pulmonary Disease

## 2016-10-29 ENCOUNTER — Encounter (HOSPITAL_COMMUNITY): Payer: Self-pay | Admitting: *Deleted

## 2016-10-29 VITALS — BP 162/108 | HR 70 | Ht 73.0 in | Wt 363.2 lb

## 2016-10-29 DIAGNOSIS — R0683 Snoring: Secondary | ICD-10-CM

## 2016-10-29 DIAGNOSIS — Z6841 Body Mass Index (BMI) 40.0 and over, adult: Secondary | ICD-10-CM

## 2016-10-29 NOTE — Progress Notes (Signed)
   Subjective:    Patient ID: Chad Lyons, male    DOB: Jul 13, 1966, 51 y.o.   MRN: [id number]  HPI    Review of Systems  Constitutional: Negative for fever and unexpected weight change.  HENT: Positive for congestion, postnasal drip, rhinorrhea, sinus pain, sinus pressure and sneezing. Negative for dental problem, ear pain, nosebleeds, sore throat and trouble swallowing.   Eyes: Negative for redness and itching.  Respiratory: Negative for cough, chest tightness, shortness of breath and wheezing.   Cardiovascular: Negative for palpitations and leg swelling.  Gastrointestinal: Negative for nausea and vomiting.  Genitourinary: Negative for dysuria.  Musculoskeletal: Negative for joint swelling.  Skin: Negative for rash.  Neurological: Positive for headaches ( sinus).  Hematological: Does not bruise/bleed easily.  Psychiatric/Behavioral: Negative for dysphoric mood. The patient is not nervous/anxious.        Objective:   Physical Exam        Assessment & Plan:

## 2016-10-29 NOTE — Patient Instructions (Signed)
Will arrange for home sleep study Will call to arrange for follow up after sleep study reviewed  

## 2016-10-29 NOTE — Progress Notes (Signed)
Past Surgical History He  has a past surgical history that includes Laparoscopic gastric banding (2011); Colon surgery (2006); Hiatal hernia repair (1991); Ventral hernia repair (N/A, 02/20/2015); Insertion of mesh (N/A, 02/20/2015); and Wisdom tooth extraction.  Allergies  Allergen Reactions  . Hydromet [Hydrocodone-Homatropine] Itching  . Iohexol       Desc: PT DEVELOPS ITCHING AND HIVES SEVERAL HOURS AFTER INJECTION OF OMNIPAQUE   . Levofloxacin     REACTION: unspecified  . Penicillins     Family History His family history includes Cancer in his maternal uncle; Heart disease in his maternal aunt, maternal uncle, and mother; Rheum arthritis in his maternal grandmother.  Social History He  reports that he has been smoking Cigars.  He has never used smokeless tobacco. He reports that he drinks about 1.8 - 2.4 oz of alcohol per week . He reports that he does not use drugs.  Review of systems Constitutional: Negative for fever and unexpected weight change.  HENT: Positive for congestion, postnasal drip, rhinorrhea, sinus pain, sinus pressure and sneezing. Negative for dental problem, ear pain, nosebleeds, sore throat and trouble swallowing.   Eyes: Negative for redness and itching.  Respiratory: Negative for cough, chest tightness, shortness of breath and wheezing.   Cardiovascular: Negative for palpitations and leg swelling.  Gastrointestinal: Negative for nausea and vomiting.  Genitourinary: Negative for dysuria.  Musculoskeletal: Negative for joint swelling.  Skin: Negative for rash.  Neurological: Positive for headaches ( sinus).  Hematological: Does not bruise/bleed easily.  Psychiatric/Behavioral: Negative for dysphoric mood. The patient is not nervous/anxious.     Current Outpatient Prescriptions on File Prior to Visit  Medication Sig  . ALPRAZolam (XANAX) 0.5 MG tablet Take 1 tablet (0.5 mg total) by mouth daily as needed.  [name] ibuprofen (ADVIL,MOTRIN) 600 MG tablet TAKE ONE  TABLET BY MOUTH EVERY 6 HOURS AS NEEDED  . losartan-hydrochlorothiazide (HYZAAR) 100-25 MG tablet Take 1 tablet by mouth daily.  [name] OVER THE COUNTER MEDICATION Bee Caps daily po  . sertraline (ZOLOFT) 50 MG tablet Take 50 mg by mouth daily.  [name] triamcinolone cream (KENALOG) 0.1 % APPLY TOPICALLY TWICE A DAY AS DIRECTED  . verapamil (CALAN-SR) 240 MG CR tablet TAKE 1 TABLET AT BEDTIME (PHYSICAL SET UP FOR 06/30/16)   No current facility-administered medications on file prior to visit.     Chief Complaint  Patient presents with  . SLEEP CONSULT    Referred by Dr Burnice Logan. Epworth Score: 10    Past medical history He  has a past medical history of Diverticulitis; GERD (gastroesophageal reflux disease); Heart murmur; Hypertension; and Ventral incisional hernia.  Vital signs BP (!) 162/108 (BP Location: Right Arm, Cuff Size: Normal) Comment: checked multiple times in left arm as well  Pulse 70   Ht 6\' 1"  (1.854 m)   Wt (!) 363 lb 3.2 oz (164.7 kg)   SpO2 98%   BMI 47.92 kg/m   History of Present Illness Chad Lyons is a 51 y.o. male for evaluation of sleep problems.  He has been concerned about his sleep for a while.  He snores, and his wife says he stops breathing while asleep.  He can't sleep in his bed and lay flat.  He is a restless sleeper, and has to sleep in a recliner.  He gets tired if he goes on long car drives.  He will sometimes take a nap during the day.  He goes to sleep at 10 pm.  He falls asleep after 30 minutes.  He wakes up several times to shift position.  He gets out of bed at 6 am.  He feels tired in the morning.  He denies morning headache.  He does not use anything to help him fall sleep or stay awake.  His mouth gets dry at night, and he has to keep a cup of water by his bed.  He doesn't dream as much as he used to.  He denies sleep walking, sleep talking, bruxism, or nightmares.  There is no history of restless legs.  He denies sleep hallucinations, sleep  paralysis, or cataplexy.  The Epworth score is 10 out of 24.   Physical Exam:  General - No distress ENT - No sinus tenderness, no oral exudate, no LAN, no thyromegaly, TM clear, pupils equal/reactive, enlarged tongue, scalloped tongue, MP 4 Cardiac - s1s2 regular, no murmur, pulses symmetric Chest - No wheeze/rales/dullness, good air entry, normal respiratory excursion Back - No focal tenderness Abd - Soft, non-tender, no organomegaly, + bowel sounds Ext - No edema Neuro - Normal strength, cranial nerves intact Skin - No rashes Psych - Normal mood, and behavior  Discussion: He has snoring, sleep disruption, witnessed apnea and daytime sleepiness.  His BMI is > 35.  He has history of HTN on multiple medications.  I am concerned he could have sleep apnea.  We discussed how sleep apnea can affect various health problems, including risks for hypertension, cardiovascular disease, and diabetes.  We also discussed how sleep disruption can increase risks for accidents, such as while driving.  Weight loss as a means of improving sleep apnea was also reviewed.  Additional treatment options discussed were CPAP therapy, oral appliance, and surgical intervention.   Assessment/plan:  Snoring with concern for obstructive sleep apnea. - will arrange for home sleep study  Obesity. - discussed importance of weight loss   Patient Instructions  Will arrange for home sleep study Will call to arrange for follow up after sleep study reviewed     Chesley Mires, M.D. Pager [phone] 10/29/2016, 10:12 AM

## 2016-10-30 NOTE — Anesthesia Preprocedure Evaluation (Addendum)
Anesthesia Evaluation  Patient identified by MRN, date of birth, ID band Patient awake    Reviewed: Allergy & Precautions, NPO status , Patient's Chart, lab work & pertinent test results  Airway Mallampati: III  TM Distance: >3 FB Neck ROM: Full    Dental  (+) Dental Advisory Given   Pulmonary Current Smoker, former smoker,    breath sounds clear to auscultation       Cardiovascular hypertension, Pt. on medications  Rhythm:Regular Rate:Normal  2007 Echo: SUMMARY - Overall left ventricular systolic function was hyperdynamic. Left    ventricular ejection fraction was estimated , range being 70    % to 75 %. There was no diagnostic evidence of left    ventricular regional wall motion abnormalities. Left    ventricular wall thickness was moderately increased. There    was an increased relative contribution of atrial contraction    to left ventricular filling consistent with grade 1 diastolic    dysfunction. - The left atrium was mildly dilated.   Neuro/Psych PSYCHIATRIC DISORDERS Anxiety negative neurological ROS     GI/Hepatic negative GI ROS, Neg liver ROS,   Endo/Other  Morbid obesity  Renal/GU negative Renal ROS  negative genitourinary   Musculoskeletal negative musculoskeletal ROS (+)   Abdominal   Peds negative pediatric ROS (+)  Hematology negative hematology ROS (+)   Anesthesia Other Findings   Reproductive/Obstetrics negative OB ROS                           Anesthesia Physical  Anesthesia Plan  ASA: III  Anesthesia Plan: MAC   Post-op Pain Management:    Induction: Intravenous  Airway Management Planned: Natural Airway and Simple Face Mask  Additional Equipment:   Intra-op Plan:   Post-operative Plan:   Informed Consent: I have reviewed the patients History and Physical, chart, labs and discussed the procedure including the risks,  benefits and alternatives for the proposed anesthesia with the patient or authorized representative who has indicated his/her understanding and acceptance.     Plan Discussed with: CRNA  Anesthesia Plan Comments:       Anesthesia Quick Evaluation

## 2016-10-31 ENCOUNTER — Ambulatory Visit (HOSPITAL_COMMUNITY)
Admission: RE | Admit: 2016-10-31 | Discharge: 2016-10-31 | Disposition: A | Discharge status: Home / Self Care | Payer: BLUE CROSS/BLUE SHIELD | Source: Ambulatory Visit | Attending: Gastroenterology | Admitting: Gastroenterology

## 2016-10-31 ENCOUNTER — Encounter (HOSPITAL_COMMUNITY): Payer: Self-pay | Admitting: *Deleted

## 2016-10-31 ENCOUNTER — Ambulatory Visit (HOSPITAL_COMMUNITY): Payer: BLUE CROSS/BLUE SHIELD | Admitting: Anesthesiology

## 2016-10-31 ENCOUNTER — Encounter (HOSPITAL_COMMUNITY)
Admission: RE | Disposition: A | Discharge status: Home / Self Care | Payer: Self-pay | Source: Ambulatory Visit | Attending: Gastroenterology

## 2016-10-31 DIAGNOSIS — F17290 Nicotine dependence, other tobacco product, uncomplicated: Secondary | ICD-10-CM | POA: Insufficient documentation

## 2016-10-31 DIAGNOSIS — I444 Left anterior fascicular block: Secondary | ICD-10-CM | POA: Insufficient documentation

## 2016-10-31 DIAGNOSIS — K5730 Diverticulosis of large intestine without perforation or abscess without bleeding: Secondary | ICD-10-CM | POA: Insufficient documentation

## 2016-10-31 DIAGNOSIS — Z888 Allergy status to other drugs, medicaments and biological substances status: Secondary | ICD-10-CM | POA: Insufficient documentation

## 2016-10-31 DIAGNOSIS — Z880 Allergy status to penicillin: Secondary | ICD-10-CM | POA: Insufficient documentation

## 2016-10-31 DIAGNOSIS — F419 Anxiety disorder, unspecified: Secondary | ICD-10-CM | POA: Diagnosis not present

## 2016-10-31 DIAGNOSIS — Z79899 Other long term (current) drug therapy: Secondary | ICD-10-CM | POA: Insufficient documentation

## 2016-10-31 DIAGNOSIS — Z1212 Encounter for screening for malignant neoplasm of rectum: Secondary | ICD-10-CM | POA: Diagnosis not present

## 2016-10-31 DIAGNOSIS — K219 Gastro-esophageal reflux disease without esophagitis: Secondary | ICD-10-CM | POA: Diagnosis not present

## 2016-10-31 DIAGNOSIS — Z7982 Long term (current) use of aspirin: Secondary | ICD-10-CM | POA: Insufficient documentation

## 2016-10-31 DIAGNOSIS — I10 Essential (primary) hypertension: Secondary | ICD-10-CM | POA: Diagnosis not present

## 2016-10-31 DIAGNOSIS — Z800 Family history of malignant neoplasm of digestive organs: Secondary | ICD-10-CM | POA: Diagnosis not present

## 2016-10-31 DIAGNOSIS — D122 Benign neoplasm of ascending colon: Secondary | ICD-10-CM | POA: Insufficient documentation

## 2016-10-31 DIAGNOSIS — K635 Polyp of colon: Secondary | ICD-10-CM | POA: Diagnosis not present

## 2016-10-31 DIAGNOSIS — F17210 Nicotine dependence, cigarettes, uncomplicated: Secondary | ICD-10-CM | POA: Insufficient documentation

## 2016-10-31 DIAGNOSIS — Z980 Intestinal bypass and anastomosis status: Secondary | ICD-10-CM | POA: Diagnosis not present

## 2016-10-31 DIAGNOSIS — Z8261 Family history of arthritis: Secondary | ICD-10-CM | POA: Insufficient documentation

## 2016-10-31 DIAGNOSIS — Z8249 Family history of ischemic heart disease and other diseases of the circulatory system: Secondary | ICD-10-CM | POA: Diagnosis not present

## 2016-10-31 DIAGNOSIS — K621 Rectal polyp: Secondary | ICD-10-CM

## 2016-10-31 DIAGNOSIS — Z6841 Body Mass Index (BMI) 40.0 and over, adult: Secondary | ICD-10-CM | POA: Insufficient documentation

## 2016-10-31 DIAGNOSIS — D127 Benign neoplasm of rectosigmoid junction: Secondary | ICD-10-CM | POA: Insufficient documentation

## 2016-10-31 DIAGNOSIS — Z1211 Encounter for screening for malignant neoplasm of colon: Secondary | ICD-10-CM | POA: Diagnosis not present

## 2016-10-31 HISTORY — DX: Abnormal electrocardiogram (ECG) (EKG): R94.31

## 2016-10-31 HISTORY — PX: COLONOSCOPY WITH PROPOFOL: SHX5780

## 2016-10-31 SURGERY — COLONOSCOPY WITH PROPOFOL
Anesthesia: Monitor Anesthesia Care

## 2016-10-31 MED ORDER — PROPOFOL 10 MG/ML IV BOLUS
INTRAVENOUS | Status: AC
  Filled 2016-10-31: qty 40

## 2016-10-31 MED ORDER — LACTATED RINGERS IV SOLN
INTRAVENOUS | Status: DC
  Administered 2016-10-31: 08:00:00 via INTRAVENOUS

## 2016-10-31 MED ORDER — PROPOFOL 10 MG/ML IV BOLUS
INTRAVENOUS | Status: DC | PRN
  Administered 2016-10-31: 20 mg via INTRAVENOUS
  Administered 2016-10-31: 10 mg via INTRAVENOUS
  Administered 2016-10-31: 20 mg via INTRAVENOUS
  Administered 2016-10-31 (×2): 10 mg via INTRAVENOUS
  Administered 2016-10-31: 20 mg via INTRAVENOUS
  Administered 2016-10-31: 40 mg via INTRAVENOUS
  Administered 2016-10-31: 10 mg via INTRAVENOUS
  Administered 2016-10-31 (×2): 40 mg via INTRAVENOUS

## 2016-10-31 MED ORDER — SODIUM CHLORIDE 0.9 % IV SOLN: INTRAVENOUS | Status: DC

## 2016-10-31 SURGICAL SUPPLY — 21 items

## 2016-10-31 NOTE — Discharge Instructions (Signed)
YOU HAD AN ENDOSCOPIC PROCEDURE TODAY: Refer to the procedure report and other information in the discharge instructions given to you for any specific questions about what was found during the examination. If this information does not answer your questions, please call [facility] office at [phone] to clarify.  ° °YOU SHOULD EXPECT: Some feelings of bloating in the abdomen. Passage of more gas than usual. Walking can help get rid of the air that was put into your GI tract during the procedure and reduce the bloating. If you had a lower endoscopy (such as a colonoscopy or flexible sigmoidoscopy) you may notice spotting of blood in your stool or on the toilet paper. Some abdominal soreness may be present for a day or two, also. ° °DIET: Your first meal following the procedure should be a light meal and then it is ok to progress to your normal diet. A half-sandwich or bowl of soup is an example of a good first meal. Heavy or fried foods are harder to digest and may make you feel nauseous or bloated. Drink plenty of fluids but you should avoid alcoholic beverages for 24 hours. If you had a esophageal dilation, please see attached instructions for diet.   ° °ACTIVITY: Your care partner should take you home directly after the procedure. You should plan to take it easy, moving slowly for the rest of the day. You can resume normal activity the day after the procedure however YOU SHOULD NOT DRIVE, use power tools, machinery or perform tasks that involve climbing or major physical exertion for 24 hours (because of the sedation medicines used during the test).  ° °SYMPTOMS TO REPORT IMMEDIATELY: °A gastroenterologist can be reached at any hour. Please call [phone]  for any of the following symptoms:  °Following lower endoscopy (colonoscopy, flexible sigmoidoscopy) °Excessive amounts of blood in the stool  °Significant tenderness, worsening of abdominal pains  °Swelling of the abdomen that is new, acute  °Fever of 100° or  higher  °Following upper endoscopy (EGD, EUS, ERCP, esophageal dilation) °Vomiting of blood or coffee ground material  °New, significant abdominal pain  °New, significant chest pain or pain under the shoulder blades  °Painful or persistently difficult swallowing  °New shortness of breath  °Black, tarry-looking or red, bloody stools ° °FOLLOW UP:  °If any biopsies were taken you will be contacted by phone or by letter within the next 1-3 weeks. Call [phone]  if you have not heard about the biopsies in 3 weeks.  °Please also call with any specific questions about appointments or follow up tests. ° °

## 2016-10-31 NOTE — H&P (Addendum)
[facility] History and Physical   Primary Care Physician:  Nyoka Cowden, MD   Reason for Procedure:  Screening for colorectal cancer  Plan:    Colonoscopy with possible intervention    HPI: Chad Lyons is a 51 y.o. male with h/o perforated diverticulitis s/p resection, gastric lap band, ventral hernia repair here for screening colonoscopy. Denies any nausea, vomiting, abdominal pain, melena or bright red blood per rectum    Past Medical History:  Diagnosis Date  . Diverticulitis    HX OF  . EKG, abnormal    HX OF LEFT ANTERIO FASCICULAR BLOCK ON 02-15-15 EKG  . GERD (gastroesophageal reflux disease)   . Heart murmur    NOW RESOLVED  . Hypertension   . Ventral incisional hernia     Past Surgical History:  Procedure Laterality Date  . COLON SURGERY  2006   colon resection for polyps  . HIATAL HERNIA REPAIR  1991  . INSERTION OF MESH N/A 02/20/2015   Procedure: INSERTION OF MESH;  Surgeon: Excell Seltzer, MD;  Location: [facility];  Service: General;  Laterality: N/A;  . LAPAROSCOPIC GASTRIC BANDING  2011  . VENTRAL HERNIA REPAIR N/A 02/20/2015   Procedure: OPEN REPAIR VENTRAL INCISIONAL HERNIA;  Surgeon: Excell Seltzer, MD;  Location: [facility];  Service: General;  Laterality: N/A;  . WISDOM TOOTH EXTRACTION      Prior to Admission medications   Medication Sig Start Date End Date Taking? Authorizing Provider  ALPRAZolam Duanne Moron) 0.5 MG tablet Take 1 tablet (0.5 mg total) by mouth daily as needed. Patient taking differently: Take 0.5 mg by mouth daily as needed for anxiety.  06/30/16  Yes Marletta Lor, MD  aspirin-acetaminophen-caffeine (EXCEDRIN MIGRAINE) [id number] MG tablet Take 2 tablets by mouth 3 (three) times daily as needed (sinus headaches).   Yes Historical Provider, MD  losartan-hydrochlorothiazide (HYZAAR) 100-25 MG tablet Take 1 tablet by mouth daily. 06/30/16  Yes Marletta Lor, MD  OVER THE COUNTER MEDICATION Take 1  tablet by mouth daily. Bee Caps   Yes Historical Provider, MD  sertraline (ZOLOFT) 50 MG tablet Take 50 mg by mouth daily.   Yes Historical Provider, MD  triamcinolone cream (KENALOG) 0.1 % APPLY TOPICALLY TWICE A DAY AS DIRECTED Patient taking differently: Apply topically once daily as needed for itching 10/16/16  Yes Marletta Lor, MD  verapamil (CALAN-SR) 240 MG CR tablet TAKE 1 TABLET AT BEDTIME (PHYSICAL SET UP FOR 06/30/16) 06/27/16  Yes Marletta Lor, MD    Current Facility-Administered Medications  Medication Dose Route Frequency Provider Last Rate Last Dose  . 0.9 %  sodium chloride infusion   Intravenous Continuous Kavitha Nandigam V, MD      . lactated ringers infusion   Intravenous Continuous Mauri Pole, MD        Allergies as of 08/13/2016 - Review Complete 08/01/2016  Allergen Reaction Noted  . Hydromet [hydrocodone-homatropine] Itching 08/23/2012  . Iohexol  09/23/2005  . Levofloxacin  10/08/2006  . Penicillins  10/11/2010    Family History  Problem Relation Age of Onset  . Heart disease Mother   . Heart disease Maternal Aunt   . Cancer Maternal Uncle     pancreatic  . Heart disease Maternal Uncle   . Rheum arthritis Maternal Grandmother   . Colon cancer Neg Hx     Social History   Social History  . Marital status: Married    Spouse name: N/A  . Number of children: N/A  .  Years of education: N/A   Occupational History  . Sales    Social History Main Topics  . Smoking status: Current Every Day Smoker    Types: Cigars, Cigarettes    Last attempt to quit: 10/11/2004  . Smokeless tobacco: Never Used     Comment: Current Cigars 1 every other day -----QUIT cigarettes in 2006>> 1/2PPD x 15 years  . Alcohol use 1.8 - 2.4 oz/week    3 - 4 Shots of liquor per week     Comment: socially;  . Drug use: No  . Sexual activity: Not on file     Comment: still smokes cigars   Other Topics Concern  . Not on file   Social History Narrative  . No  narrative on file    Review of Systems:  All other review of systems negative except as mentioned in the HPI.  Physical Exam: Vital signs in last 24 hours: Temp:  [98.2 F (36.8 C)] 98.2 F (36.8 C) (03/16 0733) Pulse Rate:  [70] 70 (03/16 0733) Resp:  [11] 11 (03/16 0733) BP: (154)/(98) 154/98 (03/16 0733) SpO2:  [99 %] 99 % (03/16 0733) Weight:  [363 lb (164.7 kg)] 363 lb (164.7 kg) (03/16 0733)   General:   Alert,  Well-developed, well-nourished, pleasant and cooperative in NAD. Morbidly obese Lungs:  Clear throughout to auscultation.   Heart:  Regular rate and rhythm; no murmurs, clicks, rubs,  or gallops. Abdomen:  Soft, nontender and nondistended. Normal bowel sounds.   Neuro/Psych:  Alert and cooperative. Normal mood and affect. A and O x 3   @K .Denzil Magnuson, MD [phone] Mon-Fri 8a-5p [phone] after 5p, weekends, holidays 10/31/2016 8:28 AM@

## 2016-10-31 NOTE — Anesthesia Postprocedure Evaluation (Signed)
Anesthesia Post Note  Patient: Chad Lyons  Procedure(s) Performed: Procedure(s) (LRB): COLONOSCOPY WITH PROPOFOL (N/A)  Patient location during evaluation: PACU Anesthesia Type: MAC Level of consciousness: awake and alert Pain management: pain level controlled Vital Signs Assessment: post-procedure vital signs reviewed and stable Respiratory status: spontaneous breathing, nonlabored ventilation, respiratory function stable and patient connected to nasal cannula oxygen Cardiovascular status: stable and blood pressure returned to baseline Anesthetic complications: no       Last Vitals:  Vitals:   10/31/16 0920 10/31/16 0930  BP: (!) 159/82 (!) 157/87  Pulse: (!) 59 64  Resp: 16 16  Temp:      Last Pain:  Vitals:   10/31/16 0910  TempSrc: Leonard Schwartz

## 2016-10-31 NOTE — Transfer of Care (Signed)
Immediate Anesthesia Transfer of Care Note  Patient: Chad Lyons  Procedure(s) Performed: Procedure(s): COLONOSCOPY WITH PROPOFOL (N/A)  Patient Location: PACU and Endoscopy Unit  Anesthesia Type:MAC  Level of Consciousness: awake, alert , oriented and patient cooperative  Airway & Oxygen Therapy: Patient Spontanous Breathing and Patient connected to face mask oxygen  Post-op Assessment: Report given to RN, Post -op Vital signs reviewed and stable and Patient moving all extremities  Post vital signs: Reviewed and stable  Last Vitals:  Vitals:   10/31/16 0733  BP: (!) 154/98  Pulse: 70  Resp: 11  Temp: 36.8 C    Last Pain:  Vitals:   10/31/16 0733  TempSrc: Oral         Complications: No apparent anesthesia complications

## 2016-10-31 NOTE — Op Note (Signed)
[facility] Patient Name: Chad Lyons Procedure Date: 10/31/2016 MRN: [id number] Attending MD: Mauri Pole , MD Date of Birth: [birth year] CSN: [id number] Age: 51 Admit Type: Outpatient Procedure:                Colonoscopy Indications:              Screening for colorectal malignant neoplasm, Last                            colonoscopy: 2007 Providers:                Mauri Pole, MD, Cleda Daub, RN, Elspeth Cho Tech., Technician, Dion Saucier, CRNA Referring MD:              Medicines:                Monitored Anesthesia Care Complications:            No immediate complications. Estimated Blood Loss:     Estimated blood loss was minimal. Procedure:                Pre-Anesthesia Assessment:                           - Prior to the procedure, a History and Physical                            was performed, and patient medications and                            allergies were reviewed. The patient's tolerance of                            previous anesthesia was also reviewed. The risks                            and benefits of the procedure and the sedation                            options and risks were discussed with the patient.                            All questions were answered, and informed consent                            was obtained. Prior Anticoagulants: The patient has                            taken no previous anticoagulant or antiplatelet                            agents. ASA Grade Assessment: III - A patient with  severe systemic disease. After reviewing the risks                            and benefits, the patient was deemed in                            satisfactory condition to undergo the procedure.                           After obtaining informed consent, the colonoscope                            was passed under direct vision. Throughout the        procedure, the patient's blood pressure, pulse, and                            oxygen saturations were monitored continuously. The                            EC-[id number] ([id number]) scope was introduced through                            the anus and advanced to the the terminal ileum,                            with identification of the appendiceal orifice and                            IC valve. The colonoscopy was performed without                            difficulty. The patient tolerated the procedure                            well. The quality of the bowel preparation was                            adequate. The terminal ileum, ileocecal valve,                            appendiceal orifice, and rectum were photographed. Scope In: 8:41:09 AM Scope Out: 9:02:19 AM Scope Withdrawal Time: 0 hours 18 minutes 13 seconds  Total Procedure Duration: 0 hours 21 minutes 10 seconds  Findings:      The perianal and digital rectal examinations were normal.      Two sessile polyps were found in the recto-sigmoid colon and ascending       colon. The polyps were 4 to 7 mm in size. These polyps were removed with       a cold snare. Resection and retrieval were complete.      Multiple small and large-mouthed diverticula were found in the       descending colon, transverse colon and ascending colon.      There was evidence of a prior functional end-to-end colo-colonic       anastomosis in  the sigmoid colon. This was patent. The anastomosis was       traversed.      The exam was otherwise without abnormality. Impression:               - Two 4 to 7 mm polyps at the recto-sigmoid colon                            and in the ascending colon, removed with a cold                            snare. Resected and retrieved.                           - Diverticulosis in the descending colon, in the                            transverse colon and in the ascending colon.                           - Patent  functional end-to-end colo-colonic                            anastomosis.                           - The examination was otherwise normal. Moderate Sedation:      N/A- Per Anesthesia Care Recommendation:           - Patient has a contact number available for                            emergencies. The signs and symptoms of potential                            delayed complications were discussed with the                            patient. Return to normal activities tomorrow.                            Written discharge instructions were provided to the                            patient.                           - Resume previous diet.                           - Continue present medications.                           - No aspirin, ibuprofen, naproxen, or other                            non-steroidal anti-inflammatory drugs for 2 weeks.                           -  Await pathology results.                           - Repeat colonoscopy in 5-10 years for surveillance                            based on pathology results. Procedure Code(s):        --- Professional ---                           [phone], Colonoscopy, flexible; with removal of                            tumor(s), polyp(s), or other lesion(s) by snare                            technique Diagnosis Code(s):        --- Professional ---                           Z12.11, Encounter for screening for malignant                            neoplasm of colon                           D12.7, Benign neoplasm of rectosigmoid junction                           D12.2, Benign neoplasm of ascending colon                           Z98.0, Intestinal bypass and anastomosis status                           K57.30, Diverticulosis of large intestine without                            perforation or abscess without bleeding CPT copyright 2016 American Medical Association. All rights reserved. The codes documented in this report are preliminary  and upon coder review may  be revised to meet current compliance requirements. Mauri Pole, MD 10/31/2016 9:19:15 AM This report has been signed electronically. Number of Addenda: 0

## 2016-11-03 ENCOUNTER — Encounter: Payer: Self-pay | Admitting: Gastroenterology

## 2016-11-03 ENCOUNTER — Encounter (HOSPITAL_COMMUNITY): Payer: Self-pay | Admitting: Gastroenterology

## 2016-11-25 DIAGNOSIS — G4733 Obstructive sleep apnea (adult) (pediatric): Secondary | ICD-10-CM | POA: Diagnosis not present

## 2016-11-27 ENCOUNTER — Telehealth: Payer: Self-pay | Admitting: Pulmonary Disease

## 2016-11-27 ENCOUNTER — Other Ambulatory Visit: Payer: Self-pay | Admitting: *Deleted

## 2016-11-27 ENCOUNTER — Encounter: Payer: Self-pay | Admitting: Pulmonary Disease

## 2016-11-27 DIAGNOSIS — G4733 Obstructive sleep apnea (adult) (pediatric): Secondary | ICD-10-CM

## 2016-11-27 DIAGNOSIS — R0683 Snoring: Secondary | ICD-10-CM

## 2016-11-27 HISTORY — DX: Obstructive sleep apnea (adult) (pediatric): G47.33

## 2016-11-27 NOTE — Telephone Encounter (Signed)
HST 11/25/16 >> AHI 54.9, SaO2 low 68%  Will have my nurse inform pt that sleep study shows severe sleep apnea.  Options are 1) CPAP now, 2) ROV first.  If He is agreeable to CPAP, then please send order for auto CPAP range 5 to 15 cm H2O with heated humidity and mask of choice.  Have download sent 1 month after starting CPAP and set up ROV 2 months after starting CPAP.  ROV can be with me or NP.

## 2016-12-03 NOTE — Telephone Encounter (Signed)
Mychart message sent to patient with results Phone lines down 12/02/16 - 12/03/16 Will await response from patient to order CPAP and schedule 84mo ROV

## 2016-12-04 NOTE — Telephone Encounter (Signed)
Received message from patient stating to proceed with CPAP.  Order placed for CPAP start.  Mychart message sent back to the patient making aware and tell ing him to contact our office as soon as his CPAP is set up to schedule a 2 mo ROV with VS. Nothing further needed.

## 2016-12-23 DIAGNOSIS — G4733 Obstructive sleep apnea (adult) (pediatric): Secondary | ICD-10-CM | POA: Diagnosis not present

## 2016-12-24 ENCOUNTER — Other Ambulatory Visit: Payer: Self-pay | Admitting: Internal Medicine

## 2017-01-01 ENCOUNTER — Other Ambulatory Visit: Payer: Self-pay | Admitting: Internal Medicine

## 2017-01-09 ENCOUNTER — Ambulatory Visit (INDEPENDENT_AMBULATORY_CARE_PROVIDER_SITE_OTHER): Payer: BLUE CROSS/BLUE SHIELD | Admitting: Family Medicine

## 2017-01-09 ENCOUNTER — Encounter: Payer: Self-pay | Admitting: Family Medicine

## 2017-01-09 VITALS — BP 122/84 | Temp 98.4°F | Ht 73.0 in | Wt 351.0 lb

## 2017-01-09 DIAGNOSIS — J209 Acute bronchitis, unspecified: Secondary | ICD-10-CM | POA: Diagnosis not present

## 2017-01-09 MED ORDER — AZITHROMYCIN 250 MG PO TABS: ORAL_TABLET | ORAL | 0 refills | Status: DC

## 2017-01-09 NOTE — Patient Instructions (Signed)
WE NOW OFFER   [facility] FAST TRACK!!!  SAME DAY Appointments for ACUTE CARE  Such as: Sprains, Injuries, cuts, abrasions, rashes, muscle pain, joint pain, back pain Colds, flu, sore throats, headache, allergies, cough, fever  Ear pain, sinus and eye infections Abdominal pain, nausea, vomiting, diarrhea, upset stomach Animal/insect bites  3 Easy Ways to Schedule: Walk-In Scheduling Call in scheduling Mychart Sign-up: [email or web address]         

## 2017-01-09 NOTE — Progress Notes (Signed)
   Subjective:    Patient ID: Chad Lyons, male    DOB: 09/20/1965, 51 y.o.   MRN: [id number]  HPI Here for 3 weeks of chest tightness and coughing up green sputum. No fever. On Nyquil and Mucinex.    Review of Systems  Constitutional: Negative.   HENT: Negative.   Eyes: Negative.   Respiratory: Positive for cough and chest tightness.   Cardiovascular: Negative.        Objective:   Physical Exam  Constitutional: He appears well-developed and well-nourished.  HENT:  Right Ear: External ear normal.  Left Ear: External ear normal.  Nose: Nose normal.  Mouth/Throat: Oropharynx is clear and moist.  Eyes: Conjunctivae are normal.  Neck: No thyromegaly present.  Pulmonary/Chest: Effort normal. No respiratory distress. He has no wheezes. He has no rales.  Scattered rhonchi   Lymphadenopathy:    He has no cervical adenopathy.          Assessment & Plan:  Bronchitis, treat with a Zpack. Alysia Penna, MD

## 2017-01-23 DIAGNOSIS — G4733 Obstructive sleep apnea (adult) (pediatric): Secondary | ICD-10-CM | POA: Diagnosis not present

## 2017-02-06 DIAGNOSIS — M76821 Posterior tibial tendinitis, right leg: Secondary | ICD-10-CM | POA: Diagnosis not present

## 2017-02-06 DIAGNOSIS — M71571 Other bursitis, not elsewhere classified, right ankle and foot: Secondary | ICD-10-CM | POA: Diagnosis not present

## 2017-02-06 DIAGNOSIS — M7731 Calcaneal spur, right foot: Secondary | ICD-10-CM | POA: Diagnosis not present

## 2017-02-06 DIAGNOSIS — M71572 Other bursitis, not elsewhere classified, left ankle and foot: Secondary | ICD-10-CM | POA: Diagnosis not present

## 2017-02-06 DIAGNOSIS — M7732 Calcaneal spur, left foot: Secondary | ICD-10-CM | POA: Diagnosis not present

## 2017-02-06 DIAGNOSIS — M722 Plantar fascial fibromatosis: Secondary | ICD-10-CM | POA: Diagnosis not present

## 2017-02-06 DIAGNOSIS — M76822 Posterior tibial tendinitis, left leg: Secondary | ICD-10-CM | POA: Diagnosis not present

## 2017-02-13 DIAGNOSIS — M722 Plantar fascial fibromatosis: Secondary | ICD-10-CM | POA: Diagnosis not present

## 2017-02-13 DIAGNOSIS — M71571 Other bursitis, not elsewhere classified, right ankle and foot: Secondary | ICD-10-CM | POA: Diagnosis not present

## 2017-02-13 DIAGNOSIS — M71572 Other bursitis, not elsewhere classified, left ankle and foot: Secondary | ICD-10-CM | POA: Diagnosis not present

## 2017-02-21 ENCOUNTER — Encounter (HOSPITAL_BASED_OUTPATIENT_CLINIC_OR_DEPARTMENT_OTHER): Payer: Self-pay | Admitting: Emergency Medicine

## 2017-02-21 ENCOUNTER — Emergency Department (HOSPITAL_BASED_OUTPATIENT_CLINIC_OR_DEPARTMENT_OTHER): Payer: BLUE CROSS/BLUE SHIELD

## 2017-02-21 ENCOUNTER — Inpatient Hospital Stay (HOSPITAL_BASED_OUTPATIENT_CLINIC_OR_DEPARTMENT_OTHER)
Admission: EM | Admit: 2017-02-21 | Discharge: 2017-02-24 | DRG: 292 | Disposition: A | Discharge status: Home / Self Care | Payer: BLUE CROSS/BLUE SHIELD | Attending: Internal Medicine | Admitting: Internal Medicine

## 2017-02-21 DIAGNOSIS — K219 Gastro-esophageal reflux disease without esophagitis: Secondary | ICD-10-CM | POA: Diagnosis present

## 2017-02-21 DIAGNOSIS — I421 Obstructive hypertrophic cardiomyopathy: Secondary | ICD-10-CM | POA: Diagnosis present

## 2017-02-21 DIAGNOSIS — Z79899 Other long term (current) drug therapy: Secondary | ICD-10-CM

## 2017-02-21 DIAGNOSIS — I272 Pulmonary hypertension, unspecified: Secondary | ICD-10-CM

## 2017-02-21 DIAGNOSIS — R590 Localized enlarged lymph nodes: Secondary | ICD-10-CM | POA: Diagnosis not present

## 2017-02-21 DIAGNOSIS — Z6841 Body Mass Index (BMI) 40.0 and over, adult: Secondary | ICD-10-CM | POA: Diagnosis not present

## 2017-02-21 DIAGNOSIS — I5033 Acute on chronic diastolic (congestive) heart failure: Secondary | ICD-10-CM | POA: Diagnosis not present

## 2017-02-21 DIAGNOSIS — R7989 Other specified abnormal findings of blood chemistry: Secondary | ICD-10-CM | POA: Diagnosis present

## 2017-02-21 DIAGNOSIS — F17290 Nicotine dependence, other tobacco product, uncomplicated: Secondary | ICD-10-CM | POA: Diagnosis present

## 2017-02-21 DIAGNOSIS — E876 Hypokalemia: Secondary | ICD-10-CM | POA: Diagnosis not present

## 2017-02-21 DIAGNOSIS — Z9884 Bariatric surgery status: Secondary | ICD-10-CM | POA: Diagnosis not present

## 2017-02-21 DIAGNOSIS — I517 Cardiomegaly: Secondary | ICD-10-CM

## 2017-02-21 DIAGNOSIS — I509 Heart failure, unspecified: Secondary | ICD-10-CM

## 2017-02-21 DIAGNOSIS — R778 Other specified abnormalities of plasma proteins: Secondary | ICD-10-CM | POA: Diagnosis present

## 2017-02-21 DIAGNOSIS — I10 Essential (primary) hypertension: Secondary | ICD-10-CM | POA: Diagnosis not present

## 2017-02-21 DIAGNOSIS — Z880 Allergy status to penicillin: Secondary | ICD-10-CM

## 2017-02-21 DIAGNOSIS — R0600 Dyspnea, unspecified: Secondary | ICD-10-CM | POA: Diagnosis present

## 2017-02-21 DIAGNOSIS — I5031 Acute diastolic (congestive) heart failure: Secondary | ICD-10-CM | POA: Diagnosis not present

## 2017-02-21 DIAGNOSIS — R600 Localized edema: Secondary | ICD-10-CM | POA: Diagnosis not present

## 2017-02-21 DIAGNOSIS — G4733 Obstructive sleep apnea (adult) (pediatric): Secondary | ICD-10-CM | POA: Diagnosis not present

## 2017-02-21 DIAGNOSIS — I110 Hypertensive heart disease with heart failure: Secondary | ICD-10-CM | POA: Diagnosis not present

## 2017-02-21 DIAGNOSIS — I2721 Secondary pulmonary arterial hypertension: Secondary | ICD-10-CM | POA: Diagnosis not present

## 2017-02-21 DIAGNOSIS — R0602 Shortness of breath: Secondary | ICD-10-CM | POA: Diagnosis not present

## 2017-02-21 DIAGNOSIS — R748 Abnormal levels of other serum enzymes: Secondary | ICD-10-CM | POA: Diagnosis not present

## 2017-02-21 DIAGNOSIS — R945 Abnormal results of liver function studies: Secondary | ICD-10-CM | POA: Diagnosis present

## 2017-02-21 DIAGNOSIS — E66812 Obesity, class 2: Secondary | ICD-10-CM | POA: Diagnosis present

## 2017-02-21 DIAGNOSIS — I340 Nonrheumatic mitral (valve) insufficiency: Secondary | ICD-10-CM | POA: Diagnosis not present

## 2017-02-21 DIAGNOSIS — I248 Other forms of acute ischemic heart disease: Secondary | ICD-10-CM | POA: Diagnosis not present

## 2017-02-21 DIAGNOSIS — R0609 Other forms of dyspnea: Secondary | ICD-10-CM | POA: Diagnosis not present

## 2017-02-21 LAB — COMPREHENSIVE METABOLIC PANEL
ALT: 65 U/L — ABNORMAL HIGH (ref 17–63)
AST: 71 U/L — ABNORMAL HIGH (ref 15–41)
Albumin: 3.9 g/dL (ref 3.5–5.0)
Alkaline Phosphatase: 99 U/L (ref 38–126)
Anion gap: 10 (ref 5–15)
BUN: 16 mg/dL (ref 6–20)
CO2: 27 mmol/L (ref 22–32)
Calcium: 9.5 mg/dL (ref 8.9–10.3)
Chloride: 100 mmol/L — ABNORMAL LOW (ref 101–111)
Creatinine, Ser: 0.98 mg/dL (ref 0.61–1.24)
GFR calc Af Amer: >60 mL/min (ref >60)
GFR calc non Af Amer: >60 mL/min (ref >60)
Glucose, Bld: 102 mg/dL — ABNORMAL HIGH (ref 65–99)
Potassium: 3.6 mmol/L (ref 3.5–5.1)
Sodium: 137 mmol/L (ref 135–145)
Total Bilirubin: 1.4 mg/dL — ABNORMAL HIGH (ref 0.3–1.2)
Total Protein: 7.6 g/dL (ref 6.5–8.1)

## 2017-02-21 LAB — CBC WITH DIFFERENTIAL/PLATELET
Basophils Absolute: 0 10*3/uL (ref 0.0–0.1)
Basophils Relative: 0 %
Eosinophils Absolute: 0 10*3/uL (ref 0.0–0.7)
Eosinophils Relative: 0 %
HCT: 40.4 % (ref 39.0–52.0)
Hemoglobin: 13.7 g/dL (ref 13.0–17.0)
Lymphocytes Relative: 4 %
Lymphs Abs: 0.4 10*3/uL — ABNORMAL LOW (ref 0.7–4.0)
MCH: 30.2 pg (ref 26.0–34.0)
MCHC: 33.9 g/dL (ref 30.0–36.0)
MCV: 89 fL (ref 78.0–100.0)
Monocytes Absolute: 0.5 10*3/uL (ref 0.1–1.0)
Monocytes Relative: 5 %
Neutro Abs: 9.8 10*3/uL — ABNORMAL HIGH (ref 1.7–7.7)
Neutrophils Relative %: 91 %
Platelets: 173 10*3/uL (ref 150–400)
RBC: 4.54 MIL/uL (ref 4.22–5.81)
RDW: 14 % (ref 11.5–15.5)
WBC: 10.8 10*3/uL — ABNORMAL HIGH (ref 4.0–10.5)

## 2017-02-21 LAB — D-DIMER, QUANTITATIVE: D-Dimer, Quant: 0.6 ug/mL-FEU — ABNORMAL HIGH (ref 0.00–0.50)

## 2017-02-21 LAB — TROPONIN I
Troponin I: 0.05 ng/mL (ref <0.03)
Troponin I: 0.05 ng/mL (ref <0.03)

## 2017-02-21 LAB — BRAIN NATRIURETIC PEPTIDE: B Natriuretic Peptide: 550.1 pg/mL — ABNORMAL HIGH (ref 0.0–100.0)

## 2017-02-21 MED ORDER — HYDROCORTISONE NA SUCCINATE PF 250 MG IJ SOLR
200.0000 mg | Freq: Once | INTRAMUSCULAR | Status: AC
  Administered 2017-02-21: 200 mg via INTRAVENOUS
  Filled 2017-02-21: qty 200

## 2017-02-21 MED ORDER — ALBUTEROL SULFATE (2.5 MG/3ML) 0.083% IN NEBU
2.5000 mg | INHALATION_SOLUTION | Freq: Once | RESPIRATORY_TRACT | Status: AC
  Administered 2017-02-21: 2.5 mg via RESPIRATORY_TRACT
  Filled 2017-02-21: qty 3

## 2017-02-21 MED ORDER — ASPIRIN 81 MG PO CHEW
324.0000 mg | CHEWABLE_TABLET | Freq: Once | ORAL | Status: AC
  Administered 2017-02-21: 324 mg via ORAL
  Filled 2017-02-21: qty 4

## 2017-02-21 MED ORDER — IOPAMIDOL (ISOVUE-370) INJECTION 76%
100.0000 mL | Freq: Once | INTRAVENOUS | Status: AC | PRN
  Administered 2017-02-21: 100 mL via INTRAVENOUS

## 2017-02-21 MED ORDER — DIPHENHYDRAMINE HCL 25 MG PO CAPS
50.0000 mg | ORAL_CAPSULE | Freq: Once | ORAL | Status: AC
  Administered 2017-02-21: 50 mg via ORAL
  Filled 2017-02-21: qty 2

## 2017-02-21 MED ORDER — FUROSEMIDE 10 MG/ML IJ SOLN
40.0000 mg | Freq: Once | INTRAMUSCULAR | Status: AC
  Administered 2017-02-21: 40 mg via INTRAVENOUS
  Filled 2017-02-21: qty 4

## 2017-02-21 MED ORDER — DIPHENHYDRAMINE HCL 50 MG/ML IJ SOLN: 50.0000 mg | Freq: Once | INTRAMUSCULAR | Status: AC

## 2017-02-21 MED ORDER — HYDROCORTISONE NA SUCCINATE PF 100 MG IJ SOLR
INTRAMUSCULAR | Status: AC
  Filled 2017-02-21: qty 4

## 2017-02-21 NOTE — ED Provider Notes (Signed)
[place] DEPT [facility] Provider Note   CSN: [id number] Arrival date & time: 02/21/17  1437     History   Chief Complaint Chief Complaint  Patient presents with  . Shortness of Breath    HPI Chad Lyons is a 51 y.o. male.  HPI  51 year old male presents with shortness of breath for 2 days. He states it started 2 days ago and has progressively worsened. He most notices it during walking or other minimal exertion. He has had a little bit of a cough starting yesterday and had black sputum at that time. He denies any chest pain but sometimes when his shorts of breath is bad he will have palpitations. For the past 2 weeks he's been having bilateral feet swelling but this seems actually be improving since he got a cortisone shot in his heels and was put on an anti-inflammatory. No unilateral leg swelling. However he travels frequently for work and is flying multiple times per week. He most recently drove to his corporate office in [place] and back. He denies any prior history of DVT or PE. He's had a little bit of congestion but no rhinorrhea. He's had a subjective fever and has felt hot and cold. Dyspnea is also worse with laying flat.  Past Medical History:  Diagnosis Date  . Diverticulitis    HX OF  . EKG, abnormal    HX OF LEFT ANTERIO FASCICULAR BLOCK ON 02-15-15 EKG  . GERD (gastroesophageal reflux disease)   . Heart murmur    NOW RESOLVED  . Hypertension   . OSA (obstructive sleep apnea) 11/27/2016  . Ventral incisional hernia     Patient Active Problem List   Diagnosis Date Noted  . CHF (congestive heart failure) ([place]) 02/21/2017  . OSA (obstructive sleep apnea) 11/27/2016  . Special screening for malignant neoplasms, colon   . Polyp of ascending colon   . Polyp of rectum   . Ventral incisional hernia 02/20/2015  . Blepharitis of left eye 05/10/2013  . Fitting and adjustment of gastric lap band 12/11/2011  . URI (upper respiratory infection) 10/11/2010  .  ANXIETY 12/27/2009  . MUSCLE STRAIN, HAMSTRING MUSCLE 03/02/2008  . ABDOMINAL MASS, RIGHT LOWER QUADRANT 03/17/2007  . OBESITY, MORBID 01/22/2007  . Essential hypertension 01/22/2007  . 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 DIVERTICULITIS, HX OF 01/22/2007    Past Surgical History:  Procedure Laterality Date  . COLON SURGERY  2006   colon resection for polyps  . COLONOSCOPY WITH PROPOFOL N/A 10/31/2016   Procedure: COLONOSCOPY WITH PROPOFOL;  Surgeon: Mauri Pole, MD;  Location: [facility];  Service: Endoscopy;  Laterality: N/A;  . HIATAL HERNIA REPAIR  1991  . INSERTION OF MESH N/A 02/20/2015   Procedure: INSERTION OF MESH;  Surgeon: Excell Seltzer, MD;  Location: [facility] ORS;  Service: General;  Laterality: N/A;  . LAPAROSCOPIC GASTRIC BANDING  2011  . VENTRAL HERNIA REPAIR N/A 02/20/2015   Procedure: OPEN REPAIR VENTRAL INCISIONAL HERNIA;  Surgeon: Excell Seltzer, MD;  Location: [facility] ORS;  Service: General;  Laterality: N/A;  . WISDOM TOOTH EXTRACTION         Home Medications    Prior to Admission medications   Medication Sig Start Date End Date Taking? Authorizing Provider  ALPRAZolam Duanne Moron) 0.5 MG tablet Take 1 tablet (0.5 mg total) by mouth daily as needed. Patient taking differently: Take 0.5 mg by mouth daily as needed for anxiety.  06/30/16   Marletta Lor, MD  aspirin-acetaminophen-caffeine ([place]) [id number]  MG tablet Take 2 tablets by mouth 3 (three) times daily as needed (sinus headaches).    [provider]  azithromycin (ZITHROMAX) 250 MG tablet As directed 01/09/17   Laurey Morale, MD  losartan-hydrochlorothiazide (HYZAAR) 100-25 MG tablet Take 1 tablet by mouth daily. 06/30/16   Marletta Lor, MD  OVER THE COUNTER MEDICATION Take 1 tablet by mouth daily. Bee Caps    [provider]  sertraline (ZOLOFT) 50 MG tablet TAKE 1 TABLET DAILY (PHYSICAL SET UP FOR 06/30/16) Patient not taking:  Reported on 01/09/2017 12/24/16   Marletta Lor, MD  triamcinolone cream (KENALOG) 0.1 % APPLY TOPICALLY TWICE A DAY AS DIRECTED 01/01/17   Marletta Lor, MD  verapamil (CALAN-SR) 240 MG CR tablet TAKE 1 TABLET AT BEDTIME (PHYSICAL SET UP FOR 06/30/16) 12/24/16   Marletta Lor, MD    Family History Family History  Problem Relation Age of Onset  . Heart disease Mother   . Heart disease Maternal Aunt   . Cancer Maternal Uncle        pancreatic  . Heart disease Maternal Uncle   . Rheum arthritis Maternal Grandmother   . Colon cancer Neg Hx     Social History Social History  Substance Use Topics  . Smoking status: Current Every Day Smoker    Types: Cigars, Cigarettes    Last attempt to quit: 10/11/2004  . Smokeless tobacco: Never Used     Comment: Current Cigars 1 every other day -----QUIT cigarettes in 2006>> 1/2PPD x 15 years  . Alcohol use 1.8 - 2.4 oz/week    3 - 4 Shots of liquor per week     Comment: socially;     Allergies   Hydromet [hydrocodone-homatropine]; Iohexol; and Penicillins   Review of Systems Review of Systems  Constitutional: Positive for fever.  HENT: Positive for congestion. Negative for rhinorrhea.   Respiratory: Positive for cough and shortness of breath.   Cardiovascular: Positive for palpitations. Negative for chest pain and leg swelling.  Gastrointestinal: Negative for abdominal pain.  All other systems reviewed and are negative.    Physical Exam Updated Vital Signs BP (!) 159/92   Pulse 63   Temp 99.2 F (37.3 C) (Oral)   Resp (!) 21   SpO2 93%   Physical Exam  Constitutional: He is oriented to person, place, and time. He appears well-developed and well-nourished.  obese  HENT:  Head: Normocephalic and atraumatic.  Right Ear: External ear normal.  Left Ear: External ear normal.  Nose: Nose normal.  Eyes: Right eye exhibits no discharge. Left eye exhibits no discharge.  Neck: Neck supple.  Cardiovascular: Normal  rate, regular rhythm and normal heart sounds.   Pulmonary/Chest: Breath sounds normal. No accessory muscle usage. Tachypnea noted.  Increased WOB, no accessory muscle use. Speaks in complete sentences. Mildly decreased BS throughout, unclear if this is pathologic or due to increased chest wall diameter  Abdominal: Soft. There is no tenderness.  Musculoskeletal: He exhibits no edema.  Neurological: He is alert and oriented to person, place, and time.  Skin: Skin is warm and dry. He is not diaphoretic.  Nursing note and vitals reviewed.    ED Treatments / Results  Labs (all labs ordered are listed, but only abnormal results are displayed) Labs Reviewed  COMPREHENSIVE METABOLIC PANEL - Abnormal; Notable for the following:       Result Value   Chloride 100 (*)    Glucose, Bld 102 (*)    AST  71 (*)    ALT 65 (*)    Total Bilirubin 1.4 (*)    All other components within normal limits  BRAIN NATRIURETIC PEPTIDE - Abnormal; Notable for the following:    B Natriuretic Peptide 550.1 (*)    All other components within normal limits  TROPONIN I - Abnormal; Notable for the following:    Troponin I 0.05 (*)    All other components within normal limits  CBC WITH DIFFERENTIAL/PLATELET - Abnormal; Notable for the following:    WBC 10.8 (*)    Neutro Abs 9.8 (*)    Lymphs Abs 0.4 (*)    All other components within normal limits  D-DIMER, QUANTITATIVE (NOT AT [facility]) - Abnormal; Notable for the following:    D-Dimer, Quant 0.60 (*)    All other components within normal limits  TROPONIN I - Abnormal; Notable for the following:    Troponin I 0.05 (*)    All other components within normal limits    EKG  EKG Interpretation  Date/Time:  Saturday February 21 2017 14:49:50 EDT Ventricular Rate:  71 PR Interval:    QRS Duration: 129 QT Interval:  409 QTC Calculation: 445 R Axis:   -47 Text Interpretation:  Sinus rhythm Probable left atrial enlargement LVH with IVCD, LAD and secondary repol abnrm  no significant change since 2016 Confirmed by Sherwood Gambler [phone]) on 02/21/2017 3:00:26 PM       Radiology Dg Chest 2 View  Result Date: 02/21/2017 CLINICAL DATA:  Short of breath and fever for 3 days. EXAM: CHEST  2 VIEW COMPARISON:  04/11/2016 FINDINGS: Lateral view degraded by patient arm position. Both views are degraded by patient body habitus. Midline trachea. Cardiomegaly, accentuated by low lung volumes on the frontal. Mediastinal contours otherwise within normal limits. No pleural effusion or pneumothorax. New pulmonary interstitial prominence, accentuated by relatively decreased lung volumes. IMPRESSION: Decreased sensitivity and specificity exam due to technique related factors, as described above. Diminished lung volumes. Cardiomegaly with development of mild pulmonary interstitial prominence. Although this could be partially due to low lung volumes, pulmonary venous congestion or viral/atypical bacterial pneumonia cannot be excluded. Electronically Signed   By: Abigail Miyamoto M.D.   On: 02/21/2017 15:25   Ct Angio Chest Pe W/cm &/or Wo Cm  Result Date: 02/21/2017 CLINICAL DATA:  Shortness of breath and fever for 2 days. EXAM: CT ANGIOGRAPHY CHEST WITH CONTRAST TECHNIQUE: Multidetector CT imaging of the chest was performed using the standard protocol during bolus administration of intravenous contrast. Multiplanar CT image reconstructions and MIPs were obtained to evaluate the vascular anatomy. CONTRAST:  100 mL of Isovue 370 COMPARISON:  Chest x-ray from earlier today FINDINGS: Cardiovascular: Cardiomegaly is noted. The thoracic aorta demonstrates no aneurysm or dissection. Minimal atherosclerotic change. The main pulmonary artery measures 4.8 cm consistent with pulmonary hypertension. There is mild stairstep artifact in the left base. Taking this into account, no pulmonary emboli identified. Mediastinum/Nodes: A tiny left effusion is seen. No right-sided pleural effusion or pericardial  effusion. Adenopathy is seen in the mediastinum and hila. A representative right paratracheal node on series 4, image 30 measures 15 mm and a representative left hilar node measures 17 mm on image 53. Lungs/Pleura: Central airways are normal. No pneumothorax. Scattered atelectasis in the lungs. Scattered regions of mild air trapping. No suspicious nodules or masses. No focal infiltrates. Upper Abdomen: A gastric band is in place. No other abnormalities seen in the upper abdomen. Musculoskeletal: No chest wall abnormality. No acute or  significant osseous findings. Review of the MIP images confirms the above findings. IMPRESSION: 1. No pulmonary emboli. 2. Adenopathy is seen in the mediastinum and hila. This is nonspecific and could be reactive, inflammatory, or neoplastic. Recommend clinical correlation. Recommend short-term follow-up to ensure resolution. 3. Pulmonary arterial hypertension. 4. Cardiomegaly. 5. Mild atherosclerotic change in the thoracic aorta. 6. No other abnormalities. Aortic Atherosclerosis (ICD10-I70.0). Electronically Signed   By: Dorise Bullion III M.D   On: 02/21/2017 20:49    Procedures Procedures (including critical care time)  Medications Ordered in ED Medications  albuterol (PROVENTIL) (2.5 MG/3ML) 0.083% nebulizer solution 2.5 mg (2.5 mg Nebulization Given 02/21/17 1542)  hydrocortisone sodium succinate (SOLU-CORTEF) injection 200 mg (200 mg Intravenous Given 02/21/17 1617)  diphenhydrAMINE (BENADRYL) capsule 50 mg (50 mg Oral Given 02/21/17 1917)    Or  diphenhydrAMINE (BENADRYL) injection 50 mg ( Intravenous See Alternative 02/21/17 1917)  aspirin chewable tablet 324 mg (324 mg Oral Given 02/21/17 1645)  furosemide (LASIX) injection 40 mg (40 mg Intravenous Given 02/21/17 1718)  iopamidol (ISOVUE-370) 76 % injection 100 mL (100 mLs Intravenous Contrast Given 02/21/17 2020)     Initial Impression / Assessment and Plan / ED Course  I have reviewed the triage vital signs and the  nursing notes.  Pertinent labs & imaging results that were available during my care of the patient were reviewed by me and considered in my medical decision making (see chart for details).  Clinical Course as of Feb 22 2336  Sat Feb 21, 2017  1610 Patient feels slightly better with albuterol. I do not see much change in his lung exam, I do her air movement but no obvious wheezing or rails. Given frequent travel and a mildly elevated d-dimer, he will need a CT to rule out pulmonary embolism. However he has a prior allergy with rash/itching. Given this, will do emergency CT protocol with hydrocortisone and Benadryl. Patient understands this will be a long wait about 4 hours but also this test is indicated and needed.  [SG]  1701 BNP c/w heart failure. Will give dose of lasix. Mild trop elevation likely from strain, no ECG changes. These all could be from PE as well, so will continue with CTA. He will need admission, will see PE study before admission though given this could change what type of bed he needs  [SG]    Clinical Course User Index [SG] Sherwood Gambler, MD    Patient's workup shows no pulmonary embolism. His symptoms are most likely cubital to the elevated BNP consistent with new onset heart failure. He does feel somewhat better, unclear if this is from the initial diuresis or not. He is overall well appearing but mildly tachypneic. Low level troponin is more likely strain rather than a myocardial infarction. He was given aspirin in the ED. He will be admitted to [facility] for further workup and care. Dr. Roel Cluck excepts an admission in transfer.  Final Clinical Impressions(s) / ED Diagnoses   Final diagnoses:  Acute congestive heart failure, unspecified heart failure type [facility])    New Prescriptions New Prescriptions   No medications on file     Sherwood Gambler, MD 02/21/17 2338

## 2017-02-21 NOTE — Plan of Care (Signed)
51 yo HTN and OSA on CPAP Came in with dyspnea and occasional subjective fevers Recent travel   elevated trop 0.05 X2 CTA neg for  PE but Pulmonary hypertension and adenopathy CXR showing cardiomegaly  Had leg swelling for few weeks Possible new onset CHF? Will need echo and cards consult?  Accepted as  Obs tele  Maurie Musco 10:12 PM

## 2017-02-21 NOTE — ED Triage Notes (Addendum)
SOB and fever x 2 days. Pt with recent long airplane travel.

## 2017-02-22 ENCOUNTER — Encounter (HOSPITAL_COMMUNITY): Payer: Self-pay

## 2017-02-22 ENCOUNTER — Inpatient Hospital Stay (HOSPITAL_COMMUNITY): Payer: BLUE CROSS/BLUE SHIELD

## 2017-02-22 DIAGNOSIS — I5033 Acute on chronic diastolic (congestive) heart failure: Secondary | ICD-10-CM | POA: Diagnosis not present

## 2017-02-22 DIAGNOSIS — R590 Localized enlarged lymph nodes: Secondary | ICD-10-CM | POA: Diagnosis present

## 2017-02-22 DIAGNOSIS — F17290 Nicotine dependence, other tobacco product, uncomplicated: Secondary | ICD-10-CM | POA: Diagnosis present

## 2017-02-22 DIAGNOSIS — I110 Hypertensive heart disease with heart failure: Secondary | ICD-10-CM | POA: Diagnosis present

## 2017-02-22 DIAGNOSIS — I5031 Acute diastolic (congestive) heart failure: Secondary | ICD-10-CM | POA: Diagnosis not present

## 2017-02-22 DIAGNOSIS — Z880 Allergy status to penicillin: Secondary | ICD-10-CM | POA: Diagnosis not present

## 2017-02-22 DIAGNOSIS — I248 Other forms of acute ischemic heart disease: Secondary | ICD-10-CM | POA: Diagnosis present

## 2017-02-22 DIAGNOSIS — I340 Nonrheumatic mitral (valve) insufficiency: Secondary | ICD-10-CM

## 2017-02-22 DIAGNOSIS — G4733 Obstructive sleep apnea (adult) (pediatric): Secondary | ICD-10-CM | POA: Diagnosis not present

## 2017-02-22 DIAGNOSIS — Z6841 Body Mass Index (BMI) 40.0 and over, adult: Secondary | ICD-10-CM | POA: Diagnosis not present

## 2017-02-22 DIAGNOSIS — R7989 Other specified abnormal findings of blood chemistry: Secondary | ICD-10-CM | POA: Diagnosis not present

## 2017-02-22 DIAGNOSIS — R0609 Other forms of dyspnea: Secondary | ICD-10-CM

## 2017-02-22 DIAGNOSIS — I10 Essential (primary) hypertension: Secondary | ICD-10-CM | POA: Diagnosis not present

## 2017-02-22 DIAGNOSIS — I2721 Secondary pulmonary arterial hypertension: Secondary | ICD-10-CM | POA: Diagnosis present

## 2017-02-22 DIAGNOSIS — R945 Abnormal results of liver function studies: Secondary | ICD-10-CM | POA: Diagnosis present

## 2017-02-22 DIAGNOSIS — R748 Abnormal levels of other serum enzymes: Secondary | ICD-10-CM | POA: Diagnosis not present

## 2017-02-22 DIAGNOSIS — I421 Obstructive hypertrophic cardiomyopathy: Secondary | ICD-10-CM | POA: Diagnosis present

## 2017-02-22 DIAGNOSIS — I509 Heart failure, unspecified: Secondary | ICD-10-CM | POA: Diagnosis not present

## 2017-02-22 DIAGNOSIS — E876 Hypokalemia: Secondary | ICD-10-CM | POA: Diagnosis present

## 2017-02-22 DIAGNOSIS — Z9884 Bariatric surgery status: Secondary | ICD-10-CM | POA: Diagnosis not present

## 2017-02-22 DIAGNOSIS — R778 Other specified abnormalities of plasma proteins: Secondary | ICD-10-CM | POA: Diagnosis present

## 2017-02-22 DIAGNOSIS — R600 Localized edema: Secondary | ICD-10-CM

## 2017-02-22 DIAGNOSIS — Z79899 Other long term (current) drug therapy: Secondary | ICD-10-CM | POA: Diagnosis not present

## 2017-02-22 DIAGNOSIS — R0600 Dyspnea, unspecified: Secondary | ICD-10-CM | POA: Diagnosis present

## 2017-02-22 DIAGNOSIS — K219 Gastro-esophageal reflux disease without esophagitis: Secondary | ICD-10-CM | POA: Diagnosis present

## 2017-02-22 LAB — TROPONIN I
Troponin I: 0.06 ng/mL (ref <0.03)
Troponin I: 0.05 ng/mL (ref <0.03)
Troponin I: 0.04 ng/mL (ref <0.03)

## 2017-02-22 LAB — RAPID URINE DRUG SCREEN, HOSP PERFORMED
Amphetamines: NOT DETECTED
Barbiturates: NOT DETECTED
Benzodiazepines: NOT DETECTED
Cocaine: NOT DETECTED
Opiates: NOT DETECTED
Tetrahydrocannabinol: NOT DETECTED

## 2017-02-22 LAB — COMPREHENSIVE METABOLIC PANEL
ALT: 55 U/L (ref 17–63)
AST: 44 U/L — ABNORMAL HIGH (ref 15–41)
Albumin: 3.5 g/dL (ref 3.5–5.0)
Alkaline Phosphatase: 90 U/L (ref 38–126)
Anion gap: 10 (ref 5–15)
BUN: 18 mg/dL (ref 6–20)
CO2: 26 mmol/L (ref 22–32)
Calcium: 9.4 mg/dL (ref 8.9–10.3)
Chloride: 102 mmol/L (ref 101–111)
Creatinine, Ser: 0.99 mg/dL (ref 0.61–1.24)
GFR calc Af Amer: >60 mL/min (ref >60)
GFR calc non Af Amer: >60 mL/min (ref >60)
Glucose, Bld: 149 mg/dL — ABNORMAL HIGH (ref 65–99)
Potassium: 3.4 mmol/L — ABNORMAL LOW (ref 3.5–5.1)
Sodium: 138 mmol/L (ref 135–145)
Total Bilirubin: 1.1 mg/dL (ref 0.3–1.2)
Total Protein: 7.1 g/dL (ref 6.5–8.1)

## 2017-02-22 LAB — URINALYSIS, ROUTINE W REFLEX MICROSCOPIC
Bilirubin Urine: NEGATIVE
Glucose, UA: NEGATIVE mg/dL
Hgb urine dipstick: NEGATIVE
Ketones, ur: NEGATIVE mg/dL
Leukocytes, UA: NEGATIVE
Nitrite: NEGATIVE
Protein, ur: NEGATIVE mg/dL
Specific Gravity, Urine: 1.005 (ref 1.005–1.030)
pH: 6 (ref 5.0–8.0)

## 2017-02-22 LAB — CBC
HCT: 43 % (ref 39.0–52.0)
Hemoglobin: 13.8 g/dL (ref 13.0–17.0)
MCH: 29.3 pg (ref 26.0–34.0)
MCHC: 32.1 g/dL (ref 30.0–36.0)
MCV: 91.3 fL (ref 78.0–100.0)
Platelets: 183 10*3/uL (ref 150–400)
RBC: 4.71 MIL/uL (ref 4.22–5.81)
RDW: 13.9 % (ref 11.5–15.5)
WBC: 9.2 10*3/uL (ref 4.0–10.5)

## 2017-02-22 LAB — HIV ANTIBODY (ROUTINE TESTING W REFLEX): HIV Screen 4th Generation wRfx: NONREACTIVE

## 2017-02-22 LAB — MAGNESIUM: Magnesium: 2.1 mg/dL (ref 1.7–2.4)

## 2017-02-22 MED ORDER — ONDANSETRON HCL 4 MG PO TABS: 4.0000 mg | ORAL_TABLET | Freq: Four times a day (QID) | ORAL | Status: DC | PRN

## 2017-02-22 MED ORDER — ONDANSETRON HCL 4 MG/2ML IJ SOLN: 4.0000 mg | Freq: Four times a day (QID) | INTRAMUSCULAR | Status: DC | PRN

## 2017-02-22 MED ORDER — LOSARTAN POTASSIUM 50 MG PO TABS: 100.0000 mg | ORAL_TABLET | Freq: Every day | ORAL | Status: DC

## 2017-02-22 MED ORDER — LOSARTAN POTASSIUM 50 MG PO TABS
100.0000 mg | ORAL_TABLET | Freq: Every day | ORAL | Status: DC
  Administered 2017-02-22 – 2017-02-24 (×3): 100 mg via ORAL
  Filled 2017-02-22 (×3): qty 2

## 2017-02-22 MED ORDER — POTASSIUM CHLORIDE CRYS ER 20 MEQ PO TBCR
40.0000 meq | EXTENDED_RELEASE_TABLET | Freq: Two times a day (BID) | ORAL | Status: DC
  Administered 2017-02-22 – 2017-02-24 (×5): 40 meq via ORAL
  Filled 2017-02-22 (×5): qty 2

## 2017-02-22 MED ORDER — ACETAMINOPHEN 325 MG PO TABS
650.0000 mg | ORAL_TABLET | Freq: Four times a day (QID) | ORAL | Status: DC | PRN
  Administered 2017-02-22 – 2017-02-24 (×3): 650 mg via ORAL
  Filled 2017-02-22 (×3): qty 2

## 2017-02-22 MED ORDER — FUROSEMIDE 10 MG/ML IJ SOLN
40.0000 mg | Freq: Two times a day (BID) | INTRAMUSCULAR | Status: DC
  Administered 2017-02-22 – 2017-02-23 (×3): 40 mg via INTRAVENOUS
  Filled 2017-02-22 (×3): qty 4

## 2017-02-22 MED ORDER — ALPRAZOLAM 0.5 MG PO TABS: 0.5000 mg | ORAL_TABLET | Freq: Every day | ORAL | Status: DC | PRN

## 2017-02-22 MED ORDER — ENOXAPARIN SODIUM 80 MG/0.8ML ~~LOC~~ SOLN
80.0000 mg | SUBCUTANEOUS | Status: DC
Start: 2017-02-23 — End: 2017-02-24
  Administered 2017-02-23 – 2017-02-24 (×2): 80 mg via SUBCUTANEOUS
  Filled 2017-02-22 (×2): qty 0.8

## 2017-02-22 MED ORDER — ACETAMINOPHEN 650 MG RE SUPP: 650.0000 mg | Freq: Four times a day (QID) | RECTAL | Status: DC | PRN

## 2017-02-22 MED ORDER — VERAPAMIL HCL ER 240 MG PO TBCR
240.0000 mg | EXTENDED_RELEASE_TABLET | Freq: Every day | ORAL | Status: DC
  Administered 2017-02-22: 240 mg via ORAL
  Filled 2017-02-22: qty 1

## 2017-02-22 MED ORDER — ENOXAPARIN SODIUM 40 MG/0.4ML ~~LOC~~ SOLN
40.0000 mg | SUBCUTANEOUS | Status: DC
  Administered 2017-02-22: 40 mg via SUBCUTANEOUS
  Filled 2017-02-22: qty 0.4

## 2017-02-22 MED ORDER — HYDRALAZINE HCL 20 MG/ML IJ SOLN: 10.0000 mg | Freq: Four times a day (QID) | INTRAMUSCULAR | Status: DC | PRN

## 2017-02-22 NOTE — Progress Notes (Signed)
*  PRELIMINARY RESULTS* Echocardiogram 2D Echocardiogram has been performed.  Chad Lyons 02/22/2017, 5:13 PM

## 2017-02-22 NOTE — Progress Notes (Signed)
Pt does not wish to wear CPAP tonight. I told him to call if he changes his mind. RT will continue to monitor

## 2017-02-22 NOTE — Progress Notes (Signed)
The patient was admitted early this AM after midnight and H and P has been reviewd and I am in current agreement with the Assessment and Plan done by Dr. Loleta Books. The patient is a 51 yo male with a PMH of HTN, Morbid Obesity, OSA on CPAP, Plantar Fascitis and other comorbids who presented to [facility] with a cc of Dyspnea and Orthopnea. Patient was admitted and suspected to have an Acute on Chronic Diastolic CHF Exacerbation (Orthopenia, Elevated BNP, Cardiomegaly on Imaging and prior ECHO showing G1DD) in the setting of Hypertensive Heart Disease  and Cardiology was consulted for Further evaluation and management. We will be checking ECHOCardiogram and continuing IV Lasix. Per Cardiology, recommend continuing Losartan 100 mg po Daily. Patient is -1.825 Liters with the Lasix and will continue with Strict I's/O's, Daily Weights, and SLIV. Will place the patient on a Low Sodium Diet as he has been drinking a lot of soups. Patient's LFT's are trending down and Viral Hepatitis Panel is pending. Will continue to Monitor patient's clinical response to therapies and repeat Blood work in AM.

## 2017-02-22 NOTE — H&P (Signed)
History and Physical  Patient Name: Chad Lyons     HYQ:[id number]    DOB: 1965/11/11    DOA: 02/21/2017 PCP: Marletta Lor, MD  Patient coming from: Home --> [facility]  Chief Complaint: Dyspnea, orthopnea      HPI: Chad Lyons is a 51 y.o. male with a past medical history significant for HTN, morbid obesity with lap banding, and OSA on CPAP who presents with few days dyspnea.  The patient is at lower extremity swelling off and on over the last year, treated last December with furosemide, without previous history of CHF. His last few weeks, his swelling has gotten a lot worse again, but he thinks that it got better in the last few days with a steroid shot in his feet and ibuprofen for plantar fasciitis.  However, in the last 2-3 days, he's had significant increase in dyspnea on exertion and orthopnea. This progressed until last night he had to sit up several times gasping for air and taking off his CPAP and only comfortable and sitting propped up on pillows. This morning when he climbed the stairs after breakfast his wife noticed that he had to hold onto the wall because he was so out of breath, so she made him come to the emergency room.   He's had some increased, mostly nonproductive, nonproductive cough lately, sweats but no fever, feeling bloated. He's had no chest pain, no fever, chills, unilateral leg swelling, nor chest pain.  ED course: -Temp 99.78F, heart rate 78, respirations 24, blood pressure 154/94. Pulse ox 97% on room air -Na 137, K 3.6, Cr 0.98 (baseline 1.0), WBC 10.8K, Hgb 13.7 -BNP 550 -Troponin 0.05 -LFTs slightly elevated -Dimer normal -CXR showed congestion -CTA showed no PE, no pneumonia, report does not mention edema -ECG showed old IVCD -He was given bronchodilators and furosemide and felt somewhat better and [facility] were asked to accept in transfer for new CHF     ROS: Review of Systems  Respiratory: Positive for cough and shortness of breath.     Cardiovascular: Positive for orthopnea and PND. Negative for chest pain and leg swelling.  All other systems reviewed and are negative.         Past Medical History:  Diagnosis Date  . Diverticulitis    HX OF  . EKG, abnormal    HX OF LEFT ANTERIO FASCICULAR BLOCK ON 02-15-15 EKG  . GERD (gastroesophageal reflux disease)   . Heart murmur    NOW RESOLVED  . Hypertension   . OSA (obstructive sleep apnea) 11/27/2016  . Ventral incisional hernia     Past Surgical History:  Procedure Laterality Date  . COLON SURGERY  2006   colon resection for polyps  . COLONOSCOPY WITH PROPOFOL N/A 10/31/2016   Procedure: COLONOSCOPY WITH PROPOFOL;  Surgeon: Mauri Pole, MD;  Location: [facility];  Service: Endoscopy;  Laterality: N/A;  . HIATAL HERNIA REPAIR  1991  . INSERTION OF MESH N/A 02/20/2015   Procedure: INSERTION OF MESH;  Surgeon: Excell Seltzer, MD;  Location: [facility];  Service: General;  Laterality: N/A;  . LAPAROSCOPIC GASTRIC BANDING  2011  . VENTRAL HERNIA REPAIR N/A 02/20/2015   Procedure: OPEN REPAIR VENTRAL INCISIONAL HERNIA;  Surgeon: Excell Seltzer, MD;  Location: [facility];  Service: General;  Laterality: N/A;  . WISDOM TOOTH EXTRACTION      Social History: Patient lives with his wife.  The patient walks unassisted.  Smoker of cigars, most days.  Drinks socially only.  From [place], went to [employer].  Allergies  Allergen Reactions  . Hydromet [Hydrocodone-Homatropine] Itching  . Iohexol       Desc: PT DEVELOPS ITCHING AND HIVES SEVERAL HOURS AFTER INJECTION OF OMNIPAQUE   . Penicillins Itching    Has patient had a PCN reaction causing immediate rash, facial/tongue/throat swelling, SOB or lightheadedness with hypotension: No Has patient had a PCN reaction causing severe rash involving mucus membranes or skin necrosis: No Has patient had a PCN reaction that required hospitalization No Has patient had a PCN reaction occurring within the last 10 years: No If  all of the above answers are "NO", then may proceed with Cephalosporin use.     Family history: family history includes Cancer in his maternal uncle; Heart disease in his maternal aunt, maternal uncle, and mother; Rheum arthritis in his maternal grandmother.  Prior to Admission medications   Medication Sig Start Date End Date Taking? Authorizing Provider  ALPRAZolam Duanne Moron) 0.5 MG tablet Take 1 tablet (0.5 mg total) by mouth daily as needed. Patient taking differently: Take 0.5 mg by mouth daily as needed for anxiety.  06/30/16   Marletta Lor, MD  aspirin-acetaminophen-caffeine (EXCEDRIN MIGRAINE) [phone] MG tablet Take 2 tablets by mouth 3 (three) times daily as needed (sinus headaches).    [provider]  OVER THE COUNTER MEDICATION Take 1 tablet by mouth daily. Bee Caps    [provider]  sertraline (ZOLOFT) 50 MG tablet TAKE 1 TABLET DAILY (PHYSICAL SET UP FOR 06/30/16) Patient not taking: Reported on 01/09/2017 12/24/16   Marletta Lor, MD  triamcinolone cream (KENALOG) 0.1 % APPLY TOPICALLY TWICE A DAY AS DIRECTED 01/01/17   Marletta Lor, MD  verapamil (CALAN-SR) 240 MG CR tablet TAKE 1 TABLET AT BEDTIME (PHYSICAL SET UP FOR 06/30/16) 12/24/16   Marletta Lor, MD       Physical Exam: BP (!) 165/100 (BP Location: Right Arm)   Pulse 63   Temp 97.8 F (36.6 C) (Oral)   Resp 18   Ht 6\' 2"  (1.88 m)   Wt (!) 158.1 kg (348 lb 8 oz)   SpO2 100%   BMI 44.74 kg/m  General appearance: Well-developed, adult male, alert and in no acute distress.   Eyes: Anicteric, conjunctiva pink, lids and lashes normal. PERRL.    ENT: No nasal deformity, discharge, epistaxis.  Hearing normal. OP moist without lesions.   Neck: No neck masses.  Trachea midline.  No thyromegaly/tenderness. Lymph: No cervical or supraclavicular lymphadenopathy. Skin: Warm and dry.  No suspicious rashes or lesions. Cardiac: RRR, nl S1-S2, no murmurs appreciated.  Capillary  refill is brisk.  JVP not visible.  No LE edema.  Radial and DP pulses 2+ and symmetric. Respiratory: Tachypneic, berathing shallow, no rales or wheezes appreciated. Abdomen: Abdomen soft.  No TTP. No ascites, distension, hepatosplenomegaly.   MSK: No deformities or effusions.  No cyanosis or clubbing. Neuro: Cranial nerves 3-12 intact.  Sensation intact to light touch. Speech is fluent.  Muscle strength normal.    Psych: Sensorium intact and responding to questions, attention normal.  Behavior appropriate.  Affect normal.  Judgment and insight appear normal.     Labs on Admission:  I have personally reviewed following labs and imaging studies: CBC:  Recent Labs Lab 02/21/17 1530  WBC 10.8*  NEUTROABS 9.8*  HGB 13.7  HCT 40.4  MCV 89.0  PLT 741   Basic Metabolic Panel:  Recent Labs Lab 02/21/17 1530  NA 137  K 3.6  CL 100*  CO2 27  GLUCOSE 102*  BUN 16  CREATININE 0.98  CALCIUM 9.5   GFR: Estimated Creatinine Clearance: 142 mL/min (by C-G formula based on SCr of 0.98 mg/dL).  Liver Function Tests:  Recent Labs Lab 02/21/17 1530  AST 71*  ALT 65*  ALKPHOS 99  BILITOT 1.4*  PROT 7.6  ALBUMIN 3.9   Cardiac Enzymes:  Recent Labs Lab 02/21/17 1530 02/21/17 1918  TROPONINI 0.05* 0.05*        Radiological Exams on Admission: Personally reviewed CXR shows congestion and edema, CTA report reviewed: Dg Chest 2 View  Result Date: 02/21/2017 CLINICAL DATA:  Short of breath and fever for 3 days. EXAM: CHEST  2 VIEW COMPARISON:  04/11/2016 FINDINGS: Lateral view degraded by patient arm position. Both views are degraded by patient body habitus. Midline trachea. Cardiomegaly, accentuated by low lung volumes on the frontal. Mediastinal contours otherwise within normal limits. No pleural effusion or pneumothorax. New pulmonary interstitial prominence, accentuated by relatively decreased lung volumes. IMPRESSION: Decreased sensitivity and specificity exam due to  technique related factors, as described above. Diminished lung volumes. Cardiomegaly with development of mild pulmonary interstitial prominence. Although this could be partially due to low lung volumes, pulmonary venous congestion or viral/atypical bacterial pneumonia cannot be excluded. Electronically Signed   By: Abigail Miyamoto M.D.   On: 02/21/2017 15:25   Ct Angio Chest Pe W/cm &/or Wo Cm  Result Date: 02/21/2017 CLINICAL DATA:  Shortness of breath and fever for 2 days. EXAM: CT ANGIOGRAPHY CHEST WITH CONTRAST TECHNIQUE: Multidetector CT imaging of the chest was performed using the standard protocol during bolus administration of intravenous contrast. Multiplanar CT image reconstructions and MIPs were obtained to evaluate the vascular anatomy. CONTRAST:  100 mL of Isovue 370 COMPARISON:  Chest x-ray from earlier today FINDINGS: Cardiovascular: Cardiomegaly is noted. The thoracic aorta demonstrates no aneurysm or dissection. Minimal atherosclerotic change. The main pulmonary artery measures 4.8 cm consistent with pulmonary hypertension. There is mild stairstep artifact in the left base. Taking this into account, no pulmonary emboli identified. Mediastinum/Nodes: A tiny left effusion is seen. No right-sided pleural effusion or pericardial effusion. Adenopathy is seen in the mediastinum and hila. A representative right paratracheal node on series 4, image 30 measures 15 mm and a representative left hilar node measures 17 mm on image 53. Lungs/Pleura: Central airways are normal. No pneumothorax. Scattered atelectasis in the lungs. Scattered regions of mild air trapping. No suspicious nodules or masses. No focal infiltrates. Upper Abdomen: A gastric band is in place. No other abnormalities seen in the upper abdomen. Musculoskeletal: No chest wall abnormality. No acute or significant osseous findings. Review of the MIP images confirms the above findings. IMPRESSION: 1. No pulmonary emboli. 2. Adenopathy is seen in  the mediastinum and hila. This is nonspecific and could be reactive, inflammatory, or neoplastic. Recommend clinical correlation. Recommend short-term follow-up to ensure resolution. 3. Pulmonary arterial hypertension. 4. Cardiomegaly. 5. Mild atherosclerotic change in the thoracic aorta. 6. No other abnormalities. Aortic Atherosclerosis (ICD10-I70.0). Electronically Signed   By: Dorise Bullion III M.D   On: 02/21/2017 20:49    EKG: Independently reviewed. Rate 71, IVCD nonspecific, early repol pattern, no change from 2017.        Assessment/Plan  1. New onset CHF:  Presents with new dyspnea on exertion, orthopnea, PND, elevated BNP, elevated troponin and LFTs.  EF unknown.   -Furosemide 40 mg IV twice a day  -K supplement -Strict I/Os, daily  weights, telemetry  -Daily monitoring renal function   2. Elevated troponin:  Suspect this is from CHF. -Consult to Cardiology for risk stratification  3. Elevated LFTs:  Suspect congestive. -Will obtain viral hepatitis serologies  -Trend  4. Hypertension:  -Continue verapamil for now until EF known -Continue losartan -Hold HCTZ  5. Sleep apnea:  -Continue CPAP  6. Other medications:  -Xanax when necessary      DVT prophylaxis: Lovenox  Code Status: FULL  Family Communication: None present  Disposition Plan: Anticipate IV diuresis.  Likely will need 2-3 days diuresis Consults called: None overnight Admission status: INPATIENT         Medical decision making: Patient seen at 2:15 AM on 02/22/2017.  What exists of the patient's chart was reviewed in depth and summarized above.  Clinical condition: stable.        Edwin Dada [facility] Pager [phone]      At the time of admission, it appears that the appropriate admission status for this patient is INPATIENT. This is judged to be reasonable and necessary in order to provide the required intensity of service to ensure the patient's safety given  the presenting symptoms, physical exam findings, and initial radiographic and laboratory data in the context of their chronic comorbidities.  Together, these circumstances are felt to place him at high risk for further clinical deterioration threatening life, limb, or organ.   Patient requires inpatient status due to high intensity of service, high risk for further deterioration and high frequency of surveillance required because of this severe exacerbation of their chronic organ failure.  Factors support inpatient status include new onset CHF, in the setting of elevated troponin, elevated LFTs, tachycardic, and no previous history of CHF.  I certify that at the point of admission it is my clinical judgment that the patient will require inpatient hospital care spanning beyond 2 midnights from the point of admission and that early discharge would result in unnecessary risk of decompensation and readmission or threat to life, limb or bodily function.

## 2017-02-22 NOTE — Progress Notes (Signed)
ANTICOAGULATION CONSULT NOTE - Initial Consult  Pharmacy Consult for Lovenox Indication: VTE prophylaxis  Allergies  Allergen Reactions  . Hydromet [Hydrocodone-Homatropine] Itching  . Iohexol       Desc: PT DEVELOPS ITCHING AND HIVES SEVERAL HOURS AFTER INJECTION OF OMNIPAQUE   . Penicillins Itching    Has patient had a PCN reaction causing immediate rash, facial/tongue/throat swelling, SOB or lightheadedness with hypotension: No Has patient had a PCN reaction causing severe rash involving mucus membranes or skin necrosis: No Has patient had a PCN reaction that required hospitalization No Has patient had a PCN reaction occurring within the last 10 years: No If all of the above answers are "NO", then may proceed with Cephalosporin use.     Patient Measurements: Height: 6\' 2"  (188 cm) Weight: (!) 348 lb 8 oz (158.1 kg) IBW/kg (Calculated) : 82.2  Vital Signs: Temp: 97.7 F (36.5 C) (07/08 0513) Temp Source: Oral (07/08 0513) BP: 153/94 (07/08 0513) Pulse Rate: 56 (07/08 0513)  Labs:  Recent Labs  02/21/17 1530 02/21/17 1918 02/22/17 0419  HGB 13.7  --  13.8  HCT 40.4  --  43.0  PLT 173  --  183  CREATININE 0.98  --  0.99  TROPONINI 0.05* 0.05* 0.06*   Medical History: Past Medical History:  Diagnosis Date  . Diverticulitis    HX OF  . EKG, abnormal    HX OF LEFT ANTERIO FASCICULAR BLOCK ON 02-15-15 EKG  . GERD (gastroesophageal reflux disease)   . Heart murmur    NOW RESOLVED  . Hypertension   . OSA (obstructive sleep apnea) 11/27/2016  . Ventral incisional hernia    Assessment: 51 yo M PMH HTN, morbid obesity (BMI 45.3 kg/m2) w/ lap banding, OSA on CPAP p/w dyspnea. No current anticoagulation at home, getting prophylaxis lovenox 40mg  q24h IP, last dose given 7/8 at 0900. H/H wnl, PLTs 183, no s/sx bleeding noted  Goal of Therapy:  Lovenox dosing per BMI (0.5 mg/kg q24h) Anti-Xa level 0.3-0.6 units/mL 4 hrs after LMWH dose given Monitor platelets by  anticoagulation protocol: Yes   Plan:  Switch lovenox 40mg  subq q24h to lovenox 80mg  (0.5 mg/kg) subq q24h. F/u heparin lvl after dose tomorrow.   Nida Boatman, PharmD PGY1 Acute Care Pharmacy Resident Pager: [phone] 02/22/2017,9:26 AM

## 2017-02-22 NOTE — Consult Note (Signed)
Cardiology Consultation:   Patient ID: Chad Lyons; [id number]; 06-28-1966   Admit date: 02/21/2017 Date of Consult: 02/22/2017  Primary Care Provider: Marletta Lor, MD Primary Cardiologist: Dr. Bronson Ing    Patient Profile:   Chad Lyons is a 51 y.o. male with a hx of HTN, OSA on CPAP, lap banding and obesity  who is being seen today for the evaluation of CHF at the request of Dr. Alfredia Ferguson.   Hx of heart issue as kids. Unable to provide details. Recently started on CPAP 2-3 months ago. Social cigars smoker. Social alcohol drinking.  History of Present Illness:   Mr. Freund presented with 3 days history of progressive worsening shortness of breath and orthopnea. He has recently gained 30s to 40s lbs. Eats lots of sups. He does have a mild chest discomfort with nausea. Release with belching. His breathing has been improved on CPAP. Intermittent lower extremity edema. Denies syncope, palpitations, melena, blood in his stool or urine. Has abdominal tightness.  BNP 550. Troponin 0.05. LFTs slightly elevated. Chest x-ray with vascular congestion. Elevated d-dimer. CTA without PE however shows elevated pulmonary hypertension. Started on IV Lasix with approximately 3 L output. His breathing improved significantly overnight.  Echo in 2007 showed grade 1 diastolic dysfunction.  Past Medical History:  Diagnosis Date  . Diverticulitis    HX OF  . EKG, abnormal    HX OF LEFT ANTERIO FASCICULAR BLOCK ON 02-15-15 EKG  . GERD (gastroesophageal reflux disease)   . Heart murmur    NOW RESOLVED  . Hypertension   . OSA (obstructive sleep apnea) 11/27/2016  . Ventral incisional hernia     Past Surgical History:  Procedure Laterality Date  . COLON SURGERY  2006   colon resection for polyps  . COLONOSCOPY WITH PROPOFOL N/A 10/31/2016   Procedure: COLONOSCOPY WITH PROPOFOL;  Surgeon: Mauri Pole, MD;  Location: [facility];  Service: Endoscopy;  Laterality: N/A;  . HIATAL  HERNIA REPAIR  1991  . INSERTION OF MESH N/A 02/20/2015   Procedure: INSERTION OF MESH;  Surgeon: Excell Seltzer, MD;  Location: [facility];  Service: General;  Laterality: N/A;  . LAPAROSCOPIC GASTRIC BANDING  2011  . VENTRAL HERNIA REPAIR N/A 02/20/2015   Procedure: OPEN REPAIR VENTRAL INCISIONAL HERNIA;  Surgeon: Excell Seltzer, MD;  Location: [facility];  Service: General;  Laterality: N/A;  . WISDOM TOOTH EXTRACTION       Inpatient Medications: Scheduled Meds: . enoxaparin (LOVENOX) injection  40 mg Subcutaneous Q24H  . furosemide  40 mg Intravenous BID  . potassium chloride  40 mEq Oral BID  . verapamil  240 mg Oral QHS   Continuous Infusions:  PRN Meds: acetaminophen **OR** acetaminophen, ALPRAZolam, hydrALAZINE, ondansetron **OR** ondansetron (ZOFRAN) IV  Allergies:    Allergies  Allergen Reactions  . Hydromet [Hydrocodone-Homatropine] Itching  . Iohexol       Desc: PT DEVELOPS ITCHING AND HIVES SEVERAL HOURS AFTER INJECTION OF OMNIPAQUE   . Penicillins Itching    Has patient had a PCN reaction causing immediate rash, facial/tongue/throat swelling, SOB or lightheadedness with hypotension: No Has patient had a PCN reaction causing severe rash involving mucus membranes or skin necrosis: No Has patient had a PCN reaction that required hospitalization No Has patient had a PCN reaction occurring within the last 10 years: No If all of the above answers are "NO", then may proceed with Cephalosporin use.     Social History:   Social History   Social History  .  Marital status: Married    Spouse name: N/A  . Number of children: N/A  . Years of education: N/A   Occupational History  . Sales    Social History Main Topics  . Smoking status: Current Every Day Smoker    Types: Cigars, Cigarettes    Last attempt to quit: 10/11/2004  . Smokeless tobacco: Never Used     Comment: Current Cigars 1 every other day -----QUIT cigarettes in 2006>> 1/2PPD x 15 years  . Alcohol use 1.8  - 2.4 oz/week    3 - 4 Shots of liquor per week     Comment: socially;  . Drug use: No  . Sexual activity: Not on file     Comment: still smokes cigars   Other Topics Concern  . Not on file   Social History Narrative  . No narrative on file    Family History:   The patient's family history includes Cancer in his maternal uncle; Heart disease in his maternal aunt, maternal uncle, and mother; Rheum arthritis in his maternal grandmother. There is no history of Colon cancer.  ROS:  Please see the history of present illness.  ROS All other ROS reviewed and negative.     Physical Exam/Data:   Vitals:   02/22/17 0030 02/22/17 0100 02/22/17 0157 02/22/17 0513  BP: (!) 152/96 (!) 143/98 (!) 165/100 (!) 153/94  Pulse: 60 (!) 58 63 (!) 56  Resp: (!) 23 20 18 18   Temp:   97.8 F (36.6 C) 97.7 F (36.5 C)  TempSrc:   Oral Oral  SpO2: 96% 92% 100% 100%  Weight:   (!) 348 lb 8 oz (158.1 kg)   Height:   6\' 2"  (1.88 m)     Intake/Output Summary (Last 24 hours) at 02/22/17 0946 Last data filed at 02/22/17 0936  Gross per 24 hour  Intake              600 ml  Output             2425 ml  Net            -1825 ml   Filed Weights   02/22/17 0157  Weight: (!) 348 lb 8 oz (158.1 kg)   Body mass index is 44.74 kg/m.  General:  Morbidly obese male in no acute distress HEENT: normal Lymph: no adenopathy Neck: difficult to assess due to grith JVD Endocrine:  No thryomegaly Vascular: No carotid bruits; FA pulses 2+ bilaterally without bruits  Cardiac:  normal S1, S2; RRR; no murmur  Lungs:  clear to auscultation bilaterally, no wheezing, rhonchi or rales  Abd: soft, nontender, no hepatomegaly  Ext: Trace BL LE  edema Musculoskeletal:  No deformities, BUE and BLE strength normal and equal Skin: warm and dry  Neuro:  CNs 2-12 intact, no focal abnormalities noted Psych:  Normal affect   EKG:  The EKG was personally reviewed and demonstrates:  Sinus rhythm with early  repolarization Telemetry:  Telemetry was personally reviewed and demonstrates:  Sinus rhythm  Relevant CV Studies: Pending echo  Laboratory Data:  Chemistry Recent Labs Lab 02/21/17 1530 02/22/17 0419  NA 137 138  K 3.6 3.4*  CL 100* 102  CO2 27 26  GLUCOSE 102* 149*  BUN 16 18  CREATININE 0.98 0.99  CALCIUM 9.5 9.4  GFRNONAA >60 >60  GFRAA >60 >60  ANIONGAP 10 10     Recent Labs Lab 02/21/17 1530 02/22/17 0419  PROT 7.6 7.1  ALBUMIN 3.9  3.5  AST 71* 44*  ALT 65* 55  ALKPHOS 99 90  BILITOT 1.4* 1.1   Hematology Recent Labs Lab 02/21/17 1530 02/22/17 0419  WBC 10.8* 9.2  RBC 4.54 4.71  HGB 13.7 13.8  HCT 40.4 43.0  MCV 89.0 91.3  MCH 30.2 29.3  MCHC 33.9 32.1  RDW 14.0 13.9  PLT 173 183   Cardiac Enzymes Recent Labs Lab 02/21/17 1530 02/21/17 1918 02/22/17 0419  TROPONINI 0.05* 0.05* 0.06*   No results for input(s): TROPIPOC in the last 168 hours.  BNP Recent Labs Lab 02/21/17 1530  BNP 550.1*    DDimer  Recent Labs Lab 02/21/17 1530  DDIMER 0.60*    Radiology/Studies:  Dg Chest 2 View  Result Date: 02/21/2017 CLINICAL DATA:  Short of breath and fever for 3 days. EXAM: CHEST  2 VIEW COMPARISON:  04/11/2016 FINDINGS: Lateral view degraded by patient arm position. Both views are degraded by patient body habitus. Midline trachea. Cardiomegaly, accentuated by low lung volumes on the frontal. Mediastinal contours otherwise within normal limits. No pleural effusion or pneumothorax. New pulmonary interstitial prominence, accentuated by relatively decreased lung volumes. IMPRESSION: Decreased sensitivity and specificity exam due to technique related factors, as described above. Diminished lung volumes. Cardiomegaly with development of mild pulmonary interstitial prominence. Although this could be partially due to low lung volumes, pulmonary venous congestion or viral/atypical bacterial pneumonia cannot be excluded. Electronically Signed   By: Abigail Miyamoto M.D.   On: 02/21/2017 15:25   Ct Angio Chest Pe W/cm &/or Wo Cm  Result Date: 02/21/2017 CLINICAL DATA:  Shortness of breath and fever for 2 days. EXAM: CT ANGIOGRAPHY CHEST WITH CONTRAST TECHNIQUE: Multidetector CT imaging of the chest was performed using the standard protocol during bolus administration of intravenous contrast. Multiplanar CT image reconstructions and MIPs were obtained to evaluate the vascular anatomy. CONTRAST:  100 mL of Isovue 370 COMPARISON:  Chest x-ray from earlier today FINDINGS: Cardiovascular: Cardiomegaly is noted. The thoracic aorta demonstrates no aneurysm or dissection. Minimal atherosclerotic change. The main pulmonary artery measures 4.8 cm consistent with pulmonary hypertension. There is mild stairstep artifact in the left base. Taking this into account, no pulmonary emboli identified. Mediastinum/Nodes: A tiny left effusion is seen. No right-sided pleural effusion or pericardial effusion. Adenopathy is seen in the mediastinum and hila. A representative right paratracheal node on series 4, image 30 measures 15 mm and a representative left hilar node measures 17 mm on image 53. Lungs/Pleura: Central airways are normal. No pneumothorax. Scattered atelectasis in the lungs. Scattered regions of mild air trapping. No suspicious nodules or masses. No focal infiltrates. Upper Abdomen: A gastric band is in place. No other abnormalities seen in the upper abdomen. Musculoskeletal: No chest wall abnormality. No acute or significant osseous findings. Review of the MIP images confirms the above findings. IMPRESSION: 1. No pulmonary emboli. 2. Adenopathy is seen in the mediastinum and hila. This is nonspecific and could be reactive, inflammatory, or neoplastic. Recommend clinical correlation. Recommend short-term follow-up to ensure resolution. 3. Pulmonary arterial hypertension. 4. Cardiomegaly. 5. Mild atherosclerotic change in the thoracic aorta. 6. No other abnormalities. Aortic  Atherosclerosis (ICD10-I70.0). Electronically Signed   By: Dorise Bullion III M.D   On: 02/21/2017 20:49    Assessment and Plan:   1. Acute CHF - Presumed diastolic heart failure in setting of hypertensive heart disease. He does have a pulmonary hypertension on CTA. Pending echocardiogram for further evaluation. I & O negative 1.8L. Continue IV to diuresis.  2. HTN - BP elevated. Continue Varapamil. Consider resuming Losartan. Hold HCTZ.   3. Morbid obesity with left pending - He will benefit from weight loss.  4. OSA on CPAP   Signed, Donaldson, PA  02/22/2017 9:46 AM   The patient was seen and examined, and I agree with the history, physical exam, assessment and plan as documented above, with modifications as noted below. I have also personally reviewed all relevant documentation, old records, labs, and both radiographic and cardiovascular studies. I have also independently interpreted old and new ECG's.  51 yr old male with h/o morbid obesity s/p lap band in 2011 and OSA treated with CPAP x 2 months, and hypertension admitted with progressive exertional dyspnea and bilateral ankle swelling over the last several days to few weeks.  Had been walking 3 miles daily.  Denies chest pain. Has put on 40 lbs after lap band adjustment.  Used to smoke 0.5 ppd x 15 yrs, quit in 2006, now smokes cigars on motorcycle trips with friends. No h/o connective tissue disease.  ECG which I personally interpreted showed sinus rhythm with LVH, repolarization abnormalities, and nonspecific IVCD.  BNP 550.  CT angiogram chest showed pulmonary artery enlargement (4.8 cm) c/w pulmonary hypertension. There was nonspecific hilar and mediastinal adenopathy.  Has been diuresing well on IV Lasix.  Assessment and Plan: Progressive exertional dyspnea and pulmonary edema in context of prior h/o tobacco abuse, OSA, HTN, and morbid obesity suggestive of possible acute diastolic heart failure with  hypertensive heart disease.  Pulmonary hypertension may be due to all of the aforementioned conditions. Will review echocardiogram. Needs more optimal treatment of hypertension (would suggest continued use of ARB and eventually either chlorthalidone or HCTZ but may need addition of amlodipine rather than verapamil). Also needs low sodium diet. Continue IV Lasix 40 mg bid as he has had significant symptom resolution.  Kate Sable, MD, [facility]  02/22/2017 10:00 AM

## 2017-02-22 NOTE — ED Notes (Signed)
Attempted report x 1. RN busy, left call back number.

## 2017-02-22 NOTE — Progress Notes (Signed)
Pt arrived to [id number] via [facility]. Alert and oriented X 4. No complaints of pain. VSS. SR/SB on telemetry. No skin breakdown. Moderate fall risk. Oriented to room and call system.

## 2017-02-22 NOTE — ED Notes (Signed)
Pt given crackers and cheese upon request (per RN). Pt also given 292mL of H2O.

## 2017-02-23 DIAGNOSIS — R945 Abnormal results of liver function studies: Secondary | ICD-10-CM

## 2017-02-23 DIAGNOSIS — I272 Pulmonary hypertension, unspecified: Secondary | ICD-10-CM

## 2017-02-23 DIAGNOSIS — E876 Hypokalemia: Secondary | ICD-10-CM

## 2017-02-23 DIAGNOSIS — I517 Cardiomegaly: Secondary | ICD-10-CM

## 2017-02-23 DIAGNOSIS — R590 Localized enlarged lymph nodes: Secondary | ICD-10-CM

## 2017-02-23 DIAGNOSIS — I5031 Acute diastolic (congestive) heart failure: Secondary | ICD-10-CM

## 2017-02-23 LAB — ECHOCARDIOGRAM COMPLETE
Area-P 1/2: 3.38 cm2
E decel time: 222 msec
E/e' ratio: 21.84
FS: 31 % (ref 28–44)
Height: 74 in
IVS/LV PW RATIO, ED: 0.96
LA ID, A-P, ES: 52 mm
LA diam end sys: 52 mm
LA diam index: 1.77 cm/m2
LA vol A4C: 139 ml
LA vol index: 53.3 mL/m2
LA vol: 157 mL
LV E/e' medial: 21.84
LV E/e'average: 21.84
LV PW d: 23 mm — AB (ref 0.6–1.1)
LV dias vol index: 51 mL/m2
LV dias vol: 151 mL — AB (ref 62–150)
LV e' LATERAL: 3.92 cm/s
LV sys vol index: 17 mL/m2
LV sys vol: 51 mL (ref 21–61)
LVOT SV: 65 mL
LVOT VTI: 25.7 cm
LVOT area: 2.54 cm2
LVOT diameter: 18 mm
LVOT peak grad rest: 7 mmHg
LVOT peak vel: 129 cm/s
Lateral S' vel: 20.8 cm/s
MV Dec: 222
MV Peak grad: 3 mmHg
MV pk A vel: 53.7 m/s
MV pk E vel: 85.6 m/s
P 1/2 time: 65 ms
Simpson's disk: 66
Stroke v: 100 ml
TAPSE: 27.6 mm
TDI e' lateral: 3.92
TDI e' medial: 4.35
Weight: 5576 oz

## 2017-02-23 LAB — CBC WITH DIFFERENTIAL/PLATELET
Basophils Absolute: 0 10*3/uL (ref 0.0–0.1)
Basophils Relative: 0 %
Eosinophils Absolute: 0.2 10*3/uL (ref 0.0–0.7)
Eosinophils Relative: 2 %
HCT: 42.9 % (ref 39.0–52.0)
Hemoglobin: 14.1 g/dL (ref 13.0–17.0)
Lymphocytes Relative: 17 %
Lymphs Abs: 1.5 10*3/uL (ref 0.7–4.0)
MCH: 29.9 pg (ref 26.0–34.0)
MCHC: 32.9 g/dL (ref 30.0–36.0)
MCV: 91.1 fL (ref 78.0–100.0)
Monocytes Absolute: 0.9 10*3/uL (ref 0.1–1.0)
Monocytes Relative: 11 %
Neutro Abs: 6.1 10*3/uL (ref 1.7–7.7)
Neutrophils Relative %: 70 %
Platelets: 188 10*3/uL (ref 150–400)
RBC: 4.71 MIL/uL (ref 4.22–5.81)
RDW: 13.7 % (ref 11.5–15.5)
WBC: 8.6 10*3/uL (ref 4.0–10.5)

## 2017-02-23 LAB — COMPREHENSIVE METABOLIC PANEL
ALT: 54 U/L (ref 17–63)
AST: 35 U/L (ref 15–41)
Albumin: 3.4 g/dL — ABNORMAL LOW (ref 3.5–5.0)
Alkaline Phosphatase: 82 U/L (ref 38–126)
Anion gap: 10 (ref 5–15)
BUN: 20 mg/dL (ref 6–20)
CO2: 23 mmol/L (ref 22–32)
Calcium: 8.7 mg/dL — ABNORMAL LOW (ref 8.9–10.3)
Chloride: 105 mmol/L (ref 101–111)
Creatinine, Ser: 0.95 mg/dL (ref 0.61–1.24)
GFR calc Af Amer: >60 mL/min (ref >60)
GFR calc non Af Amer: >60 mL/min (ref >60)
Glucose, Bld: 101 mg/dL — ABNORMAL HIGH (ref 65–99)
Potassium: 4.2 mmol/L (ref 3.5–5.1)
Sodium: 138 mmol/L (ref 135–145)
Total Bilirubin: 0.5 mg/dL (ref 0.3–1.2)
Total Protein: 6.6 g/dL (ref 6.5–8.1)

## 2017-02-23 LAB — TROPONIN I: Troponin I: 0.04 ng/mL (ref <0.03)

## 2017-02-23 LAB — LIPID PANEL
Cholesterol: 124 mg/dL (ref 0–200)
HDL: 51 mg/dL (ref >40)
LDL Cholesterol: 58 mg/dL (ref 0–99)
Total CHOL/HDL Ratio: 2.4 RATIO
Triglycerides: 73 mg/dL (ref <150)
VLDL: 15 mg/dL (ref 0–40)

## 2017-02-23 LAB — T4, FREE: Free T4: 1 ng/dL (ref 0.61–1.12)

## 2017-02-23 LAB — TSH: TSH: 1.222 u[IU]/mL (ref 0.350–4.500)

## 2017-02-23 LAB — PHOSPHORUS: Phosphorus: 3.1 mg/dL (ref 2.5–4.6)

## 2017-02-23 LAB — MAGNESIUM: Magnesium: 2.3 mg/dL (ref 1.7–2.4)

## 2017-02-23 MED ORDER — VERAPAMIL HCL ER 240 MG PO TBCR
240.0000 mg | EXTENDED_RELEASE_TABLET | Freq: Every day | ORAL | Status: DC
  Administered 2017-02-23: 240 mg via ORAL
  Filled 2017-02-23: qty 1

## 2017-02-23 MED ORDER — VERAPAMIL HCL ER 240 MG PO TBCR: 240.0000 mg | EXTENDED_RELEASE_TABLET | Freq: Every day | ORAL | Status: DC

## 2017-02-23 MED ORDER — AMLODIPINE BESYLATE 10 MG PO TABS: 10.0000 mg | ORAL_TABLET | Freq: Every day | ORAL | Status: DC

## 2017-02-23 MED ORDER — FUROSEMIDE 40 MG PO TABS
40.0000 mg | ORAL_TABLET | Freq: Two times a day (BID) | ORAL | Status: DC
  Administered 2017-02-23 – 2017-02-24 (×2): 40 mg via ORAL
  Filled 2017-02-23 (×2): qty 1

## 2017-02-23 NOTE — Progress Notes (Signed)
PROGRESS NOTE    Chad Lyons  ZCH:[id number] DOB: 09-23-1965 DOA: 02/21/2017 PCP: Marletta Lor, MD   Brief Narrative:  Chad Lyons is a 51 y.o. male with a past medical history significant for HTN, morbid obesity with lap banding, and OSA on CPAP who presents with few days dyspnea. The patient is at lower extremity swelling off and on over the last year, treated last December with furosemide, without previous history of known CHF (though ECHO in 2007 shows G1DD). His last few weeks, his swelling has gotten a lot worse again, but he thinks that it got better in the last few days with a steroid shot in his feet and ibuprofen for plantar fasciitis.  However, in the last 2-3 days, he's had significant increase in dyspnea on exertion and orthopnea. This progressed until last night he had to sit up several times gasping for air and taking off his CPAP and only comfortable and sitting propped up on pillows. This morning when he climbed the stairs after breakfast his wife noticed that he had to hold onto the wall because he was so out of breath, so she made him come to the emergency room. He was worked up and admitted and found to have an acute Diastolic CHF Exacerbation. He is diuresing well and improving.   Assessment & Plan:   Principal Problem:   CHF (congestive heart failure) (HCC) Active Problems:   OBESITY, MORBID   Essential hypertension   OSA (obstructive sleep apnea)   Elevated troponin   Abnormal LFTs   Acute CHF (congestive heart failure) (HCC)   Pulmonary HTN (HCC)   Mediastinal adenopathy   Hypokalemia   Acute diastolic CHF (congestive heart failure) (HCC)  Acute Decompensation of Chronic Diastolic Grade 2 CHF with EF of 50-55% -Admitted to Telemetry -Presented with dyspnea on exertion, orthopnea, PND, elevated BNP, elevated troponin and LFTs. -CTA of chest showed no PE, no PNA but did show pHTN -Last ECHO showed EF of 70-75% with Grade 1 DD in 2007 -New  ECHO this Admission showed Systolic function was normal. The estimated ejection fraction was in the range of 50% to 55%. Wall motion was normal; there were no regional wall motion abnormalities. Features are consistent with a pseudonormal left ventricular filling pattern, with concomitant abnormal relaxation and increased filling pressure (grade 2 diastolic dysfunction).  -BNP on Admission was 550.1 -Strict I's/O's, Daily Weights, SLIV and Fluid Restrict to 1500 mL -C/w Furosemide 40 mg IV BID and KCl 40 mEQ BID -Cardiology Consulted and appreciate further management and evaluation -Patient is -3.565 Liters since Admission and has lost 1 lb -Continue to Monitor Volume Status and repeat CMP in AM -C/w Sodium and Fluid Restriction -Check Amublatory Pulse Ox and 6 Min Walk Screen Prior to D/C  Mildly Elevated Troponin  -Suspect this is from CHF. -Troponin went from 0.05 -> 0.06 -> 0.04 -Cardiology consulted for further risk stratification  Pulmonary Hypertension -The main pulmonary artery measures 4.8 cm consistent with pulmonary hypertension -ECHOCardiogram did not mention any PAP -C/w Diuresis as above -Follow up with Cardiology as an outpatient for possible Right Heart Cath at their discretion   Nonspecific Adenopathy in the Mediastinum and Hila -Chest CTA showed adenopathy is seen in the mediastinum and hila. This is nonspecific and could be reactive, inflammatory, or neoplastic. -Recommend short-term follow-up to ensure resolution; can be done as an outpatient   Abnormal LFT's/Transaminitis  -Suspect Congestive cause -AST went from 71 -> 44 -> 35 -ALT went from  65 -> 55 -> 54 -Viral Hepatitis Serologies Negative -Repeat CMP in AM   Hypertension -BP was 147/91 this AM -Continue Verapamil 240 mg po qHS for now and possible changing to Amlodpine -Continue Losartan 100 mg po Daily -Continue to Hold HCTZ as patient is being diuresed  -C/w IV Hydralazine as Necessary for SBP >170  or DBP > 100  Hypokalemia -Patient's K+ was 3.4 this AM and now improved to 4.2 -Continue to Monitor and Replete as Necessary -Repeat CMP in AM   Obstructive Sleep Apnea  -Continue CPAP  DVT prophylaxis: Enoxaparin 80 mg sq q24h Code Status: FULL CODE Family Communication: No family present at bedside Disposition Plan: Likely Home at D/C in the next 24-48 hours  Consultants:   Cardiology   Procedures:  ECHOCARDIOGRAM Study Conclusions  - Left ventricle: The cavity size was normal. Wall thickness was   increased in a pattern of severe LVH. Systolic function was   normal. The estimated ejection fraction was in the range of 50%   to 55%. Wall motion was normal; there were no regional wall   motion abnormalities. Features are consistent with a pseudonormal   left ventricular filling pattern, with concomitant abnormal   relaxation and increased filling pressure (grade 2 diastolic   dysfunction). - Aortic valve: There was trivial regurgitation. - Aortic root: The aortic root was mildly dilated. - Mitral valve: Calcified annulus. Mildly thickened leaflets .   There was mild systolic anterior motion of the chordal   structures. There was mild regurgitation. - Left atrium: The atrium was severely dilated.  Impressions:  - Normal LV systolic function; severe LVH; moderate diastolic   dysfunction; sclerotic aortic valve with trace AI; mild MR;   severe LAE; mild TR.   Antimicrobials: Anti-infectives    None     Subjective: Seen and examined at bedside and felt less swollen and felt better with SOB. No nausea or vomiting. No CP, lightheadedness, or dizziness.   Objective: Vitals:   02/22/17 2048 02/23/17 0009 02/23/17 0520 02/23/17 1142  BP: 140/83 134/80 130/81 (!) 147/91  Pulse: 62 (!) 58 (!) 50 (!) 50  Resp: 18 18 18 18   Temp: 98.4 F (36.9 C) 98.5 F (36.9 C) 97.7 F (36.5 C) 98.4 F (36.9 C)  TempSrc: Oral Oral Oral Oral  SpO2: 99% 100% 100% 99%    Weight:   (!) 157.6 kg (347 lb 8 oz)   Height:        Intake/Output Summary (Last 24 hours) at 02/23/17 1425 Last data filed at 02/23/17 1333  Gross per 24 hour  Intake              820 ml  Output             2200 ml  Net            -1380 ml   Filed Weights   02/22/17 0157 02/23/17 0520  Weight: (!) 158.1 kg (348 lb 8 oz) (!) 157.6 kg (347 lb 8 oz)   Examination: Physical Exam:  Constitutional: WN/WD obese AAM, NAD and appears calm and comfortable Eyes: Lids and conjunctivae normal, sclerae anicteric  ENMT: External Ears, Nose appear normal. Grossly normal hearing. Mucous membranes are moist.  Neck: Appears normal, supple, no cervical masses, normal ROM, no appreciable thyromegaly, no appreciable JVD Respiratory: Clear to auscultation bilaterally, no wheezing, rales, rhonchi or crackles. Normal respiratory effort and patient is not tachypenic. No accessory muscle use. Not wearing any supplemental O2 Cardiovascular: RRR,  no murmurs / rubs / gallops. S1 and S2 auscultated. Trace 1+ Lower extremity edema.  Abdomen: Soft, non-tender, non-distended. No masses palpated. No appreciable hepatosplenomegaly. Bowel sounds positive x4  GU: Deferred. Musculoskeletal: No clubbing / cyanosis of digits/nails. No joint deformity upper and lower extremities.  Skin: No rashes, lesions, ulcers on limited skin eval. No induration; Warm and dry.  Neurologic: CN 2-12 grossly intact with no focal deficits. Moves all extremities independently. Romberg sign cerebellar reflexes not assessed.  Psychiatric: Normal judgment and insight. Alert and oriented x 3. Normal mood and appropriate affect.   Data Reviewed: I have personally reviewed following labs and imaging studies  CBC:  Recent Labs Lab 02/21/17 1530 02/22/17 0419 02/23/17 0545  WBC 10.8* 9.2 8.6  NEUTROABS 9.8*  --  6.1  HGB 13.7 13.8 14.1  HCT 40.4 43.0 42.9  MCV 89.0 91.3 91.1  PLT 173 183 297   Basic Metabolic Panel:  Recent  Labs Lab 02/21/17 1530 02/22/17 0419 02/23/17 0545  NA 137 138 138  K 3.6 3.4* 4.2  CL 100* 102 105  CO2 27 26 23   GLUCOSE 102* 149* 101*  BUN 16 18 20   CREATININE 0.98 0.99 0.95  CALCIUM 9.5 9.4 8.7*  MG  --  2.1 2.3  PHOS  --   --  3.1   GFR: Estimated Creatinine Clearance: 146.3 mL/min (by C-G formula based on SCr of 0.95 mg/dL). Liver Function Tests:  Recent Labs Lab 02/21/17 1530 02/22/17 0419 02/23/17 0545  AST 71* 44* 35  ALT 65* 55 54  ALKPHOS 99 90 82  BILITOT 1.4* 1.1 0.5  PROT 7.6 7.1 6.6  ALBUMIN 3.9 3.5 3.4*   No results for input(s): LIPASE, AMYLASE in the last 168 hours. No results for input(s): AMMONIA in the last 168 hours. Coagulation Profile: No results for input(s): INR, PROTIME in the last 168 hours. Cardiac Enzymes:  Recent Labs Lab 02/21/17 1918 02/22/17 0419 02/22/17 1053 02/22/17 1856 02/23/17 0058  TROPONINI 0.05* 0.06* 0.05* 0.04* 0.04*   BNP (last 3 results) No results for input(s): PROBNP in the last 8760 hours. HbA1C: No results for input(s): HGBA1C in the last 72 hours. CBG: No results for input(s): GLUCAP in the last 168 hours. Lipid Profile:  Recent Labs  02/23/17 0545  CHOL 124  HDL 51  LDLCALC 58  TRIG 73  CHOLHDL 2.4   Thyroid Function Tests:  Recent Labs  02/23/17 0545  TSH 1.222  FREET4 1.00   Anemia Panel: No results for input(s): VITAMINB12, FOLATE, FERRITIN, TIBC, IRON, RETICCTPCT in the last 72 hours. Sepsis Labs: No results for input(s): PROCALCITON, LATICACIDVEN in the last 168 hours.  No results found for this or any previous visit (from the past 240 hour(s)).   Radiology Studies: Dg Chest 2 View  Result Date: 02/21/2017 CLINICAL DATA:  Short of breath and fever for 3 days. EXAM: CHEST  2 VIEW COMPARISON:  04/11/2016 FINDINGS: Lateral view degraded by patient arm position. Both views are degraded by patient body habitus. Midline trachea. Cardiomegaly, accentuated by low lung volumes on the  frontal. Mediastinal contours otherwise within normal limits. No pleural effusion or pneumothorax. New pulmonary interstitial prominence, accentuated by relatively decreased lung volumes. IMPRESSION: Decreased sensitivity and specificity exam due to technique related factors, as described above. Diminished lung volumes. Cardiomegaly with development of mild pulmonary interstitial prominence. Although this could be partially due to low lung volumes, pulmonary venous congestion or viral/atypical bacterial pneumonia cannot be excluded. Electronically Signed  By: Abigail Miyamoto M.D.   On: 02/21/2017 15:25   Ct Angio Chest Pe W/cm &/or Wo Cm  Result Date: 02/21/2017 CLINICAL DATA:  Shortness of breath and fever for 2 days. EXAM: CT ANGIOGRAPHY CHEST WITH CONTRAST TECHNIQUE: Multidetector CT imaging of the chest was performed using the standard protocol during bolus administration of intravenous contrast. Multiplanar CT image reconstructions and MIPs were obtained to evaluate the vascular anatomy. CONTRAST:  100 mL of Isovue 370 COMPARISON:  Chest x-ray from earlier today FINDINGS: Cardiovascular: Cardiomegaly is noted. The thoracic aorta demonstrates no aneurysm or dissection. Minimal atherosclerotic change. The main pulmonary artery measures 4.8 cm consistent with pulmonary hypertension. There is mild stairstep artifact in the left base. Taking this into account, no pulmonary emboli identified. Mediastinum/Nodes: A tiny left effusion is seen. No right-sided pleural effusion or pericardial effusion. Adenopathy is seen in the mediastinum and hila. A representative right paratracheal node on series 4, image 30 measures 15 mm and a representative left hilar node measures 17 mm on image 53. Lungs/Pleura: Central airways are normal. No pneumothorax. Scattered atelectasis in the lungs. Scattered regions of mild air trapping. No suspicious nodules or masses. No focal infiltrates. Upper Abdomen: A gastric band is in place. No  other abnormalities seen in the upper abdomen. Musculoskeletal: No chest wall abnormality. No acute or significant osseous findings. Review of the MIP images confirms the above findings. IMPRESSION: 1. No pulmonary emboli. 2. Adenopathy is seen in the mediastinum and hila. This is nonspecific and could be reactive, inflammatory, or neoplastic. Recommend clinical correlation. Recommend short-term follow-up to ensure resolution. 3. Pulmonary arterial hypertension. 4. Cardiomegaly. 5. Mild atherosclerotic change in the thoracic aorta. 6. No other abnormalities. Aortic Atherosclerosis (ICD10-I70.0). Electronically Signed   By: Dorise Bullion III M.D   On: 02/21/2017 20:49   Scheduled Meds: . enoxaparin (LOVENOX) injection  80 mg Subcutaneous Q24H  . furosemide  40 mg Intravenous BID  . losartan  100 mg Oral Daily  . potassium chloride  40 mEq Oral BID  . verapamil  240 mg Oral QHS   Continuous Infusions:   LOS: 1 day   Kerney Elbe, DO [facility] Pager [phone]  If 7PM-7AM, please contact night-coverage [email or web address] Password [email or web address] 02/23/2017, 2:25 PM

## 2017-02-23 NOTE — Progress Notes (Signed)
Progress Note  Patient Name: Chad Lyons Date of Encounter: 02/23/2017  Primary Cardiologist: Dr. Bronson Ing  Subjective   Patient is feeling well; denies chest pain, SOB, and palpitations. He is on room air and does not feel short of breath at rest. Recommend ambulation in halls.  Inpatient Medications    Scheduled Meds: . enoxaparin (LOVENOX) injection  80 mg Subcutaneous Q24H  . furosemide  40 mg Intravenous BID  . losartan  100 mg Oral Daily  . potassium chloride  40 mEq Oral BID  . verapamil  240 mg Oral QHS   Continuous Infusions:  PRN Meds: acetaminophen **OR** acetaminophen, ALPRAZolam, hydrALAZINE, ondansetron **OR** ondansetron (ZOFRAN) IV   Vital Signs    Vitals:   02/22/17 1259 02/22/17 2048 02/23/17 0009 02/23/17 0520  BP: (!) 144/76 140/83 134/80 130/81  Pulse: 61 62 (!) 58 (!) 50  Resp: 20 18 18 18   Temp: 98.7 F (37.1 C) 98.4 F (36.9 C) 98.5 F (36.9 C) 97.7 F (36.5 C)  TempSrc: Oral Oral Oral Oral  SpO2: 100% 99% 100% 100%  Weight:    (!) 347 lb 8 oz (157.6 kg)  Height:        Intake/Output Summary (Last 24 hours) at 02/23/17 1131 Last data filed at 02/23/17 1117  Gross per 24 hour  Intake              840 ml  Output             1850 ml  Net            -1010 ml   Filed Weights   02/22/17 0157 02/23/17 0520  Weight: (!) 348 lb 8 oz (158.1 kg) (!) 347 lb 8 oz (157.6 kg)     Physical Exam   General: Well developed, well nourished, male appearing in no acute distress. Head: Normocephalic, atraumatic.  Neck: Supple without bruits, no JVD. Lungs:  Resp regular and unlabored, CTA. Heart: RRR, S1, S2, no S3, S4, or murmur; no rub. Abdomen: Soft, non-tender, non-distended with normoactive bowel sounds. No hepatomegaly. No rebound/guarding. No obvious abdominal masses. Extremities: No clubbing, cyanosis, trace edema. Distal pedal pulses are 2+ bilaterally. Neuro: Alert and oriented X 3. Moves all extremities spontaneously. Psych:  Normal affect.  Labs    Chemistry Recent Labs Lab 02/21/17 1530 02/22/17 0419 02/23/17 0545  NA 137 138 138  K 3.6 3.4* 4.2  CL 100* 102 105  CO2 27 26 23   GLUCOSE 102* 149* 101*  BUN 16 18 20   CREATININE 0.98 0.99 0.95  CALCIUM 9.5 9.4 8.7*  PROT 7.6 7.1 6.6  ALBUMIN 3.9 3.5 3.4*  AST 71* 44* 35  ALT 65* 55 54  ALKPHOS 99 90 82  BILITOT 1.4* 1.1 0.5  GFRNONAA >60 >60 >60  GFRAA >60 >60 >60  ANIONGAP 10 10 10      Hematology Recent Labs Lab 02/21/17 1530 02/22/17 0419 02/23/17 0545  WBC 10.8* 9.2 8.6  RBC 4.54 4.71 4.71  HGB 13.7 13.8 14.1  HCT 40.4 43.0 42.9  MCV 89.0 91.3 91.1  MCH 30.2 29.3 29.9  MCHC 33.9 32.1 32.9  RDW 14.0 13.9 13.7  PLT 173 183 188    Cardiac Enzymes Recent Labs Lab 02/22/17 0419 02/22/17 1053 02/22/17 1856 02/23/17 0058  TROPONINI 0.06* 0.05* 0.04* 0.04*   No results for input(s): TROPIPOC in the last 168 hours.   BNP Recent Labs Lab 02/21/17 1530  BNP 550.1*     DDimer  Recent  Labs Lab 02/21/17 1530  DDIMER 0.60*     Radiology    Dg Chest 2 View  Result Date: 02/21/2017 CLINICAL DATA:  Short of breath and fever for 3 days. EXAM: CHEST  2 VIEW COMPARISON:  04/11/2016 FINDINGS: Lateral view degraded by patient arm position. Both views are degraded by patient body habitus. Midline trachea. Cardiomegaly, accentuated by low lung volumes on the frontal. Mediastinal contours otherwise within normal limits. No pleural effusion or pneumothorax. New pulmonary interstitial prominence, accentuated by relatively decreased lung volumes. IMPRESSION: Decreased sensitivity and specificity exam due to technique related factors, as described above. Diminished lung volumes. Cardiomegaly with development of mild pulmonary interstitial prominence. Although this could be partially due to low lung volumes, pulmonary venous congestion or viral/atypical bacterial pneumonia cannot be excluded. Electronically Signed   By: Abigail Miyamoto M.D.   On:  02/21/2017 15:25   Ct Angio Chest Pe W/cm &/or Wo Cm  Result Date: 02/21/2017 CLINICAL DATA:  Shortness of breath and fever for 2 days. EXAM: CT ANGIOGRAPHY CHEST WITH CONTRAST TECHNIQUE: Multidetector CT imaging of the chest was performed using the standard protocol during bolus administration of intravenous contrast. Multiplanar CT image reconstructions and MIPs were obtained to evaluate the vascular anatomy. CONTRAST:  100 mL of Isovue 370 COMPARISON:  Chest x-ray from earlier today FINDINGS: Cardiovascular: Cardiomegaly is noted. The thoracic aorta demonstrates no aneurysm or dissection. Minimal atherosclerotic change. The main pulmonary artery measures 4.8 cm consistent with pulmonary hypertension. There is mild stairstep artifact in the left base. Taking this into account, no pulmonary emboli identified. Mediastinum/Nodes: A tiny left effusion is seen. No right-sided pleural effusion or pericardial effusion. Adenopathy is seen in the mediastinum and hila. A representative right paratracheal node on series 4, image 30 measures 15 mm and a representative left hilar node measures 17 mm on image 53. Lungs/Pleura: Central airways are normal. No pneumothorax. Scattered atelectasis in the lungs. Scattered regions of mild air trapping. No suspicious nodules or masses. No focal infiltrates. Upper Abdomen: A gastric band is in place. No other abnormalities seen in the upper abdomen. Musculoskeletal: No chest wall abnormality. No acute or significant osseous findings. Review of the MIP images confirms the above findings. IMPRESSION: 1. No pulmonary emboli. 2. Adenopathy is seen in the mediastinum and hila. This is nonspecific and could be reactive, inflammatory, or neoplastic. Recommend clinical correlation. Recommend short-term follow-up to ensure resolution. 3. Pulmonary arterial hypertension. 4. Cardiomegaly. 5. Mild atherosclerotic change in the thoracic aorta. 6. No other abnormalities. Aortic Atherosclerosis  (ICD10-I70.0). Electronically Signed   By: Dorise Bullion III M.D   On: 02/21/2017 20:49     Telemetry    NSR - Personally Reviewed  ECG    No new tracings - Personally Reviewed   Cardiac Studies   Echocardiogram 02/22/17: Study Conclusions - Left ventricle: The cavity size was normal. Wall thickness was   increased in a pattern of severe LVH. Systolic function was   normal. The estimated ejection fraction was in the range of 50%   to 55%. Wall motion was normal; there were no regional wall   motion abnormalities. Features are consistent with a pseudonormal   left ventricular filling pattern, with concomitant abnormal   relaxation and increased filling pressure (grade 2 diastolic   dysfunction). - Aortic valve: There was trivial regurgitation. - Aortic root: The aortic root was mildly dilated. - Mitral valve: Calcified annulus. Mildly thickened leaflets .   There was mild systolic anterior motion of the chordal  structures. There was mild regurgitation. - Left atrium: The atrium was severely dilated.  Impressions: - Normal LV systolic function; severe LVH; moderate diastolic   dysfunction; sclerotic aortic valve with trace AI; mild MR;   severe LAE; mild TR.  Patient Profile     51 y.o. male with a hx of HTN, OSA on CPAP, lap banding and obesity  who is being seen for the evaluation of CHF.  Assessment & Plan    1. Acute diastolic heart failure, DOE - CTA without PE but with pulmonary HTN - echo with grade 2 DD and preserved EF - diuresing on 40 mg IV lasix BID - overall net negative 3.4 L with 1.8 L urine output yesterday - sCr and Na stable - pt appears near euvolemic on exam. Consider transitioning IV lasix to PO - discussed sodium restriction and importance of daily weights - anticipate discharge early this week - please ambulate patient in halls with pulse ox   2. HTN - on losartan 100 mg daily and verapamil 240 mg daily - pressures have been moderately  controlled - sBP in the 130-140s - consider transitioning verapamil to norvasc - Holding HCTZ with diuresis   Signed, Ledora Bottcher , PA-C 11:31 AM 02/23/2017 Pager: [phone]

## 2017-02-23 NOTE — Progress Notes (Signed)
02/23/2017- Pt declines use of CPAP.

## 2017-02-24 DIAGNOSIS — I517 Cardiomegaly: Secondary | ICD-10-CM

## 2017-02-24 LAB — CBC WITH DIFFERENTIAL/PLATELET
Basophils Absolute: 0 10*3/uL (ref 0.0–0.1)
Basophils Relative: 0 %
Eosinophils Absolute: 0.2 10*3/uL (ref 0.0–0.7)
Eosinophils Relative: 2 %
HCT: 44.4 % (ref 39.0–52.0)
Hemoglobin: 14.3 g/dL (ref 13.0–17.0)
Lymphocytes Relative: 21 %
Lymphs Abs: 1.7 10*3/uL (ref 0.7–4.0)
MCH: 29.8 pg (ref 26.0–34.0)
MCHC: 32.2 g/dL (ref 30.0–36.0)
MCV: 92.5 fL (ref 78.0–100.0)
Monocytes Absolute: 1.1 10*3/uL — ABNORMAL HIGH (ref 0.1–1.0)
Monocytes Relative: 14 %
Neutro Abs: 5.3 10*3/uL (ref 1.7–7.7)
Neutrophils Relative %: 63 %
Platelets: 208 10*3/uL (ref 150–400)
RBC: 4.8 MIL/uL (ref 4.22–5.81)
RDW: 13.7 % (ref 11.5–15.5)
WBC: 8.4 10*3/uL (ref 4.0–10.5)

## 2017-02-24 LAB — COMPREHENSIVE METABOLIC PANEL
ALT: 48 U/L (ref 17–63)
AST: 29 U/L (ref 15–41)
Albumin: 3.4 g/dL — ABNORMAL LOW (ref 3.5–5.0)
Alkaline Phosphatase: 78 U/L (ref 38–126)
Anion gap: 8 (ref 5–15)
BUN: 20 mg/dL (ref 6–20)
CO2: 26 mmol/L (ref 22–32)
Calcium: 8.8 mg/dL — ABNORMAL LOW (ref 8.9–10.3)
Chloride: 104 mmol/L (ref 101–111)
Creatinine, Ser: 0.91 mg/dL (ref 0.61–1.24)
GFR calc Af Amer: >60 mL/min (ref >60)
GFR calc non Af Amer: >60 mL/min (ref >60)
Glucose, Bld: 97 mg/dL (ref 65–99)
Potassium: 4.2 mmol/L (ref 3.5–5.1)
Sodium: 138 mmol/L (ref 135–145)
Total Bilirubin: 0.8 mg/dL (ref 0.3–1.2)
Total Protein: 6.5 g/dL (ref 6.5–8.1)

## 2017-02-24 LAB — HEPATITIS PANEL, ACUTE
HCV Ab: <0.1 s/co ratio (ref 0.0–0.9)
Hep A IgM: NEGATIVE
Hep B C IgM: NEGATIVE
Hepatitis B Surface Ag: NEGATIVE

## 2017-02-24 LAB — HEMOGLOBIN A1C
Hgb A1c MFr Bld: 5.8 % — ABNORMAL HIGH (ref 4.8–5.6)
Mean Plasma Glucose: 120 mg/dL

## 2017-02-24 LAB — PHOSPHORUS: Phosphorus: 2.4 mg/dL — ABNORMAL LOW (ref 2.5–4.6)

## 2017-02-24 LAB — MAGNESIUM: Magnesium: 2.3 mg/dL (ref 1.7–2.4)

## 2017-02-24 MED ORDER — K PHOS MONO-SOD PHOS DI & MONO 155-852-130 MG PO TABS
500.0000 mg | ORAL_TABLET | Freq: Three times a day (TID) | ORAL | Status: DC
  Administered 2017-02-24: 500 mg via ORAL
  Filled 2017-02-24: qty 2

## 2017-02-24 MED ORDER — LOSARTAN POTASSIUM 100 MG PO TABS: 100.0000 mg | ORAL_TABLET | Freq: Every day | ORAL | 0 refills | Status: DC

## 2017-02-24 MED ORDER — SODIUM PHOSPHATES 45 MMOLE/15ML IV SOLN
20.0000 mmol | Freq: Once | INTRAVENOUS | Status: DC
  Administered 2017-02-24: 20 mmol via INTRAVENOUS
  Filled 2017-02-24 (×2): qty 6.67

## 2017-02-24 MED ORDER — FUROSEMIDE 40 MG PO TABS
40.0000 mg | ORAL_TABLET | Freq: Two times a day (BID) | ORAL | 0 refills | Status: DC
Start: 2017-02-24 — End: 2017-03-25

## 2017-02-24 MED ORDER — POTASSIUM CHLORIDE CRYS ER 20 MEQ PO TBCR: 20.0000 meq | EXTENDED_RELEASE_TABLET | Freq: Every day | ORAL | 0 refills | Status: DC

## 2017-02-24 MED ORDER — K PHOS MONO-SOD PHOS DI & MONO 155-852-130 MG PO TABS: 500.0000 mg | ORAL_TABLET | Freq: Three times a day (TID) | ORAL | 0 refills | Status: DC

## 2017-02-24 NOTE — Discharge Summary (Signed)
Physician Discharge Summary  Ballard Budney ZOX:[id number] DOB: 22-May-1966 DOA: 02/21/2017  PCP: Marletta Lor, MD  Admit date: 02/21/2017 Discharge date: 02/24/2017  Admitted From: Home Disposition: Home  Recommendations for Outpatient Follow-up:  1. Follow up with PCP in 1-2 weeks on Friday 02/27/17 at 3:45 pm 2. Follow up with Cardiology within 7-10 days for Heart Failure Follow Up and for HOCM Evaluation with a Cardiac MRI 3. Follow up with Repeat Imaging of Nonspecific Adenopathy in the Mediastinum and Hila seen on CTA 4. Please obtain CMP/CBC, Mag, Phos within one week  Home Health: No Equipment/Devices: None    Discharge Condition: Stable CODE STATUS: FULL CODE Diet recommendation: Heart Healthy  Brief/Interim Summary: Tedd Cottrill Evansis a 51 y.o.malewith a past medical history significant for HTN, morbid obesity with lap banding, and OSA on CPAPwho presents with few days of dyspnea. The patient has had lower extremity swelling off and on over the last year, treated last December with furosemide, without previous history of known CHF (though ECHO in 2007 shows G1DD). This last few weeks, his swelling has gotten a lot worse again, but he thinks that it got better in the last few days with a steroid shot in his feet and ibuprofen for plantar fasciitis.  However, in the last 2-3 days, he's had significant increase in dyspnea on exertion and orthopnea. This progressed until last night he had to sit up several times gasping for air and taking off his CPAP and only comfortable and sitting propped up on pillows. This morning when he climbed the stairs after breakfast his wife noticed that he had to hold onto the wall because he was so out of breath, so she made him come to the emergency room. He was worked up and admitted and found to have an Acute Diastolic CHF Exacerbation. D-Dimer was elevated so he had a CTA of the chest which did not show any PE. He was started on IV Diuresis  and Cardiology was consulted for further evaluation and recommendations. ECHO was obtained and showed patient had Grade 2 DD. His swelling and dyspnea improved and he was transitioned to po Lasix. Echo did show systolic anterior motion of the anterior MVL and in the setting of severe LVH and ? HOCM but per Cardiology can assess further with Cardiac MRI as an outpatient. Patient was deemed medically stable to D/C Home today and will need to follow up with PCP and Cardiology. His HCTZ has been stopped as he was placed on po Lasix.   Discharge Diagnoses:  Principal Problem:   CHF (congestive heart failure) (HCC) Active Problems:   OBESITY, MORBID   Essential hypertension   OSA (obstructive sleep apnea)   Elevated troponin   Abnormal LFTs   Acute CHF (congestive heart failure) (HCC)   Pulmonary HTN (HCC)   Mediastinal adenopathy   Hypokalemia   Acute diastolic CHF (congestive heart failure) (HCC)   LVH (left ventricular hypertrophy)  Acute Decompensation of Chronic Diastolic Grade 2 CHF with EF of 50-55%, improved -Admitted to Telemetry -Presented with dyspnea on exertion, orthopnea, PND, elevated BNP, elevated troponin and LFTs. -CTA of chest showed no PE, no PNA but did show pHTN -Last ECHO showed EF of 70-75% with Grade 1 DD in 2007 -New ECHO this Admission showed Systolic function was normal. The estimated ejection fraction was in the range of 50% to 55%. Wall motion was normal; there were no regional wall motion abnormalities. Features are consistent with a pseudonormal left ventricular filling pattern,  with concomitant abnormalrelaxation and increased filling pressure (grade 2 diastolic dysfunction).  -BNP on Admission was 550.1 -Strict I's/O's, Daily Weights, SLIV and Fluid Restrict to 1500 mL while hospitalized  -Changed Furosemide 40mg  IV BID to 40 mg po BID and gave and KCl 20 mEQ Daily -Cardiology Consulted and appreciate further management and evaluation -Patient is -4.018  Liters since Admission and has lost 4 lbs -Continue to Monitor Volume Status and repeat CMP as an outpatient  -C/w Sodium and Fluid Restriction at 1500 mL -Follow up with PCP and Cardiology as an outpatient   Mildly Elevated Troponin -Suspect this is from CHF and Demand Ischemia -Troponin went from 0.05 -> 0.06 -> 0.04 -Cardiology consulted for further risk stratification -Cardiology recommending no further ischemic workup at this time.   Pulmonary Hypertension -The main pulmonary artery measures 4.8 cm consistent with pulmonary hypertension -ECHOCardiogram did not mention any PAP -C/w Diuresis with po Lasix 40 mg BID -Follow up with Cardiology and Heart Failure Clinic as an outpatient for possible Right Heart Cath at their discretion   Nonspecific Adenopathy in the Mediastinum and Hila -Chest CTA showed adenopathy is seen in the mediastinum and hila. This is nonspecific and could be reactive, inflammatory, or neoplastic. -Recommend short-term follow-up to ensure resolution; can be done as an outpatient  -Follow up with PCP to refer  Abnormal LFT's/Transaminitis, improved -Suspect Congestive cause -AST went from 71 -> 44 -> 35 -> 29 -ALT went from 65 -> 55 -> 54 -> 48 -Viral Hepatitis Serologies Negative -Repeat CMP as ab outpatient    Hypertension -BP was 142/94 this AM -Continue Verapamil 240 mg po qHS for now and possible changing to Amlodpine at Cardiology's discretion as an outpatient  -C/w Losartan 100 mg po Daily -Discontinued HCTZ at D/C -Started po Lasix 40 mg BID  -Was on IV Hydralazine as Necessary for SBP >170 or DBP > 100 -Follow up with PCP and Cardiology as an outpatient   Hypokalemia, improved -Patient's K+ was 4.2 this AM -Continue to Monitor and Replete as Necessary -Repeat CMP as an outpatient   Hypophosphatemia -Patient's Phos Level this AM was 2.4 -Replete with IV KPhos and changed to po KPhos 500 mg po TID x 3 doses -Continue to Monitor and  Replete as Necessary -Repeat Phos as an outpatient  Obstructive Sleep Apnea -Continue CPAP  ?HOCM -Echo did show systolic anterior motion of the anterior MVL and in the setting of severe LVH, need to consider HOCM -Per Cardiology Outpatient Cardiac MRI   Discharge Instructions  Discharge Instructions    (HEART FAILURE PATIENTS) Call MD:  Anytime you have any of the following symptoms: 1) 3 pound weight gain in 24 hours or 5 pounds in 1 week 2) shortness of breath, with or without a dry hacking cough 3) swelling in the hands, feet or stomach 4) if you have to sleep on extra pillows at night in order to breathe.    Complete by:  As directed    Call MD for:  difficulty breathing, headache or visual disturbances    Complete by:  As directed    Call MD for:  extreme fatigue    Complete by:  As directed    Call MD for:  hives    Complete by:  As directed    Call MD for:  persistant dizziness or light-headedness    Complete by:  As directed    Call MD for:  persistant nausea and vomiting    Complete by:  As  directed    Call MD for:  redness, tenderness, or signs of infection (pain, swelling, redness, odor or green/yellow discharge around incision site)    Complete by:  As directed    Call MD for:  severe uncontrolled pain    Complete by:  As directed    Call MD for:  temperature >100.4    Complete by:  As directed    Diet - low sodium heart healthy    Complete by:  As directed    Discharge instructions    Complete by:  As directed    Follow up with PCP and Cardiology as an outpatient. Take all medications as prescribed. If symptoms change or worsen please return to the ED for evaluation.   Increase activity slowly    Complete by:  As directed      Allergies as of 02/24/2017      Reactions   Hydromet [hydrocodone-homatropine] Itching   Iohexol Hives, Itching   PT DEVELOPS ITCHING AND HIVES SEVERAL HOURS AFTER INJECTION OF OMNIPAQUE   Penicillins Itching   Has patient had a PCN  reaction causing immediate rash, facial/tongue/throat swelling, SOB or lightheadedness with hypotension: Yes Has patient had a PCN reaction causing severe rash involving mucus membranes or skin necrosis: No Has patient had a PCN reaction that required hospitalization: No Has patient had a PCN reaction occurring within the last 10 years: No If all of the above answers are "NO", then may proceed with Cephalosporin use.      Medication List    STOP taking these medications   aspirin-acetaminophen-caffeine 250-250-65 MG tablet Commonly known as:  EXCEDRIN MIGRAINE   DUEXIS 800-26.6 MG Tabs Generic drug:  Ibuprofen-Famotidine   losartan-hydrochlorothiazide 100-25 MG tablet Commonly known as:  HYZAAR   OVER THE COUNTER MEDICATION   sertraline 50 MG tablet Commonly known as:  ZOLOFT     TAKE these medications   ALPRAZolam 0.5 MG tablet Commonly known as:  XANAX Take 1 tablet (0.5 mg total) by mouth daily as needed. What changed:  reasons to take this   furosemide 40 MG tablet Commonly known as:  LASIX Take 1 tablet (40 mg total) by mouth 2 (two) times daily.   losartan 100 MG tablet Commonly known as:  COZAAR Take 1 tablet (100 mg total) by mouth daily.   phosphorus 155-852-130 MG tablet Commonly known as:  K PHOS NEUTRAL Take 2 tablets (500 mg total) by mouth 3 (three) times daily.   potassium chloride SA 20 MEQ tablet Commonly known as:  K-DUR,KLOR-CON Take 1 tablet (20 mEq total) by mouth daily.   triamcinolone cream 0.1 % Commonly known as:  KENALOG APPLY TOPICALLY TWICE A DAY AS DIRECTED   verapamil 240 MG CR tablet Commonly known as:  CALAN-SR TAKE 1 TABLET AT BEDTIME (PHYSICAL SET UP FOR 06/30/16) What changed:  See the new instructions.      Follow-up Information    Marletta Lor, MD. Call in 1 week(s).   Specialty:  Internal Medicine Why:  Call to schedule a follow up appointment within 1 week Contact information: [place] [address] [phone]        [facility] Follow up.   Why:  Follow up with Cardiology Clinic within 7-10 days and do an outpatient Cardiac MRI Contact information: [place] [address] [phone]         Allergies  Allergen Reactions  . Hydromet [Hydrocodone-Homatropine] Itching  .  Iohexol Hives and Itching    PT DEVELOPS ITCHING AND HIVES SEVERAL HOURS AFTER INJECTION OF OMNIPAQUE   . Penicillins Itching    Has patient had a PCN reaction causing immediate rash, facial/tongue/throat swelling, SOB or lightheadedness with hypotension: Yes Has patient had a PCN reaction causing severe rash involving mucus membranes or skin necrosis: No Has patient had a PCN reaction that required hospitalization: No Has patient had a PCN reaction occurring within the last 10 years: No If all of the above answers are "NO", then may proceed with Cephalosporin use.    Consultations:  [facility]  Procedures/Studies: Dg Chest 2 View  Result Date: 02/21/2017 CLINICAL DATA:  Short of breath and fever for 3 days. EXAM: CHEST  2 VIEW COMPARISON:  04/11/2016 FINDINGS: Lateral view degraded by patient arm position. Both views are degraded by patient body habitus. Midline trachea. Cardiomegaly, accentuated by low lung volumes on the frontal. Mediastinal contours otherwise within normal limits. No pleural effusion or pneumothorax. New pulmonary interstitial prominence, accentuated by relatively decreased lung volumes. IMPRESSION: Decreased sensitivity and specificity exam due to technique related factors, as described above. Diminished lung volumes. Cardiomegaly with development of mild pulmonary interstitial prominence. Although this could be partially due to low lung volumes, pulmonary venous congestion or viral/atypical bacterial pneumonia cannot be excluded. Electronically Signed   By:  Abigail Miyamoto M.D.   On: 02/21/2017 15:25   Ct Angio Chest Pe W/cm &/or Wo Cm  Result Date: 02/21/2017 CLINICAL DATA:  Shortness of breath and fever for 2 days. EXAM: CT ANGIOGRAPHY CHEST WITH CONTRAST TECHNIQUE: Multidetector CT imaging of the chest was performed using the standard protocol during bolus administration of intravenous contrast. Multiplanar CT image reconstructions and MIPs were obtained to evaluate the vascular anatomy. CONTRAST:  100 mL of Isovue 370 COMPARISON:  Chest x-ray from earlier today FINDINGS: Cardiovascular: Cardiomegaly is noted. The thoracic aorta demonstrates no aneurysm or dissection. Minimal atherosclerotic change. The main pulmonary artery measures 4.8 cm consistent with pulmonary hypertension. There is mild stairstep artifact in the left base. Taking this into account, no pulmonary emboli identified. Mediastinum/Nodes: A tiny left effusion is seen. No right-sided pleural effusion or pericardial effusion. Adenopathy is seen in the mediastinum and hila. A representative right paratracheal node on series 4, image 30 measures 15 mm and a representative left hilar node measures 17 mm on image 53. Lungs/Pleura: Central airways are normal. No pneumothorax. Scattered atelectasis in the lungs. Scattered regions of mild air trapping. No suspicious nodules or masses. No focal infiltrates. Upper Abdomen: A gastric band is in place. No other abnormalities seen in the upper abdomen. Musculoskeletal: No chest wall abnormality. No acute or significant osseous findings. Review of the MIP images confirms the above findings. IMPRESSION: 1. No pulmonary emboli. 2. Adenopathy is seen in the mediastinum and hila. This is nonspecific and could be reactive, inflammatory, or neoplastic. Recommend clinical correlation. Recommend short-term follow-up to ensure resolution. 3. Pulmonary arterial hypertension. 4. Cardiomegaly. 5. Mild atherosclerotic change in the thoracic aorta. 6. No other abnormalities.  Aortic Atherosclerosis (ICD10-I70.0). Electronically Signed   By: Dorise Bullion III M.D   On: 02/21/2017 20:49    ECHOCARDIOGRAM Study Conclusions  - Left ventricle: The cavity size was normal. Wall thickness was   increased in a pattern of severe LVH. Systolic function was   normal. The estimated ejection fraction was in the range of 50%   to 55%. Wall motion was normal; there were no regional wall   motion  abnormalities. Features are consistent with a pseudonormal   left ventricular filling pattern, with concomitant abnormal   relaxation and increased filling pressure (grade 2 diastolic   dysfunction). - Aortic valve: There was trivial regurgitation. - Aortic root: The aortic root was mildly dilated. - Mitral valve: Calcified annulus. Mildly thickened leaflets .   There was mild systolic anterior motion of the chordal   structures. There was mild regurgitation. - Left atrium: The atrium was severely dilated.  Impressions:  - Normal LV systolic function; severe LVH; moderate diastolic   dysfunction; sclerotic aortic valve with trace AI; mild MR;   severe LAE; mild TR.  Subjective: Seen and examined and no SOB, CP or lightheadedness. No other complaints or concerns and ready to go home.  Discharge Exam: Vitals:   02/24/17 0628 02/24/17 1035  BP: (!) 147/89 (!) 142/94  Pulse: (!) 53 (!) 56  Resp: 18   Temp: 97.7 F (36.5 C) 99 F (37.2 C)   Vitals:   02/23/17 1142 02/23/17 1956 02/24/17 0628 02/24/17 1035  BP: (!) 147/91 (!) 133/91 (!) 147/89 (!) 142/94  Pulse: (!) 50 (!) 53 (!) 53 (!) 56  Resp: 18 18 18    Temp: 98.4 F (36.9 C) 98.7 F (37.1 C) 97.7 F (36.5 C) 99 F (37.2 C)  TempSrc: Oral Oral Oral Oral  SpO2: 99% 99% 100% 100%  Weight:   (!) 156.1 kg (344 lb 1.6 oz)   Height:       General: Pt is alert, awake, not in acute distress Cardiovascular: Slightly bradycardic rate, S1/S2 +, no rubs, no gallops Respiratory: CTA bilaterally, no wheezing, no  rhonchi; Patient was not tachypenic or using any accessory muscles to breathe Abdominal: Soft, NT, ND, bowel sounds + Extremities: no LE edema, no cyanosis  The results of significant diagnostics from this hospitalization (including imaging, microbiology, ancillary and laboratory) are listed below for reference.    Microbiology: No results found for this or any previous visit (from the past 240 hour(s)).   Labs: BNP (last 3 results)  Recent Labs  02/21/17 1530  BNP 782.9*   Basic Metabolic Panel:  Recent Labs Lab 02/21/17 1530 02/22/17 0419 02/23/17 0545 02/24/17 0554  NA 137 138 138 138  K 3.6 3.4* 4.2 4.2  CL 100* 102 105 104  CO2 27 26 23 26   GLUCOSE 102* 149* 101* 97  BUN 16 18 20 20   CREATININE 0.98 0.99 0.95 0.91  CALCIUM 9.5 9.4 8.7* 8.8*  MG  --  2.1 2.3 2.3  PHOS  --   --  3.1 2.4*   Liver Function Tests:  Recent Labs Lab 02/21/17 1530 02/22/17 0419 02/23/17 0545 02/24/17 0554  AST 71* 44* 35 29  ALT 65* 55 54 48  ALKPHOS 99 90 82 78  BILITOT 1.4* 1.1 0.5 0.8  PROT 7.6 7.1 6.6 6.5  ALBUMIN 3.9 3.5 3.4* 3.4*   No results for input(s): LIPASE, AMYLASE in the last 168 hours. No results for input(s): AMMONIA in the last 168 hours. CBC:  Recent Labs Lab 02/21/17 1530 02/22/17 0419 02/23/17 0545 02/24/17 0554  WBC 10.8* 9.2 8.6 8.4  NEUTROABS 9.8*  --  6.1 5.3  HGB 13.7 13.8 14.1 14.3  HCT 40.4 43.0 42.9 44.4  MCV 89.0 91.3 91.1 92.5  PLT 173 183 188 208   Cardiac Enzymes:  Recent Labs Lab 02/21/17 1918 02/22/17 0419 02/22/17 1053 02/22/17 1856 02/23/17 0058  TROPONINI 0.05* 0.06* 0.05* 0.04* 0.04*   BNP: Invalid input(s):  POCBNP CBG: No results for input(s): GLUCAP in the last 168 hours. D-Dimer  Recent Labs  02/21/17 1530  DDIMER 0.60*   Hgb A1c  Recent Labs  02/23/17 0545  HGBA1C 5.8*   Lipid Profile  Recent Labs  02/23/17 0545  CHOL 124  HDL 51  LDLCALC 58  TRIG 73  CHOLHDL 2.4   Thyroid function  studies  Recent Labs  02/23/17 0545  TSH 1.222   Anemia work up No results for input(s): VITAMINB12, FOLATE, FERRITIN, TIBC, IRON, RETICCTPCT in the last 72 hours. Urinalysis    Component Value Date/Time   COLORURINE STRAW (A) 02/22/2017 1132   APPEARANCEUR CLEAR 02/22/2017 1132   LABSPEC 1.005 02/22/2017 1132   PHURINE 6.0 02/22/2017 1132   GLUCOSEU NEGATIVE 02/22/2017 1132   HGBUR NEGATIVE 02/22/2017 1132   HGBUR negative 07/27/2009 1017   BILIRUBINUR NEGATIVE 02/22/2017 1132   BILIRUBINUR Neg 07/22/2016 0756   KETONESUR NEGATIVE 02/22/2017 1132   PROTEINUR NEGATIVE 02/22/2017 1132   UROBILINOGEN negative 07/22/2016 0756   UROBILINOGEN 0.2 07/27/2009 1017   NITRITE NEGATIVE 02/22/2017 1132   LEUKOCYTESUR NEGATIVE 02/22/2017 1132   Sepsis Labs Invalid input(s): PROCALCITONIN,  WBC,  LACTICIDVEN Microbiology No results found for this or any previous visit (from the past 240 hour(s)).  Time coordinating discharge: 35 minutes  SIGNED:  Kerney Elbe, DO [facility] 02/24/2017, 1:41 PM Pager [phone]  If 7PM-7AM, please contact night-coverage [email or web address] Password [email or web address]

## 2017-02-24 NOTE — Progress Notes (Signed)
MD returned call stated he will place PO order Called pharmacy and they stated they will tube up pt PO phosphorus asap for pt discharge   Chad Lyons

## 2017-02-24 NOTE — Progress Notes (Signed)
Paged MD regarding IVPB sodium phosphate infusing over 6 hours and MD placed discharge order. Awaiting call back for clarification   Aren Pryde

## 2017-02-24 NOTE — Progress Notes (Signed)
Pt IV discontinued, catheter intact and telemetry removed. Pt discharge education provided at bedside with pt and pt wife. Pt has all belongings. Pt discharged via wheelchair with volunteer services  [name] [name]

## 2017-02-24 NOTE — Progress Notes (Signed)
Paged pharmacy regarding pt discharge orders. Pt receiving IV phosphorus. Pharmacy stated she will page MD  Casson Catena

## 2017-02-25 ENCOUNTER — Telehealth: Payer: Self-pay

## 2017-02-25 NOTE — Telephone Encounter (Signed)
LMTCB

## 2017-02-26 NOTE — Telephone Encounter (Signed)
D/C 02/24/17 To: home  Spoke with pt and he states that he is doing very well. He is continuing to lose weight and states that he feels better than he has in some time. He is taking all medications as directed. He is monitoring his weight daily. He has no questions or concerns at this time.   Appt scheduled with Dr. Raliegh Ip 02/27/17, pt aware.   Transition Care Management Follow-up Telephone Call  How have you been since you were released from the hospital? Very good   Do you understand why you were in the hospital? yes   Do you understand the discharge instrcutions? yes  Items Reviewed:  Medications reviewed: yes  Allergies reviewed: yes  Dietary changes reviewed: yes  Referrals reviewed: yes   Functional Questionnaire:   Activities of Daily Living (ADLs):   He states they are independent in the following: ambulation, bathing and hygiene, feeding, continence, grooming, toileting and dressing States they require assistance with the following: none   Any transportation issues/concerns?: no   Any patient concerns? no   Confirmed importance and date/time of follow-up visits scheduled: yes   Confirmed with patient if condition begins to worsen call PCP or go to the ER.  Patient was given the Call-a-Nurse line [phone]: yes

## 2017-02-26 NOTE — Telephone Encounter (Signed)
LMTCB

## 2017-02-27 ENCOUNTER — Ambulatory Visit (INDEPENDENT_AMBULATORY_CARE_PROVIDER_SITE_OTHER): Payer: BLUE CROSS/BLUE SHIELD | Admitting: Internal Medicine

## 2017-02-27 ENCOUNTER — Encounter: Payer: Self-pay | Admitting: Internal Medicine

## 2017-02-27 VITALS — BP 124/82 | HR 68 | Temp 98.5°F | Ht 74.0 in | Wt 345.0 lb

## 2017-02-27 DIAGNOSIS — I517 Cardiomegaly: Secondary | ICD-10-CM | POA: Diagnosis not present

## 2017-02-27 DIAGNOSIS — I10 Essential (primary) hypertension: Secondary | ICD-10-CM

## 2017-02-27 DIAGNOSIS — I5031 Acute diastolic (congestive) heart failure: Secondary | ICD-10-CM | POA: Diagnosis not present

## 2017-02-27 DIAGNOSIS — R590 Localized enlarged lymph nodes: Secondary | ICD-10-CM | POA: Diagnosis not present

## 2017-02-27 NOTE — Progress Notes (Signed)
Subjective:    Patient ID: Chad Lyons, male    DOB: 1966-04-04, 51 y.o.   MRN: [id number]  HPI  Admit date: 02/21/2017 Discharge date: 02/24/2017   Recommendations for Outpatient Follow-up:  1. Follow up with PCP in 1-2 weeks on Friday 02/27/17 at 3:45 pm 2. Follow up with Cardiology within 7-10 days for Heart Failure Follow Up and for HOCM Evaluation with a Cardiac MRI 3. Follow up with Repeat Imaging of Nonspecific Adenopathy in the Mediastinum and Hila seen on CTA 4. Please obtain CMP/CBC, Mag, Phos within one week  Discharge Diagnoses:  Principal Problem:   CHF (congestive heart failure) (HCC) Active Problems:   OBESITY, MORBID   Essential hypertension   OSA (obstructive sleep apnea)   Elevated troponin   Abnormal LFTs   Acute CHF (congestive heart failure) (HCC)   Pulmonary HTN (HCC)   Mediastinal adenopathy   Hypokalemia   Acute diastolic CHF (congestive heart failure) (HCC)   LVH (left ventricular hypertrophy)  Acute Decompensation of Chronic Diastolic Grade 2 CHF with EF of 53-26%,   51 year old patient who is seen in follow-up today after a hospital discharge.  3 days prior. Hospital records reviewed He was treated for diastolic heart failure.  He has done well since his discharge.  3 days ago and his weight is down an additional 2 and a half pounds.  No pulmonary complaints.  No further peripheral edema or orthopnea.  No DOE.  He is scheduled for cardiology follow-up next week.  2-D echocardiogram revealed severe LVH and worrisome for hypertrophic cardiomyopathy.  Chest CT revealed mediastinal lymphadenopathy  He has a long history of essential hypertension which has been well-controlled  BP Readings from Last 3 Encounters:  02/27/17 124/82  02/24/17 (!) 142/94  01/09/17 122/84   Past Medical History:  Diagnosis Date  . Diverticulitis    HX OF  . EKG, abnormal    HX OF LEFT ANTERIO FASCICULAR BLOCK ON 02-15-15 EKG  . GERD (gastroesophageal  reflux disease)   . Heart murmur    NOW RESOLVED  . Hypertension   . OSA (obstructive sleep apnea) 11/27/2016  . Ventral incisional hernia      Social History   Social History  . Marital status: Married    Spouse name: N/A  . Number of children: N/A  . Years of education: N/A   Occupational History  . Sales    Social History Main Topics  . Smoking status: Current Every Day Smoker    Types: Cigars, Cigarettes    Last attempt to quit: 10/11/2004  . Smokeless tobacco: Never Used     Comment: Current Cigars 1 every other day -----QUIT cigarettes in 2006>> 1/2PPD x 15 years  . Alcohol use 1.8 - 2.4 oz/week    3 - 4 Shots of liquor per week     Comment: socially;  . Drug use: No  . Sexual activity: Not on file     Comment: still smokes cigars   Other Topics Concern  . Not on file   Social History Narrative  . No narrative on file    Past Surgical History:  Procedure Laterality Date  . COLON SURGERY  2006   colon resection for polyps  . COLONOSCOPY WITH PROPOFOL N/A 10/31/2016   Procedure: COLONOSCOPY WITH PROPOFOL;  Surgeon: Mauri Pole, MD;  Location: [facility];  Service: Endoscopy;  Laterality: N/A;  . HIATAL HERNIA REPAIR  1991  . INSERTION OF MESH N/A 02/20/2015   Procedure:  INSERTION OF MESH;  Surgeon: Excell Seltzer, MD;  Location: [facility];  Service: General;  Laterality: N/A;  . LAPAROSCOPIC GASTRIC BANDING  2011  . VENTRAL HERNIA REPAIR N/A 02/20/2015   Procedure: OPEN REPAIR VENTRAL INCISIONAL HERNIA;  Surgeon: Excell Seltzer, MD;  Location: [facility];  Service: General;  Laterality: N/A;  . WISDOM TOOTH EXTRACTION      Family History  Problem Relation Age of Onset  . Heart disease Mother   . Heart disease Maternal Aunt   . Cancer Maternal Uncle        pancreatic  . Heart disease Maternal Uncle   . Rheum arthritis Maternal Grandmother   . Colon cancer Neg Hx     Allergies  Allergen Reactions  . Hydromet [Hydrocodone-Homatropine] Itching  .  Iohexol Hives and Itching    PT DEVELOPS ITCHING AND HIVES SEVERAL HOURS AFTER INJECTION OF OMNIPAQUE   . Penicillins Itching    Has patient had a PCN reaction causing immediate rash, facial/tongue/throat swelling, SOB or lightheadedness with hypotension: Yes Has patient had a PCN reaction causing severe rash involving mucus membranes or skin necrosis: No Has patient had a PCN reaction that required hospitalization: No Has patient had a PCN reaction occurring within the last 10 years: No If all of the above answers are "NO", then may proceed with Cephalosporin use.     Current Outpatient Prescriptions on File Prior to Visit  Medication Sig Dispense Refill  . ALPRAZolam (XANAX) 0.5 MG tablet Take 1 tablet (0.5 mg total) by mouth daily as needed. (Patient taking differently: Take 0.5 mg by mouth daily as needed for anxiety. ) 90 tablet 1  . furosemide (LASIX) 40 MG tablet Take 1 tablet (40 mg total) by mouth 2 (two) times daily. 60 tablet 0  . losartan (COZAAR) 100 MG tablet Take 1 tablet (100 mg total) by mouth daily. 30 tablet 0  . potassium chloride SA (K-DUR,KLOR-CON) 20 MEQ tablet Take 1 tablet (20 mEq total) by mouth daily. 30 tablet 0  . triamcinolone cream (KENALOG) 0.1 % APPLY TOPICALLY TWICE A DAY AS DIRECTED 30 g 0  . verapamil (CALAN-SR) 240 MG CR tablet TAKE 1 TABLET AT BEDTIME (PHYSICAL SET UP FOR 06/30/16) (Patient taking differently: Take 240 mg by mouth once a day) 90 tablet 1  . phosphorus (K PHOS NEUTRAL) 155-852-130 MG tablet Take 2 tablets (500 mg total) by mouth 3 (three) times daily. (Patient not taking: Reported on 02/27/2017) 3 tablet 0   No current facility-administered medications on file prior to visit.     BP 124/82 (BP Location: Left Arm, Patient Position: Sitting, Cuff Size: Large)   Pulse 68   Temp 98.5 F (36.9 C) (Oral)   Ht 6\' 2"  (1.88 m)   Wt (!) 345 lb (156.5 kg)   SpO2 98%   BMI 44.30 kg/m     Review of Systems  Constitutional: Positive for  unexpected weight change.  Respiratory: Positive for shortness of breath.   Cardiovascular: Positive for leg swelling.       Objective:   Physical Exam  Constitutional: He is oriented to person, place, and time. He appears well-developed.  Wt Readings from Last 3 Encounters: 02/27/17 : (!) 345 lb (156.5 kg) 02/24/17 : (!) 344 lb 1.6 oz (156.1 kg) 01/09/17 : (!) 351 lb (159.2 kg)  Blood pressure 120/76  HENT:  Head: Normocephalic.  Right Ear: External ear normal.  Left Ear: External ear normal.  Eyes: Conjunctivae and EOM are normal.  Neck:  Normal range of motion.  Cardiovascular: Normal rate and normal heart sounds.   Grade 1/6 systolic murmur at the left sternal border No increase of heart murmur with standing  Pulmonary/Chest: Breath sounds normal.  Abdominal: Bowel sounds are normal.  Musculoskeletal: Normal range of motion. He exhibits no edema or tenderness.  Neurological: He is alert and oriented to person, place, and time.  Psychiatric: He has a normal mood and affect. His behavior is normal.          Assessment & Plan:   Status post diastolic heart failure.  Patient appears euvolemic.  No change in therapy.  Patient is scheduled for cardiology follow-up next week.  We'll check electrolytes at that time.  He is to be scheduled for a cardiac MRI to further evaluate possible HOCM.  Mediastinal lymphadenopathy noted on chest CT.  Will consider follow-up in 3 months.  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  Continue CPAP and efforts at weight loss  Severe LVH.  Will maintain good blood pressure control  [id number],PETER Pilar Plate

## 2017-02-27 NOTE — Patient Instructions (Addendum)
Limit your sodium (Salt) intake  Weigh yourself daily and report any significant weight gain  Cardiology follow-up as scheduled  Return here in 3 months for follow-up chest CT scan   Heart-Healthy Eating Plan Heart-healthy meal planning includes:  Limiting unhealthy fats.  Increasing healthy fats.  Making other small dietary changes.  You may need to talk with your doctor or a diet specialist (dietitian) to create an eating plan that is right for you. What types of fat should I choose?  Choose healthy fats. These include olive oil and canola oil, flaxseeds, walnuts, almonds, and seeds.  Eat more omega-3 fats. These include salmon, mackerel, sardines, tuna, flaxseed oil, and ground flaxseeds. Try to eat fish at least twice each week.  Limit saturated fats. ? Saturated fats are often found in animal products, such as meats, butter, and cream. ? Plant sources of saturated fats include palm oil, palm kernel oil, and coconut oil.  Avoid foods with partially hydrogenated oils in them. These include stick margarine, some tub margarines, cookies, crackers, and other baked goods. These contain trans fats. What general guidelines do I need to follow?  Check food labels carefully. Identify foods with trans fats or high amounts of saturated fat.  Fill one half of your plate with vegetables and green salads. Eat 4-5 servings of vegetables per day. A serving of vegetables is: ? 1 cup of raw leafy vegetables. ?  cup of raw or cooked cut-up vegetables. ?  cup of vegetable juice.  Fill one fourth of your plate with whole grains. Look for the word "whole" as the first word in the ingredient list.  Fill one fourth of your plate with lean protein foods.  Eat 4-5 servings of fruit per day. A serving of fruit is: ? One medium whole fruit. ?  cup of dried fruit. ?  cup of fresh, frozen, or canned fruit. ?  cup of 100% fruit juice.  Eat more foods that contain soluble fiber. These  include apples, broccoli, carrots, beans, peas, and barley. Try to get 20-30 g of fiber per day.  Eat more home-cooked food. Eat less restaurant, buffet, and fast food.  Limit or avoid alcohol.  Limit foods high in starch and sugar.  Avoid fried foods.  Avoid frying your food. Try baking, boiling, grilling, or broiling it instead. You can also reduce fat by: ? Removing the skin from poultry. ? Removing all visible fats from meats. ? Skimming the fat off of stews, soups, and gravies before serving them. ? Steaming vegetables in water or broth.  Lose weight if you are overweight.  Eat 4-5 servings of nuts, legumes, and seeds per week: ? One serving of dried beans or legumes equals  cup after being cooked. ? One serving of nuts equals 1 ounces. ? One serving of seeds equals  ounce or one tablespoon.  You may need to keep track of how much salt or sodium you eat. This is especially true if you have high blood pressure. Talk with your doctor or dietitian to get more information. What foods can I eat? Grains Breads, including [place], white, pita, wheat, raisin, rye, oatmeal, and [place]. Tortillas that are neither fried nor made with lard or trans fat. Low-fat rolls, including hotdog and hamburger buns and English muffins. Biscuits. Muffins. Waffles. Pancakes. Light popcorn. Whole-grain cereals. Flatbread. Melba toast. Pretzels. Breadsticks. Rusks. Low-fat snacks. Low-fat crackers, including oyster, saltine, matzo, graham, animal, and rye. Rice and pasta, including brown rice and pastas that are made  with whole wheat. Vegetables All vegetables. Fruits All fruits, but limit coconut. Meats and Other Protein Sources Lean, well-trimmed beef, veal, pork, and lamb. Chicken and [place] without skin. All fish and shellfish. Wild duck, rabbit, pheasant, and venison. Egg whites or low-cholesterol egg substitutes. Dried beans, peas, lentils, and tofu. Seeds and most nuts. Dairy Low-fat or nonfat  cheeses, including ricotta, string, and mozzarella. Skim or 1% milk that is liquid, powdered, or evaporated. Buttermilk that is made with low-fat milk. Nonfat or low-fat yogurt. Beverages Mineral water. Diet carbonated beverages. Sweets and Desserts Sherbets and fruit ices. Honey, jam, marmalade, jelly, and syrups. Meringues and gelatins. Pure sugar candy, such as hard candy, jelly beans, gumdrops, mints, marshmallows, and small amounts of dark chocolate. [employer] Eat all sweets and desserts in moderation. Fats and Oils Nonhydrogenated (trans-free) margarines. Vegetable oils, including soybean, sesame, sunflower, olive, peanut, safflower, corn, canola, and cottonseed. Salad dressings or mayonnaise made with a vegetable oil. Limit added fats and oils that you use for cooking, baking, salads, and as spreads. Other Cocoa powder. Coffee and tea. All seasonings and condiments. The items listed above may not be a complete list of recommended foods or beverages. Contact your dietitian for more options. What foods are not recommended? Grains Breads that are made with saturated or trans fats, oils, or whole milk. Croissants. Butter rolls. Cheese breads. Sweet rolls. Donuts. Buttered popcorn. Chow mein noodles. High-fat crackers, such as cheese or butter crackers. Meats and Other Protein Sources Fatty meats, such as hotdogs, short ribs, sausage, spareribs, bacon, rib eye roast or steak, and mutton. High-fat deli meats, such as salami and bologna. Caviar. Domestic duck and goose. Organ meats, such as kidney, liver, sweetbreads, and heart. Dairy Cream, sour cream, cream cheese, and creamed cottage cheese. Whole-milk cheeses, including blue (bleu), Monte[place] Jack, [place], [place], American, [place], Swiss, cheddar, [place], and [place]. Whole or 2% milk that is liquid, evaporated, or condensed. Whole buttermilk. Cream sauce or high-fat cheese sauce. Yogurt that is made from whole milk. Beverages Regular  sodas and juice drinks with added sugar. Sweets and Desserts Frosting. Pudding. Cookies. Cakes other than angel food cake. Candy that has milk chocolate or white chocolate, hydrogenated fat, butter, coconut, or unknown ingredients. Buttered syrups. Full-fat ice cream or ice cream drinks. Fats and Oils Gravy that has suet, meat fat, or shortening. Cocoa butter, hydrogenated oils, palm oil, coconut oil, palm kernel oil. These can often be found in baked products, candy, fried foods, nondairy creamers, and whipped toppings. Solid fats and shortenings, including bacon fat, salt pork, lard, and butter. Nondairy cream substitutes, such as coffee creamers and sour cream substitutes. Salad dressings that are made of unknown oils, cheese, or sour cream. The items listed above may not be a complete list of foods and beverages to avoid. Contact your dietitian for more information. This information is not intended to replace advice given to you by your health care provider. Make sure you discuss any questions you have with your health care provider. Document Released: 02/03/2012 Document Revised: 01/10/2016 Document Reviewed: 01/26/2014 Elsevier Interactive Patient Education  2018 [place].  Heart Failure Heart failure means your heart has trouble pumping blood. This makes it hard for your body to work well. Heart failure is usually a long-term (chronic) condition. You must take good care of yourself and follow your doctor's treatment plan. Follow these instructions at home:  Take your heart medicine as told by your doctor. ? Do not stop taking medicine unless your doctor  tells you to. ? Do not skip any dose of medicine. ? Refill your medicines before they run out. ? Take other medicines only as told by your doctor or pharmacist.  Stay active if told by your doctor. The elderly and people with severe heart failure should talk with a doctor about physical activity.  Eat heart-healthy foods. Choose  foods that are without trans fat and are low in saturated fat, cholesterol, and salt (sodium). This includes fresh or frozen fruits and vegetables, fish, lean meats, fat-free or low-fat dairy foods, whole grains, and high-fiber foods. Lentils and dried peas and beans (legumes) are also good choices.  Limit salt if told by your doctor.  Cook in a healthy way. Roast, grill, broil, bake, poach, steam, or stir-fry foods.  Limit fluids as told by your doctor.  Weigh yourself every morning. Do this after you pee (urinate) and before you eat breakfast. Write down your weight to give to your doctor.  Take your blood pressure and write it down if your doctor tells you to.  Ask your doctor how to check your pulse. Check your pulse as told.  Lose weight if told by your doctor.  Stop smoking or chewing tobacco. Do not use gum or patches that help you quit without your doctor's approval.  Schedule and go to doctor visits as told.  Nonpregnant women should have no more than 1 drink a day. Men should have no more than 2 drinks a day. Talk to your doctor about drinking alcohol.  Stop illegal drug use.  Stay current with shots (immunizations).  Manage your health conditions as told by your doctor.  Learn to manage your stress.  Rest when you are tired.  If it is really hot outside: ? Avoid intense activities. ? Use air conditioning or fans, or get in a cooler place. ? Avoid caffeine and alcohol. ? Wear loose-fitting, lightweight, and light-colored clothing.  If it is really cold outside: ? Avoid intense activities. ? Layer your clothing. ? Wear mittens or gloves, a hat, and a scarf when going outside. ? Avoid alcohol.  Learn about heart failure and get support as needed.  Get help to maintain or improve your quality of life and your ability to care for yourself as needed. Contact a doctor if:  You gain weight quickly.  You are more short of breath than usual.  You cannot do your  normal activities.  You tire easily.  You cough more than normal, especially with activity.  You have any or more puffiness (swelling) in areas such as your hands, feet, ankles, or belly (abdomen).  You cannot sleep because it is hard to breathe.  You feel like your heart is beating fast (palpitations).  You get dizzy or light-headed when you stand up. Get help right away if:  You have trouble breathing.  There is a change in mental status, such as becoming less alert or not being able to focus.  You have chest pain or discomfort.  You faint. This information is not intended to replace advice given to you by your health care provider. Make sure you discuss any questions you have with your health care provider. Document Released: 05/13/2008 Document Revised: 01/10/2016 Document Reviewed: 09/20/2012 Elsevier Interactive Patient Education  2017 [employer].

## 2017-03-25 ENCOUNTER — Other Ambulatory Visit: Payer: Self-pay

## 2017-03-25 ENCOUNTER — Telehealth: Payer: Self-pay | Admitting: Internal Medicine

## 2017-03-25 DIAGNOSIS — G4733 Obstructive sleep apnea (adult) (pediatric): Secondary | ICD-10-CM | POA: Diagnosis not present

## 2017-03-25 MED ORDER — FUROSEMIDE 40 MG PO TABS: 40.0000 mg | ORAL_TABLET | Freq: Two times a day (BID) | ORAL | 1 refills | Status: DC

## 2017-03-25 NOTE — Telephone Encounter (Signed)
Pt request refill  furosemide (LASIX) 40 MG tablet  [facility] - [place] - [address]  Pt is out. [facility] states they have sent 3 X. I do not see where we got anything form them.

## 2017-03-25 NOTE — Telephone Encounter (Signed)
Rx has been e scribed to patient pharmacy.#[id number] with 1 refill

## 2017-03-30 ENCOUNTER — Telehealth: Payer: Self-pay | Admitting: Internal Medicine

## 2017-03-30 ENCOUNTER — Other Ambulatory Visit: Payer: Self-pay | Admitting: Internal Medicine

## 2017-03-30 MED ORDER — FUROSEMIDE 40 MG PO TABS: 40.0000 mg | ORAL_TABLET | Freq: Two times a day (BID) | ORAL | 3 refills | Status: DC

## 2017-03-30 MED ORDER — LOSARTAN POTASSIUM 100 MG PO TABS: 100.0000 mg | ORAL_TABLET | Freq: Every day | ORAL | 0 refills | Status: DC

## 2017-03-30 MED ORDER — POTASSIUM CHLORIDE CRYS ER 20 MEQ PO TBCR: 20.0000 meq | EXTENDED_RELEASE_TABLET | Freq: Every day | ORAL | 0 refills | Status: DC

## 2017-03-30 MED ORDER — LOSARTAN POTASSIUM 100 MG PO TABS: 100.0000 mg | ORAL_TABLET | Freq: Every day | ORAL | 3 refills | Status: DC

## 2017-03-30 MED ORDER — POTASSIUM CHLORIDE CRYS ER 20 MEQ PO TBCR: 20.0000 meq | EXTENDED_RELEASE_TABLET | Freq: Every day | ORAL | 3 refills | Status: DC

## 2017-03-30 MED ORDER — ALPRAZOLAM 0.5 MG PO TABS: 0.5000 mg | ORAL_TABLET | Freq: Every day | ORAL | 2 refills | Status: DC | PRN

## 2017-03-30 NOTE — Telephone Encounter (Signed)
Medications were refilled as requested by pt.

## 2017-03-30 NOTE — Telephone Encounter (Signed)
Pt request refill   losartan (COZAAR) 100 MG tablet potassium chloride SA (K-DUR,KLOR-CON) 20 MEQ tablet  30 day only to   [place], [address]  Pt states he discussed refills at his hospital fu on 7/13 that [facility]. However, now he is out of these 2 and going out of town in the am. Please send these 2 asap.  Then Refill  ALPRAZolam (XANAX) 0.5 MG tablet furosemide (LASIX) 40 MG tablet losartan (COZAAR) 100 MG tablet potassium chloride SA (K-DUR,KLOR-CON) 20 MEQ tablet  To   [facility], [place]

## 2017-04-10 DIAGNOSIS — Z4651 Encounter for fitting and adjustment of gastric lap band: Secondary | ICD-10-CM | POA: Diagnosis not present

## 2017-04-25 DIAGNOSIS — G4733 Obstructive sleep apnea (adult) (pediatric): Secondary | ICD-10-CM | POA: Diagnosis not present

## 2017-05-07 ENCOUNTER — Encounter: Payer: Self-pay | Admitting: Internal Medicine

## 2017-05-08 ENCOUNTER — Ambulatory Visit (INDEPENDENT_AMBULATORY_CARE_PROVIDER_SITE_OTHER): Payer: BLUE CROSS/BLUE SHIELD | Admitting: Pulmonary Disease

## 2017-05-08 ENCOUNTER — Encounter: Payer: Self-pay | Admitting: Pulmonary Disease

## 2017-05-08 VITALS — BP 122/78 | HR 64 | Ht 74.0 in | Wt 342.2 lb

## 2017-05-08 DIAGNOSIS — G4733 Obstructive sleep apnea (adult) (pediatric): Secondary | ICD-10-CM | POA: Diagnosis not present

## 2017-05-08 DIAGNOSIS — R590 Localized enlarged lymph nodes: Secondary | ICD-10-CM

## 2017-05-08 DIAGNOSIS — Z6841 Body Mass Index (BMI) 40.0 and over, adult: Secondary | ICD-10-CM

## 2017-05-08 NOTE — Progress Notes (Signed)
Current Outpatient Prescriptions on File Prior to Visit  Medication Sig  . ALPRAZolam (XANAX) 0.5 MG tablet Take 1 tablet (0.5 mg total) by mouth daily as needed.  . furosemide (LASIX) 40 MG tablet Take 1 tablet (40 mg total) by mouth 2 (two) times daily.  [name] losartan (COZAAR) 100 MG tablet Take 1 tablet (100 mg total) by mouth daily.  . phosphorus (K PHOS NEUTRAL) 155-852-130 MG tablet Take 2 tablets (500 mg total) by mouth 3 (three) times daily.  . potassium chloride SA (K-DUR,KLOR-CON) 20 MEQ tablet Take 1 tablet (20 mEq total) by mouth daily.  [name] triamcinolone cream (KENALOG) 0.1 % APPLY TOPICALLY TWICE A DAY AS DIRECTED  . verapamil (CALAN-SR) 240 MG CR tablet TAKE 1 TABLET AT BEDTIME (PHYSICAL SET UP FOR 06/30/16) (Patient taking differently: Take 240 mg by mouth once a day)   No current facility-administered medications on file prior to visit.      Chief Complaint  Patient presents with  . Follow-up    Pt is sleeping better, having some issues with the mask with the seal and leaking. CPAP- DME is [facility] they just sent pt new mask and supplies. Overall doing well.      Sleep tests HST 11/25/16 >> AHI 54.9, SaO2 low 68% Auto CPAP 04/08/17 to 05/07/17 >> used on 26 of 30 nights with average 8 hrs 5 min.  Average AHI 2.5 with median CPAP 11 and 95 th percentile CPAP 14 cm H2O  Pulmonary tests CT angio test 02/21/17 >> mediastinal/hilar LAN, enlarged pulmonary arteries, CM, atherosclerosis  Cardiac tests Echo 02/22/17 >> severe LVH, EF 50 to 55%, grade 2 DD  Past medical history GERD, HTN  Past surgical history, Family history, Social history, Allergies all reviewed.  Vital Signs BP 122/78 (BP Location: Left Arm, Cuff Size: Normal)   Pulse 64   Ht 6\' 2"  (1.88 m)   Wt (!) 342 lb 3.2 oz (155.2 kg)   SpO2 99%   BMI 43.94 kg/m   History of Present Illness Tylon Kemmerling is a 51 y.o. male with obstructive sleep apnea.  Since his last visit he had home sleep study.  This showed  severe OSA.  He was started on CPAP.  He is sleeping much better and no longer snoring.  Only issue is mask leak as his mask gets older.  He uses a full face mask.  He hasn't use any other mask type.  He was in hospital recently for diastolic CHF.  He had CT angio chest that showed enlarged lymph nodes.  He had hives after getting contrast.  Denies chest pain, cough, fever, sputum, swelling, dyspnea.  Physical Exam  General - No distress ENT - No sinus tenderness, no oral exudate, no LAN Cardiac - s1s2 regular, no murmur Chest - No wheeze/rales/dullness Back - No focal tenderness Abd - Soft, non-tender Ext - No edema Neuro - Normal strength Skin - No rashes Psych - normal mood, and behavior   Assessment/Plan  Obstructive sleep apnea. - he is compliant with CPAP and reports benefit from therapy - continue auto CPAP - discussed options to help limit mask leak  Obesity. - discussed importance of weight loss  Chronic diastolic CHF. - he will check with his PCP about getting cardiology follow up appointment  Mediastinal and hilar LAN on recent CT chest. - would like to do repeat CT with contrast but he has allergy to contrast - will arrange for CT chest w/o contrast and call him with  results   Patient Instructions  Can look up CPAP mask options at [email or web address] or similar web site  Follow up in 1 year    Chesley Mires, MD [facility] Pager:  [phone] 05/08/2017, 2:40 PM

## 2017-05-08 NOTE — Patient Instructions (Addendum)
Can look up CPAP mask options at [email or web address] or similar web site  Will arrange for CT chest without contrast  Follow up in 1 year

## 2017-05-22 ENCOUNTER — Ambulatory Visit (INDEPENDENT_AMBULATORY_CARE_PROVIDER_SITE_OTHER)
Admission: RE | Admit: 2017-05-22 | Discharge: 2017-05-22 | Disposition: A | Discharge status: Home / Self Care | Payer: BLUE CROSS/BLUE SHIELD | Source: Ambulatory Visit | Attending: Pulmonary Disease | Admitting: Pulmonary Disease

## 2017-05-22 DIAGNOSIS — R590 Localized enlarged lymph nodes: Secondary | ICD-10-CM | POA: Diagnosis not present

## 2017-05-22 DIAGNOSIS — R911 Solitary pulmonary nodule: Secondary | ICD-10-CM | POA: Diagnosis not present

## 2017-05-26 ENCOUNTER — Telehealth: Payer: Self-pay | Admitting: Pulmonary Disease

## 2017-05-26 DIAGNOSIS — R918 Other nonspecific abnormal finding of lung field: Secondary | ICD-10-CM

## 2017-05-26 NOTE — Telephone Encounter (Signed)
CT chest 05/22/17 >> decreased mediastinal and hilar LAN, new 6 mm nodule RML, scar in lingula   Left message.  Explained that adenopathy is better, but he has new RML nodule.  He has hx of smoking.  He needs follow up CT chest w/o contrast to monitor.  Advised him to call back with questions.

## 2017-06-01 ENCOUNTER — Encounter: Payer: Self-pay | Admitting: Internal Medicine

## 2017-06-01 ENCOUNTER — Ambulatory Visit (INDEPENDENT_AMBULATORY_CARE_PROVIDER_SITE_OTHER): Payer: BLUE CROSS/BLUE SHIELD | Admitting: Internal Medicine

## 2017-06-01 VITALS — BP 126/80 | HR 71 | Temp 98.7°F | Ht 74.0 in | Wt 335.8 lb

## 2017-06-01 DIAGNOSIS — Z23 Encounter for immunization: Secondary | ICD-10-CM | POA: Diagnosis not present

## 2017-06-01 DIAGNOSIS — I421 Obstructive hypertrophic cardiomyopathy: Secondary | ICD-10-CM

## 2017-06-01 DIAGNOSIS — I10 Essential (primary) hypertension: Secondary | ICD-10-CM | POA: Diagnosis not present

## 2017-06-01 DIAGNOSIS — I517 Cardiomegaly: Secondary | ICD-10-CM | POA: Diagnosis not present

## 2017-06-01 NOTE — Progress Notes (Signed)
Subjective:    Patient ID: Chad Lyons, male    DOB: 06/10/1966, 51 y.o.   MRN: [id number]  HPI 51 year old patient who has essential hypertension. He is followed by pulmonary medicine with OSA.  He has had a recent chest CT to evaluate mediastinal adenopathy.  This revealed improving lesions and is felt to be reactive lymph nodes.  The patient is a smoker that discontinued in 2006-still smokes cigars.  He was also noted have a 6 mm nonspecific nodule in the right middle lung field.  He is scheduled for follow-up chest CT in 6 months He was hospitalized 3 months ago for suspected diastolic heart failure.  Remains on diuretic therapy and continues to do well.  Denies any shortness of breath or peripheral edema He does have a history of hypertension and LVH  Past Medical History:  Diagnosis Date  . Diverticulitis    HX OF  . EKG, abnormal    HX OF LEFT ANTERIO FASCICULAR BLOCK ON 02-15-15 EKG  . GERD (gastroesophageal reflux disease)   . Heart murmur    NOW RESOLVED  . Hypertension   . OSA (obstructive sleep apnea) 11/27/2016  . Ventral incisional hernia      Social History   Social History  . Marital status: Married    Spouse name: N/A  . Number of children: N/A  . Years of education: N/A   Occupational History  . Sales    Social History Main Topics  . Smoking status: Current Every Day Smoker    Types: Cigars, Cigarettes    Last attempt to quit: 10/11/2004  . Smokeless tobacco: Never Used     Comment: Current Cigars 1 every other day -----QUIT cigarettes in 2006>> 1/2PPD x 15 years  . Alcohol use 1.8 - 2.4 oz/week    3 - 4 Shots of liquor per week     Comment: socially;  . Drug use: No  . Sexual activity: Not on file     Comment: still smokes cigars   Other Topics Concern  . Not on file   Social History Narrative  . No narrative on file    Past Surgical History:  Procedure Laterality Date  . COLON SURGERY  2006   colon resection for polyps  . COLONOSCOPY  WITH PROPOFOL N/A 10/31/2016   Procedure: COLONOSCOPY WITH PROPOFOL;  Surgeon: Mauri Pole, MD;  Location: [facility];  Service: Endoscopy;  Laterality: N/A;  . HIATAL HERNIA REPAIR  1991  . INSERTION OF MESH N/A 02/20/2015   Procedure: INSERTION OF MESH;  Surgeon: Excell Seltzer, MD;  Location: [facility];  Service: General;  Laterality: N/A;  . LAPAROSCOPIC GASTRIC BANDING  2011  . VENTRAL HERNIA REPAIR N/A 02/20/2015   Procedure: OPEN REPAIR VENTRAL INCISIONAL HERNIA;  Surgeon: Excell Seltzer, MD;  Location: [facility];  Service: General;  Laterality: N/A;  . WISDOM TOOTH EXTRACTION      Family History  Problem Relation Age of Onset  . Heart disease Mother   . Heart disease Maternal Aunt   . Cancer Maternal Uncle        pancreatic  . Heart disease Maternal Uncle   . Rheum arthritis Maternal Grandmother   . Colon cancer Neg Hx     Allergies  Allergen Reactions  . Hydromet [Hydrocodone-Homatropine] Itching  . Iohexol Hives and Itching    PT DEVELOPS ITCHING AND HIVES SEVERAL HOURS AFTER INJECTION OF OMNIPAQUE   . Penicillins Itching    Has patient had a PCN  reaction causing immediate rash, facial/tongue/throat swelling, SOB or lightheadedness with hypotension: Yes Has patient had a PCN reaction causing severe rash involving mucus membranes or skin necrosis: No Has patient had a PCN reaction that required hospitalization: No Has patient had a PCN reaction occurring within the last 10 years: No If all of the above answers are "NO", then may proceed with Cephalosporin use.     Current Outpatient Prescriptions on File Prior to Visit  Medication Sig Dispense Refill  . ALPRAZolam (XANAX) 0.5 MG tablet Take 1 tablet (0.5 mg total) by mouth daily as needed. 90 tablet 2  . furosemide (LASIX) 40 MG tablet Take 1 tablet (40 mg total) by mouth 2 (two) times daily. 180 tablet 3  . losartan (COZAAR) 100 MG tablet Take 1 tablet (100 mg total) by mouth daily. 90 tablet 3  . phosphorus (K  PHOS NEUTRAL) 155-852-130 MG tablet Take 2 tablets (500 mg total) by mouth 3 (three) times daily. 3 tablet 0  . potassium chloride SA (K-DUR,KLOR-CON) 20 MEQ tablet Take 1 tablet (20 mEq total) by mouth daily. 90 tablet 3  . triamcinolone cream (KENALOG) 0.1 % APPLY TOPICALLY TWICE A DAY AS DIRECTED 30 g 0  . verapamil (CALAN-SR) 240 MG CR tablet TAKE 1 TABLET AT BEDTIME (PHYSICAL SET UP FOR 06/30/16) (Patient taking differently: Take 240 mg by mouth once a day) 90 tablet 1   No current facility-administered medications on file prior to visit.     BP 126/80 (BP Location: Left Arm, Patient Position: Sitting, Cuff Size: Normal)   Pulse 71   Temp 98.7 F (37.1 C) (Oral)   Ht 6\' 2"  (1.88 m)   Wt (!) 335 lb 12.8 oz (152.3 kg)   SpO2 97%   BMI 43.11 kg/m      Review of Systems  Constitutional: Negative for appetite change, chills, fatigue and fever.  HENT: Negative for congestion, dental problem, ear pain, hearing loss, sore throat, tinnitus, trouble swallowing and voice change.   Eyes: Negative for pain, discharge and visual disturbance.  Respiratory: Negative for cough, chest tightness, wheezing and stridor.   Cardiovascular: Negative for chest pain, palpitations and leg swelling.  Gastrointestinal: Negative for abdominal distention, abdominal pain, blood in stool, constipation, diarrhea, nausea and vomiting.  Genitourinary: Negative for difficulty urinating, discharge, flank pain, genital sores, hematuria and urgency.  Musculoskeletal: Negative for arthralgias, back pain, gait problem, joint swelling, myalgias and neck stiffness.  Skin: Negative for rash.  Neurological: Negative for dizziness, syncope, speech difficulty, weakness, numbness and headaches.  Hematological: Negative for adenopathy. Does not bruise/bleed easily.  Psychiatric/Behavioral: Negative for behavioral problems and dysphoric mood. The patient is not nervous/anxious.        Objective:   Physical Exam    Constitutional: He is oriented to person, place, and time. He appears well-developed.  Weight 355 Blood pressure 124/78  HENT:  Head: Normocephalic.  Right Ear: External ear normal.  Left Ear: External ear normal.  Eyes: Conjunctivae and EOM are normal.  Neck: Normal range of motion.  Cardiovascular: Normal rate and normal heart sounds.   Pulmonary/Chest: Breath sounds normal. No respiratory distress. He has no wheezes. He has no rales.  Abdominal: Bowel sounds are normal.  Musculoskeletal: Normal range of motion. He exhibits no edema or tenderness.  Neurological: He is alert and oriented to person, place, and time.  Psychiatric: He has a normal mood and affect. His behavior is normal.          Assessment & Plan:  Diastolic heart failure, compensated LVH.  Blood pressure well controlled History of 6 mm pulmonary nodule.  Follow-up chest CT as scheduled  Low-salt diet, weight loss exercise all encouraged Follow-up 6 months  [id number],PETER Pilar Plate

## 2017-06-01 NOTE — Patient Instructions (Signed)
Limit your sodium (Salt) intake    It is important that you exercise regularly, at least 20 minutes 3 to 4 times per week.  If you develop chest pain or shortness of breath seek  medical attention.  Please check your blood pressure on a regular basis.  If it is consistently greater than 150/90, please make an office appointment.  Return in 6 months for follow-up   

## 2017-06-04 ENCOUNTER — Ambulatory Visit: Payer: BLUE CROSS/BLUE SHIELD | Admitting: Internal Medicine

## 2017-06-22 ENCOUNTER — Other Ambulatory Visit: Payer: Self-pay | Admitting: Internal Medicine

## 2017-07-01 DIAGNOSIS — G4733 Obstructive sleep apnea (adult) (pediatric): Secondary | ICD-10-CM | POA: Diagnosis not present

## 2017-07-08 ENCOUNTER — Other Ambulatory Visit: Payer: Self-pay | Admitting: Internal Medicine

## 2017-09-20 ENCOUNTER — Emergency Department (HOSPITAL_BASED_OUTPATIENT_CLINIC_OR_DEPARTMENT_OTHER): Payer: BLUE CROSS/BLUE SHIELD

## 2017-09-20 ENCOUNTER — Encounter (HOSPITAL_BASED_OUTPATIENT_CLINIC_OR_DEPARTMENT_OTHER): Payer: Self-pay | Admitting: *Deleted

## 2017-09-20 ENCOUNTER — Other Ambulatory Visit: Payer: Self-pay

## 2017-09-20 ENCOUNTER — Emergency Department (HOSPITAL_BASED_OUTPATIENT_CLINIC_OR_DEPARTMENT_OTHER)
Admission: EM | Admit: 2017-09-20 | Discharge: 2017-09-20 | Disposition: A | Discharge status: Home / Self Care | Payer: BLUE CROSS/BLUE SHIELD | Attending: Emergency Medicine | Admitting: Emergency Medicine

## 2017-09-20 DIAGNOSIS — S199XXA Unspecified injury of neck, initial encounter: Secondary | ICD-10-CM | POA: Diagnosis not present

## 2017-09-20 DIAGNOSIS — Z79899 Other long term (current) drug therapy: Secondary | ICD-10-CM | POA: Diagnosis not present

## 2017-09-20 DIAGNOSIS — S0083XA Contusion of other part of head, initial encounter: Secondary | ICD-10-CM | POA: Diagnosis not present

## 2017-09-20 DIAGNOSIS — S0231XA Fracture of orbital floor, right side, initial encounter for closed fracture: Secondary | ICD-10-CM | POA: Insufficient documentation

## 2017-09-20 DIAGNOSIS — W01198A Fall on same level from slipping, tripping and stumbling with subsequent striking against other object, initial encounter: Secondary | ICD-10-CM | POA: Insufficient documentation

## 2017-09-20 DIAGNOSIS — Y998 Other external cause status: Secondary | ICD-10-CM | POA: Insufficient documentation

## 2017-09-20 DIAGNOSIS — R221 Localized swelling, mass and lump, neck: Secondary | ICD-10-CM | POA: Diagnosis not present

## 2017-09-20 DIAGNOSIS — Z9884 Bariatric surgery status: Secondary | ICD-10-CM | POA: Diagnosis not present

## 2017-09-20 DIAGNOSIS — S0993XA Unspecified injury of face, initial encounter: Secondary | ICD-10-CM | POA: Diagnosis not present

## 2017-09-20 DIAGNOSIS — Y9389 Activity, other specified: Secondary | ICD-10-CM | POA: Insufficient documentation

## 2017-09-20 DIAGNOSIS — I5031 Acute diastolic (congestive) heart failure: Secondary | ICD-10-CM | POA: Insufficient documentation

## 2017-09-20 DIAGNOSIS — F17210 Nicotine dependence, cigarettes, uncomplicated: Secondary | ICD-10-CM | POA: Diagnosis not present

## 2017-09-20 DIAGNOSIS — S0011XA Contusion of right eyelid and periocular area, initial encounter: Secondary | ICD-10-CM | POA: Insufficient documentation

## 2017-09-20 DIAGNOSIS — S0181XA Laceration without foreign body of other part of head, initial encounter: Secondary | ICD-10-CM | POA: Diagnosis not present

## 2017-09-20 DIAGNOSIS — Y9289 Other specified places as the place of occurrence of the external cause: Secondary | ICD-10-CM | POA: Diagnosis not present

## 2017-09-20 DIAGNOSIS — I110 Hypertensive heart disease with heart failure: Secondary | ICD-10-CM | POA: Insufficient documentation

## 2017-09-20 DIAGNOSIS — W19XXXA Unspecified fall, initial encounter: Secondary | ICD-10-CM

## 2017-09-20 MED ORDER — HYDROCODONE-ACETAMINOPHEN 5-325 MG PO TABS: 1.0000 | ORAL_TABLET | ORAL | 0 refills | Status: DC | PRN

## 2017-09-20 MED ORDER — ACETAMINOPHEN 325 MG PO TABS
650.0000 mg | ORAL_TABLET | Freq: Once | ORAL | Status: AC
  Administered 2017-09-20: 650 mg via ORAL
  Filled 2017-09-20: qty 2

## 2017-09-20 MED ORDER — IBUPROFEN 800 MG PO TABS
800.0000 mg | ORAL_TABLET | Freq: Once | ORAL | Status: AC
Start: 2017-09-20 — End: 2017-09-20
  Administered 2017-09-20: 800 mg via ORAL
  Filled 2017-09-20: qty 1

## 2017-09-20 MED ORDER — IBUPROFEN 800 MG PO TABS: 800.0000 mg | ORAL_TABLET | Freq: Three times a day (TID) | ORAL | 0 refills | Status: DC

## 2017-09-20 NOTE — ED Triage Notes (Signed)
Pt's wife reports she found pt on floor in garage at 5 pm. Pt states he tripped and fell. Admits to having several mixed drinks. Pt's wife reports he was passed out for approx 20 minutes. Reports it took a while for him to get up and get to recliner where he stayed x 2 hours. Pt has abrasion to right side of his head and has broken vessel right side of right eye. Wife also reports seeing blood from his nose. Pt cao x 4 at this time. Concerned bc he is to fly out of town tomorrow

## 2017-09-20 NOTE — ED Notes (Addendum)
MD at bedside. 

## 2017-09-20 NOTE — ED Notes (Signed)
Patient transported to CT 

## 2017-09-20 NOTE — ED Provider Notes (Signed)
[facility] EMERGENCY DEPARTMENT Provider Note   CSN: [id number] Arrival date & time: 09/20/17  1401     History   Chief Complaint Chief Complaint  Patient presents with  . Fall    HPI Chad Lyons is a 52 y.o. male.  Pt presents to the ED today with facial injury s/p fall.  Pt was drinking with his friends last night and fell and hit the bar in the garage.  He was on the ground for about 20 minutes until his wife found him.  He c/o right sided facial pain.      Past Medical History:  Diagnosis Date  . Diverticulitis    HX OF  . EKG, abnormal    HX OF LEFT ANTERIO FASCICULAR BLOCK ON 02-15-15 EKG  . GERD (gastroesophageal reflux disease)   . Heart murmur    NOW RESOLVED  . Hypertension   . OSA (obstructive sleep apnea) 11/27/2016  . Ventral incisional hernia     Patient Active Problem List   Diagnosis Date Noted  . Hypophosphatemia 02/24/2017  . Pulmonary HTN ([place]) 02/23/2017  . Mediastinal adenopathy 02/23/2017  . Hypokalemia 02/23/2017  . Acute diastolic CHF (congestive heart failure) ([place]) 02/23/2017  . LVH (left ventricular hypertrophy)   . Elevated troponin 02/22/2017  . Abnormal LFTs 02/22/2017  . Acute CHF (congestive heart failure) ([place]) 02/22/2017  . CHF (congestive heart failure) ([place]) 02/21/2017  . OSA (obstructive sleep apnea) 11/27/2016  . Polyp of ascending colon   . Polyp of rectum   . Ventral incisional hernia 02/20/2015  . Fitting and adjustment of gastric lap band 12/11/2011  . ABDOMINAL MASS, RIGHT LOWER QUADRANT 03/17/2007  . OBESITY, MORBID 01/22/2007  . Essential hypertension 01/22/2007  . Hypertrophic obstructive cardiomyopathy ([place]) 01/22/2007  . DIVERTICULITIS, HX OF 01/22/2007    Past Surgical History:  Procedure Laterality Date  . COLON SURGERY  2006   colon resection for polyps  . COLONOSCOPY WITH PROPOFOL N/A 10/31/2016   Procedure: COLONOSCOPY WITH PROPOFOL;  Surgeon: Mauri Pole, MD;  Location: [facility];  Service: Endoscopy;  Laterality: N/A;  . HIATAL HERNIA REPAIR  1991  . INSERTION OF MESH N/A 02/20/2015   Procedure: INSERTION OF MESH;  Surgeon: Excell Seltzer, MD;  Location: [facility];  Service: General;  Laterality: N/A;  . LAPAROSCOPIC GASTRIC BANDING  2011  . VENTRAL HERNIA REPAIR N/A 02/20/2015   Procedure: OPEN REPAIR VENTRAL INCISIONAL HERNIA;  Surgeon: Excell Seltzer, MD;  Location: [facility];  Service: General;  Laterality: N/A;  . WISDOM TOOTH EXTRACTION         Home Medications    Prior to Admission medications   Medication Sig Start Date End Date Taking? Authorizing Provider  furosemide (LASIX) 40 MG tablet Take 1 tablet (40 mg total) by mouth 2 (two) times daily. 03/30/17  Yes Marletta Lor, MD  losartan (COZAAR) 100 MG tablet Take 1 tablet (100 mg total) by mouth daily. 03/30/17  Yes Marletta Lor, MD  phosphorus (K PHOS NEUTRAL) [phone] MG tablet Take 2 tablets (500 mg total) by mouth 3 (three) times daily. 02/24/17  Yes Sheikh, Omair Latif, DO  potassium chloride SA (K-DUR,KLOR-CON) 20 MEQ tablet Take 1 tablet (20 mEq total) by mouth daily. 03/30/17  Yes Marletta Lor, MD  sertraline (ZOLOFT) 50 MG tablet TAKE 1 TABLET DAILY (PHYSICAL SET UP FOR 06/30/16) 06/22/17  Yes Marletta Lor, MD  verapamil (CALAN-SR) 240 MG CR tablet TAKE 1 TABLET AT BEDTIME (PHYSICAL  SET UP FOR 06/30/16) 06/22/17  Yes Marletta Lor, MD  ALPRAZolam Duanne Moron) 0.5 MG tablet Take 1 tablet (0.5 mg total) by mouth daily as needed. 03/30/17   Marletta Lor, MD  HYDROcodone-acetaminophen (NORCO/VICODIN) 5-325 MG tablet Take 1 tablet by mouth every 4 (four) hours as needed. 09/20/17   Isla Pence, MD  ibuprofen (ADVIL,MOTRIN) 800 MG tablet Take 1 tablet (800 mg total) by mouth 3 (three) times daily. 09/20/17   Isla Pence, MD  triamcinolone cream (KENALOG) 0.1 % APPLY TOPICALLY TWICE A DAY AS DIRECTED. NEED OFFICE VISIT FOR FURTHER REFILLS. 07/08/17    Marletta Lor, MD    Family History Family History  Problem Relation Age of Onset  . Heart disease Mother   . Heart disease Maternal Aunt   . Cancer Maternal Uncle        pancreatic  . Heart disease Maternal Uncle   . Rheum arthritis Maternal Grandmother   . Colon cancer Neg Hx     Social History Social History   Tobacco Use  . Smoking status: Current Every Day Smoker    Types: Cigars, Cigarettes    Last attempt to quit: 10/11/2004    Years since quitting: 12.9  . Smokeless tobacco: Never Used  . Tobacco comment: Current Cigars 1 every other day -----QUIT cigarettes in 2006>> 1/2PPD x 15 years  Substance Use Topics  . Alcohol use: Yes    Alcohol/week: 1.8 - 2.4 oz    Types: 3 - 4 Shots of liquor per week    Comment: socially;  . Drug use: No     Allergies   Hydromet [hydrocodone-homatropine]; Iohexol; and Penicillins   Review of Systems Review of Systems  HENT: Positive for facial swelling.   All other systems reviewed and are negative.    Physical Exam Updated Vital Signs BP (!) 158/89   Pulse 64   Temp 99.1 F (37.3 C) (Oral)   Resp 14   Ht 6\' 2"  (1.88 m)   Wt (!) 138.3 kg (305 lb)   SpO2 98%   BMI 39.16 kg/m   Physical Exam  Constitutional: He is oriented to person, place, and time. He appears well-developed and well-nourished.  HENT:  Head:    Right Ear: External ear normal.  Left Ear: External ear normal.  Nose: Nose normal.  Mouth/Throat: Oropharynx is clear and moist.  Eyes: Conjunctivae and EOM are normal. Pupils are equal, round, and reactive to light.  Neck: Normal range of motion. Neck supple.  Cardiovascular: Normal rate, regular rhythm, normal heart sounds and intact distal pulses.  Pulmonary/Chest: Effort normal and breath sounds normal.  Abdominal: Soft. Bowel sounds are normal.  Musculoskeletal: Normal range of motion.  Neurological: He is alert and oriented to person, place, and time.  Skin: Skin is warm. Capillary  refill takes less than 2 seconds.  Psychiatric: He has a normal mood and affect. His behavior is normal. Judgment and thought content normal.  Nursing note and vitals reviewed.    ED Treatments / Results  Labs (all labs ordered are listed, but only abnormal results are displayed) Labs Reviewed - No data to display  EKG  EKG Interpretation None       Radiology Ct Head Wo Contrast  Result Date: 09/20/2017 CLINICAL DATA:  Pt's wife reports she found pt on floor in garage at 5 pm. Pt states he tripped and fell. Admits to having several mixed drinks. Pt's wife reports he was passed out for approx 20 minutes. Abrasion  to the right side of the head. Blood from the nose. EXAM: CT HEAD WITHOUT CONTRAST TECHNIQUE: Contiguous axial images were obtained from the base of the skull through the vertex without intravenous contrast. COMPARISON:  CT 03/23/2011 FINDINGS: Brain: No evidence of acute infarction, hemorrhage, hydrocephalus, extra-axial collection or mass lesion/mass effect. Vascular: No hyperdense vessel or unexpected calcification. Skull: No calvarial fracture. Sinuses/Orbits: There are comminuted fractures involving the medial wall and floor of the right orbit. There is entrapment of intraorbital fat secondary to a trap door type fracture of the floor of the orbit. The inferior rectus is adjacent to the entrapped intra-articular fat but does not appear to be entrapped. Air-fluid level is identified within the right maxillary sinus. There preorbital soft tissue swelling on the right. The zygomatic arches and nasal bones are intact. Right ethmoid fractures. Other: None IMPRESSION: 1.  No evidence for acute intracranial abnormality. 2. Fractures of the floor and medial wall of the right orbit, associated with entrapment of intraorbital fat into the upper portion of the right maxillary sinus. Adjacent inferior rectus muscle approximate the trap door fragment but is not entrapped in the maxillary sinus.  3. Fractures of the right ethmoid sinuses. 4. Nasal bones are intact. Electronically Signed   By: Nolon Nations M.D.   On: 09/20/2017 15:06   Ct Cervical Spine Wo Contrast  Result Date: 09/20/2017 CLINICAL DATA:  Facial trauma. Recent fall. Right-sided facial swelling and laceration. Initial encounter. EXAM: CT MAXILLOFACIAL WITHOUT CONTRAST CT CERVICAL SPINE WITHOUT CONTRAST TECHNIQUE: Multidetector CT imaging of the maxillofacial structures was performed. Multiplanar CT image reconstructions were also generated. A small metallic BB was placed on the right temple in order to reliably differentiate right from left. Multidetector CT imaging of the cervical spine was performed without intravenous contrast. Multiplanar CT image reconstructions were also generated. COMPARISON:  Head CT today FINDINGS: CT MAXILLOFACIAL FINDINGS Osseous: As seen on today's earlier head CT, there is a right orbital floor fracture. Bony defect in the orbital floor extends over an anteroposterior length of 2.3 cm and is approximately 10 mm in oblique left to right width. The orbital floor fracture fragment is displaced inferiorly and medially into the maxillary sinus, and there is herniation of orbital fat through the defect. The inferior rectus muscle extends to the level of the orbital floor defect but is not frankly herniated through the defect. No additional acute maxillofacial fracture is identified. Mild focal depression of the left lamina papyracea is chronic and could reflect an old medial orbital fracture. The temporomandibular joints are located. Orbits: Right orbital floor fracture as above. Mild right orbital emphysema. No retrobulbar hematoma or proptosis. Globes appear symmetric and intact. Sinuses: Moderate volume blood in the right maxillary sinus. Clear mastoid air cells. Soft tissues: Mild right periorbital soft tissue swelling. Limited intracranial: Unremarkable. CT CERVICAL FINDINGS Alignment: Cervical spine  straightening.  No listhesis. Skull base and vertebrae: No acute fracture or suspicious osseous lesion. Soft tissues and spinal canal: No prevertebral fluid or swelling. No visible canal hematoma. Disc levels:  Mild cervical spondylosis. Upper chest: Clear lung apices. Partially visualized thyroid goiter with calcified left-sided nodules extending into the superior mediastinum, similar to 05/22/2017 chest CT. Other: None. IMPRESSION: 1. Depressed right orbital floor fracture as above. 2. No acute fracture or subluxation in the cervical spine. Electronically Signed   By: Logan Bores M.D.   On: 09/20/2017 15:46   Ct Maxillofacial Wo Contrast  Result Date: 09/20/2017 CLINICAL DATA:  Facial trauma. Recent fall.  Right-sided facial swelling and laceration. Initial encounter. EXAM: CT MAXILLOFACIAL WITHOUT CONTRAST CT CERVICAL SPINE WITHOUT CONTRAST TECHNIQUE: Multidetector CT imaging of the maxillofacial structures was performed. Multiplanar CT image reconstructions were also generated. A small metallic BB was placed on the right temple in order to reliably differentiate right from left. Multidetector CT imaging of the cervical spine was performed without intravenous contrast. Multiplanar CT image reconstructions were also generated. COMPARISON:  Head CT today FINDINGS: CT MAXILLOFACIAL FINDINGS Osseous: As seen on today's earlier head CT, there is a right orbital floor fracture. Bony defect in the orbital floor extends over an anteroposterior length of 2.3 cm and is approximately 10 mm in oblique left to right width. The orbital floor fracture fragment is displaced inferiorly and medially into the maxillary sinus, and there is herniation of orbital fat through the defect. The inferior rectus muscle extends to the level of the orbital floor defect but is not frankly herniated through the defect. No additional acute maxillofacial fracture is identified. Mild focal depression of the left lamina papyracea is chronic and  could reflect an old medial orbital fracture. The temporomandibular joints are located. Orbits: Right orbital floor fracture as above. Mild right orbital emphysema. No retrobulbar hematoma or proptosis. Globes appear symmetric and intact. Sinuses: Moderate volume blood in the right maxillary sinus. Clear mastoid air cells. Soft tissues: Mild right periorbital soft tissue swelling. Limited intracranial: Unremarkable. CT CERVICAL FINDINGS Alignment: Cervical spine straightening.  No listhesis. Skull base and vertebrae: No acute fracture or suspicious osseous lesion. Soft tissues and spinal canal: No prevertebral fluid or swelling. No visible canal hematoma. Disc levels:  Mild cervical spondylosis. Upper chest: Clear lung apices. Partially visualized thyroid goiter with calcified left-sided nodules extending into the superior mediastinum, similar to 05/22/2017 chest CT. Other: None. IMPRESSION: 1. Depressed right orbital floor fracture as above. 2. No acute fracture or subluxation in the cervical spine. Electronically Signed   By: Logan Bores M.D.   On: 09/20/2017 15:46    Procedures Procedures (including critical care time)  Medications Ordered in ED Medications  ibuprofen (ADVIL,MOTRIN) tablet 800 mg (800 mg Oral Given 09/20/17 1536)  acetaminophen (TYLENOL) tablet 650 mg (650 mg Oral Given 09/20/17 1536)     Initial Impression / Assessment and Plan / ED Course  I have reviewed the triage vital signs and the nursing notes.  Pertinent labs & imaging results that were available during my care of the patient were reviewed by me and considered in my medical decision making (see chart for details).    Pt is supposed to fly tomorrow.  Due to the small amt of air behind the right eye, I told him not to fly until cleared by ENT.  Pt knows to return if worse.  Final Clinical Impressions(s) / ED Diagnoses   Final diagnoses:  Fall, initial encounter  Contusion of face, initial encounter  Closed fracture  of right orbital floor, initial encounter [facility])    ED Discharge Orders        Ordered    HYDROcodone-acetaminophen (NORCO/VICODIN) 5-325 MG tablet  Every 4 hours PRN     09/20/17 1610    ibuprofen (ADVIL,MOTRIN) 800 MG tablet  3 times daily     09/20/17 1610       Isla Pence, MD 09/20/17 1611

## 2017-09-28 DIAGNOSIS — S0280XA Fracture of other specified skull and facial bones, unspecified side, initial encounter for closed fracture: Secondary | ICD-10-CM | POA: Diagnosis not present

## 2017-10-02 ENCOUNTER — Other Ambulatory Visit: Payer: Self-pay | Admitting: Internal Medicine

## 2017-10-02 DIAGNOSIS — S0231XA Fracture of orbital floor, right side, initial encounter for closed fracture: Secondary | ICD-10-CM | POA: Diagnosis not present

## 2017-10-02 DIAGNOSIS — H1131 Conjunctival hemorrhage, right eye: Secondary | ICD-10-CM | POA: Diagnosis not present

## 2017-10-06 DIAGNOSIS — G4733 Obstructive sleep apnea (adult) (pediatric): Secondary | ICD-10-CM | POA: Diagnosis not present

## 2017-10-06 NOTE — Telephone Encounter (Signed)
Okay for refill?  

## 2017-10-06 NOTE — Telephone Encounter (Signed)
Please advise. I do not see indication for this med on problem list or recent OV notes.

## 2017-10-06 NOTE — Telephone Encounter (Signed)
Medication filled to pharmacy as requested.   

## 2017-10-12 DIAGNOSIS — Z9884 Bariatric surgery status: Secondary | ICD-10-CM | POA: Diagnosis not present

## 2017-11-30 ENCOUNTER — Ambulatory Visit: Payer: BLUE CROSS/BLUE SHIELD | Admitting: Internal Medicine

## 2017-12-02 ENCOUNTER — Inpatient Hospital Stay: Admission: RE | Admit: 2017-12-02 | Payer: BLUE CROSS/BLUE SHIELD | Source: Ambulatory Visit

## 2017-12-07 ENCOUNTER — Encounter: Payer: Self-pay | Admitting: Internal Medicine

## 2017-12-07 ENCOUNTER — Ambulatory Visit (INDEPENDENT_AMBULATORY_CARE_PROVIDER_SITE_OTHER): Payer: BLUE CROSS/BLUE SHIELD | Admitting: Internal Medicine

## 2017-12-07 VITALS — BP 120/76 | HR 59 | Temp 98.2°F | Wt 320.0 lb

## 2017-12-07 DIAGNOSIS — I5031 Acute diastolic (congestive) heart failure: Secondary | ICD-10-CM | POA: Diagnosis not present

## 2017-12-07 DIAGNOSIS — I10 Essential (primary) hypertension: Secondary | ICD-10-CM

## 2017-12-07 DIAGNOSIS — R590 Localized enlarged lymph nodes: Secondary | ICD-10-CM

## 2017-12-07 LAB — BASIC METABOLIC PANEL
BUN: 19 mg/dL (ref 6–23)
CO2: 27 mEq/L (ref 19–32)
Calcium: 9.4 mg/dL (ref 8.4–10.5)
Chloride: 103 mEq/L (ref 96–112)
Creatinine, Ser: 0.91 mg/dL (ref 0.40–1.50)
GFR: 112.22 mL/min (ref >60.00)
Glucose, Bld: 92 mg/dL (ref 70–99)
Potassium: 4.1 mEq/L (ref 3.5–5.1)
Sodium: 141 mEq/L (ref 135–145)

## 2017-12-07 MED ORDER — VALSARTAN 80 MG PO TABS: 80.0000 mg | ORAL_TABLET | Freq: Every day | ORAL | 4 refills | Status: DC

## 2017-12-07 NOTE — Progress Notes (Signed)
Subjective:    Patient ID: Chad Lyons, male    DOB: Jun 24, 1966, 52 y.o.   MRN: [id number]  HPI 52 year old patient who is seen today in follow-up. He has history of essential hypertension and LVH.  There has been some concern for hypertrophic cardiomyopathy.  The patient was admitted to the hospital in July of last year with acute diastolic heart failure.  He was discharged on losartan but apparently this product is not available at this time.  He is doing quite well without symptoms of heart failure.  He is quite active with walking and denies any DOE or any history of peripheral edema. He is followed by pulmonary medicine with OSA which is well controlled with CPAP.  He is scheduled for follow-up chest CT due to a history of hilar lymphadenopathy noted this past summer He has a history of morbid obesity status post gastric banding.  His weight is down 15 pounds since his last examination here  Past Medical History:  Diagnosis Date  . Diverticulitis    HX OF  . EKG, abnormal    HX OF LEFT ANTERIO FASCICULAR BLOCK ON 02-15-15 EKG  . GERD (gastroesophageal reflux disease)   . Heart murmur    NOW RESOLVED  . Hypertension   . OSA (obstructive sleep apnea) 11/27/2016  . Ventral incisional hernia      Social History   Socioeconomic History  . Marital status: Married    Spouse name: Not on file  . Number of children: Not on file  . Years of education: Not on file  . Highest education level: Not on file  Occupational History  . Occupation: Press photographer  Social Needs  . Financial resource strain: Not on file  . Food insecurity:    Worry: Not on file    Inability: Not on file  . Transportation needs:    Medical: Not on file    Non-medical: Not on file  Tobacco Use  . Smoking status: Current Every Day Smoker    Types: Cigars, Cigarettes    Last attempt to quit: 10/11/2004    Years since quitting: 13.1  . Smokeless tobacco: Never Used  . Tobacco comment: Current Cigars 1 every  other day -----QUIT cigarettes in 2006>> 1/2PPD x 15 years  Substance and Sexual Activity  . Alcohol use: Yes    Alcohol/week: 1.8 - 2.4 oz    Types: 3 - 4 Shots of liquor per week    Comment: socially;  . Drug use: No  . Sexual activity: Not on file    Comment: still smokes cigars  Lifestyle  . Physical activity:    Days per week: Not on file    Minutes per session: Not on file  . Stress: Not on file  Relationships  . Social connections:    Talks on phone: Not on file    Gets together: Not on file    Attends religious service: Not on file    Active member of club or organization: Not on file    Attends meetings of clubs or organizations: Not on file    Relationship status: Not on file  . Intimate partner violence:    Fear of current or ex partner: Not on file    Emotionally abused: Not on file    Physically abused: Not on file    Forced sexual activity: Not on file  Other Topics Concern  . Not on file  Social History Narrative  . Not on file  Past Surgical History:  Procedure Laterality Date  . COLON SURGERY  2006   colon resection for polyps  . COLONOSCOPY WITH PROPOFOL N/A 10/31/2016   Procedure: COLONOSCOPY WITH PROPOFOL;  Surgeon: Mauri Pole, MD;  Location: [facility];  Service: Endoscopy;  Laterality: N/A;  . HIATAL HERNIA REPAIR  1991  . INSERTION OF MESH N/A 02/20/2015   Procedure: INSERTION OF MESH;  Surgeon: Excell Seltzer, MD;  Location: [facility];  Service: General;  Laterality: N/A;  . LAPAROSCOPIC GASTRIC BANDING  2011  . VENTRAL HERNIA REPAIR N/A 02/20/2015   Procedure: OPEN REPAIR VENTRAL INCISIONAL HERNIA;  Surgeon: Excell Seltzer, MD;  Location: [facility];  Service: General;  Laterality: N/A;  . WISDOM TOOTH EXTRACTION      Family History  Problem Relation Age of Onset  . Heart disease Mother   . Heart disease Maternal Aunt   . Cancer Maternal Uncle        pancreatic  . Heart disease Maternal Uncle   . Rheum arthritis Maternal  Grandmother   . Colon cancer Neg Hx     Allergies  Allergen Reactions  . Hydromet [Hydrocodone-Homatropine] Itching  . Iohexol Hives and Itching    PT DEVELOPS ITCHING AND HIVES SEVERAL HOURS AFTER INJECTION OF OMNIPAQUE   . Penicillins Itching    Has patient had a PCN reaction causing immediate rash, facial/tongue/throat swelling, SOB or lightheadedness with hypotension: Yes Has patient had a PCN reaction causing severe rash involving mucus membranes or skin necrosis: No Has patient had a PCN reaction that required hospitalization: No Has patient had a PCN reaction occurring within the last 10 years: No If all of the above answers are "NO", then may proceed with Cephalosporin use.     Current Outpatient Medications on File Prior to Visit  Medication Sig Dispense Refill  . ALPRAZolam (XANAX) 0.5 MG tablet Take 1 tablet (0.5 mg total) by mouth daily as needed. 90 tablet 2  . furosemide (LASIX) 40 MG tablet Take 1 tablet (40 mg total) by mouth 2 (two) times daily. 180 tablet 3  . HYDROcodone-acetaminophen (NORCO/VICODIN) 5-325 MG tablet Take 1 tablet by mouth every 4 (four) hours as needed. 15 tablet 0  . ibuprofen (ADVIL,MOTRIN) 800 MG tablet Take 1 tablet (800 mg total) by mouth 3 (three) times daily. 21 tablet 0  . losartan (COZAAR) 100 MG tablet Take 1 tablet (100 mg total) by mouth daily. 90 tablet 3  . phosphorus (K PHOS NEUTRAL) 155-852-130 MG tablet Take 2 tablets (500 mg total) by mouth 3 (three) times daily. 3 tablet 0  . potassium chloride SA (K-DUR,KLOR-CON) 20 MEQ tablet Take 1 tablet (20 mEq total) by mouth daily. 90 tablet 3  . sertraline (ZOLOFT) 50 MG tablet TAKE 1 TABLET DAILY (PHYSICAL SET UP FOR 06/30/16) 90 tablet 1  . triamcinolone cream (KENALOG) 0.1 % APPLY TOPICALLY TWICE A DAY AS DIRECTED. NEED OFFICE VISIT FOR FURTHER REFILLS. 30 g 0  . verapamil (CALAN-SR) 240 MG CR tablet TAKE 1 TABLET AT BEDTIME (PHYSICAL SET UP FOR 06/30/16) 90 tablet 1   No current  facility-administered medications on file prior to visit.     BP 120/76 (BP Location: Right Arm, Patient Position: Sitting, Cuff Size: Large)   Pulse (!) 59   Temp 98.2 F (36.8 C) (Oral)   Wt (!) 320 lb (145.2 kg)   SpO2 99%   BMI 41.09 kg/m      Review of Systems  Constitutional: Negative for appetite change, chills, fatigue  and fever.  HENT: Negative for congestion, dental problem, ear pain, hearing loss, sore throat, tinnitus, trouble swallowing and voice change.   Eyes: Negative for pain, discharge and visual disturbance.  Respiratory: Negative for cough, chest tightness, wheezing and stridor.   Cardiovascular: Negative for chest pain, palpitations and leg swelling.  Gastrointestinal: Negative for abdominal distention, abdominal pain, blood in stool, constipation, diarrhea, nausea and vomiting.  Genitourinary: Negative for difficulty urinating, discharge, flank pain, genital sores, hematuria and urgency.  Musculoskeletal: Negative for arthralgias, back pain, gait problem, joint swelling, myalgias and neck stiffness.  Skin: Negative for rash.  Neurological: Negative for dizziness, syncope, speech difficulty, weakness, numbness and headaches.  Hematological: Negative for adenopathy. Does not bruise/bleed easily.  Psychiatric/Behavioral: Negative for behavioral problems and dysphoric mood. The patient is not nervous/anxious.        Objective:   Physical Exam  Constitutional: He is oriented to person, place, and time. He appears well-developed. No distress.  Weight 320 Blood pressure 110/74  HENT:  Head: Normocephalic.  Right Ear: External ear normal.  Left Ear: External ear normal.  Eyes: Conjunctivae and EOM are normal.  Neck: Normal range of motion.  Cardiovascular: Normal rate, normal heart sounds and intact distal pulses.  Pulmonary/Chest: Breath sounds normal. No respiratory distress. He has no wheezes. He has no rales.  Abdominal: Bowel sounds are normal.    Musculoskeletal: Normal range of motion. He exhibits no edema or tenderness.  Neurological: He is alert and oriented to person, place, and time.  Psychiatric: He has a normal mood and affect. His behavior is normal.          Assessment & Plan:  Essential hypertension well-controlled.  Losartan is apparently unavailable.  Will switch to an alternate ARB History of diastolic heart failure.   Question of hypertrophic cardiomyopathy.  We will set up follow-up with cardiology OSA follow-up pulmonary medicine  Return here 6 months [place]

## 2017-12-07 NOTE — Patient Instructions (Addendum)
Limit your sodium (Salt) intake  Please check your blood pressure on a regular basis.  If it is consistently greater than 150/90, please make an office appointment.    It is important that you exercise regularly, at least 20 minutes 3 to 4 times per week.  If you develop chest pain or shortness of breath seek  medical attention.  You need to lose weight.  Consider a lower calorie diet and regular exercise.   Cardiology and pulmonary follow-up as scheduled

## 2017-12-14 ENCOUNTER — Ambulatory Visit (INDEPENDENT_AMBULATORY_CARE_PROVIDER_SITE_OTHER)
Admission: RE | Admit: 2017-12-14 | Discharge: 2017-12-14 | Disposition: A | Discharge status: Home / Self Care | Payer: BLUE CROSS/BLUE SHIELD | Source: Ambulatory Visit | Attending: Pulmonary Disease | Admitting: Pulmonary Disease

## 2017-12-14 DIAGNOSIS — R918 Other nonspecific abnormal finding of lung field: Secondary | ICD-10-CM

## 2017-12-14 DIAGNOSIS — R911 Solitary pulmonary nodule: Secondary | ICD-10-CM | POA: Diagnosis not present

## 2017-12-15 ENCOUNTER — Other Ambulatory Visit: Payer: Self-pay | Admitting: Internal Medicine

## 2017-12-15 ENCOUNTER — Telehealth: Payer: Self-pay | Admitting: Pulmonary Disease

## 2017-12-15 NOTE — Telephone Encounter (Signed)
Spoke with patient. He is aware of results. Verbalized understanding. Nothing else needed at time of call.

## 2017-12-15 NOTE — Telephone Encounter (Signed)
Copied from [place] [phone]. Topic: Quick Communication - See Telephone Encounter >> Dec 15, 2017 11:04 AM Marja Kays F wrote: Pt is needing a new rx for Alprazolam [facility]  [facility]   [phone]  Best number [phone]

## 2017-12-15 NOTE — Telephone Encounter (Signed)
Request for refill of Xanax, last filled on 03/30/17 #90.  LOV:12/07/17 Dr. Burnice Logan  [facility]

## 2017-12-15 NOTE — Telephone Encounter (Signed)
CT chest 12/14/17 >> no enlarged LN, previous nodule in RML no longer seen, stable lingular scarring   Please let him know that his CT chest looks better.  Lymph node swelling and right middle lung nodule both resolved.  No additional CT scanning needed at this time.

## 2017-12-16 ENCOUNTER — Other Ambulatory Visit: Payer: Self-pay

## 2017-12-16 MED ORDER — ALPRAZOLAM 0.5 MG PO TABS: 0.5000 mg | ORAL_TABLET | Freq: Every day | ORAL | 2 refills | Status: DC | PRN

## 2017-12-19 ENCOUNTER — Other Ambulatory Visit: Payer: Self-pay | Admitting: Internal Medicine

## 2018-01-07 DIAGNOSIS — G4733 Obstructive sleep apnea (adult) (pediatric): Secondary | ICD-10-CM | POA: Diagnosis not present

## 2018-02-22 ENCOUNTER — Telehealth: Payer: Self-pay | Admitting: Internal Medicine

## 2018-02-22 NOTE — Telephone Encounter (Signed)
Copied from [place] [id number]. Topic: Quick Communication - Rx Refill/Question >> Feb 22, 2018  1:52 PM Oliver Pila B wrote: Medication: ALPRAZolam Duanne Moron) 0.5 MG tablet [[id number]]   Has the patient contacted their pharmacy? Yes.   (Agent: If no, request that the patient contact the pharmacy for the refill.) (Agent: If yes, when and what did the pharmacy advise?)  Preferred Pharm[facility] (with phone number [facility]  Agent: Please be advised that RX refills may take up to 3 business days. We ask that you follow-up with your pharmacy.

## 2018-02-23 ENCOUNTER — Other Ambulatory Visit: Payer: Self-pay

## 2018-02-23 MED ORDER — ALPRAZOLAM 0.5 MG PO TABS: 0.5000 mg | ORAL_TABLET | Freq: Every day | ORAL | 2 refills | Status: DC | PRN

## 2018-02-23 NOTE — Telephone Encounter (Signed)
LOV  12/07/17 Dr. Inda Merlin Last refills  12/16/17  # 31 with 2 refills

## 2018-02-23 NOTE — Telephone Encounter (Signed)
Rx printed to be faxed or called in.

## 2018-02-24 NOTE — Telephone Encounter (Signed)
Patient is calling and the xanax was sent to the local pharmacy and was supposed to go to [facility]. Please resend this.

## 2018-02-25 NOTE — Telephone Encounter (Signed)
[facility]

## 2018-03-01 NOTE — Telephone Encounter (Signed)
Rx resent to corrected pharmacy

## 2018-03-08 ENCOUNTER — Other Ambulatory Visit: Payer: Self-pay | Admitting: Internal Medicine

## 2018-04-02 IMAGING — CT CT ANGIO CHEST
2 of 8 series · 18 of 36 positions shown · IV contrast (isovue)
Comparison: Chest x-ray from earlier today

CLINICAL DATA: Shortness of breath and fever for 2 days.

EXAM:
CT ANGIOGRAPHY CHEST WITH CONTRAST
TECHNIQUE: Multidetector CT imaging of the chest was performed using the
standard protocol during bolus administration of intravenous
contrast. Multiplanar CT image reconstructions and MIPs were
obtained to evaluate the vascular anatomy.
CONTRAST:  100 mL of Isovue 370

[Series 6: pe coronal mpr · coronal · 0.54mm/px · 1 of 101 slices shown]
[im 51/101  mediastinal]
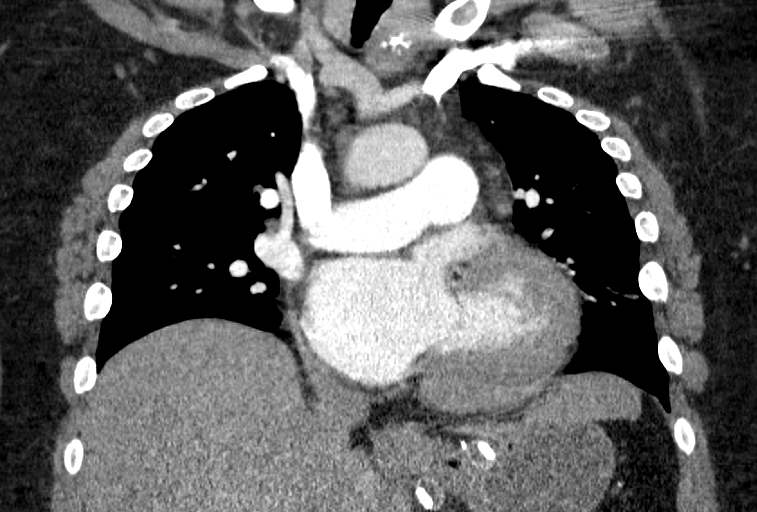

[Series 10: pe thins · axial · 0.89mm/px · z∈[-155,+114]mm · 17 of 399 slices shown]
[im 20/399  lung]
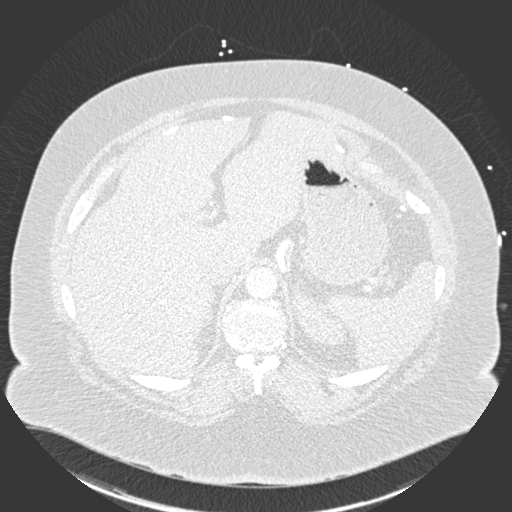
[im 40/399  mediastinal]
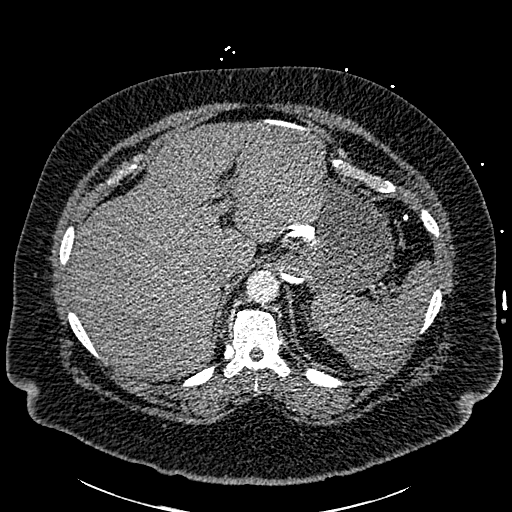
[im 60/399  lung]
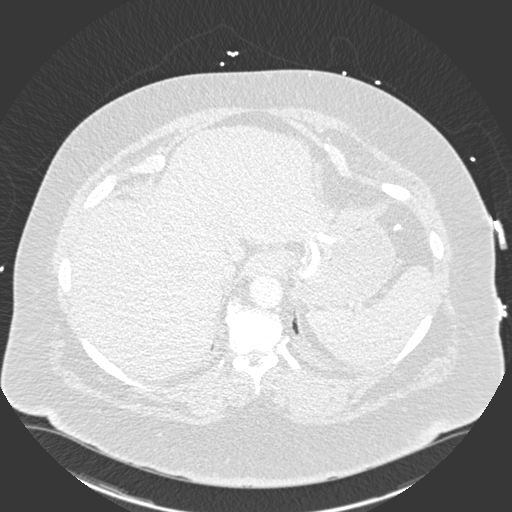
[im 80/399  mediastinal]
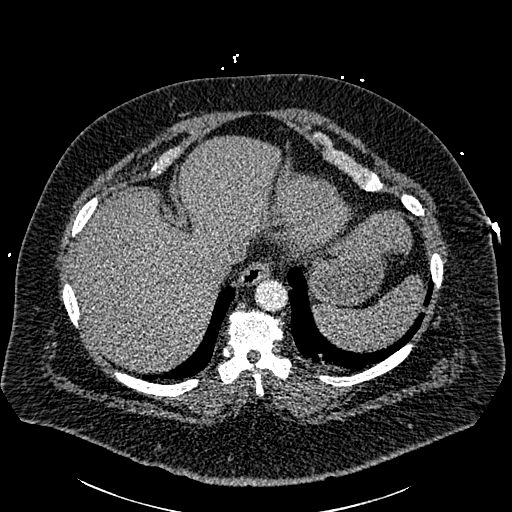
[im 120/399  lung]
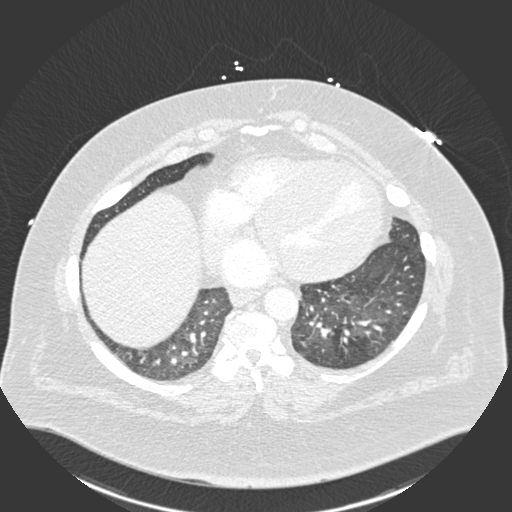
[im 140/399  mediastinal]
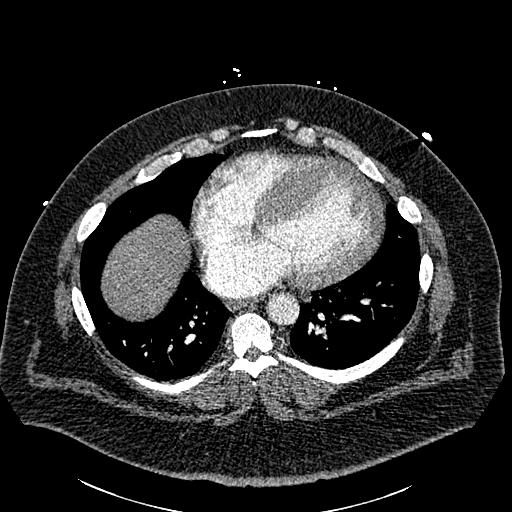
[im 160/399  lung]
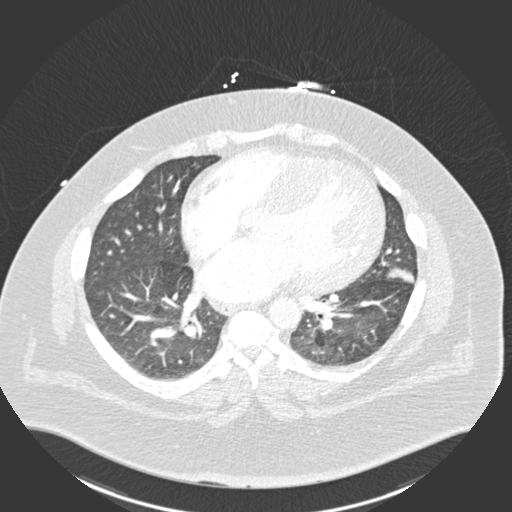
[im 180/399  mediastinal]
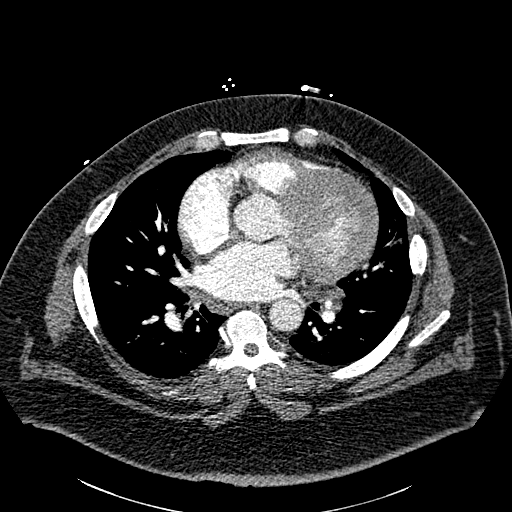
[im 200/399  lung]
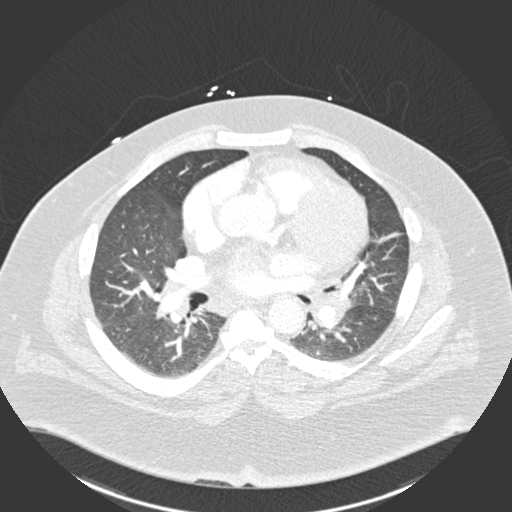
[im 219/399  mediastinal]
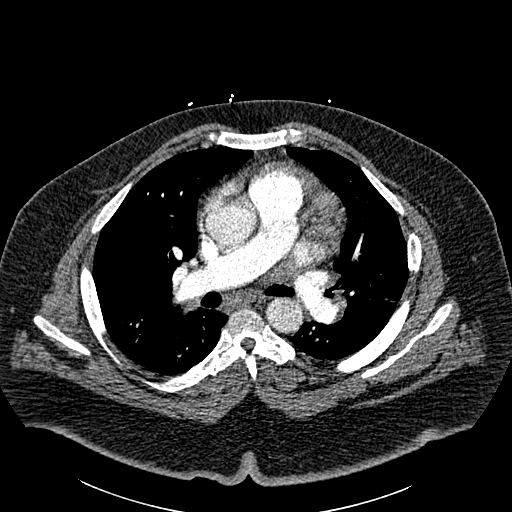
[im 239/399  lung]
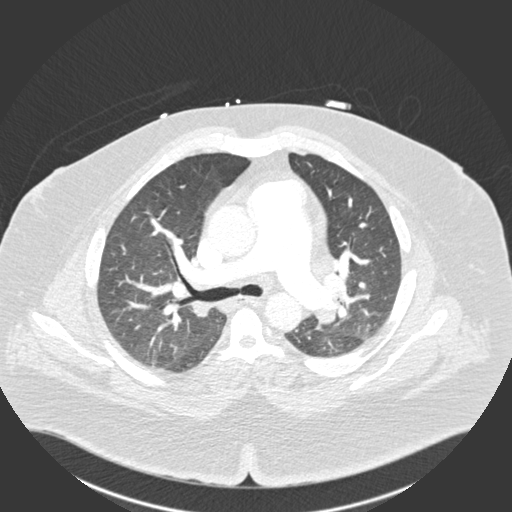
[im 259/399  mediastinal]
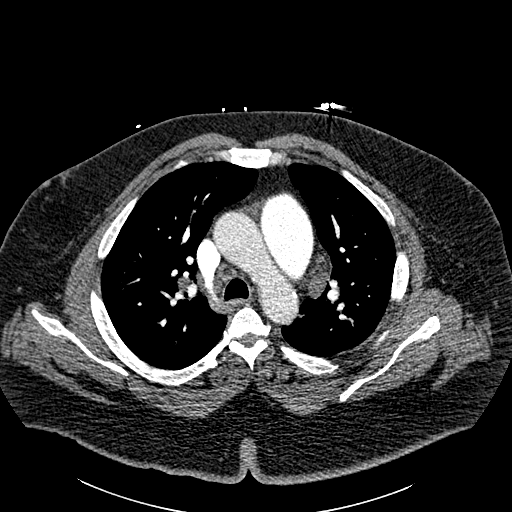
[im 279/399  lung]
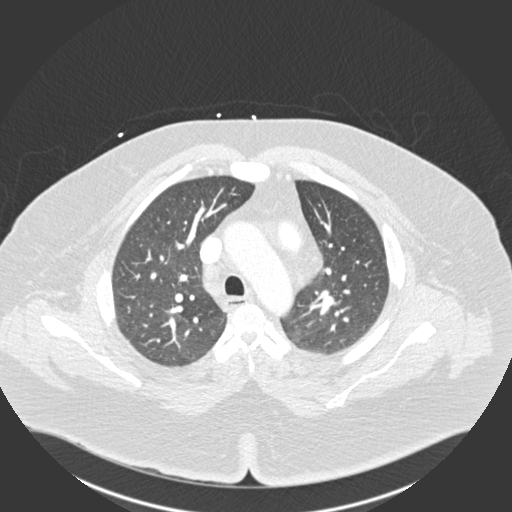
[im 319/399  mediastinal]
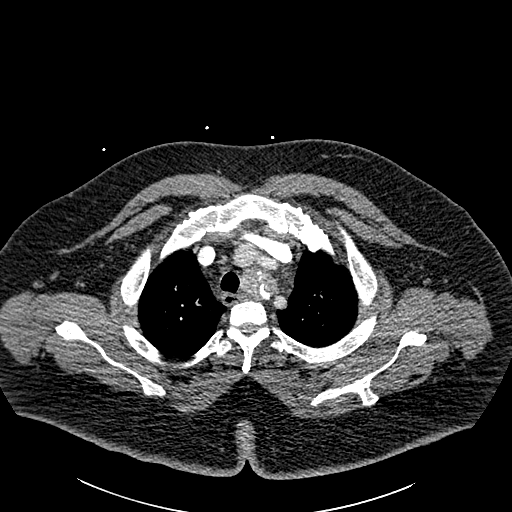
[im 339/399  lung]
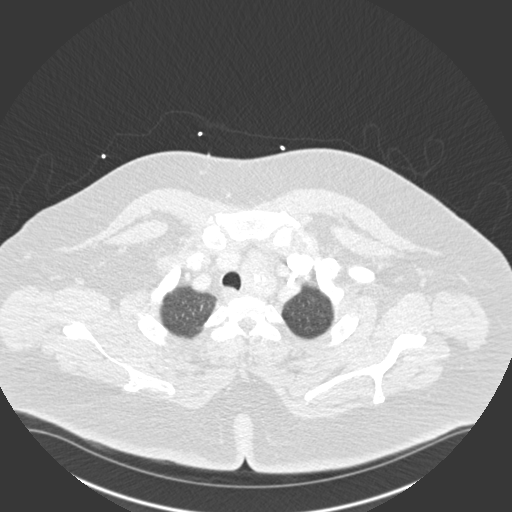
[im 359/399  mediastinal]
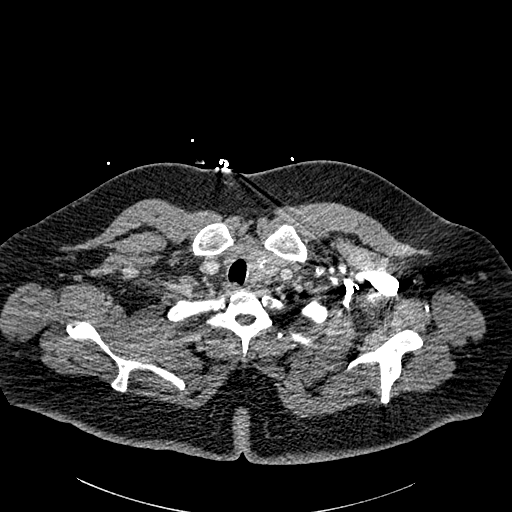
[im 379/399  lung]
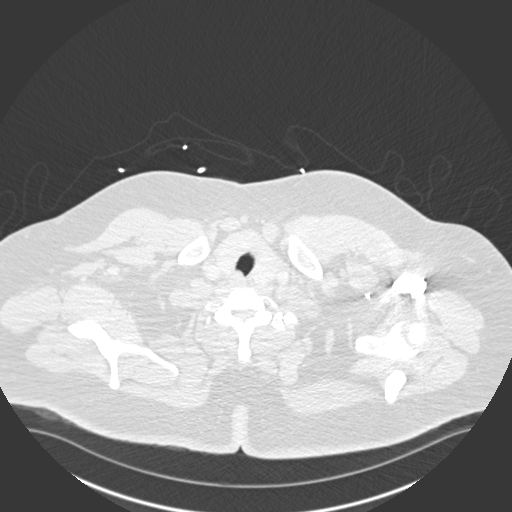

[18 of 36 positions shown; findings below may reference images not displayed]

FINDINGS: Cardiovascular: Cardiomegaly is noted. The thoracic aorta
demonstrates no aneurysm or dissection. Minimal atherosclerotic
change. The main pulmonary artery measures 4.8 cm consistent with
pulmonary hypertension. There is mild stairstep artifact in the left
base. Taking this into account, no pulmonary emboli identified.

Mediastinum/Nodes: A tiny left effusion is seen. No right-sided
pleural effusion or pericardial effusion. Adenopathy is seen in the
mediastinum and hila. A representative right paratracheal node on
series 4, image 30 measures 15 mm and a representative left hilar
node measures 17 mm on image 53.

Lungs/Pleura: Central airways are normal. No pneumothorax. Scattered
atelectasis in the lungs. Scattered regions of mild air trapping. No
suspicious nodules or masses. No focal infiltrates.

Upper Abdomen: A gastric band is in place. No other abnormalities
seen in the upper abdomen.

Musculoskeletal: No chest wall abnormality. No acute or significant
osseous findings.

Review of the MIP images confirms the above findings.
IMPRESSION: 1. No pulmonary emboli.
2. Adenopathy is seen in the mediastinum and hila. This is
nonspecific and could be reactive, inflammatory, or neoplastic.
Recommend clinical correlation. Recommend short-term follow-up to
ensure resolution.
3. Pulmonary arterial hypertension.
4. Cardiomegaly.
5. Mild atherosclerotic change in the thoracic aorta.
6. No other abnormalities.

Aortic Atherosclerosis ([id number].W).

## 2018-04-14 DIAGNOSIS — G4733 Obstructive sleep apnea (adult) (pediatric): Secondary | ICD-10-CM | POA: Diagnosis not present

## 2018-04-20 ENCOUNTER — Other Ambulatory Visit: Payer: Self-pay | Admitting: Internal Medicine

## 2018-04-20 ENCOUNTER — Ambulatory Visit (INDEPENDENT_AMBULATORY_CARE_PROVIDER_SITE_OTHER): Payer: BLUE CROSS/BLUE SHIELD | Admitting: Internal Medicine

## 2018-04-20 ENCOUNTER — Encounter: Payer: Self-pay | Admitting: Internal Medicine

## 2018-04-20 VITALS — BP 138/92 | HR 63 | Temp 98.6°F | Wt 315.0 lb

## 2018-04-20 DIAGNOSIS — I10 Essential (primary) hypertension: Secondary | ICD-10-CM | POA: Diagnosis not present

## 2018-04-20 DIAGNOSIS — I421 Obstructive hypertrophic cardiomyopathy: Secondary | ICD-10-CM

## 2018-04-20 MED ORDER — HYDROCODONE-ACETAMINOPHEN 5-325 MG PO TABS: 1.0000 | ORAL_TABLET | ORAL | 0 refills | Status: DC | PRN

## 2018-04-20 NOTE — Progress Notes (Signed)
Subjective:    Patient ID: Chad Lyons, male    DOB: 1966-06-23, 52 y.o.   MRN: [id number]  HPI  52 year old patient who presents with a 5-day history of chest congestion and cough.  A few days ago temperature was as high as 101 degrees.  He has been using Mucinex DM with some benefit but continues to have refractory cough it is interfering with sleep and causing some chest wall discomfort.  He is modestly improved  He does have a history of essential hypertension with LVH.  He was hospitalized last year for decompensated diastolic heart failure and concerns were raised for hypertrophic obstructive cardiomyopathy. He has had no cardiology follow-up.  Antihypertensive regimen includes valsartan and verapamil.  He also uses furosemide along with potassium supplementation  Past Medical History:  Diagnosis Date  . Diverticulitis    HX OF  . EKG, abnormal    HX OF LEFT ANTERIO FASCICULAR BLOCK ON 02-15-15 EKG  . GERD (gastroesophageal reflux disease)   . Heart murmur    NOW RESOLVED  . Hypertension   . OSA (obstructive sleep apnea) 11/27/2016  . Ventral incisional hernia      Social History   Socioeconomic History  . Marital status: Married    Spouse name: Not on file  . Number of children: Not on file  . Years of education: Not on file  . Highest education level: Not on file  Occupational History  . Occupation: Press photographer  Social Needs  . Financial resource strain: Not on file  . Food insecurity:    Worry: Not on file    Inability: Not on file  . Transportation needs:    Medical: Not on file    Non-medical: Not on file  Tobacco Use  . Smoking status: Current Every Day Smoker    Types: Cigars, Cigarettes    Last attempt to quit: 10/11/2004    Years since quitting: 13.5  . Smokeless tobacco: Never Used  . Tobacco comment: Current Cigars 1 every other day -----QUIT cigarettes in 2006>> 1/2PPD x 15 years  Substance and Sexual Activity  . Alcohol use: Yes    Alcohol/week:  3.0 - 4.0 standard drinks    Types: 3 - 4 Shots of liquor per week    Comment: socially;  . Drug use: No  . Sexual activity: Not on file    Comment: still smokes cigars  Lifestyle  . Physical activity:    Days per week: Not on file    Minutes per session: Not on file  . Stress: Not on file  Relationships  . Social connections:    Talks on phone: Not on file    Gets together: Not on file    Attends religious service: Not on file    Active member of club or organization: Not on file    Attends meetings of clubs or organizations: Not on file    Relationship status: Not on file  . Intimate partner violence:    Fear of current or ex partner: Not on file    Emotionally abused: Not on file    Physically abused: Not on file    Forced sexual activity: Not on file  Other Topics Concern  . Not on file  Social History Narrative  . Not on file    Past Surgical History:  Procedure Laterality Date  . COLON SURGERY  2006   colon resection for polyps  . COLONOSCOPY WITH PROPOFOL N/A 10/31/2016   Procedure: COLONOSCOPY WITH PROPOFOL;  Surgeon: Mauri Pole, MD;  Location: [facility];  Service: Endoscopy;  Laterality: N/A;  . HIATAL HERNIA REPAIR  1991  . INSERTION OF MESH N/A 02/20/2015   Procedure: INSERTION OF MESH;  Surgeon: Excell Seltzer, MD;  Location: [facility];  Service: General;  Laterality: N/A;  . LAPAROSCOPIC GASTRIC BANDING  2011  . VENTRAL HERNIA REPAIR N/A 02/20/2015   Procedure: OPEN REPAIR VENTRAL INCISIONAL HERNIA;  Surgeon: Excell Seltzer, MD;  Location: [facility];  Service: General;  Laterality: N/A;  . WISDOM TOOTH EXTRACTION      Family History  Problem Relation Age of Onset  . Heart disease Mother   . Heart disease Maternal Aunt   . Cancer Maternal Uncle        pancreatic  . Heart disease Maternal Uncle   . Rheum arthritis Maternal Grandmother   . Colon cancer Neg Hx     Allergies  Allergen Reactions  . Hydromet [Hydrocodone-Homatropine] Itching  .  Iodine-131 Itching  . Iohexol Hives and Itching    PT DEVELOPS ITCHING AND HIVES SEVERAL HOURS AFTER INJECTION OF OMNIPAQUE   . Penicillins Itching    Has patient had a PCN reaction causing immediate rash, facial/tongue/throat swelling, SOB or lightheadedness with hypotension: Yes Has patient had a PCN reaction causing severe rash involving mucus membranes or skin necrosis: No Has patient had a PCN reaction that required hospitalization: No Has patient had a PCN reaction occurring within the last 10 years: No If all of the above answers are "NO", then may proceed with Cephalosporin use.   . Red Dye Itching    Current Outpatient Medications on File Prior to Visit  Medication Sig Dispense Refill  . ALPRAZolam (XANAX) 0.5 MG tablet Take 1 tablet (0.5 mg total) by mouth daily as needed. 90 tablet 2  . furosemide (LASIX) 40 MG tablet TAKE 1 TABLET TWICE A DAY 180 tablet 3  . ibuprofen (ADVIL,MOTRIN) 800 MG tablet Take 1 tablet (800 mg total) by mouth 3 (three) times daily. 21 tablet 0  . phosphorus (K PHOS NEUTRAL) 155-852-130 MG tablet Take 2 tablets (500 mg total) by mouth 3 (three) times daily. 3 tablet 0  . potassium chloride SA (K-DUR,KLOR-CON) 20 MEQ tablet TAKE 1 TABLET DAILY 90 tablet 3  . sertraline (ZOLOFT) 50 MG tablet TAKE 1 TABLET DAILY (PHYSICAL SET UP FOR 06/30/16) 90 tablet 1  . triamcinolone cream (KENALOG) 0.1 % APPLY TOPICALLY TWICE A DAY AS DIRECTED. NEED OFFICE VISIT FOR FURTHER REFILLS. 30 g 0  . valsartan (DIOVAN) 80 MG tablet Take 1 tablet (80 mg total) by mouth daily. 90 tablet 4  . verapamil (CALAN-SR) 240 MG CR tablet TAKE 1 TABLET AT BEDTIME (PHYSICAL SET UP FOR 06/30/16) 90 tablet 1   No current facility-administered medications on file prior to visit.     BP (!) 138/92 (BP Location: Right Arm, Patient Position: Sitting, Cuff Size: Large)   Pulse 63   Temp 98.6 F (37 C) (Oral) Comment: Pt has taking OTC meds this a.m.  Wt (!) 315 lb (142.9 kg)   SpO2 95%    BMI 40.44 kg/m      Review of Systems  Constitutional: Positive for activity change, appetite change, fatigue and fever. Negative for chills.  HENT: Positive for congestion, postnasal drip and rhinorrhea. Negative for dental problem, ear pain, hearing loss, sore throat, tinnitus, trouble swallowing and voice change.   Eyes: Negative for pain, discharge and visual disturbance.  Respiratory: Positive for cough. Negative for chest  tightness, wheezing and stridor.   Cardiovascular: Positive for chest pain. Negative for palpitations and leg swelling.  Gastrointestinal: Negative for abdominal distention, abdominal pain, blood in stool, constipation, diarrhea, nausea and vomiting.  Genitourinary: Negative for difficulty urinating, discharge, flank pain, genital sores, hematuria and urgency.  Musculoskeletal: Negative for arthralgias, back pain, gait problem, joint swelling, myalgias and neck stiffness.  Skin: Negative for rash.  Neurological: Negative for dizziness, syncope, speech difficulty, weakness, numbness and headaches.  Hematological: Negative for adenopathy. Does not bruise/bleed easily.  Psychiatric/Behavioral: Negative for behavioral problems and dysphoric mood. The patient is not nervous/anxious.        Objective:   Physical Exam  Constitutional: He is oriented to person, place, and time. He appears well-developed.   Afebrile blood pressure 140/80.  Frequent paroxysms of coughing during the exam  HENT:  Head: Normocephalic.  Right Ear: External ear normal.  Left Ear: External ear normal.  Eyes: Conjunctivae and EOM are normal.  Neck: Normal range of motion.  Cardiovascular: Normal rate and normal heart sounds.  Pulmonary/Chest: Effort normal and breath sounds normal. No stridor. No respiratory distress. He has no wheezes.  Frequent vigorous paroxysms of coughing  Abdominal: Bowel sounds are normal.  Musculoskeletal: Normal range of motion. He exhibits no edema or tenderness.   Neurological: He is alert and oriented to person, place, and time.  Psychiatric: He has a normal mood and affect. His behavior is normal.          Assessment & Plan:   Viral URI with cough.  Will encourage hydration and liberal fluid intake.  Samples of Mucinex DM provided.  Will treat refractory cough with hydrocodone which she has tolerated well Essential hypertension LVH Rule out HOCM.  Patient will be set up for a cardiology evaluation may need cardiac MRI.  Patient is agreeable  Patient will establish with a new provider in 3 months Medications updated  Marletta Lor

## 2018-04-20 NOTE — Patient Instructions (Addendum)
Acute bronchitis symptoms for less than 10 days are generally not helped by antibiotics.  Take over-the-counter expectorants and cough medications such as  Mucinex DM.  Call if there is no improvement in 5 to 7 days or if  you develop worsening cough, fever, or new symptoms, such as shortness of breath or chest pain.  Hydrocodone 5 mg every 6 hours as needed for cough  Hydrate and Humidify  Drink enough water to keep your urine clear or pale yellow. Staying hydrated will help to thin your mucus.  Use a cool mist humidifier to keep the humidity level in your home above 50%.  Inhale steam for 10-15 minutes, 3-4 times a day or as told by your health care provider. You can do this in the bathroom while a hot shower is running.  Limit your exposure to cool or dry air. Rest  Rest as much as possible.

## 2018-04-26 ENCOUNTER — Ambulatory Visit (INDEPENDENT_AMBULATORY_CARE_PROVIDER_SITE_OTHER): Payer: BLUE CROSS/BLUE SHIELD | Admitting: Internal Medicine

## 2018-04-26 ENCOUNTER — Encounter: Payer: Self-pay | Admitting: Internal Medicine

## 2018-04-26 VITALS — BP 110/70 | HR 57 | Temp 98.6°F | Wt 320.2 lb

## 2018-04-26 DIAGNOSIS — I10 Essential (primary) hypertension: Secondary | ICD-10-CM | POA: Diagnosis not present

## 2018-04-26 DIAGNOSIS — J069 Acute upper respiratory infection, unspecified: Secondary | ICD-10-CM | POA: Diagnosis not present

## 2018-04-26 DIAGNOSIS — B9789 Other viral agents as the cause of diseases classified elsewhere: Secondary | ICD-10-CM | POA: Diagnosis not present

## 2018-04-26 NOTE — Patient Instructions (Signed)
  Acute bronchitis symptoms are generally not helped by antibiotics.  Take over-the-counter expectorants and cough medications such as  Mucinex DM.  Call if there is no improvement in 5 to 7 days or if  you develop worsening cough, fever, or new symptoms, such as shortness of breath or chest pain.  Cardiology consultation as planned

## 2018-04-26 NOTE — Progress Notes (Signed)
Subjective:    Patient ID: Chad Lyons, male    DOB: October 03, 1965, 52 y.o.   MRN: [id number]  HPI 52 year old patient who is seen today in follow-up.  He was seen 6 days ago and treated symptomatically for acute bronchitis.  He is improved but still having some cough sinus congestion generalized achiness.  Cough is worse at night. Patient has a history of diastolic heart failure concerns for hypertrophic cardiomyopathy and has been referred to cardiology for follow-up  Past Medical History:  Diagnosis Date  . Diverticulitis    HX OF  . EKG, abnormal    HX OF LEFT ANTERIO FASCICULAR BLOCK ON 02-15-15 EKG  . GERD (gastroesophageal reflux disease)   . Heart murmur    NOW RESOLVED  . Hypertension   . OSA (obstructive sleep apnea) 11/27/2016  . Ventral incisional hernia      Social History   Socioeconomic History  . Marital status: Married    Spouse name: Not on file  . Number of children: Not on file  . Years of education: Not on file  . Highest education level: Not on file  Occupational History  . Occupation: Press photographer  Social Needs  . Financial resource strain: Not on file  . Food insecurity:    Worry: Not on file    Inability: Not on file  . Transportation needs:    Medical: Not on file    Non-medical: Not on file  Tobacco Use  . Smoking status: Current Every Day Smoker    Types: Cigars, Cigarettes    Last attempt to quit: 10/11/2004    Years since quitting: 13.5  . Smokeless tobacco: Never Used  . Tobacco comment: Current Cigars 1 every other day -----QUIT cigarettes in 2006>> 1/2PPD x 15 years  Substance and Sexual Activity  . Alcohol use: Yes    Alcohol/week: 3.0 - 4.0 standard drinks    Types: 3 - 4 Shots of liquor per week    Comment: socially;  . Drug use: No  . Sexual activity: Not on file    Comment: still smokes cigars  Lifestyle  . Physical activity:    Days per week: Not on file    Minutes per session: Not on file  . Stress: Not on file    Relationships  . Social connections:    Talks on phone: Not on file    Gets together: Not on file    Attends religious service: Not on file    Active member of club or organization: Not on file    Attends meetings of clubs or organizations: Not on file    Relationship status: Not on file  . Intimate partner violence:    Fear of current or ex partner: Not on file    Emotionally abused: Not on file    Physically abused: Not on file    Forced sexual activity: Not on file  Other Topics Concern  . Not on file  Social History Narrative  . Not on file    Past Surgical History:  Procedure Laterality Date  . COLON SURGERY  2006   colon resection for polyps  . COLONOSCOPY WITH PROPOFOL N/A 10/31/2016   Procedure: COLONOSCOPY WITH PROPOFOL;  Surgeon: Mauri Pole, MD;  Location: [facility];  Service: Endoscopy;  Laterality: N/A;  . HIATAL HERNIA REPAIR  1991  . INSERTION OF MESH N/A 02/20/2015   Procedure: INSERTION OF MESH;  Surgeon: Excell Seltzer, MD;  Location: [facility];  Service: General;  Laterality: N/A;  . LAPAROSCOPIC GASTRIC BANDING  2011  . VENTRAL HERNIA REPAIR N/A 02/20/2015   Procedure: OPEN REPAIR VENTRAL INCISIONAL HERNIA;  Surgeon: Excell Seltzer, MD;  Location: [facility];  Service: General;  Laterality: N/A;  . WISDOM TOOTH EXTRACTION      Family History  Problem Relation Age of Onset  . Heart disease Mother   . Heart disease Maternal Aunt   . Cancer Maternal Uncle        pancreatic  . Heart disease Maternal Uncle   . Rheum arthritis Maternal Grandmother   . Colon cancer Neg Hx     Allergies  Allergen Reactions  . Hydromet [Hydrocodone-Homatropine] Itching  . Iodine-131 Itching  . Iohexol Hives and Itching    PT DEVELOPS ITCHING AND HIVES SEVERAL HOURS AFTER INJECTION OF OMNIPAQUE   . Penicillins Itching    Has patient had a PCN reaction causing immediate rash, facial/tongue/throat swelling, SOB or lightheadedness with hypotension: Yes Has patient  had a PCN reaction causing severe rash involving mucus membranes or skin necrosis: No Has patient had a PCN reaction that required hospitalization: No Has patient had a PCN reaction occurring within the last 10 years: No If all of the above answers are "NO", then may proceed with Cephalosporin use.   . Red Dye Itching    Current Outpatient Medications on File Prior to Visit  Medication Sig Dispense Refill  . ALPRAZolam (XANAX) 0.5 MG tablet Take 1 tablet (0.5 mg total) by mouth daily as needed. 90 tablet 2  . furosemide (LASIX) 40 MG tablet TAKE 1 TABLET TWICE A DAY 180 tablet 3  . HYDROcodone-acetaminophen (NORCO/VICODIN) 5-325 MG tablet Take 1 tablet by mouth every 4 (four) hours as needed. 30 tablet 0  . ibuprofen (ADVIL,MOTRIN) 800 MG tablet Take 1 tablet (800 mg total) by mouth 3 (three) times daily. 21 tablet 0  . phosphorus (K PHOS NEUTRAL) 155-852-130 MG tablet Take 2 tablets (500 mg total) by mouth 3 (three) times daily. 3 tablet 0  . potassium chloride SA (K-DUR,KLOR-CON) 20 MEQ tablet TAKE 1 TABLET DAILY 90 tablet 3  . sertraline (ZOLOFT) 50 MG tablet TAKE 1 TABLET DAILY (PHYSICAL SET UP FOR 06/30/16) 90 tablet 1  . triamcinolone cream (KENALOG) 0.1 % APPLY TOPICALLY TWICE A DAY AS DIRECTED. NEED OFFICE VISIT FOR FURTHER REFILLS. 30 g 24  . valsartan (DIOVAN) 80 MG tablet Take 1 tablet (80 mg total) by mouth daily. 90 tablet 4  . verapamil (CALAN-SR) 240 MG CR tablet TAKE 1 TABLET AT BEDTIME (PHYSICAL SET UP FOR 06/30/16) 90 tablet 1   No current facility-administered medications on file prior to visit.     BP 110/70 (BP Location: Right Arm, Patient Position: Sitting, Cuff Size: Large)   Pulse (!) 57   Temp 98.6 F (37 C) (Oral)   Wt (!) 320 lb 3.2 oz (145.2 kg)   SpO2 96%   BMI 41.11 kg/m      Review of Systems  Constitutional: Positive for activity change, appetite change and fatigue. Negative for chills and fever.  HENT: Positive for congestion, postnasal drip and  rhinorrhea. Negative for dental problem, ear pain, hearing loss, sore throat, tinnitus, trouble swallowing and voice change.   Eyes: Negative for pain, discharge and visual disturbance.  Respiratory: Positive for cough. Negative for chest tightness, wheezing and stridor.   Cardiovascular: Negative for chest pain, palpitations and leg swelling.  Gastrointestinal: Negative for abdominal distention, abdominal pain, blood in stool, constipation, diarrhea, nausea and vomiting.  Genitourinary: Negative for difficulty urinating, discharge, flank pain, genital sores, hematuria and urgency.  Musculoskeletal: Negative for arthralgias, back pain, gait problem, joint swelling, myalgias and neck stiffness.  Skin: Negative for rash.  Neurological: Negative for dizziness, syncope, speech difficulty, weakness, numbness and headaches.  Hematological: Negative for adenopathy. Does not bruise/bleed easily.  Psychiatric/Behavioral: Negative for behavioral problems and dysphoric mood. The patient is not nervous/anxious.        Objective:   Physical Exam  Constitutional: He is oriented to person, place, and time. He appears well-developed. No distress.  Afebrile Occasional paroxysms of cough Blood pressure stable  HENT:  Head: Normocephalic.  Right Ear: External ear normal.  Left Ear: External ear normal.  Eyes: Conjunctivae and EOM are normal.  Neck: Normal range of motion.  Cardiovascular: Normal rate and normal heart sounds.  No murmur  Pulmonary/Chest: Breath sounds normal.  Rare rhonchi only  Abdominal: Bowel sounds are normal.  Musculoskeletal: Normal range of motion. He exhibits no edema or tenderness.  Neurological: He is alert and oriented to person, place, and time.  Psychiatric: He has a normal mood and affect. His behavior is normal.          Assessment & Plan:   Resolving URI.  Will continue symptomatic treatment.  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  Patient states that he is on furosemide twice daily for fluid control.  Will decrease to once daily follow-up cardiology  Marletta Lor

## 2018-04-29 ENCOUNTER — Encounter: Payer: Self-pay | Admitting: Internal Medicine

## 2018-05-03 ENCOUNTER — Ambulatory Visit: Payer: BLUE CROSS/BLUE SHIELD | Admitting: Internal Medicine

## 2018-05-18 ENCOUNTER — Encounter: Payer: Self-pay | Admitting: Adult Health

## 2018-05-18 ENCOUNTER — Ambulatory Visit (INDEPENDENT_AMBULATORY_CARE_PROVIDER_SITE_OTHER): Payer: BLUE CROSS/BLUE SHIELD | Admitting: Adult Health

## 2018-05-18 VITALS — BP 138/72 | HR 77 | Temp 98.3°F | Wt 323.6 lb

## 2018-05-18 DIAGNOSIS — Z7689 Persons encountering health services in other specified circumstances: Secondary | ICD-10-CM | POA: Diagnosis not present

## 2018-05-18 DIAGNOSIS — G4733 Obstructive sleep apnea (adult) (pediatric): Secondary | ICD-10-CM | POA: Diagnosis not present

## 2018-05-18 DIAGNOSIS — I5031 Acute diastolic (congestive) heart failure: Secondary | ICD-10-CM

## 2018-05-18 DIAGNOSIS — J0141 Acute recurrent pansinusitis: Secondary | ICD-10-CM

## 2018-05-18 DIAGNOSIS — Z23 Encounter for immunization: Secondary | ICD-10-CM | POA: Diagnosis not present

## 2018-05-18 DIAGNOSIS — I10 Essential (primary) hypertension: Secondary | ICD-10-CM

## 2018-05-18 MED ORDER — FLUTICASONE PROPIONATE 50 MCG/ACT NA SUSP: 2.0000 | Freq: Every day | NASAL | 6 refills | Status: DC

## 2018-05-18 MED ORDER — DOXYCYCLINE HYCLATE 100 MG PO CAPS: 100.0000 mg | ORAL_CAPSULE | Freq: Two times a day (BID) | ORAL | 0 refills | Status: DC

## 2018-05-18 NOTE — Progress Notes (Signed)
Patient presents to clinic today to establish care. He is a pleasant 52 year old male who  has a past medical history of Diverticulitis, EKG, abnormal, GERD (gastroesophageal reflux disease), Heart murmur, Hypertension, OSA (obstructive sleep apnea) (11/27/2016), and Ventral incisional hernia.  He is a previous patient of Dr. Raliegh Ip who is transferring care  His last CPE was in 2017   Acute Concerns: Establish Care  Sinusitis - Reports 2-3 weeks of nasal congestion, sinus pain and pressure, and fatigue. Denies any fevers, chills, n/v/d.He has been using claritin and mucinex without relief.   Chronic Issues: Essential Hypertension -Controlled with Diovan 80 mg daily, verapamil 240 mg nightly, and Lasix 40 mg BID  CHF - Is not seen by Cardiology currently. Last Echo was in 02/2017 which showed Normal LV systolic function; severe LVH; moderate diastolic dysfunction; sclerotic aortic valve with trace AI; mild MR; severe LAE; mild TR. He is going to call Cardiology and schedule a visit.   OSA - Is followed by Pulmonary. He uses his CPAP nightly.   Insomnia - Takes Xanax 0.5 mg PRN. Reports infrequent use. He [place] out of the country a lot and mostly uses when he returns so that he can get back on a regular sleep schedule.   Obesity- Has been working on weight loss through diet and exercise. Has had gastric bypass in the past.  Wt Readings from Last 3 Encounters:  05/18/18 (!) 323 lb 9.6 oz (146.8 kg)  04/26/18 (!) 320 lb 3.2 oz (145.2 kg)  04/20/18 (!) 315 lb (142.9 kg)   Anxiety/Depression - Well controlled on Zoloft   Health Maintenance: Dental -- Routine Vision -- Routine  Immunizations -- Needs flu  Colonoscopy -- 2018  Diet: Eats a heart healthy diet Exercise: Has been walking a lot   Past Medical History:  Diagnosis Date  . Diverticulitis    HX OF  . EKG, abnormal    HX OF LEFT ANTERIO FASCICULAR BLOCK ON 02-15-15 EKG  . GERD (gastroesophageal reflux disease)   . Heart  murmur    NOW RESOLVED  . Hypertension   . OSA (obstructive sleep apnea) 11/27/2016  . Ventral incisional hernia     Past Surgical History:  Procedure Laterality Date  . COLON SURGERY  2006   colon resection for polyps  . COLONOSCOPY WITH PROPOFOL N/A 10/31/2016   Procedure: COLONOSCOPY WITH PROPOFOL;  Surgeon: Mauri Pole, MD;  Location: [facility];  Service: Endoscopy;  Laterality: N/A;  . HIATAL HERNIA REPAIR  1991  . INSERTION OF MESH N/A 02/20/2015   Procedure: INSERTION OF MESH;  Surgeon: Excell Seltzer, MD;  Location: [facility];  Service: General;  Laterality: N/A;  . LAPAROSCOPIC GASTRIC BANDING  2011  . VENTRAL HERNIA REPAIR N/A 02/20/2015   Procedure: OPEN REPAIR VENTRAL INCISIONAL HERNIA;  Surgeon: Excell Seltzer, MD;  Location: [facility];  Service: General;  Laterality: N/A;  . WISDOM TOOTH EXTRACTION      Current Outpatient Medications on File Prior to Visit  Medication Sig Dispense Refill  . ALPRAZolam (XANAX) 0.5 MG tablet Take 1 tablet (0.5 mg total) by mouth daily as needed. 90 tablet 2  . furosemide (LASIX) 40 MG tablet TAKE 1 TABLET TWICE A DAY 180 tablet 3  . HYDROcodone-acetaminophen (NORCO/VICODIN) 5-325 MG tablet Take 1 tablet by mouth every 4 (four) hours as needed. 30 tablet 0  . ibuprofen (ADVIL,MOTRIN) 800 MG tablet Take 1 tablet (800 mg total) by mouth 3 (three) times daily. 21 tablet  0  . phosphorus (K PHOS NEUTRAL) 155-852-130 MG tablet Take 2 tablets (500 mg total) by mouth 3 (three) times daily. 3 tablet 0  . potassium chloride SA (K-DUR,KLOR-CON) 20 MEQ tablet TAKE 1 TABLET DAILY 90 tablet 3  . sertraline (ZOLOFT) 50 MG tablet TAKE 1 TABLET DAILY (PHYSICAL SET UP FOR 06/30/16) 90 tablet 1  . triamcinolone cream (KENALOG) 0.1 % APPLY TOPICALLY TWICE A DAY AS DIRECTED. NEED OFFICE VISIT FOR FURTHER REFILLS. 30 g 24  . valsartan (DIOVAN) 80 MG tablet Take 1 tablet (80 mg total) by mouth daily. 90 tablet 4  . verapamil (CALAN-SR) 240 MG CR tablet TAKE  1 TABLET AT BEDTIME (PHYSICAL SET UP FOR 06/30/16) 90 tablet 1   No current facility-administered medications on file prior to visit.     Allergies  Allergen Reactions  . Hydromet [Hydrocodone-Homatropine] Itching  . Iodine-131 Itching  . Iohexol Hives and Itching    PT DEVELOPS ITCHING AND HIVES SEVERAL HOURS AFTER INJECTION OF OMNIPAQUE   . Penicillins Itching    Has patient had a PCN reaction causing immediate rash, facial/tongue/throat swelling, SOB or lightheadedness with hypotension: Yes Has patient had a PCN reaction causing severe rash involving mucus membranes or skin necrosis: No Has patient had a PCN reaction that required hospitalization: No Has patient had a PCN reaction occurring within the last 10 years: No If all of the above answers are "NO", then may proceed with Cephalosporin use.   . Red Dye Itching    Family History  Problem Relation Age of Onset  . Heart disease Mother   . Heart disease Maternal Aunt   . Cancer Maternal Uncle        pancreatic  . Heart disease Maternal Uncle   . Rheum arthritis Maternal Grandmother   . Colon cancer Neg Hx     Social History   Socioeconomic History  . Marital status: Married    Spouse name: Not on file  . Number of children: Not on file  . Years of education: Not on file  . Highest education level: Not on file  Occupational History  . Occupation: Press photographer  Social Needs  . Financial resource strain: Not on file  . Food insecurity:    Worry: Not on file    Inability: Not on file  . Transportation needs:    Medical: Not on file    Non-medical: Not on file  Tobacco Use  . Smoking status: Current Every Day Smoker    Types: Cigars, Cigarettes    Last attempt to quit: 10/11/2004    Years since quitting: 13.6  . Smokeless tobacco: Never Used  . Tobacco comment: Current Cigars 1 every other day -----QUIT cigarettes in 2006>> 1/2PPD x 15 years  Substance and Sexual Activity  . Alcohol use: Yes    Alcohol/week: 3.0 -  4.0 standard drinks    Types: 3 - 4 Shots of liquor per week    Comment: socially;  . Drug use: No  . Sexual activity: Not on file    Comment: still smokes cigars  Lifestyle  . Physical activity:    Days per week: Not on file    Minutes per session: Not on file  . Stress: Not on file  Relationships  . Social connections:    Talks on phone: Not on file    Gets together: Not on file    Attends religious service: Not on file    Active member of club or organization: Not on file  Attends meetings of clubs or organizations: Not on file    Relationship status: Not on file  . Intimate partner violence:    Fear of current or ex partner: Not on file    Emotionally abused: Not on file    Physically abused: Not on file    Forced sexual activity: Not on file  Other Topics Concern  . Not on file  Social History Narrative  . Not on file    Review of Systems  Constitutional: Positive for malaise/fatigue.  HENT: Positive for congestion and sinus pain. Negative for sore throat.   Eyes: Negative.   Respiratory: Negative.   Cardiovascular: Negative.   Gastrointestinal: Negative.   Genitourinary: Negative.   Musculoskeletal: Negative.   Skin: Negative.   Neurological: Negative.   Endo/Heme/Allergies: Negative.   Psychiatric/Behavioral: Negative.   All other systems reviewed and are negative.   There were no vitals taken for this visit.  Physical Exam  Constitutional: He is oriented to person, place, and time. He appears well-developed and well-nourished. No distress.  Obese   HENT:  Head: Normocephalic and atraumatic.  Right Ear: Hearing, tympanic membrane, external ear and ear canal normal.  Left Ear: Hearing, tympanic membrane, external ear and ear canal normal.  Nose: Mucosal edema and rhinorrhea present. Right sinus exhibits maxillary sinus tenderness and frontal sinus tenderness. Left sinus exhibits frontal sinus tenderness.  Mouth/Throat: Uvula is midline, oropharynx is  clear and moist and mucous membranes are normal. No oropharyngeal exudate.  Eyes: Pupils are equal, round, and reactive to light. Conjunctivae and EOM are normal. Right eye exhibits no discharge. Left eye exhibits no discharge. No scleral icterus.  Neck: Normal range of motion. Neck supple. No JVD present. No tracheal deviation present. No thyromegaly present.  Cardiovascular: Normal rate, regular rhythm, normal heart sounds and intact distal pulses. Exam reveals no gallop and no friction rub.  No murmur heard. Pulmonary/Chest: Effort normal and breath sounds normal. No stridor. No respiratory distress. He has no wheezes. He has no rales. He exhibits no tenderness.  Musculoskeletal: Normal range of motion. He exhibits no edema, tenderness or deformity.  Lymphadenopathy:    He has no cervical adenopathy.  Neurological: He is alert and oriented to person, place, and time. He displays normal reflexes. No cranial nerve deficit or sensory deficit. He exhibits normal muscle tone. Coordination normal.  Skin: Skin is warm and dry. Capillary refill takes less than 2 seconds. No rash noted. He is not diaphoretic. No erythema. No pallor.  Psychiatric: He has a normal mood and affect. His behavior is normal. Judgment and thought content normal.  Nursing note and vitals reviewed.  Assessment/Plan: 1. Encounter to establish care - Follow up for CPE  - Follow up sooner if needed - Continue with current medication and treatment plans   2. Acute recurrent pansinusitis  - doxycycline (VIBRAMYCIN) 100 MG capsule; Take 1 capsule (100 mg total) by mouth 2 (two) times daily.  Dispense: 14 capsule; Refill: 0 - fluticasone (FLONASE) 50 MCG/ACT nasal spray; Place 2 sprays into both nostrils daily.  Dispense: 16 g; Refill: 6  3. Needs flu shot - Flu Vaccine QUAD 6+ mos PF IM (Fluarix Quad PF)  4. Acute diastolic CHF (congestive heart failure) ([place]) - Encouraged Cardiology follow up   5. OSA (obstructive sleep  apnea) - Great compliance with cpapd   6. Essential hypertension - No change in medications   7. OBESITY, MORBID - Encouraged to continue with diet and exercise   [name],  NP

## 2018-06-17 DIAGNOSIS — H5711 Ocular pain, right eye: Secondary | ICD-10-CM | POA: Diagnosis not present

## 2018-06-19 ENCOUNTER — Other Ambulatory Visit: Payer: Self-pay | Admitting: Internal Medicine

## 2018-06-29 ENCOUNTER — Other Ambulatory Visit: Payer: Self-pay | Admitting: Adult Health

## 2018-06-29 MED ORDER — SERTRALINE HCL 50 MG PO TABS: ORAL_TABLET | ORAL | 0 refills | Status: DC

## 2018-06-29 MED ORDER — VERAPAMIL HCL ER 240 MG PO TBCR: EXTENDED_RELEASE_TABLET | ORAL | 0 refills | Status: DC

## 2018-06-29 NOTE — Telephone Encounter (Signed)
Copied from [place] [id number]. Topic: Quick Communication - Rx Refill/Question >> Jun 29, 2018  9:30 AM Leward Quan A wrote: Medication: verapamil (CALAN-SR) 240 MG CR tablet,   sertraline (ZOLOFT) 50 MG tablet   Has the patient contacted their pharmacy? Yes.     Preferred Pharmacy (with phone number or street name): [facility], [place] [phone] (Phone) [phone] (Fax)    Agent: Please be advised that RX refills may take up to 3 business days. We ask that you follow-up with your pharmacy.

## 2018-06-29 NOTE — Telephone Encounter (Signed)
Requested Prescriptions  Pending Prescriptions Disp Refills  . verapamil (CALAN-SR) 240 MG CR tablet 90 tablet 0    Sig: Take 1 tablet by mouth at bedtime     Off-Protocol Failed - 06/29/2018  9:36 AM      Failed - Medication not assigned to a protocol, review manually.      Passed - Valid encounter within last 12 months    Recent Outpatient Visits          1 month ago Acute recurrent pansinusitis   [facility] [place], NP   2 months ago Viral URI with cough   [facility], Doretha Sou, MD   2 months ago Essential hypertension   [facility], Doretha Sou, MD   6 months ago Essential hypertension   [facility]rap[facility], Doretha Sou, MD   1 year ago Need for influenza vaccination   [place] [facility], Doretha Sou, MD      Future Appointments            In 4 weeks Nafziger, Tommi Rumps, NP [facility] at [place], [facility]         . sertraline (ZOLOFT) 50 MG tablet 90 tablet 0    Sig: Take 1 Tablet Daily     Psychiatry:  Antidepressants - SSRI Passed - 06/29/2018  9:36 AM      Passed - Valid encounter within last 6 months    Recent Outpatient Visits          1 month ago Acute recurrent pansinusitis   Therapist[facility] at [place], NP   2 months ago Viral URI with cough   Therap[facility], Doretha Sou, MD   2 months ago Essential hypertension   Therap[facility], Doretha Sou, MD   6 months ago Essential hypertension   Therapist[facility], Doretha Sou, MD   1 year ago Need for influenza vaccination   [place] [facility], Doretha Sou, MD      Future Appointments            In 4 weeks [place] Cater, Tommi Rumps, NP [employer] at [place], [facility]         has CPE scheduled for 07/27/18

## 2018-07-06 ENCOUNTER — Ambulatory Visit (INDEPENDENT_AMBULATORY_CARE_PROVIDER_SITE_OTHER): Payer: BLUE CROSS/BLUE SHIELD

## 2018-07-06 ENCOUNTER — Encounter (HOSPITAL_COMMUNITY): Payer: Self-pay | Admitting: Emergency Medicine

## 2018-07-06 ENCOUNTER — Ambulatory Visit (HOSPITAL_COMMUNITY)
Admission: EM | Admit: 2018-07-06 | Discharge: 2018-07-06 | Disposition: A | Discharge status: Home / Self Care | Payer: BLUE CROSS/BLUE SHIELD | Attending: Family Medicine | Admitting: Family Medicine

## 2018-07-06 ENCOUNTER — Other Ambulatory Visit: Payer: Self-pay

## 2018-07-06 DIAGNOSIS — M79672 Pain in left foot: Secondary | ICD-10-CM | POA: Diagnosis not present

## 2018-07-06 DIAGNOSIS — M79671 Pain in right foot: Secondary | ICD-10-CM | POA: Diagnosis not present

## 2018-07-06 DIAGNOSIS — M7989 Other specified soft tissue disorders: Secondary | ICD-10-CM | POA: Diagnosis not present

## 2018-07-06 DIAGNOSIS — M25572 Pain in left ankle and joints of left foot: Secondary | ICD-10-CM

## 2018-07-06 DIAGNOSIS — S99921A Unspecified injury of right foot, initial encounter: Secondary | ICD-10-CM | POA: Diagnosis not present

## 2018-07-06 DIAGNOSIS — R2242 Localized swelling, mass and lump, left lower limb: Secondary | ICD-10-CM | POA: Diagnosis not present

## 2018-07-06 DIAGNOSIS — S91002A Unspecified open wound, left ankle, initial encounter: Secondary | ICD-10-CM

## 2018-07-06 DIAGNOSIS — S99912A Unspecified injury of left ankle, initial encounter: Secondary | ICD-10-CM | POA: Diagnosis not present

## 2018-07-06 DIAGNOSIS — S99922A Unspecified injury of left foot, initial encounter: Secondary | ICD-10-CM | POA: Diagnosis not present

## 2018-07-06 MED ORDER — NAPROXEN 375 MG PO TABS: 375.0000 mg | ORAL_TABLET | Freq: Two times a day (BID) | ORAL | 0 refills | Status: DC | PRN

## 2018-07-06 MED ORDER — DOXYCYCLINE HYCLATE 100 MG PO CAPS: 100.0000 mg | ORAL_CAPSULE | Freq: Two times a day (BID) | ORAL | 0 refills | Status: DC

## 2018-07-06 NOTE — Discharge Instructions (Addendum)
X-rays did not show fracture or dislocation Boot placed.   Continue conservative management of rest, ice, elevation, and gentle stretches Take naproxen as needed for pain relief (may cause abdominal discomfort, ulcers, and GI bleeds avoid taking with other NSAIDs) Keep wound covered.  Wash twice daily with soap and water.  Antibiotic prescribed.  Take as directed and to completion.   Follow up with orthopedist to be reevaluated  Return or go to the ER if you have any new or worsening symptoms (fever, chills, chest pain, abdominal pain, changes in bowel or bladder habits, pain radiating into lower legs, etc...)
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
## 2018-07-27 ENCOUNTER — Encounter: Payer: BLUE CROSS/BLUE SHIELD | Admitting: Adult Health

## 2018-07-30 DIAGNOSIS — S8782XA Crushing injury of left lower leg, initial encounter: Secondary | ICD-10-CM | POA: Diagnosis not present

## 2018-08-27 DIAGNOSIS — S8782XD Crushing injury of left lower leg, subsequent encounter: Secondary | ICD-10-CM | POA: Diagnosis not present

## 2018-09-01 ENCOUNTER — Other Ambulatory Visit: Payer: Self-pay | Admitting: Family Medicine

## 2018-09-01 DIAGNOSIS — I421 Obstructive hypertrophic cardiomyopathy: Secondary | ICD-10-CM

## 2018-09-01 NOTE — Progress Notes (Signed)
a 

## 2018-09-03 ENCOUNTER — Encounter: Payer: Self-pay | Admitting: Adult Health

## 2018-09-03 ENCOUNTER — Ambulatory Visit (INDEPENDENT_AMBULATORY_CARE_PROVIDER_SITE_OTHER): Payer: BLUE CROSS/BLUE SHIELD | Admitting: Adult Health

## 2018-09-03 VITALS — BP 140/90 | Temp 98.5°F | Ht 74.5 in | Wt 328.0 lb

## 2018-09-03 DIAGNOSIS — Z125 Encounter for screening for malignant neoplasm of prostate: Secondary | ICD-10-CM

## 2018-09-03 DIAGNOSIS — Z9884 Bariatric surgery status: Secondary | ICD-10-CM

## 2018-09-03 DIAGNOSIS — Z0000 Encounter for general adult medical examination without abnormal findings: Secondary | ICD-10-CM

## 2018-09-03 DIAGNOSIS — I5031 Acute diastolic (congestive) heart failure: Secondary | ICD-10-CM

## 2018-09-03 DIAGNOSIS — I10 Essential (primary) hypertension: Secondary | ICD-10-CM

## 2018-09-03 DIAGNOSIS — L03116 Cellulitis of left lower limb: Secondary | ICD-10-CM

## 2018-09-03 DIAGNOSIS — G4733 Obstructive sleep apnea (adult) (pediatric): Secondary | ICD-10-CM

## 2018-09-03 LAB — COMPREHENSIVE METABOLIC PANEL
ALT: 20 U/L (ref 0–53)
AST: 23 U/L (ref 0–37)
Albumin: 4.3 g/dL (ref 3.5–5.2)
Alkaline Phosphatase: 74 U/L (ref 39–117)
BUN: 16 mg/dL (ref 6–23)
CO2: 27 mEq/L (ref 19–32)
Calcium: 9.9 mg/dL (ref 8.4–10.5)
Chloride: 103 mEq/L (ref 96–112)
Creatinine, Ser: 0.95 mg/dL (ref 0.40–1.50)
GFR: 100.18 mL/min (ref >60.00)
Glucose, Bld: 88 mg/dL (ref 70–99)
Potassium: 4.2 mEq/L (ref 3.5–5.1)
Sodium: 140 mEq/L (ref 135–145)
Total Bilirubin: 0.7 mg/dL (ref 0.2–1.2)
Total Protein: 7.3 g/dL (ref 6.0–8.3)

## 2018-09-03 LAB — CBC WITH DIFFERENTIAL/PLATELET
Basophils Absolute: 0.1 10*3/uL (ref 0.0–0.1)
Basophils Relative: 0.6 % (ref 0.0–3.0)
Eosinophils Absolute: 0.2 10*3/uL (ref 0.0–0.7)
Eosinophils Relative: 1.8 % (ref 0.0–5.0)
HCT: 43.9 % (ref 39.0–52.0)
Hemoglobin: 14.5 g/dL (ref 13.0–17.0)
Lymphocytes Relative: 13.4 % (ref 12.0–46.0)
Lymphs Abs: 1.2 10*3/uL (ref 0.7–4.0)
MCHC: 33 g/dL (ref 30.0–36.0)
MCV: 92.2 fl (ref 78.0–100.0)
Monocytes Absolute: 0.9 10*3/uL (ref 0.1–1.0)
Monocytes Relative: 10.8 % (ref 3.0–12.0)
Neutro Abs: 6.4 10*3/uL (ref 1.4–7.7)
Neutrophils Relative %: 73.4 % (ref 43.0–77.0)
Platelets: 241 10*3/uL (ref 150.0–400.0)
RBC: 4.76 Mil/uL (ref 4.22–5.81)
RDW: 14.7 % (ref 11.5–15.5)
WBC: 8.7 10*3/uL (ref 4.0–10.5)

## 2018-09-03 LAB — BRAIN NATRIURETIC PEPTIDE: Pro B Natriuretic peptide (BNP): 281 pg/mL — ABNORMAL HIGH (ref 0.0–100.0)

## 2018-09-03 LAB — PSA: PSA: 0.34 ng/mL (ref 0.10–4.00)

## 2018-09-03 LAB — VITAMIN D 25 HYDROXY (VIT D DEFICIENCY, FRACTURES): VITD: 19.71 ng/mL — ABNORMAL LOW (ref 30.00–100.00)

## 2018-09-03 LAB — LIPID PANEL
Cholesterol: 134 mg/dL (ref 0–200)
HDL: 50.4 mg/dL (ref >39.00)
LDL Cholesterol: 60 mg/dL (ref 0–99)
NonHDL: 83.46
Total CHOL/HDL Ratio: 3
Triglycerides: 116 mg/dL (ref 0.0–149.0)
VLDL: 23.2 mg/dL (ref 0.0–40.0)

## 2018-09-03 LAB — TSH: TSH: 0.53 u[IU]/mL (ref 0.35–4.50)

## 2018-09-03 LAB — FOLATE: Folate: 7.1 ng/mL (ref >5.9)

## 2018-09-03 LAB — VITAMIN B12: Vitamin B-12: 366 pg/mL (ref 211–911)

## 2018-09-03 LAB — HEMOGLOBIN A1C: Hgb A1c MFr Bld: 5.2 % (ref 4.6–6.5)

## 2018-09-03 MED ORDER — DOXYCYCLINE HYCLATE 100 MG PO CAPS: 100.0000 mg | ORAL_CAPSULE | Freq: Two times a day (BID) | ORAL | 0 refills | Status: DC
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
## 2018-09-08 ENCOUNTER — Encounter: Payer: Self-pay | Admitting: Interventional Cardiology

## 2018-09-08 ENCOUNTER — Encounter: Payer: Self-pay | Admitting: Adult Health

## 2018-09-08 ENCOUNTER — Ambulatory Visit: Payer: BLUE CROSS/BLUE SHIELD | Admitting: Interventional Cardiology

## 2018-09-08 ENCOUNTER — Ambulatory Visit (INDEPENDENT_AMBULATORY_CARE_PROVIDER_SITE_OTHER): Payer: BLUE CROSS/BLUE SHIELD | Admitting: Interventional Cardiology

## 2018-09-08 VITALS — BP 140/98 | HR 63 | Ht 74.5 in | Wt 328.8 lb

## 2018-09-08 DIAGNOSIS — M7989 Other specified soft tissue disorders: Secondary | ICD-10-CM

## 2018-09-08 DIAGNOSIS — I5032 Chronic diastolic (congestive) heart failure: Secondary | ICD-10-CM | POA: Diagnosis not present

## 2018-09-08 DIAGNOSIS — I517 Cardiomegaly: Secondary | ICD-10-CM | POA: Diagnosis not present

## 2018-09-08 DIAGNOSIS — I110 Hypertensive heart disease with heart failure: Secondary | ICD-10-CM

## 2018-09-08 LAB — IRON,TIBC AND FERRITIN PANEL
%SAT: 23 % (calc) (ref 20–48)
Ferritin: 110 ng/mL (ref 38–380)
Iron: 88 ug/dL (ref 50–180)
TIBC: 387 mcg/dL (calc) (ref 250–425)

## 2018-09-08 LAB — VITAMIN B1: Vitamin B1 (Thiamine): <6 nmol/L — ABNORMAL LOW (ref 8–30)

## 2018-09-08 NOTE — Patient Instructions (Signed)
Medication Instructions:  Your physician recommends that you continue on your current medications as directed. Please refer to the Current Medication list given to you today.  If you need a refill on your cardiac medications before your next appointment, please call your pharmacy.   Lab work: None Ordered  If you have labs (blood work) drawn today and your tests are completely normal, you will receive your results only by: [name] MyChart Message (if you have MyChart) OR . A paper copy in the mail If you have any lab test that is abnormal or we need to change your treatment, we will call you to review the results.  Testing/Procedures: Your physician has requested that you have a lower extremity venous duplex. This test is an ultrasound of the veins in the legs. It looks at venous blood flow that carries blood from the heart to the legs. Allow one hour for a Lower Venous exam. There are no restrictions or special instructions.  Follow-Up: At [facility], you and your health needs are our priority.  As part of our continuing mission to provide you with exceptional heart care, we have created designated Provider Care Teams.  These Care Teams include your primary Cardiologist (physician) and Advanced Practice Providers (APPs -  Physician Assistants and Nurse Practitioners) who all work together to provide you with the care you need, when you need it. . You will need a follow up appointment in 6 months.  Please call our office 2 months in advance to schedule this appointment.  You may see Casandra Doffing, MD or one of the following Advanced Practice Providers on your designated Care Team:   . Lyda Jester, PA-C . Dayna Dunn, PA-C . Ermalinda Barrios, PA-C  Any Other Special Instructions Will Be Listed Below (If Applicable).
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
## 2018-09-09 ENCOUNTER — Ambulatory Visit (HOSPITAL_COMMUNITY)
Admission: RE | Admit: 2018-09-09 | Discharge: 2018-09-09 | Disposition: A | Discharge status: Home / Self Care | Payer: BLUE CROSS/BLUE SHIELD | Source: Ambulatory Visit | Attending: Cardiovascular Disease | Admitting: Cardiovascular Disease

## 2018-09-09 ENCOUNTER — Other Ambulatory Visit: Payer: Self-pay | Admitting: Adult Health

## 2018-09-09 DIAGNOSIS — M7989 Other specified soft tissue disorders: Secondary | ICD-10-CM

## 2018-09-09 MED ORDER — VITAMIN B-1 100 MG PO TABS: 100.0000 mg | ORAL_TABLET | Freq: Every day | ORAL | 3 refills | Status: DC

## 2018-09-14 NOTE — Telephone Encounter (Signed)
Chad Lyons "Chad Lyons" (Patient) Chad Lyons "Chad Lyons" (Patient) General - Inquiry  Summary: MyChart Message  Reason for CRM: Pt called and stated he sent a MyChart message today to [facility] that can be disregarded. Tommi Rumps has helped him get scheduled.

## 2018-09-16 ENCOUNTER — Telehealth: Payer: Self-pay | Admitting: Adult Health

## 2018-09-16 DIAGNOSIS — S81802A Unspecified open wound, left lower leg, initial encounter: Secondary | ICD-10-CM

## 2018-09-16 MED ORDER — CEPHALEXIN 500 MG PO CAPS: 500.0000 mg | ORAL_CAPSULE | Freq: Four times a day (QID) | ORAL | 0 refills | Status: AC
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
## 2018-09-21 ENCOUNTER — Ambulatory Visit (INDEPENDENT_AMBULATORY_CARE_PROVIDER_SITE_OTHER): Payer: BLUE CROSS/BLUE SHIELD

## 2018-09-21 DIAGNOSIS — S91002A Unspecified open wound, left ankle, initial encounter: Secondary | ICD-10-CM | POA: Diagnosis not present

## 2018-09-21 DIAGNOSIS — S81802A Unspecified open wound, left lower leg, initial encounter: Secondary | ICD-10-CM | POA: Diagnosis not present

## 2018-09-21 MED ORDER — OXYCODONE-ACETAMINOPHEN 10-325 MG PO TABS: 1.0000 | ORAL_TABLET | Freq: Three times a day (TID) | ORAL | 0 refills | Status: DC | PRN

## 2018-09-21 NOTE — Telephone Encounter (Signed)
Spoke to patient about wound on left foot. He is in pretty severe pain and is being seen by wound care in two days.   Will send in short course of Percocet

## 2018-09-23 ENCOUNTER — Encounter (HOSPITAL_BASED_OUTPATIENT_CLINIC_OR_DEPARTMENT_OTHER): Payer: BLUE CROSS/BLUE SHIELD | Attending: Internal Medicine

## 2018-09-23 DIAGNOSIS — G4730 Sleep apnea, unspecified: Secondary | ICD-10-CM | POA: Diagnosis not present

## 2018-09-23 DIAGNOSIS — I110 Hypertensive heart disease with heart failure: Secondary | ICD-10-CM | POA: Insufficient documentation

## 2018-09-23 DIAGNOSIS — I890 Lymphedema, not elsewhere classified: Secondary | ICD-10-CM | POA: Insufficient documentation

## 2018-09-23 DIAGNOSIS — I5032 Chronic diastolic (congestive) heart failure: Secondary | ICD-10-CM | POA: Insufficient documentation

## 2018-09-23 DIAGNOSIS — F17290 Nicotine dependence, other tobacco product, uncomplicated: Secondary | ICD-10-CM | POA: Diagnosis not present

## 2018-09-23 DIAGNOSIS — L97322 Non-pressure chronic ulcer of left ankle with fat layer exposed: Secondary | ICD-10-CM | POA: Diagnosis not present

## 2018-09-23 DIAGNOSIS — L97525 Non-pressure chronic ulcer of other part of left foot with muscle involvement without evidence of necrosis: Secondary | ICD-10-CM | POA: Diagnosis not present

## 2018-09-23 DIAGNOSIS — I872 Venous insufficiency (chronic) (peripheral): Secondary | ICD-10-CM | POA: Diagnosis not present

## 2018-09-27 ENCOUNTER — Ambulatory Visit (INDEPENDENT_AMBULATORY_CARE_PROVIDER_SITE_OTHER): Payer: BLUE CROSS/BLUE SHIELD | Admitting: Family Medicine

## 2018-09-27 ENCOUNTER — Encounter: Payer: Self-pay | Admitting: Family Medicine

## 2018-09-27 VITALS — BP 124/80 | HR 94 | Temp 101.8°F | Wt 316.1 lb

## 2018-09-27 DIAGNOSIS — N390 Urinary tract infection, site not specified: Secondary | ICD-10-CM

## 2018-09-27 DIAGNOSIS — R52 Pain, unspecified: Secondary | ICD-10-CM | POA: Diagnosis not present

## 2018-09-27 DIAGNOSIS — R300 Dysuria: Secondary | ICD-10-CM | POA: Diagnosis not present

## 2018-09-27 DIAGNOSIS — B349 Viral infection, unspecified: Secondary | ICD-10-CM | POA: Diagnosis not present

## 2018-09-27 LAB — POCT URINALYSIS DIPSTICK
Blood, UA: NEGATIVE
Glucose, UA: NEGATIVE
Nitrite, UA: NEGATIVE
Protein, UA: POSITIVE — AB
Spec Grav, UA: 1.01 (ref 1.010–1.025)
Urobilinogen, UA: 2 E.U./dL — AB
pH, UA: 6.5 (ref 5.0–8.0)

## 2018-09-27 LAB — POCT INFLUENZA A/B
Influenza A, POC: NEGATIVE
Influenza B, POC: NEGATIVE

## 2018-09-27 MED ORDER — CIPROFLOXACIN HCL 500 MG PO TABS: 500.0000 mg | ORAL_TABLET | Freq: Two times a day (BID) | ORAL | 0 refills | Status: DC

## 2018-09-27 NOTE — Addendum Note (Signed)
Addended by: Gwynne Edinger on: 09/27/2018 12:53 PM   Modules accepted: Orders

## 2018-09-27 NOTE — Progress Notes (Signed)
   Subjective:    Patient ID: Chad Lyons, male    DOB: [birth year], 53 y.o.   MRN: [id number]  HPI Here for 2 days of very different symptoms. Yesterday he developed fever, body aches, and a headache. No cough or ST. His wife has had similar symptoms for 3 days, she thinks they caught something from their grandchildren. Then today he noticed urgency to urinate and some burning. In addition he has been taking Doxycycline from the [facility] to treat an infection on the left lower leg. He is drinking plenty of fluids.    Review of Systems  Constitutional: Positive for fever.  HENT: Positive for congestion. Negative for postnasal drip, sinus pressure, sinus pain and sore throat.   Eyes: Negative.   Respiratory: Negative.   Cardiovascular: Negative.   Gastrointestinal: Negative.   Genitourinary: Positive for dysuria, frequency and urgency. Negative for flank pain and hematuria.  Musculoskeletal: Positive for myalgias.  Neurological: Positive for headaches.       Objective:   Physical Exam Constitutional:      Appearance: Normal appearance.  HENT:     Right Ear: Tympanic membrane and ear canal normal.     Left Ear: Tympanic membrane and ear canal normal.     Nose: Nose normal.     Mouth/Throat:     Pharynx: Oropharynx is clear.  Eyes:     Conjunctiva/sclera: Conjunctivae normal.  Cardiovascular:     Rate and Rhythm: Normal rate and regular rhythm.     Pulses: Normal pulses.     Heart sounds: Normal heart sounds.  Pulmonary:     Effort: Pulmonary effort is normal. No respiratory distress.     Breath sounds: Normal breath sounds. No stridor. No wheezing, rhonchi or rales.  Abdominal:     General: Abdomen is flat. Bowel sounds are normal. There is no distension.     Palpations: Abdomen is soft. There is no mass.     Tenderness: There is no abdominal tenderness. There is no guarding or rebound.     Hernia: No hernia is present.  Lymphadenopathy:     Cervical: No cervical  adenopathy.  Neurological:     General: No focal deficit present.     Mental Status: He is alert and oriented to person, place, and time.           Assessment & Plan:  He seems to have 2 different problems. First he has a viral illness that he shares with his wife. He tests negative for influenza. This should resolve on its own with rest and fluids. Use Tylenol prn. He also has a UTI, and we will treat this with Cipro for 10 days. Culture the sample. At the same time he will finish out of the last 3 days of Doxycycline.  Alysia Penna, MD

## 2018-09-28 ENCOUNTER — Other Ambulatory Visit: Payer: Self-pay | Admitting: Adult Health

## 2018-09-28 LAB — URINE CULTURE
MICRO NUMBER:: 173337
SPECIMEN QUALITY:: ADEQUATE

## 2018-09-29 ENCOUNTER — Encounter: Payer: Self-pay | Admitting: Adult Health

## 2018-09-29 NOTE — Telephone Encounter (Signed)
Verapamil sent to the pharmacy.  Sertraline denied.  Pt no longer takes.

## 2018-09-30 ENCOUNTER — Telehealth: Payer: Self-pay | Admitting: Adult Health

## 2018-09-30 ENCOUNTER — Encounter: Payer: Self-pay | Admitting: Adult Health

## 2018-09-30 DIAGNOSIS — F17290 Nicotine dependence, other tobacco product, uncomplicated: Secondary | ICD-10-CM | POA: Diagnosis not present

## 2018-09-30 DIAGNOSIS — I890 Lymphedema, not elsewhere classified: Secondary | ICD-10-CM | POA: Diagnosis not present

## 2018-09-30 DIAGNOSIS — G4730 Sleep apnea, unspecified: Secondary | ICD-10-CM | POA: Diagnosis not present

## 2018-09-30 DIAGNOSIS — L97322 Non-pressure chronic ulcer of left ankle with fat layer exposed: Secondary | ICD-10-CM | POA: Diagnosis not present

## 2018-09-30 DIAGNOSIS — I5032 Chronic diastolic (congestive) heart failure: Secondary | ICD-10-CM | POA: Diagnosis not present

## 2018-09-30 DIAGNOSIS — I872 Venous insufficiency (chronic) (peripheral): Secondary | ICD-10-CM | POA: Diagnosis not present

## 2018-09-30 DIAGNOSIS — I110 Hypertensive heart disease with heart failure: Secondary | ICD-10-CM | POA: Diagnosis not present

## 2018-09-30 NOTE — Telephone Encounter (Signed)
Copied from [place] [phone]. Topic: Quick Communication - See Telephone Encounter >> Sep 30, 2018  4:15 PM Blase Mess A wrote: CRM for notification. See Telephone encounter for: 09/30/18.  Vivien Rota calling from the [facility] is calling to have the last ov notes-10/06/18 wound care done that day. Can these be faxed. [phone]- Fax [phone]

## 2018-10-05 ENCOUNTER — Encounter: Payer: Self-pay | Admitting: Family Medicine

## 2018-10-05 ENCOUNTER — Other Ambulatory Visit: Payer: Self-pay | Admitting: Family Medicine

## 2018-10-05 MED ORDER — VERAPAMIL HCL ER 240 MG PO TBCR: EXTENDED_RELEASE_TABLET | ORAL | 3 refills | Status: DC

## 2018-10-07 DIAGNOSIS — L97322 Non-pressure chronic ulcer of left ankle with fat layer exposed: Secondary | ICD-10-CM | POA: Diagnosis not present

## 2018-10-07 DIAGNOSIS — G4730 Sleep apnea, unspecified: Secondary | ICD-10-CM | POA: Diagnosis not present

## 2018-10-07 DIAGNOSIS — I890 Lymphedema, not elsewhere classified: Secondary | ICD-10-CM | POA: Diagnosis not present

## 2018-10-07 DIAGNOSIS — I872 Venous insufficiency (chronic) (peripheral): Secondary | ICD-10-CM | POA: Diagnosis not present

## 2018-10-07 DIAGNOSIS — I5032 Chronic diastolic (congestive) heart failure: Secondary | ICD-10-CM | POA: Diagnosis not present

## 2018-10-07 DIAGNOSIS — F17290 Nicotine dependence, other tobacco product, uncomplicated: Secondary | ICD-10-CM | POA: Diagnosis not present

## 2018-10-07 DIAGNOSIS — I110 Hypertensive heart disease with heart failure: Secondary | ICD-10-CM | POA: Diagnosis not present

## 2018-10-11 NOTE — Telephone Encounter (Signed)
Wound Center is affiliated with [facility].  Rep states that they do have access to Epic and will see if able to pull off what they need. If not then we will call back with this request.   Nothing further needed.

## 2018-10-12 DIAGNOSIS — G4733 Obstructive sleep apnea (adult) (pediatric): Secondary | ICD-10-CM | POA: Diagnosis not present

## 2018-10-14 ENCOUNTER — Telehealth: Payer: Self-pay | Admitting: Adult Health

## 2018-10-14 DIAGNOSIS — G4730 Sleep apnea, unspecified: Secondary | ICD-10-CM | POA: Diagnosis not present

## 2018-10-14 DIAGNOSIS — I110 Hypertensive heart disease with heart failure: Secondary | ICD-10-CM | POA: Diagnosis not present

## 2018-10-14 DIAGNOSIS — I5032 Chronic diastolic (congestive) heart failure: Secondary | ICD-10-CM | POA: Diagnosis not present

## 2018-10-14 DIAGNOSIS — I890 Lymphedema, not elsewhere classified: Secondary | ICD-10-CM | POA: Diagnosis not present

## 2018-10-14 DIAGNOSIS — I872 Venous insufficiency (chronic) (peripheral): Secondary | ICD-10-CM | POA: Diagnosis not present

## 2018-10-14 DIAGNOSIS — L97322 Non-pressure chronic ulcer of left ankle with fat layer exposed: Secondary | ICD-10-CM | POA: Diagnosis not present

## 2018-10-14 DIAGNOSIS — F17290 Nicotine dependence, other tobacco product, uncomplicated: Secondary | ICD-10-CM | POA: Diagnosis not present

## 2018-10-14 NOTE — Telephone Encounter (Signed)
Xanax refill request  Pt of [facility]

## 2018-10-14 NOTE — Telephone Encounter (Signed)
Copied from [place] [phone]. Topic: Quick Communication - Rx Refill/Question >> Oct 14, 2018  2:00 PM Chad Lyons, NT wrote: Medication: ALPRAZolam Duanne Moron) 0.5 MG tablet   Has the patient contacted their pharmacy? Yes.   (Agent: If no, request that the patient contact the pharmacy for the refill.) (Agent: If yes, when and what did the pharmacy advise?)  Preferred Pharmacy (with phone number or street name): [facility] [place] [phone] (Phone) [phone] (Fax)    Agent: Please be advised that RX refills may take up to 3 business days. We ask that you follow-up with your pharmacy.

## 2018-10-15 MED ORDER — ALPRAZOLAM 0.5 MG PO TABS: 0.5000 mg | ORAL_TABLET | Freq: Every day | ORAL | 2 refills | Status: DC | PRN

## 2018-10-22 ENCOUNTER — Encounter (HOSPITAL_BASED_OUTPATIENT_CLINIC_OR_DEPARTMENT_OTHER): Payer: BLUE CROSS/BLUE SHIELD | Attending: Internal Medicine

## 2018-10-22 DIAGNOSIS — L97322 Non-pressure chronic ulcer of left ankle with fat layer exposed: Secondary | ICD-10-CM | POA: Insufficient documentation

## 2018-10-22 DIAGNOSIS — I110 Hypertensive heart disease with heart failure: Secondary | ICD-10-CM | POA: Diagnosis not present

## 2018-10-22 DIAGNOSIS — I5030 Unspecified diastolic (congestive) heart failure: Secondary | ICD-10-CM | POA: Insufficient documentation

## 2018-10-22 DIAGNOSIS — G4730 Sleep apnea, unspecified: Secondary | ICD-10-CM | POA: Insufficient documentation

## 2018-10-22 DIAGNOSIS — I872 Venous insufficiency (chronic) (peripheral): Secondary | ICD-10-CM | POA: Diagnosis not present

## 2018-10-25 DIAGNOSIS — I5030 Unspecified diastolic (congestive) heart failure: Secondary | ICD-10-CM | POA: Diagnosis not present

## 2018-10-25 DIAGNOSIS — I110 Hypertensive heart disease with heart failure: Secondary | ICD-10-CM | POA: Diagnosis not present

## 2018-10-25 DIAGNOSIS — L97322 Non-pressure chronic ulcer of left ankle with fat layer exposed: Secondary | ICD-10-CM | POA: Diagnosis not present

## 2018-10-25 DIAGNOSIS — G4730 Sleep apnea, unspecified: Secondary | ICD-10-CM | POA: Diagnosis not present

## 2018-10-28 DIAGNOSIS — G4730 Sleep apnea, unspecified: Secondary | ICD-10-CM | POA: Diagnosis not present

## 2018-10-28 DIAGNOSIS — I872 Venous insufficiency (chronic) (peripheral): Secondary | ICD-10-CM | POA: Diagnosis not present

## 2018-10-28 DIAGNOSIS — I5030 Unspecified diastolic (congestive) heart failure: Secondary | ICD-10-CM | POA: Diagnosis not present

## 2018-10-28 DIAGNOSIS — I110 Hypertensive heart disease with heart failure: Secondary | ICD-10-CM | POA: Diagnosis not present

## 2018-10-28 DIAGNOSIS — L97322 Non-pressure chronic ulcer of left ankle with fat layer exposed: Secondary | ICD-10-CM | POA: Diagnosis not present

## 2018-11-04 DIAGNOSIS — G4730 Sleep apnea, unspecified: Secondary | ICD-10-CM | POA: Diagnosis not present

## 2018-11-04 DIAGNOSIS — I5030 Unspecified diastolic (congestive) heart failure: Secondary | ICD-10-CM | POA: Diagnosis not present

## 2018-11-04 DIAGNOSIS — I110 Hypertensive heart disease with heart failure: Secondary | ICD-10-CM | POA: Diagnosis not present

## 2018-11-04 DIAGNOSIS — L97322 Non-pressure chronic ulcer of left ankle with fat layer exposed: Secondary | ICD-10-CM | POA: Diagnosis not present

## 2018-11-11 DIAGNOSIS — I110 Hypertensive heart disease with heart failure: Secondary | ICD-10-CM | POA: Diagnosis not present

## 2018-11-11 DIAGNOSIS — I5030 Unspecified diastolic (congestive) heart failure: Secondary | ICD-10-CM | POA: Diagnosis not present

## 2018-11-11 DIAGNOSIS — G4730 Sleep apnea, unspecified: Secondary | ICD-10-CM | POA: Diagnosis not present

## 2018-11-11 DIAGNOSIS — I872 Venous insufficiency (chronic) (peripheral): Secondary | ICD-10-CM | POA: Diagnosis not present

## 2018-11-11 DIAGNOSIS — L97322 Non-pressure chronic ulcer of left ankle with fat layer exposed: Secondary | ICD-10-CM | POA: Diagnosis not present

## 2018-11-18 ENCOUNTER — Encounter (HOSPITAL_BASED_OUTPATIENT_CLINIC_OR_DEPARTMENT_OTHER): Payer: BLUE CROSS/BLUE SHIELD | Attending: Internal Medicine

## 2018-11-18 DIAGNOSIS — I890 Lymphedema, not elsewhere classified: Secondary | ICD-10-CM | POA: Insufficient documentation

## 2018-11-18 DIAGNOSIS — G4730 Sleep apnea, unspecified: Secondary | ICD-10-CM | POA: Diagnosis not present

## 2018-11-18 DIAGNOSIS — I10 Essential (primary) hypertension: Secondary | ICD-10-CM | POA: Insufficient documentation

## 2018-11-18 DIAGNOSIS — I872 Venous insufficiency (chronic) (peripheral): Secondary | ICD-10-CM | POA: Diagnosis not present

## 2018-11-18 DIAGNOSIS — L97322 Non-pressure chronic ulcer of left ankle with fat layer exposed: Secondary | ICD-10-CM | POA: Diagnosis not present

## 2018-11-25 DIAGNOSIS — I890 Lymphedema, not elsewhere classified: Secondary | ICD-10-CM | POA: Diagnosis not present

## 2018-11-25 DIAGNOSIS — L97322 Non-pressure chronic ulcer of left ankle with fat layer exposed: Secondary | ICD-10-CM | POA: Diagnosis not present

## 2018-11-25 DIAGNOSIS — I10 Essential (primary) hypertension: Secondary | ICD-10-CM | POA: Diagnosis not present

## 2018-11-25 DIAGNOSIS — G4730 Sleep apnea, unspecified: Secondary | ICD-10-CM | POA: Diagnosis not present

## 2018-12-02 DIAGNOSIS — G4730 Sleep apnea, unspecified: Secondary | ICD-10-CM | POA: Diagnosis not present

## 2018-12-02 DIAGNOSIS — L97322 Non-pressure chronic ulcer of left ankle with fat layer exposed: Secondary | ICD-10-CM | POA: Diagnosis not present

## 2018-12-02 DIAGNOSIS — I890 Lymphedema, not elsewhere classified: Secondary | ICD-10-CM | POA: Diagnosis not present

## 2018-12-02 DIAGNOSIS — S91002A Unspecified open wound, left ankle, initial encounter: Secondary | ICD-10-CM | POA: Diagnosis not present

## 2018-12-02 DIAGNOSIS — I10 Essential (primary) hypertension: Secondary | ICD-10-CM | POA: Diagnosis not present

## 2019-01-11 ENCOUNTER — Encounter: Payer: Self-pay | Admitting: Adult Health

## 2019-01-12 ENCOUNTER — Encounter: Payer: Self-pay | Admitting: Adult Health

## 2019-01-17 ENCOUNTER — Telehealth: Payer: Self-pay | Admitting: *Deleted

## 2019-01-17 NOTE — Telephone Encounter (Signed)
Questions for Screening COVID-19  Symptom onset: Rash (poison Ivy) since last Saturday   Travel or Contacts: No   During this illness, did/does the patient experience any of the following symptoms? Fever >100.67F []   Yes [x]   No []   Unknown Subjective fever (felt feverish) []   Yes [x]   No []   Unknown Chills []   Yes [x]   No []   Unknown Muscle aches (myalgia) []   Yes [x]   No []   Unknown Runny nose (rhinorrhea) []   Yes [x]   No []   Unknown Sore throat []   Yes [x]   No []   Unknown Cough (new onset or worsening of chronic cough) []   Yes [x]   No []   Unknown Shortness of breath (dyspnea) []   Yes [x]   No []   Unknown Nausea or vomiting []   Yes [x]   No []   Unknown Headache []   Yes [x]   No []   Unknown Abdominal pain  []   Yes [x]   No []   Unknown Diarrhea (?3 loose/looser than normal stools/24hr period) []   Yes [x]   No []   Unknown Other, specify:  Patient risk factors: Smoker? [x]   Current []   Former []   Never  If male, currently pregnant? []   Yes []   No  Patient Active Problem List   Diagnosis Date Noted  . Hypophosphatemia 02/24/2017  . Pulmonary HTN ([place]) 02/23/2017  . Mediastinal adenopathy 02/23/2017  . Hypokalemia 02/23/2017  . Acute diastolic CHF (congestive heart failure) ([place]) 02/23/2017  . LVH (left ventricular hypertrophy)   . Elevated troponin 02/22/2017  . Abnormal LFTs 02/22/2017  . Acute CHF (congestive heart failure) ([place]) 02/22/2017  . CHF (congestive heart failure) ([place]) 02/21/2017  . OSA (obstructive sleep apnea) 11/27/2016  . Polyp of ascending colon   . Polyp of rectum   . Ventral incisional hernia 02/20/2015  . Fitting and adjustment of gastric lap band 12/11/2011  . ABDOMINAL MASS, RIGHT LOWER QUADRANT 03/17/2007  . OBESITY, MORBID 01/22/2007  . Essential hypertension 01/22/2007  . Hypertrophic obstructive cardiomyopathy ([place]) 01/22/2007  . DIVERTICULITIS, HX OF 01/22/2007    Plan:  []     Note: Referral to telemedicine is an appropriate alternative  disposition for higher risk but stable. [facility]/e-Visit: [phone].

## 2019-01-18 ENCOUNTER — Other Ambulatory Visit: Payer: Self-pay

## 2019-01-18 ENCOUNTER — Ambulatory Visit (INDEPENDENT_AMBULATORY_CARE_PROVIDER_SITE_OTHER): Payer: BC Managed Care – PPO | Admitting: Adult Health

## 2019-01-18 ENCOUNTER — Encounter: Payer: Self-pay | Admitting: Adult Health

## 2019-01-18 VITALS — BP 124/84 | Temp 98.8°F | Wt 325.0 lb

## 2019-01-18 DIAGNOSIS — N492 Inflammatory disorders of scrotum: Secondary | ICD-10-CM

## 2019-01-18 DIAGNOSIS — L247 Irritant contact dermatitis due to plants, except food: Secondary | ICD-10-CM | POA: Diagnosis not present

## 2019-01-18 MED ORDER — PREDNISONE 10 MG PO TABS: ORAL_TABLET | ORAL | 0 refills | Status: DC

## 2019-01-18 MED ORDER — DOXYCYCLINE HYCLATE 100 MG PO CAPS: 100.0000 mg | ORAL_CAPSULE | Freq: Two times a day (BID) | ORAL | 0 refills | Status: DC

## 2019-01-18 NOTE — Progress Notes (Signed)
Subjective:    Patient ID: Chad Lyons, male    DOB: 1965/08/25, 53 y.o.   MRN: [id number]  HPI 53 year old male who  has a past medical history of Diverticulitis, EKG, abnormal, GERD (gastroesophageal reflux disease), Heart murmur, Hypertension, OSA (obstructive sleep apnea) (11/27/2016), and Ventral incisional hernia.  Presents for two concerns.   1. Was working in the yard one week ago came in contact with poison ivy. He reports intense itching with a red raised rash on bilateral arms, under his eyes, and in his groin. Has been using OTC cortisone cream and calamine lotion which helps short term.   2. Reports a painful mass on his scrotum. Has been present for about a week. Denies any drainage.    Review of Systems See HPI   Past Medical History:  Diagnosis Date  . Diverticulitis    HX OF  . EKG, abnormal    HX OF LEFT ANTERIO FASCICULAR BLOCK ON 02-15-15 EKG  . GERD (gastroesophageal reflux disease)   . Heart murmur    NOW RESOLVED  . Hypertension   . OSA (obstructive sleep apnea) 11/27/2016  . Ventral incisional hernia     Social History   Socioeconomic History  . Marital status: Married    Spouse name: Not on file  . Number of children: Not on file  . Years of education: Not on file  . Highest education level: Not on file  Occupational History  . Occupation: Press photographer  Social Needs  . Financial resource strain: Not on file  . Food insecurity:    Worry: Not on file    Inability: Not on file  . Transportation needs:    Medical: Not on file    Non-medical: Not on file  Tobacco Use  . Smoking status: Current Every Day Smoker    Types: Cigars    Last attempt to quit: 10/11/2004    Years since quitting: 14.2  . Smokeless tobacco: Never Used  . Tobacco comment: Current Cigars 1 every other day -----QUIT cigarettes in 2006>> 1/2PPD x 15 years  Substance and Sexual Activity  . Alcohol use: Yes    Alcohol/week: 3.0 - 4.0 standard drinks    Types: 3 - 4 Shots of  liquor per week    Comment: socially;  . Drug use: No  . Sexual activity: Not on file    Comment: still smokes cigars  Lifestyle  . Physical activity:    Days per week: Not on file    Minutes per session: Not on file  . Stress: Not on file  Relationships  . Social connections:    Talks on phone: Not on file    Gets together: Not on file    Attends religious service: Not on file    Active member of club or organization: Not on file    Attends meetings of clubs or organizations: Not on file    Relationship status: Not on file  . Intimate partner violence:    Fear of current or ex partner: Not on file    Emotionally abused: Not on file    Physically abused: Not on file    Forced sexual activity: Not on file  Other Topics Concern  . Not on file  Social History Narrative  . Not on file    Past Surgical History:  Procedure Laterality Date  . COLON SURGERY  2006   colon resection for polyps  . COLONOSCOPY WITH PROPOFOL N/A 10/31/2016   Procedure: COLONOSCOPY  WITH PROPOFOL;  Surgeon: Mauri Pole, MD;  [facility];  Service: Endoscopy;  Laterality: N/A;  . HIATAL HERNIA REPAIR  1991  . INSERTION OF MESH N/A 02/20/2015   Procedure: INSERTION OF MESH;  Surgeon: Excell Seltzer, MD;  Location: [facility];  Service: General;  Laterality: N/A;  . LAPAROSCOPIC GASTRIC BANDING  2011  . VENTRAL HERNIA REPAIR N/A 02/20/2015   Procedure: OPEN REPAIR VENTRAL INCISIONAL HERNIA;  Surgeon: Excell Seltzer, MD;  Location: [facility];  Service: General;  Laterality: N/A;  . WISDOM TOOTH EXTRACTION      Family History  Problem Relation Age of Onset  . Heart disease Mother   . High blood pressure Mother   . Diabetes Mother   . Heart disease Maternal Aunt   . Cancer Maternal Uncle        pancreatic  . Heart disease Maternal Uncle   . Rheum arthritis Maternal Grandmother   . High blood pressure Maternal Grandmother   . Colon cancer Neg Hx     Allergies  Allergen Reactions  .  Hydromet [Hydrocodone-Homatropine] Itching  . Iodine-131 Itching  . Iohexol Hives and Itching    PT DEVELOPS ITCHING AND HIVES SEVERAL HOURS AFTER INJECTION OF OMNIPAQUE   . Penicillins Itching    Has patient had a PCN reaction causing immediate rash, facial/tongue/throat swelling, SOB or lightheadedness with hypotension: Yes Has patient had a PCN reaction causing severe rash involving mucus membranes or skin necrosis: No Has patient had a PCN reaction that required hospitalization: No Has patient had a PCN reaction occurring within the last 10 years: No If all of the above answers are "NO", then may proceed with Cephalosporin use.   . Red Dye Itching    Current Outpatient Medications on File Prior to Visit  Medication Sig Dispense Refill  . ALPRAZolam (XANAX) 0.5 MG tablet Take 1 tablet (0.5 mg total) by mouth daily as needed. 30 tablet 2  . fluticasone (FLONASE) 50 MCG/ACT nasal spray Place 2 sprays into both nostrils daily. 16 g 6  . furosemide (LASIX) 40 MG tablet TAKE 1 TABLET TWICE A DAY 180 tablet 3  . ibuprofen (ADVIL,MOTRIN) 800 MG tablet Take 1 tablet (800 mg total) by mouth 3 (three) times daily. 21 tablet 0  . losartan (COZAAR) 100 MG tablet Take 100 mg by mouth daily.    . phosphorus (K PHOS NEUTRAL) 155-852-130 MG tablet Take 2 tablets (500 mg total) by mouth 3 (three) times daily. 3 tablet 0  . potassium chloride SA (K-DUR,KLOR-CON) 20 MEQ tablet TAKE 1 TABLET DAILY 90 tablet 3  . thiamine (VITAMIN B-1) 100 MG tablet Take 1 tablet (100 mg total) by mouth daily. 90 tablet 3  . triamcinolone cream (KENALOG) 0.1 % APPLY TOPICALLY TWICE A DAY AS DIRECTED. NEED OFFICE VISIT FOR FURTHER REFILLS. 30 g 24  . verapamil (CALAN-SR) 240 MG CR tablet Take 1 tablet by mouth once daily 90 tablet 3   No current facility-administered medications on file prior to visit.     BP 124/84   Temp 98.8 F (37.1 C)   Wt (!) 325 lb (147.4 kg)   BMI 41.17 kg/m       Objective:   Physical  Exam Vitals signs and nursing note reviewed.  Constitutional:      Appearance: Normal appearance.  Cardiovascular:     Rate and Rhythm: Normal rate and regular rhythm.     Pulses: Normal pulses.     Heart sounds: Normal heart sounds.  Pulmonary:     Effort: Pulmonary effort is normal.     Breath sounds: Normal breath sounds.  Skin:    General: Skin is warm and dry.     Capillary Refill: Capillary refill takes less than 2 seconds.     Findings: Rash present.     Comments: He has a red raised vesicular rash on bilateral arms, under lateral eyes as well as groin.  Still has a nonfluctuant abscess on right side of scrotum  Neurological:     General: No focal deficit present.     Mental Status: He is alert and oriented to person, place, and time.  Psychiatric:        Mood and Affect: Mood normal.        Behavior: Behavior normal.        Thought Content: Thought content normal.        Judgment: Judgment normal.        Assessment & Plan:  1. Irritant contact dermatitis due to plants, except food  - predniSONE (DELTASONE) 10 MG tablet; Take two tabs for 7 days and then 1 tab for 7 days  Dispense: 21 tablet; Refill: 0 - Follow up if no improvement in the next 2-3 days   2. Abscess of scrotum - Non fluctuant, unable to I&D at this time  - doxycycline (VIBRAMYCIN) 100 MG capsule; Take 1 capsule (100 mg total) by mouth 2 (two) times daily.  Dispense: 20 capsule; Refill: 0 - Advised to follow up if abscess comes to a head and starts draining or if symptoms do not resolve   [name], NP

## 2019-02-02 ENCOUNTER — Emergency Department (HOSPITAL_BASED_OUTPATIENT_CLINIC_OR_DEPARTMENT_OTHER)
Admission: EM | Admit: 2019-02-02 | Discharge: 2019-02-02 | Disposition: A | Discharge status: Home / Self Care | Payer: BC Managed Care – PPO | Attending: Emergency Medicine | Admitting: Emergency Medicine

## 2019-02-02 ENCOUNTER — Encounter (HOSPITAL_BASED_OUTPATIENT_CLINIC_OR_DEPARTMENT_OTHER): Payer: Self-pay | Admitting: Emergency Medicine

## 2019-02-02 ENCOUNTER — Other Ambulatory Visit: Payer: Self-pay

## 2019-02-02 DIAGNOSIS — F17290 Nicotine dependence, other tobacco product, uncomplicated: Secondary | ICD-10-CM | POA: Insufficient documentation

## 2019-02-02 DIAGNOSIS — Z79899 Other long term (current) drug therapy: Secondary | ICD-10-CM | POA: Insufficient documentation

## 2019-02-02 DIAGNOSIS — W274XXA Contact with kitchen utensil, initial encounter: Secondary | ICD-10-CM | POA: Diagnosis not present

## 2019-02-02 DIAGNOSIS — Y93G1 Activity, food preparation and clean up: Secondary | ICD-10-CM | POA: Diagnosis not present

## 2019-02-02 DIAGNOSIS — I110 Hypertensive heart disease with heart failure: Secondary | ICD-10-CM | POA: Insufficient documentation

## 2019-02-02 DIAGNOSIS — Y998 Other external cause status: Secondary | ICD-10-CM | POA: Diagnosis not present

## 2019-02-02 DIAGNOSIS — I5030 Unspecified diastolic (congestive) heart failure: Secondary | ICD-10-CM | POA: Insufficient documentation

## 2019-02-02 DIAGNOSIS — S61011A Laceration without foreign body of right thumb without damage to nail, initial encounter: Secondary | ICD-10-CM | POA: Diagnosis not present

## 2019-02-02 DIAGNOSIS — Y92000 Kitchen of unspecified non-institutional (private) residence as  the place of occurrence of the external cause: Secondary | ICD-10-CM | POA: Diagnosis not present

## 2019-02-02 DIAGNOSIS — S61111A Laceration without foreign body of right thumb with damage to nail, initial encounter: Secondary | ICD-10-CM | POA: Diagnosis not present

## 2019-02-02 MED ORDER — LIDOCAINE-EPINEPHRINE (PF) 2 %-1:200000 IJ SOLN
INTRAMUSCULAR | Status: AC
  Filled 2019-02-02: qty 10

## 2019-02-02 MED ORDER — LIDOCAINE-EPINEPHRINE (PF) 2 %-1:200000 IJ SOLN
20.0000 mL | Freq: Once | INTRAMUSCULAR | Status: DC
  Filled 2019-02-02: qty 20

## 2019-02-02 NOTE — ED Triage Notes (Signed)
Sliced side of thumb skin off last night on kitchen slicer.  Still bleeding this morning.  No blood thinners.

## 2019-02-02 NOTE — ED Notes (Signed)
ED Provider at bedside. 

## 2019-02-02 NOTE — ED Provider Notes (Signed)
[facility] EMERGENCY DEPARTMENT Provider Note   CSN: [id number] Arrival date & time: 02/02/19  0719     History   Chief Complaint Chief Complaint  Patient presents with  . Finger Injury    HPI Chad Lyons is a 53 y.o. male.     HPI Patient presents the emergency department with complaints of an avulsion laceration to his right thumb on the radial side after a mandolin injury last night while cutting onions.  He is continued to have bleeding throughout the night.  He has been unable to control this.  He presents applying pressure with a rag to his right thumb.  No other complaints.  Mild pain   Past Medical History:  Diagnosis Date  . Diverticulitis    HX OF  . EKG, abnormal    HX OF LEFT ANTERIO FASCICULAR BLOCK ON 02-15-15 EKG  . GERD (gastroesophageal reflux disease)   . Heart murmur    NOW RESOLVED  . Hypertension   . OSA (obstructive sleep apnea) 11/27/2016  . Ventral incisional hernia     Patient Active Problem List   Diagnosis Date Noted  . Hypophosphatemia 02/24/2017  . Pulmonary HTN ([place]) 02/23/2017  . Mediastinal adenopathy 02/23/2017  . Hypokalemia 02/23/2017  . Acute diastolic CHF (congestive heart failure) ([place]) 02/23/2017  . LVH (left ventricular hypertrophy)   . Elevated troponin 02/22/2017  . Abnormal LFTs 02/22/2017  . Acute CHF (congestive heart failure) ([place]) 02/22/2017  . CHF (congestive heart failure) ([place]) 02/21/2017  . OSA (obstructive sleep apnea) 11/27/2016  . Polyp of ascending colon   . Polyp of rectum   . Ventral incisional hernia 02/20/2015  . Fitting and adjustment of gastric lap band 12/11/2011  . ABDOMINAL MASS, RIGHT LOWER QUADRANT 03/17/2007  . OBESITY, MORBID 01/22/2007  . Essential hypertension 01/22/2007  . Hypertrophic obstructive cardiomyopathy ([place]) 01/22/2007  . DIVERTICULITIS, HX OF 01/22/2007    Past Surgical History:  Procedure Laterality Date  . COLON SURGERY  2006   colon resection for polyps   . COLONOSCOPY WITH PROPOFOL N/A 10/31/2016   Procedure: COLONOSCOPY WITH PROPOFOL;  Surgeon: Mauri Pole, MD;  Location: [facility];  Service: Endoscopy;  Laterality: N/A;  . HIATAL HERNIA REPAIR  1991  . INSERTION OF MESH N/A 02/20/2015   Procedure: INSERTION OF MESH;  Surgeon: Excell Seltzer, MD;  Location: [facility];  Service: General;  Laterality: N/A;  . LAPAROSCOPIC GASTRIC BANDING  2011  . VENTRAL HERNIA REPAIR N/A 02/20/2015   Procedure: OPEN REPAIR VENTRAL INCISIONAL HERNIA;  Surgeon: Excell Seltzer, MD;  Location: [facility];  Service: General;  Laterality: N/A;  . WISDOM TOOTH EXTRACTION          Home Medications    Prior to Admission medications   Medication Sig Start Date End Date Taking? Authorizing Provider  ALPRAZolam Duanne Moron) 0.5 MG tablet Take 1 tablet (0.5 mg total) by mouth daily as needed. 10/15/18   Nafziger, Tommi Rumps, NP  doxycycline (VIBRAMYCIN) 100 MG capsule Take 1 capsule (100 mg total) by mouth 2 (two) times daily. 01/18/19   Nafziger, Tommi Rumps, NP  fluticasone (FLONASE) 50 MCG/ACT nasal spray Place 2 sprays into both nostrils daily. 05/18/18   Nafziger, Tommi Rumps, NP  furosemide (LASIX) 40 MG tablet TAKE 1 TABLET TWICE A DAY 03/08/18   Marletta Lor, MD  ibuprofen (ADVIL,MOTRIN) 800 MG tablet Take 1 tablet (800 mg total) by mouth 3 (three) times daily. 09/20/17   Isla Pence, MD  losartan (COZAAR) 100 MG tablet  Take 100 mg by mouth daily.    [provider]  phosphorus (K PHOS NEUTRAL) [phone] MG tablet Take 2 tablets (500 mg total) by mouth 3 (three) times daily. 02/24/17   Raiford Noble Latif, DO  potassium chloride SA (K-DUR,KLOR-CON) 20 MEQ tablet TAKE 1 TABLET DAILY 03/08/18   Marletta Lor, MD  predniSONE (DELTASONE) 10 MG tablet Take two tabs for 7 days and then 1 tab for 7 days 01/18/19   [name], NP  thiamine (VITAMIN B-1) 100 MG tablet Take 1 tablet (100 mg total) by mouth daily. 09/09/18   Nafziger, Tommi Rumps, NP  triamcinolone cream  (KENALOG) 0.1 % APPLY TOPICALLY TWICE A DAY AS DIRECTED. NEED OFFICE VISIT FOR FURTHER REFILLS. 04/20/18   Marletta Lor, MD  verapamil (CALAN-SR) 240 MG CR tablet Take 1 tablet by mouth once daily 10/05/18   [name], NP    Family History Family History  Problem Relation Age of Onset  . Heart disease Mother   . High blood pressure Mother   . Diabetes Mother   . Heart disease Maternal Aunt   . Cancer Maternal Uncle        pancreatic  . Heart disease Maternal Uncle   . Rheum arthritis Maternal Grandmother   . High blood pressure Maternal Grandmother   . Colon cancer Neg Hx     Social History Social History   Tobacco Use  . Smoking status: Current Every Day Smoker    Types: Cigars    Last attempt to quit: 10/11/2004    Years since quitting: 14.3  . Smokeless tobacco: Never Used  . Tobacco comment: Current Cigars 1 every other day -----QUIT cigarettes in 2006>> 1/2PPD x 15 years  Substance Use Topics  . Alcohol use: Yes    Alcohol/week: 3.0 - 4.0 standard drinks    Types: 3 - 4 Shots of liquor per week    Comment: socially;  . Drug use: No     Allergies   Hydromet [hydrocodone-homatropine], Iodine-131, Iohexol, Penicillins, and Red dye   Review of Systems Review of Systems  All other systems reviewed and are negative.    Physical Exam Updated Vital Signs BP 134/82 (BP Location: Right Arm)   Pulse 73   Temp 98.4 F (36.9 C) (Oral)   Resp 16   Ht 6\' 2"  (1.88 m)   Wt (!) 142.9 kg   SpO2 99%   BMI 40.44 kg/m   Physical Exam Vitals signs and nursing note reviewed.  Constitutional:      Appearance: He is well-developed.  HENT:     Head: Normocephalic.  Neck:     Musculoskeletal: Normal range of motion.  Pulmonary:     Effort: Pulmonary effort is normal.  Abdominal:     General: There is no distension.  Musculoskeletal: Normal range of motion.     Comments: Skin avulsion laceration of right thumb on the radial side with a small amount of active  bleeding.  The majority of this involves the distal phalanx.  Neurological:     Mental Status: He is alert and oriented to person, place, and time.      ED Treatments / Results  Labs (all labs ordered are listed, but only abnormal results are displayed) Labs Reviewed - No data to display  EKG    Radiology No results found.  Procedures .Marland KitchenLaceration Repair Performed by: Jola Schmidt, MD Authorized by: Jola Schmidt, MD   Consent:    Consent obtained:  Verbal  Consent given by:  Patient Anesthesia (see MAR for exact dosages):    Anesthesia method:  None Laceration details:    Location: Right thumb.   Length (cm):  2 Repair type:    Repair type:  Simple Exploration:    Hemostasis achieved with:  Direct pressure and tourniquet Treatment:    Area cleansed with:  Saline   Amount of cleaning:  Standard   Irrigation solution:  Sterile saline Skin repair:    Repair method: Direct pressure.  Wound left open to heal. Post-procedure details:    Dressing:  Sterile dressing   Patient tolerance of procedure:  Tolerated well, no immediate complications   (including critical care time)  Medications Ordered in ED Medications  lidocaine-EPINEPHrine (XYLOCAINE W/EPI) 2 %-1:200000 (PF) injection 20 mL (20 mLs Infiltration Not Given 02/02/19 0741)     Initial Impression / Assessment and Plan / ED Course  I have reviewed the triage vital signs and the nursing notes.  Pertinent labs & imaging results that were available during my care of the patient were reviewed by me and considered in my medical decision making (see chart for details).        With elevation and turnicot application the bleeding was controlled.  After some time a sterile Tegaderm dressing was applied followed by a pressure dressing with Coban.  Elevation continued.  Patient was discharged home in good condition with a sling to keep the arm elevated as much as possible.  Patient instructed on daily local wound  care and dressing application.  Patient understands return to the ER for new or worsening symptoms.  He was recommended to keep his arm/hand elevated as much as possible today  Final Clinical Impressions(s) / ED Diagnoses   Final diagnoses:  Thumb laceration, right, initial encounter    ED Discharge Orders    None       Jola Schmidt, MD 02/02/19 [phone]

## 2019-02-02 NOTE — Discharge Instructions (Addendum)
Change the dressing every morning

## 2019-03-04 ENCOUNTER — Encounter: Payer: Self-pay | Admitting: Adult Health

## 2019-03-04 ENCOUNTER — Other Ambulatory Visit: Payer: Self-pay | Admitting: Family Medicine

## 2019-03-04 MED ORDER — POTASSIUM CHLORIDE CRYS ER 20 MEQ PO TBCR: 20.0000 meq | EXTENDED_RELEASE_TABLET | Freq: Every day | ORAL | 1 refills | Status: DC

## 2019-03-04 MED ORDER — FUROSEMIDE 40 MG PO TABS: 40.0000 mg | ORAL_TABLET | Freq: Two times a day (BID) | ORAL | 1 refills | Status: DC

## 2019-03-16 ENCOUNTER — Encounter: Payer: Self-pay | Admitting: Adult Health

## 2019-03-17 ENCOUNTER — Other Ambulatory Visit: Payer: Self-pay

## 2019-03-17 ENCOUNTER — Telehealth (INDEPENDENT_AMBULATORY_CARE_PROVIDER_SITE_OTHER): Payer: BC Managed Care – PPO | Admitting: Adult Health

## 2019-03-17 DIAGNOSIS — H60332 Swimmer's ear, left ear: Secondary | ICD-10-CM

## 2019-03-17 MED ORDER — CIPROFLOXACIN-DEXAMETHASONE 0.3-0.1 % OT SUSP: OTIC | 0 refills | Status: DC

## 2019-03-17 NOTE — Progress Notes (Signed)
Virtual Visit via Video Note  I connected with Chad Lyons on 03/17/19 at  7:30 AM EDT by a video enabled telemedicine application and verified that I am speaking with the correct person using two identifiers.  Location patient: home Location provider:work or home office Persons participating in the virtual visit: patient, provider  I discussed the limitations of evaluation and management by telemedicine and the availability of in person appointments. The patient expressed understanding and agreed to proceed.   HPI: 53 year old male who is being evaluated today with an acute issue of left ear pain.  Pain has been getting progressively worse over the last 7 to 10 days.  He denies drainage from the ear.  States that his "ear canal feels swollen and raw on the inside".  He does endorse associated pain behind his left ear that radiates down the neck.  Has not noticed any fevers or chills.  He used over-the-counter earache drops last night and this helped him get some sleep.   ROS: See pertinent positives and negatives per HPI.  Past Medical History:  Diagnosis Date  . Diverticulitis    HX OF  . EKG, abnormal    HX OF LEFT ANTERIO FASCICULAR BLOCK ON 02-15-15 EKG  . GERD (gastroesophageal reflux disease)   . Heart murmur    NOW RESOLVED  . Hypertension   . OSA (obstructive sleep apnea) 11/27/2016  . Ventral incisional hernia     Past Surgical History:  Procedure Laterality Date  . COLON SURGERY  2006   colon resection for polyps  . COLONOSCOPY WITH PROPOFOL N/A 10/31/2016   Procedure: COLONOSCOPY WITH PROPOFOL;  Surgeon: Mauri Pole, MD;  Location: [facility];  Service: Endoscopy;  Laterality: N/A;  . HIATAL HERNIA REPAIR  1991  . INSERTION OF MESH N/A 02/20/2015   Procedure: INSERTION OF MESH;  Surgeon: Excell Seltzer, MD;  Location: [facility];  Service: General;  Laterality: N/A;  . LAPAROSCOPIC GASTRIC BANDING  2011  . VENTRAL HERNIA REPAIR N/A 02/20/2015   Procedure: OPEN  REPAIR VENTRAL INCISIONAL HERNIA;  Surgeon: Excell Seltzer, MD;  Location: [facility];  Service: General;  Laterality: N/A;  . WISDOM TOOTH EXTRACTION      Family History  Problem Relation Age of Onset  . Heart disease Mother   . High blood pressure Mother   . Diabetes Mother   . Heart disease Maternal Aunt   . Cancer Maternal Uncle        pancreatic  . Heart disease Maternal Uncle   . Rheum arthritis Maternal Grandmother   . High blood pressure Maternal Grandmother   . Colon cancer Neg Hx       Current Outpatient Medications:  .  ALPRAZolam (XANAX) 0.5 MG tablet, Take 1 tablet (0.5 mg total) by mouth daily as needed., Disp: 30 tablet, Rfl: 2 .  doxycycline (VIBRAMYCIN) 100 MG capsule, Take 1 capsule (100 mg total) by mouth 2 (two) times daily., Disp: 20 capsule, Rfl: 0 .  fluticasone (FLONASE) 50 MCG/ACT nasal spray, Place 2 sprays into both nostrils daily., Disp: 16 g, Rfl: 6 .  furosemide (LASIX) 40 MG tablet, Take 1 tablet (40 mg total) by mouth 2 (two) times daily., Disp: 180 tablet, Rfl: 1 .  ibuprofen (ADVIL,MOTRIN) 800 MG tablet, Take 1 tablet (800 mg total) by mouth 3 (three) times daily., Disp: 21 tablet, Rfl: 0 .  losartan (COZAAR) 100 MG tablet, Take 100 mg by mouth daily., Disp: , Rfl:  .  phosphorus (K PHOS NEUTRAL)  [phone] MG tablet, Take 2 tablets (500 mg total) by mouth 3 (three) times daily., Disp: 3 tablet, Rfl: 0 .  potassium chloride SA (K-DUR) 20 MEQ tablet, Take 1 tablet (20 mEq total) by mouth daily., Disp: 90 tablet, Rfl: 1 .  predniSONE (DELTASONE) 10 MG tablet, Take two tabs for 7 days and then 1 tab for 7 days, Disp: 21 tablet, Rfl: 0 .  thiamine (VITAMIN B-1) 100 MG tablet, Take 1 tablet (100 mg total) by mouth daily., Disp: 90 tablet, Rfl: 3 .  triamcinolone cream (KENALOG) 0.1 %, APPLY TOPICALLY TWICE A DAY AS DIRECTED. NEED OFFICE VISIT FOR FURTHER REFILLS., Disp: 30 g, Rfl: 24 .  verapamil (CALAN-SR) 240 MG CR tablet, Take 1 tablet by mouth once  daily, Disp: 90 tablet, Rfl: 3  EXAM:  VITALS per patient if applicable:  GENERAL: alert, oriented, appears well and in no acute distress  HEENT: atraumatic, conjunttiva clear, no obvious abnormalities on inspection of external nose and ears  NECK: normal movements of the head and neck  LUNGS: on inspection no signs of respiratory distress, breathing rate appears normal, no obvious gross SOB, gasping or wheezing  CV: no obvious cyanosis  MS: moves all visible extremities without noticeable abnormality  PSYCH/NEURO: pleasant and cooperative, no obvious depression or anxiety, speech and thought processing grossly intact  ASSESSMENT AND PLAN:  Discussed the following assessment and plan:  1. Acute swimmer's ear of left side -We will treat as acute otitis externa due to symptoms.  He was advised to follow-up in office if no improvement in the next 2 or 3 days - ciprofloxacin-dexamethasone (CIPRODEX) OTIC suspension; Apply 4 drops to the affected ear(s) twice daily x 7 days  Dispense: 7.5 mL; Refill: 0      I discussed the assessment and treatment plan with the patient. The patient was provided an opportunity to ask questions and all were answered. The patient agreed with the plan and demonstrated an understanding of the instructions.   The patient was advised to call back or seek an in-person evaluation if the symptoms worsen or if the condition fails to improve as anticipated.   [name], NP

## 2019-03-27 ENCOUNTER — Encounter: Payer: Self-pay | Admitting: Adult Health

## 2019-03-29 MED ORDER — VERAPAMIL HCL ER 240 MG PO TBCR: EXTENDED_RELEASE_TABLET | ORAL | 1 refills | Status: DC

## 2019-03-29 MED ORDER — VALSARTAN 80 MG PO TABS: 80.0000 mg | ORAL_TABLET | Freq: Every day | ORAL | 1 refills | Status: DC

## 2019-04-08 ENCOUNTER — Other Ambulatory Visit: Payer: Self-pay | Admitting: Family Medicine

## 2019-04-08 MED ORDER — ALPRAZOLAM 0.5 MG PO TABS: 0.5000 mg | ORAL_TABLET | Freq: Every day | ORAL | 0 refills | Status: DC | PRN

## 2019-05-12 DIAGNOSIS — G4733 Obstructive sleep apnea (adult) (pediatric): Secondary | ICD-10-CM | POA: Diagnosis not present

## 2019-06-05 ENCOUNTER — Encounter: Payer: Self-pay | Admitting: Adult Health

## 2019-06-07 ENCOUNTER — Encounter: Payer: Self-pay | Admitting: Adult Health

## 2019-06-07 ENCOUNTER — Ambulatory Visit (INDEPENDENT_AMBULATORY_CARE_PROVIDER_SITE_OTHER): Payer: BC Managed Care – PPO

## 2019-06-07 ENCOUNTER — Other Ambulatory Visit: Payer: Self-pay

## 2019-06-07 DIAGNOSIS — Z23 Encounter for immunization: Secondary | ICD-10-CM

## 2019-06-07 NOTE — Addendum Note (Signed)
Addended by: Rebecca Eaton on: 06/07/2019 03:10 PM   Modules accepted: Orders

## 2019-06-07 NOTE — Telephone Encounter (Signed)
Please advise. [place] for shingles vaccine?

## 2019-07-03 ENCOUNTER — Other Ambulatory Visit: Payer: Self-pay | Admitting: Adult Health

## 2019-07-03 ENCOUNTER — Emergency Department (HOSPITAL_BASED_OUTPATIENT_CLINIC_OR_DEPARTMENT_OTHER): Payer: BC Managed Care – PPO

## 2019-07-03 ENCOUNTER — Other Ambulatory Visit: Payer: Self-pay

## 2019-07-03 ENCOUNTER — Emergency Department (HOSPITAL_BASED_OUTPATIENT_CLINIC_OR_DEPARTMENT_OTHER)
Admission: EM | Admit: 2019-07-03 | Discharge: 2019-07-03 | Disposition: A | Discharge status: Home / Self Care | Payer: BC Managed Care – PPO | Attending: Emergency Medicine | Admitting: Emergency Medicine

## 2019-07-03 ENCOUNTER — Encounter (HOSPITAL_BASED_OUTPATIENT_CLINIC_OR_DEPARTMENT_OTHER): Payer: Self-pay

## 2019-07-03 DIAGNOSIS — Z79899 Other long term (current) drug therapy: Secondary | ICD-10-CM | POA: Diagnosis not present

## 2019-07-03 DIAGNOSIS — R059 Cough, unspecified: Secondary | ICD-10-CM

## 2019-07-03 DIAGNOSIS — R0602 Shortness of breath: Secondary | ICD-10-CM | POA: Diagnosis not present

## 2019-07-03 DIAGNOSIS — Z20828 Contact with and (suspected) exposure to other viral communicable diseases: Secondary | ICD-10-CM | POA: Insufficient documentation

## 2019-07-03 DIAGNOSIS — I10 Essential (primary) hypertension: Secondary | ICD-10-CM

## 2019-07-03 DIAGNOSIS — F17290 Nicotine dependence, other tobacco product, uncomplicated: Secondary | ICD-10-CM | POA: Diagnosis not present

## 2019-07-03 DIAGNOSIS — I110 Hypertensive heart disease with heart failure: Secondary | ICD-10-CM | POA: Insufficient documentation

## 2019-07-03 DIAGNOSIS — I5033 Acute on chronic diastolic (congestive) heart failure: Secondary | ICD-10-CM | POA: Insufficient documentation

## 2019-07-03 DIAGNOSIS — R05 Cough: Secondary | ICD-10-CM | POA: Insufficient documentation

## 2019-07-03 DIAGNOSIS — R2243 Localized swelling, mass and lump, lower limb, bilateral: Secondary | ICD-10-CM | POA: Diagnosis not present

## 2019-07-03 LAB — CBC
HCT: 44.9 % (ref 39.0–52.0)
Hemoglobin: 14.3 g/dL (ref 13.0–17.0)
MCH: 29.1 pg (ref 26.0–34.0)
MCHC: 31.8 g/dL (ref 30.0–36.0)
MCV: 91.4 fL (ref 80.0–100.0)
Platelets: 260 10*3/uL (ref 150–400)
RBC: 4.91 MIL/uL (ref 4.22–5.81)
RDW: 13.2 % (ref 11.5–15.5)
WBC: 10.4 10*3/uL (ref 4.0–10.5)
nRBC: 0 % (ref 0.0–0.2)

## 2019-07-03 LAB — BASIC METABOLIC PANEL
Anion gap: 10 (ref 5–15)
BUN: 14 mg/dL (ref 6–20)
CO2: 26 mmol/L (ref 22–32)
Calcium: 9.4 mg/dL (ref 8.9–10.3)
Chloride: 102 mmol/L (ref 98–111)
Creatinine, Ser: 0.95 mg/dL (ref 0.61–1.24)
GFR calc Af Amer: >60 mL/min (ref >60)
GFR calc non Af Amer: >60 mL/min (ref >60)
Glucose, Bld: 111 mg/dL — ABNORMAL HIGH (ref 70–99)
Potassium: 3.7 mmol/L (ref 3.5–5.1)
Sodium: 138 mmol/L (ref 135–145)

## 2019-07-03 LAB — BRAIN NATRIURETIC PEPTIDE: B Natriuretic Peptide: 369.2 pg/mL — ABNORMAL HIGH (ref 0.0–100.0)

## 2019-07-03 LAB — SARS CORONAVIRUS 2 (TAT 6-24 HRS): SARS Coronavirus 2: NEGATIVE

## 2019-07-03 MED ORDER — DOXYCYCLINE HYCLATE 100 MG PO CAPS: 100.0000 mg | ORAL_CAPSULE | Freq: Two times a day (BID) | ORAL | 0 refills | Status: DC

## 2019-07-03 MED ORDER — PREDNISONE 20 MG PO TABS: 40.0000 mg | ORAL_TABLET | Freq: Every day | ORAL | 0 refills | Status: DC

## 2019-07-03 NOTE — Discharge Instructions (Addendum)
Please take the following medications exactly as prescribed to help improve faster.   Doxycycline, twice a day for 10 days Prednisone daily for 5 days Increase your Lasix (furosemide) to twice a day for the next week then back off to once a day  We have sent a coronavirus test, we will call you with results only if it is positive, you will find out within the next 48 hours.  Please stay home and quarantine until that time.  If your test is positive you will need to quarantine until your symptoms are completely better  Please see your family doctor within 1 to 2 weeks for a recheck, your blood pressure has been persistently elevated and having better blood pressure control can help your heart function in the long run.  Emergency department for severe or worsening pain, swelling, difficulty breathing, coughing, fever or any other severe or worsening symptoms.

## 2019-07-03 NOTE — ED Provider Notes (Signed)
[facility] EMERGENCY DEPARTMENT Provider Note   CSN: [id number] Arrival date & time: 07/03/19  1057     History   Chief Complaint Chief Complaint  Patient presents with  . Cough    HPI Chad Lyons is a 53 y.o. male.     HPI  This patient is a 53 year old male, he has a known history of left ventricular hypertrophy which was seen on an echocardiogram in July 2018 when he was admitted to the hospital for new onset diastolic congestive heart failure.  Since that time the patient has been on diuretics and doing very well.  He recently over the last 2 weeks has developed increasing shortness of breath with a cough sometimes productive of a clear and whitish phlegm.  He does not have any fevers or chills, he has had some progressive swelling in his bilateral lower extremity which he notes that he had when he was admitted for congestive heart failure.  He has been on his Lasix, he diuresis appropriately and feels like the swelling is started to go down.  There is no chest discomfort, no sore throat or runny nose, no loss of taste or smell.  He has been on some over-the-counter Mucinex to help with the cough which has not done very much.  Past Medical History:  Diagnosis Date  . Diverticulitis    HX OF  . EKG, abnormal    HX OF LEFT ANTERIO FASCICULAR BLOCK ON 02-15-15 EKG  . GERD (gastroesophageal reflux disease)   . Heart murmur    NOW RESOLVED  . Hypertension   . OSA (obstructive sleep apnea) 11/27/2016  . Ventral incisional hernia     Patient Active Problem List   Diagnosis Date Noted  . Hypophosphatemia 02/24/2017  . Pulmonary HTN ([place]) 02/23/2017  . Mediastinal adenopathy 02/23/2017  . Hypokalemia 02/23/2017  . Acute diastolic CHF (congestive heart failure) ([place]) 02/23/2017  . LVH (left ventricular hypertrophy)   . Elevated troponin 02/22/2017  . Abnormal LFTs 02/22/2017  . Acute CHF (congestive heart failure) ([place]) 02/22/2017  . CHF (congestive heart  failure) ([place]) 02/21/2017  . OSA (obstructive sleep apnea) 11/27/2016  . Polyp of ascending colon   . Polyp of rectum   . Ventral incisional hernia 02/20/2015  . Fitting and adjustment of gastric lap band 12/11/2011  . ABDOMINAL MASS, RIGHT LOWER QUADRANT 03/17/2007  . OBESITY, MORBID 01/22/2007  . Essential hypertension 01/22/2007  . 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 DIVERTICULITIS, HX OF 01/22/2007    Past Surgical History:  Procedure Laterality Date  . COLON SURGERY  2006   colon resection for polyps  . COLONOSCOPY WITH PROPOFOL N/A 10/31/2016   Procedure: COLONOSCOPY WITH PROPOFOL;  Surgeon: Mauri Pole, MD;  Location: [facility];  Service: Endoscopy;  Laterality: N/A;  . HIATAL HERNIA REPAIR  1991  . INSERTION OF MESH N/A 02/20/2015   Procedure: INSERTION OF MESH;  Surgeon: Excell Seltzer, MD;  Location: [facility];  Service: General;  Laterality: N/A;  . LAPAROSCOPIC GASTRIC BANDING  2011  . VENTRAL HERNIA REPAIR N/A 02/20/2015   Procedure: OPEN REPAIR VENTRAL INCISIONAL HERNIA;  Surgeon: Excell Seltzer, MD;  Location: [facility];  Service: General;  Laterality: N/A;  . WISDOM TOOTH EXTRACTION          Home Medications    Prior to Admission medications   Medication Sig Start Date End Date Taking? Authorizing Provider  ALPRAZolam Duanne Moron) 0.5 MG tablet Take 1 tablet (0.5 mg total) by mouth  daily as needed. 04/08/19   Nafziger, Tommi Rumps, NP  ciprofloxacin-dexamethasone (CIPRODEX) OTIC suspension Apply 4 drops to the affected ear(s) twice daily x 7 days 03/17/19   [name], NP  doxycycline (VIBRAMYCIN) 100 MG capsule Take 1 capsule (100 mg total) by mouth 2 (two) times daily. 07/03/19   Noemi Chapel, MD  fluticasone (FLONASE) 50 MCG/ACT nasal spray Place 2 sprays into both nostrils daily. 05/18/18   Nafziger, Tommi Rumps, NP  furosemide (LASIX) 40 MG tablet Take 1 tablet (40 mg total) by mouth 2 (two) times daily. 03/04/19   Nafziger, Tommi Rumps, NP  ibuprofen  (ADVIL,MOTRIN) 800 MG tablet Take 1 tablet (800 mg total) by mouth 3 (three) times daily. 09/20/17   Isla Pence, MD  losartan (COZAAR) 100 MG tablet Take 100 mg by mouth daily.    [provider]  phosphorus (K PHOS NEUTRAL) 155-852-130 MG tablet Take 2 tablets (500 mg total) by mouth 3 (three) times daily. 02/24/17   Raiford Noble Latif, DO  potassium chloride SA (K-DUR) 20 MEQ tablet Take 1 tablet (20 mEq total) by mouth daily. 03/04/19   Nafziger, Tommi Rumps, NP  predniSONE (DELTASONE) 20 MG tablet Take 2 tablets (40 mg total) by mouth daily. 07/03/19   Noemi Chapel, MD  thiamine (VITAMIN B-1) 100 MG tablet Take 1 tablet (100 mg total) by mouth daily. 09/09/18   Nafziger, Tommi Rumps, NP  triamcinolone cream (KENALOG) 0.1 % APPLY TOPICALLY TWICE A DAY AS DIRECTED. NEED OFFICE VISIT FOR FURTHER REFILLS. 04/20/18   Marletta Lor, MD  valsartan (DIOVAN) 80 MG tablet Take 1 tablet (80 mg total) by mouth daily. 03/29/19   Nafziger, Tommi Rumps, NP  verapamil (CALAN-SR) 240 MG CR tablet Take 1 tablet by mouth once daily 03/29/19   [name], NP    Family History Family History  Problem Relation Age of Onset  . Heart disease Mother   . High blood pressure Mother   . Diabetes Mother   . Heart disease Maternal Aunt   . Cancer Maternal Uncle        pancreatic  . Heart disease Maternal Uncle   . Rheum arthritis Maternal Grandmother   . High blood pressure Maternal Grandmother   . Colon cancer Neg Hx     Social History Social History   Tobacco Use  . Smoking status: Current Every Day Smoker    Types: Cigars    Last attempt to quit: 10/11/2004    Years since quitting: 14.7  . Smokeless tobacco: Never Used  . Tobacco comment: Current Cigars 1 every other day -----QUIT cigarettes in 2006>> 1/2PPD x 15 years  Substance Use Topics  . Alcohol use: Yes    Alcohol/week: 3.0 - 4.0 standard drinks    Types: 3 - 4 Shots of liquor per week    Comment: socially;  . Drug use: No     Allergies    Hydromet [hydrocodone-homatropine], Iodine-131, Iohexol, Penicillins, and Red dye   Review of Systems Review of Systems  Constitutional: Negative for chills and fever.  HENT: Negative for sore throat.   Eyes: Negative for visual disturbance.  Respiratory: Positive for cough and shortness of breath.   Cardiovascular: Positive for leg swelling. Negative for chest pain.  Gastrointestinal: Negative for abdominal pain, diarrhea, nausea and vomiting.  Genitourinary: Negative for dysuria and frequency.  Musculoskeletal: Negative for back pain and neck pain.  Skin: Negative for rash.  Neurological: Negative for weakness, numbness and headaches.  Hematological: Negative for adenopathy.  Psychiatric/Behavioral: Negative for behavioral problems.  Physical Exam Updated Vital Signs BP (!) 156/90   Pulse (!) 52   Temp 98.4 F (36.9 C) (Oral)   Resp 16   Ht 1.905 m (6\' 3" )   Wt (!) 145.2 kg   SpO2 95%   BMI 40.00 kg/m   Physical Exam Vitals signs and nursing note reviewed.  Constitutional:      General: He is not in acute distress.    Appearance: He is well-developed.  HENT:     Head: Normocephalic and atraumatic.     Mouth/Throat:     Pharynx: No oropharyngeal exudate.  Eyes:     General: No scleral icterus.       Right eye: No discharge.        Left eye: No discharge.     Conjunctiva/sclera: Conjunctivae normal.     Pupils: Pupils are equal, round, and reactive to light.  Neck:     Musculoskeletal: Normal range of motion and neck supple.     Thyroid: No thyromegaly.     Vascular: No JVD.  Cardiovascular:     Rate and Rhythm: Normal rate and regular rhythm.     Heart sounds: Normal heart sounds. No murmur. No friction rub. No gallop.   Pulmonary:     Effort: Pulmonary effort is normal. No respiratory distress.     Breath sounds: Normal breath sounds. No wheezing or rales.     Comments: The patient is able to speak in full sentences, he has no rales or wheezing, no  increased work of breathing. Abdominal:     General: Bowel sounds are normal. There is no distension.     Palpations: Abdomen is soft. There is no mass.     Tenderness: There is no abdominal tenderness.  Musculoskeletal: Normal range of motion.        General: No tenderness.     Right lower leg: Edema present.     Left lower leg: Edema present.     Comments: 1+ bilateral edema in the pretibial and ankle regions bilaterally, symmetrical  Lymphadenopathy:     Cervical: No cervical adenopathy.  Skin:    General: Skin is warm and dry.     Findings: No erythema or rash.  Neurological:     Mental Status: He is alert.     Coordination: Coordination normal.  Psychiatric:        Behavior: Behavior normal.      ED Treatments / Results  Labs (all labs ordered are listed, but only abnormal results are displayed) Labs Reviewed  BRAIN NATRIURETIC PEPTIDE - Abnormal; Notable for the following components:      Result Value   B Natriuretic Peptide 369.2 (*)    All other components within normal limits  BASIC METABOLIC PANEL - Abnormal; Notable for the following components:   Glucose, Bld 111 (*)    All other components within normal limits  CBC    EKG EKG Interpretation  Date/Time:  Sunday July 03 2019 11:29:17 EST Ventricular Rate:  57 PR Interval:    QRS Duration: 144 QT Interval:  480 QTC Calculation: 468 R Axis:   -51 Text Interpretation: Sinus rhythm LVH with IVCD, LAD and secondary repol abnrm since last tracing no significant change Confirmed by Noemi Chapel [phone]) on 07/03/2019 11:33:56 AM   Radiology Dg Chest 2 View  Result Date: 07/03/2019 CLINICAL DATA:  CHF, cough EXAM: CHEST - 2 VIEW COMPARISON:  02/21/2017 FINDINGS: Cardiomegaly. Diffuse bilateral interstitial pulmonary opacity. Disc degenerative disease of the thoracic spine.  IMPRESSION: Diffuse bilateral interstitial pulmonary opacity, consistent with edema or infection in the setting of cardiomegaly.  Electronically Signed   By: Eddie Candle M.D.   On: 07/03/2019 12:13    Procedures Procedures (including critical care time)  Medications Ordered in ED Medications - No data to display   Initial Impression / Assessment and Plan / ED Course  I have reviewed the triage vital signs and the nursing notes.  Pertinent labs & imaging results that were available during my care of the patient were reviewed by me and considered in my medical decision making (see chart for details).  Clinical Course as of Jul 02 1245  Nancy Fetter Jul 03, 2019  1242 The x-ray shows likely edema, however there is some overlap between the edema and potentially having Covid.  We will do a swab, the patient will need to quarantine until the results are back, he is otherwise well-appearing, will increase his Lasix and have him follow-up with his family doctor, the patient is agreeable to the plan.  His other labs are unremarkable including metabolic panel, CBC, his BNP was just under 400.  Patient is agreeable to the plan.   [BM]    Clinical Course User Index [BM] Noemi Chapel, MD       There is no JVD, the patient is otherwise well-appearing with clear lungs and no respiratory distress.  At this time we will check a BNP and a chest x-ray to make sure that the patient is not in decompensated failure.  He may need to go up on his Lasix but otherwise appears well for discharge.  This does not seem to be ischemic, no chest pain, no exertional symptoms.  This could also be infectious, atypical or walking pneumonia is a possibility.  He does not appear in distress.  Vital signs are reassuring except for mild hypertension which is likely the cause of his diastolic LVH related CHF.  I pulled up the patient's old record in the room and went over his history of CHF and ultrasound with him so that he was aware of his pathology.  He expressed understanding  Chest x-ray does reveal what appears to be pulmonary edema, could be Covid but  less likely, test will be sent, labs reviewed with the patient and normal, home on medications as below  Final Clinical Impressions(s) / ED Diagnoses   Final diagnoses:  Acute on chronic diastolic congestive heart failure (HCC)  Essential hypertension  Cough    ED Discharge Orders         Ordered    doxycycline (VIBRAMYCIN) 100 MG capsule  2 times daily     07/03/19 1245    predniSONE (DELTASONE) 20 MG tablet  Daily     07/03/19 1245           Noemi Chapel, MD 07/03/19 1246

## 2019-07-03 NOTE — ED Triage Notes (Signed)
Pt states cough, shortness of breath for 2 weeks, taking mucinex, some improvement.  Denies fevers.

## 2019-07-03 NOTE — ED Notes (Signed)
Per MD pt ok to maintain droplet precautions, not needed for airborne, requests 2 view cxr in radiology department

## 2019-07-03 NOTE — ED Notes (Signed)
Patient transported to X-ray 

## 2019-07-03 NOTE — ED Notes (Signed)
MD at bedside. 

## 2019-07-10 ENCOUNTER — Encounter: Payer: Self-pay | Admitting: Adult Health

## 2019-07-20 ENCOUNTER — Other Ambulatory Visit: Payer: Self-pay | Admitting: Family Medicine

## 2019-07-20 MED ORDER — TRIAMCINOLONE ACETONIDE 0.1 % EX CREA: TOPICAL_CREAM | CUTANEOUS | 3 refills | Status: DC

## 2019-08-04 ENCOUNTER — Telehealth: Payer: Self-pay | Admitting: *Deleted

## 2019-08-04 NOTE — Telephone Encounter (Signed)
Returned call to wife and schedule Mr. Zaretsky for his 2nd shingles shot on 08/15/19 as requested.  Copied from [place] [phone]. Topic: Appointment Scheduling - Scheduling Inquiry for Clinic >> Aug 04, 2019 11:18 AM Rayann Heman wrote: Reason for CRM: pt wife called and stated that she would like to know if pt can schedule 2nd shingles shot for 08/15/19. Please advise

## 2019-08-15 ENCOUNTER — Ambulatory Visit: Payer: BC Managed Care – PPO

## 2019-08-15 NOTE — Progress Notes (Signed)
error 

## 2019-08-16 DIAGNOSIS — G4733 Obstructive sleep apnea (adult) (pediatric): Secondary | ICD-10-CM | POA: Diagnosis not present

## 2019-08-18 ENCOUNTER — Ambulatory Visit: Payer: BC Managed Care – PPO | Attending: Internal Medicine

## 2019-08-30 ENCOUNTER — Other Ambulatory Visit: Payer: Self-pay | Admitting: Adult Health

## 2019-08-30 NOTE — Telephone Encounter (Signed)
Patient need to schedule an ov for more refills. Pt is due for a CPE.

## 2019-08-31 ENCOUNTER — Encounter: Payer: Self-pay | Admitting: Adult Health

## 2019-09-01 ENCOUNTER — Encounter: Payer: Self-pay | Admitting: Adult Health

## 2019-09-01 NOTE — Telephone Encounter (Signed)
Spoke to pt and advised that he will need an appt. Pt verbalized understanding. Pt has been scheduled for CPE.

## 2019-09-07 ENCOUNTER — Other Ambulatory Visit: Payer: Self-pay | Admitting: Adult Health

## 2019-09-14 ENCOUNTER — Other Ambulatory Visit: Payer: Self-pay

## 2019-09-15 ENCOUNTER — Encounter: Payer: Self-pay | Admitting: Adult Health

## 2019-09-15 ENCOUNTER — Ambulatory Visit (INDEPENDENT_AMBULATORY_CARE_PROVIDER_SITE_OTHER): Payer: BC Managed Care – PPO | Admitting: Adult Health

## 2019-09-15 VITALS — BP 134/82 | HR 66 | Temp 98.2°F | Ht 73.0 in | Wt 331.0 lb

## 2019-09-15 DIAGNOSIS — Z0000 Encounter for general adult medical examination without abnormal findings: Secondary | ICD-10-CM | POA: Diagnosis not present

## 2019-09-15 DIAGNOSIS — G4733 Obstructive sleep apnea (adult) (pediatric): Secondary | ICD-10-CM | POA: Diagnosis not present

## 2019-09-15 DIAGNOSIS — Z125 Encounter for screening for malignant neoplasm of prostate: Secondary | ICD-10-CM | POA: Diagnosis not present

## 2019-09-15 DIAGNOSIS — Z9884 Bariatric surgery status: Secondary | ICD-10-CM

## 2019-09-15 DIAGNOSIS — I10 Essential (primary) hypertension: Secondary | ICD-10-CM | POA: Diagnosis not present

## 2019-09-15 DIAGNOSIS — I517 Cardiomegaly: Secondary | ICD-10-CM | POA: Diagnosis not present

## 2019-09-15 DIAGNOSIS — G4700 Insomnia, unspecified: Secondary | ICD-10-CM

## 2019-09-15 DIAGNOSIS — Z23 Encounter for immunization: Secondary | ICD-10-CM | POA: Diagnosis not present

## 2019-09-15 LAB — CBC WITH DIFFERENTIAL/PLATELET
Basophils Absolute: 0 10*3/uL (ref 0.0–0.1)
Basophils Relative: 0.4 % (ref 0.0–3.0)
Eosinophils Absolute: 0.1 10*3/uL (ref 0.0–0.7)
Eosinophils Relative: 1.3 % (ref 0.0–5.0)
HCT: 47.3 % (ref 39.0–52.0)
Hemoglobin: 15.7 g/dL (ref 13.0–17.0)
Lymphocytes Relative: 15.7 % (ref 12.0–46.0)
Lymphs Abs: 1.4 10*3/uL (ref 0.7–4.0)
MCHC: 33.3 g/dL (ref 30.0–36.0)
MCV: 89.1 fl (ref 78.0–100.0)
Monocytes Absolute: 0.8 10*3/uL (ref 0.1–1.0)
Monocytes Relative: 9.5 % (ref 3.0–12.0)
Neutro Abs: 6.4 10*3/uL (ref 1.4–7.7)
Neutrophils Relative %: 73.1 % (ref 43.0–77.0)
Platelets: 191 10*3/uL (ref 150.0–400.0)
RBC: 5.31 Mil/uL (ref 4.22–5.81)
RDW: 14.5 % (ref 11.5–15.5)
WBC: 8.8 10*3/uL (ref 4.0–10.5)

## 2019-09-15 LAB — VITAMIN D 25 HYDROXY (VIT D DEFICIENCY, FRACTURES): VITD: 38.44 ng/mL (ref 30.00–100.00)

## 2019-09-15 LAB — HEMOGLOBIN A1C: Hgb A1c MFr Bld: 5.8 % (ref 4.6–6.5)

## 2019-09-15 LAB — LIPID PANEL
Cholesterol: 139 mg/dL (ref 0–200)
HDL: 52.2 mg/dL (ref >39.00)
LDL Cholesterol: 68 mg/dL (ref 0–99)
NonHDL: 86.84
Total CHOL/HDL Ratio: 3
Triglycerides: 95 mg/dL (ref 0.0–149.0)
VLDL: 19 mg/dL (ref 0.0–40.0)

## 2019-09-15 LAB — COMPREHENSIVE METABOLIC PANEL
ALT: 28 U/L (ref 0–53)
AST: 31 U/L (ref 0–37)
Albumin: 4.5 g/dL (ref 3.5–5.2)
Alkaline Phosphatase: 68 U/L (ref 39–117)
BUN: 14 mg/dL (ref 6–23)
CO2: 29 mEq/L (ref 19–32)
Calcium: 9.7 mg/dL (ref 8.4–10.5)
Chloride: 103 mEq/L (ref 96–112)
Creatinine, Ser: 0.98 mg/dL (ref 0.40–1.50)
GFR: 96.28 mL/min (ref >60.00)
Glucose, Bld: 80 mg/dL (ref 70–99)
Potassium: 3.7 mEq/L (ref 3.5–5.1)
Sodium: 141 mEq/L (ref 135–145)
Total Bilirubin: 0.6 mg/dL (ref 0.2–1.2)
Total Protein: 7.4 g/dL (ref 6.0–8.3)

## 2019-09-15 LAB — IBC + FERRITIN
Ferritin: 50 ng/mL (ref 22.0–322.0)
Iron: 47 ug/dL (ref 42–165)
Saturation Ratios: 10.8 % — ABNORMAL LOW (ref 20.0–50.0)
Transferrin: 311 mg/dL (ref 212.0–360.0)

## 2019-09-15 LAB — FOLATE: Folate: 20.4 ng/mL (ref >5.9)

## 2019-09-15 LAB — PSA: PSA: 0.3 ng/mL (ref 0.10–4.00)

## 2019-09-15 LAB — TSH: TSH: 0.58 u[IU]/mL (ref 0.35–4.50)

## 2019-09-15 MED ORDER — POTASSIUM CHLORIDE CRYS ER 20 MEQ PO TBCR: 20.0000 meq | EXTENDED_RELEASE_TABLET | Freq: Every day | ORAL | 1 refills | Status: DC

## 2019-09-15 MED ORDER — FUROSEMIDE 40 MG PO TABS: 40.0000 mg | ORAL_TABLET | Freq: Two times a day (BID) | ORAL | 1 refills | Status: DC

## 2019-09-15 MED ORDER — ALPRAZOLAM 0.5 MG PO TABS: 0.5000 mg | ORAL_TABLET | Freq: Every day | ORAL | 1 refills | Status: DC | PRN

## 2019-09-15 MED ORDER — VERAPAMIL HCL ER 240 MG PO TBCR: EXTENDED_RELEASE_TABLET | ORAL | 1 refills | Status: DC

## 2019-09-15 NOTE — Progress Notes (Signed)
Subjective:    Patient ID: Chad Lyons, male    DOB: 1965-09-10, 54 y.o.   MRN: [id number]  HPI Patient presents for yearly preventative medicine examination. He is a pleasant 54 year old male who  has a past medical history of Diverticulitis, EKG, abnormal, GERD (gastroesophageal reflux disease), Heart murmur, Hypertension, OSA (obstructive sleep apnea) (11/27/2016), and Ventral incisional hernia.   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  He denies dizziness, lightheadedness, chest pain, or shortness of breath BP Readings from Last 3 Encounters:  09/15/19 134/82  07/03/19 (!) 155/94  02/02/19 134/82   OSA -he uses CPAP nightly and denies daytime somnolence.  He is followed by pulmonary on a yearly basis  Insomnia-takes Xanax 0.5 mg as needed  Chronic Diastolic CHF -now seen by cardiology last was in January 2020.  At that time he was kept on Lasix 40 mg daily and appeared to be well compensated.  Last echo in 2018 showed: Impressions:  - Normal LV systolic function; severe LVH; moderate diastolic dysfunction; sclerotic aortic valve with trace AI; mild MR; severe LAE; mild TR.  Unfortunately he was seen in the emergency room 07/03/2019 for 2 weeks of increased shortness of breath and a cough.  This time he also had progressive swelling in his bilateral lower extremities.  His chest x-ray at this time revealed pulmonary edema.  His labs are normal and he was discharged with doxycycline and prednisone for suspected bronchitis.   Obesity -history of gastric bypass in 2011 and was able to lose 100 pounds.  His heaviest weight was 450 pounds. His weight is up over the last year about 11 pounds over the last year. He is living out of a hotel most of the week and his diet has been pretty poor. He has  started eating healthier at work   [facility] Readings from Last 3 Encounters:  09/15/19 (!) 331 lb (150.1 kg)  07/03/19 (!) 320 lb (145.2 kg)  02/02/19 (!) 315 lb (142.9 kg)   All immunizations and health maintenance protocols were reviewed with the patient and needed orders were placed. He is due for second shingles vaccination   Appropriate screening laboratory values were ordered for the patient including screening of hyperlipidemia, renal function and hepatic function. If indicated by BPH, a PSA was ordered.  Medication reconciliation,  past medical history, social history, problem list and allergies were reviewed in detail with the patient  Goals were established with regard to weight loss, exercise, and  diet in compliance with medications    Review of Systems  Constitutional: Negative.   HENT: Negative.   Eyes: Negative.   Respiratory: Negative.   Cardiovascular: Negative.   Gastrointestinal: Negative.   Endocrine: Negative.   Genitourinary: Negative.   Musculoskeletal: Negative.   Skin: Negative.   Allergic/Immunologic: Negative.   Neurological: Negative.   Hematological: Negative.   Psychiatric/Behavioral: Negative.   All other systems reviewed and are negative.  Past Medical History:  Diagnosis Date  . Diverticulitis    HX OF  . EKG, abnormal    HX OF LEFT ANTERIO FASCICULAR BLOCK ON 02-15-15 EKG  . GERD (gastroesophageal reflux disease)   . Heart murmur    NOW RESOLVED  . Hypertension   . OSA (obstructive sleep apnea) 11/27/2016  . Ventral incisional hernia  Social History   Socioeconomic History  . Marital status: Married    Spouse name: Not on file  . Number of children: Not on file  . Years of education: Not on file  . Highest education level: Not on file  Occupational History  . Occupation: Sales  Tobacco Use  . Smoking status: Current Every Day Smoker    Types: Cigars    Last attempt to quit: 10/11/2004    Years since quitting: 14.9  .  Smokeless tobacco: Never Used  . Tobacco comment: Current Cigars 1 every other day -----QUIT cigarettes in 2006>> 1/2PPD x 15 years  Substance and Sexual Activity  . Alcohol use: Yes    Alcohol/week: 3.0 - 4.0 standard drinks    Types: 3 - 4 Shots of liquor per week    Comment: socially;  . Drug use: No  . Sexual activity: Not on file    Comment: still smokes cigars  Other Topics Concern  . Not on file  Social History Narrative  . Not on file   Social Determinants of Health   Financial Resource Strain:   . Difficulty of Paying Living Expenses: Not on file  Food Insecurity:   . Worried About Charity fundraiser in the Last Year: Not on file  . Ran Out of Food in the Last Year: Not on file  Transportation Needs:   . Lack of Transportation (Medical): Not on file  . Lack of Transportation (Non-Medical): Not on file  Physical Activity:   . Days of Exercise per Week: Not on file  . Minutes of Exercise per Session: Not on file  Stress:   . Feeling of Stress : Not on file  Social Connections:   . Frequency of Communication with Friends and Family: Not on file  . Frequency of Social Gatherings with Friends and Family: Not on file  . Attends Religious Services: Not on file  . Active Member of Clubs or Organizations: Not on file  . Attends Archivist Meetings: Not on file  . Marital Status: Not on file  Intimate Partner Violence:   . Fear of Current or Ex-Partner: Not on file  . Emotionally Abused: Not on file  . Physically Abused: Not on file  . Sexually Abused: Not on file    Past Surgical History:  Procedure Laterality Date  . COLON SURGERY  2006   colon resection for polyps  . COLONOSCOPY WITH PROPOFOL N/A 10/31/2016   Procedure: COLONOSCOPY WITH PROPOFOL;  Surgeon: Mauri Pole, MD;  Location: [facility];  Service: Endoscopy;  Laterality: N/A;  . HIATAL HERNIA REPAIR  1991  . INSERTION OF MESH N/A 02/20/2015   Procedure: INSERTION OF MESH;  Surgeon:  Excell Seltzer, MD;  Location: [facility];  Service: General;  Laterality: N/A;  . LAPAROSCOPIC GASTRIC BANDING  2011  . VENTRAL HERNIA REPAIR N/A 02/20/2015   Procedure: OPEN REPAIR VENTRAL INCISIONAL HERNIA;  Surgeon: Excell Seltzer, MD;  Location: [facility];  Service: General;  Laterality: N/A;  . WISDOM TOOTH EXTRACTION      Family History  Problem Relation Age of Onset  . Heart disease Mother   . High blood pressure Mother   . Diabetes Mother   . Heart disease Maternal Aunt   . Cancer Maternal Uncle        pancreatic  . Heart disease Maternal Uncle   . Rheum arthritis Maternal Grandmother   . High blood pressure Maternal Grandmother   . Colon cancer Neg Hx  Allergies  Allergen Reactions  . Hydromet [Hydrocodone-Homatropine] Itching  . Iodine-131 Itching  . Iohexol Hives and Itching    PT DEVELOPS ITCHING AND HIVES SEVERAL HOURS AFTER INJECTION OF OMNIPAQUE   . Penicillins Itching    Has patient had a PCN reaction causing immediate rash, facial/tongue/throat swelling, SOB or lightheadedness with hypotension: Yes Has patient had a PCN reaction causing severe rash involving mucus membranes or skin necrosis: No Has patient had a PCN reaction that required hospitalization: No Has patient had a PCN reaction occurring within the last 10 years: No If all of the above answers are "NO", then may proceed with Cephalosporin use.   . Red Dye Itching    Current Outpatient Medications on File Prior to Visit  Medication Sig Dispense Refill  . ALPRAZolam (XANAX) 0.5 MG tablet TAKE 1 TABLET DAILY AS NEEDED 90 tablet 1  . fluticasone (FLONASE) 50 MCG/ACT nasal spray Place 2 sprays into both nostrils daily. 16 g 6  . furosemide (LASIX) 40 MG tablet Take 1 tablet (40 mg total) by mouth 2 (two) times daily. 180 tablet 1  . ibuprofen (ADVIL,MOTRIN) 800 MG tablet Take 1 tablet (800 mg total) by mouth 3 (three) times daily. 21 tablet 0  . losartan (COZAAR) 100 MG tablet Take 100 mg by mouth  daily.    . phosphorus (K PHOS NEUTRAL) 155-852-130 MG tablet Take 2 tablets (500 mg total) by mouth 3 (three) times daily. 3 tablet 0  . potassium chloride SA (K-DUR) 20 MEQ tablet Take 1 tablet (20 mEq total) by mouth daily. 90 tablet 1  . thiamine (VITAMIN B-1) 100 MG tablet Take 1 tablet (100 mg total) by mouth daily. 90 tablet 3  . triamcinolone cream (KENALOG) 0.1 % APPLY TOPICALLY TWICE A DAY PRN 30 g 3  . valsartan (DIOVAN) 80 MG tablet TAKE 1 TABLET DAILY 90 tablet 0  . verapamil (CALAN-SR) 240 MG CR tablet Take 1 tablet by mouth once daily 90 tablet 1   No current facility-administered medications on file prior to visit.    BP 134/82   Pulse 66   Temp 98.2 F (36.8 C) (Other (Comment))   Ht 6\' 1"  (1.854 m)   Wt (!) 331 lb (150.1 kg)   SpO2 99%   BMI 43.67 kg/m       Objective:   Physical Exam Vitals and nursing note reviewed.  Constitutional:      General: He is not in acute distress.    Appearance: Normal appearance. He is well-developed. He is obese. He is not diaphoretic.  HENT:     Head: Normocephalic and atraumatic.     Right Ear: Tympanic membrane, ear canal and external ear normal.     Left Ear: Tympanic membrane, ear canal and external ear normal.     Nose: Nose normal. No congestion or rhinorrhea.     Mouth/Throat:     Mouth: Mucous membranes are moist.     Pharynx: Oropharynx is clear. No oropharyngeal exudate or posterior oropharyngeal erythema.  Eyes:     General:        Right eye: No discharge.        Left eye: No discharge.     Conjunctiva/sclera: Conjunctivae normal.     Pupils: Pupils are equal, round, and reactive to light.  Neck:     Thyroid: No thyromegaly.     Trachea: No tracheal deviation.  Cardiovascular:     Rate and Rhythm: Normal rate and regular rhythm.  Pulses: Normal pulses.     Heart sounds: Normal heart sounds. No murmur. No friction rub. No gallop.   Pulmonary:     Effort: Pulmonary effort is normal. No respiratory  distress.     Breath sounds: Normal breath sounds. No stridor. No wheezing, rhonchi or rales.  Chest:     Chest wall: No tenderness.  Abdominal:     General: Bowel sounds are normal. There is no distension.     Palpations: Abdomen is soft. There is no mass.     Tenderness: There is no abdominal tenderness. There is no right CVA tenderness, left CVA tenderness, guarding or rebound.     Hernia: No hernia is present.  Musculoskeletal:        General: No swelling, tenderness, deformity or signs of injury. Normal range of motion.     Right lower leg: No edema.     Left lower leg: No edema.  Lymphadenopathy:     Cervical: No cervical adenopathy.  Skin:    General: Skin is warm and dry.     Capillary Refill: Capillary refill takes less than 2 seconds.     Coloration: Skin is not jaundiced or pale.     Findings: No bruising, erythema, lesion or rash.  Neurological:     General: No focal deficit present.     Mental Status: He is alert and oriented to person, place, and time. Mental status is at baseline.     Cranial Nerves: No cranial nerve deficit.     Sensory: No sensory deficit.     Motor: No weakness.     Coordination: Coordination normal.     Gait: Gait normal.     Deep Tendon Reflexes: Reflexes normal.  Psychiatric:        Mood and Affect: Mood normal.        Behavior: Behavior normal.        Thought Content: Thought content normal.        Judgment: Judgment normal.       Assessment & Plan:  1. Routine general medical examination at a health care facility - Follow up in one year or sooner if needed - encouraged weight loss through lifestyle modifications  - CBC with Differential/Platelet - Comprehensive metabolic panel - Hemoglobin A1c - Lipid panel - TSH  2. Essential hypertension - Well controlled.  - No change in medications  - CBC with Differential/Platelet - Comprehensive metabolic panel - Hemoglobin A1c - Lipid panel - TSH  3. OSA (obstructive sleep  apnea) - Continue with CPAP   4. Prostate cancer screening  - PSA  5. LVH (left ventricular hypertrophy) - Continue with Lasix  - Appears euvolemic today  - CBC with Differential/Platelet - Comprehensive metabolic panel - Hemoglobin A1c - Lipid panel - TSH  6. Insomnia, unspecified type - Continue with xanax  7. History of gastric bypass  - CBC with Differential/Platelet - Comprehensive metabolic panel - Hemoglobin A1c - Lipid panel - TSH - Folate - Vitamin B1 - Vitamin D, 25-hydroxy - IBC + Ferritin   [name], NP

## 2019-09-15 NOTE — Patient Instructions (Signed)
It was great seeing you today!   I will follow up with you regarding your blood work   Please let me know if you need anything   Health Maintenance, Male A healthy lifestyle and preventative care can promote health and wellness.  Maintain regular health, dental, and eye exams.  Eat a healthy diet. Foods like vegetables, fruits, whole grains, low-fat dairy products, and lean protein foods contain the nutrients you need and are low in calories. Decrease your intake of foods high in solid fats, added sugars, and salt. Get information about a proper diet from your health care provider, if necessary.  Regular physical exercise is one of the most important things you can do for your health. Most adults should get at least 150 minutes of moderate-intensity exercise (any activity that increases your heart rate and causes you to sweat) each week. In addition, most adults need muscle-strengthening exercises on 2 or more days a week.   Maintain a healthy weight. The body mass index (BMI) is a screening tool to identify possible weight problems. It provides an estimate of body fat based on height and weight. Your health care provider can find your BMI and can help you achieve or maintain a healthy weight. For males 20 years and older:  A BMI below 18.5 is considered underweight.  A BMI of 18.5 to 24.9 is normal.  A BMI of 25 to 29.9 is considered overweight.  A BMI of 30 and above is considered obese.  Maintain normal blood lipids and cholesterol by exercising and minimizing your intake of saturated fat. Eat a balanced diet with plenty of fruits and vegetables. Blood tests for lipids and cholesterol should begin at age 31 and be repeated every 5 years. If your lipid or cholesterol levels are high, you are over age 27, or you are at high risk for heart disease, you may need your cholesterol levels checked more frequently.Ongoing high lipid and cholesterol levels should be treated with medicines if diet  and exercise are not working.  If you smoke, find out from your health care provider how to quit. If you do not use tobacco, do not start.  Lung cancer screening is recommended for adults aged 73-80 years who are at high risk for developing lung cancer because of a history of smoking. A yearly low-dose CT scan of the lungs is recommended for people who have at least a 30-pack-year history of smoking and are current smokers or have quit within the past 15 years. A pack year of smoking is smoking an average of 1 pack of cigarettes a day for 1 year (for example, a 30-pack-year history of smoking could mean smoking 1 pack a day for 30 years or 2 packs a day for 15 years). Yearly screening should continue until the smoker has stopped smoking for at least 15 years. Yearly screening should be stopped for people who develop a health problem that would prevent them from having lung cancer treatment.  If you choose to drink alcohol, do not have more than 2 drinks per day. One drink is considered to be 12 oz (360 mL) of beer, 5 oz (150 mL) of wine, or 1.5 oz (45 mL) of liquor.  Avoid the use of street drugs. Do not share needles with anyone. Ask for help if you need support or instructions about stopping the use of drugs.  High blood pressure causes heart disease and increases the risk of stroke. High blood pressure is more likely to develop in:  People who have blood pressure in the end of the normal range (100-139/85-89 mm Hg).  People who are overweight or obese.  People who are African American.  If you are 65-61 years of age, have your blood pressure checked every 3-5 years. If you are 68 years of age or older, have your blood pressure checked every year. You should have your blood pressure measured twice--once when you are at a hospital or clinic, and once when you are not at a hospital or clinic. Record the average of the two measurements. To check your blood pressure when you are not at a hospital or  clinic, you can use:  An automated blood pressure machine at a pharmacy.  A home blood pressure monitor.  If you are 21-21 years old, ask your health care provider if you should take aspirin to prevent heart disease.  Diabetes screening involves taking a blood sample to check your fasting blood sugar level. This should be done once every 3 years after age 67 if you are at a normal weight and without risk factors for diabetes. Testing should be considered at a younger age or be carried out more frequently if you are overweight and have at least 1 risk factor for diabetes.  Colorectal cancer can be detected and often prevented. Most routine colorectal cancer screening begins at the age of 24 and continues through age 7. However, your health care provider may recommend screening at an earlier age if you have risk factors for colon cancer. On a yearly basis, your health care provider may provide home test kits to check for hidden blood in the stool. A small camera at the end of a tube may be used to directly examine the colon (sigmoidoscopy or colonoscopy) to detect the earliest forms of colorectal cancer. Talk to your health care provider about this at age 24 when routine screening begins. A direct exam of the colon should be repeated every 5-10 years through age 61, unless early forms of precancerous polyps or small growths are found.  People who are at an increased risk for hepatitis B should be screened for this virus. You are considered at high risk for hepatitis B if:  You were born in a country where hepatitis B occurs often. Talk with your health care provider about which countries are considered high risk.  Your parents were born in a high-risk country and you have not received a shot to protect against hepatitis B (hepatitis B vaccine).  You have HIV or AIDS.  You use needles to inject street drugs.  You live with, or have sex with, someone who has hepatitis B.  You are a man who has  sex with other men (MSM).  You get hemodialysis treatment.  You take certain medicines for conditions like cancer, organ transplantation, and autoimmune conditions.  Hepatitis C blood testing is recommended for all people born from [birth year] through 1966 and any individual with known risk factors for hepatitis C.  Healthy men should no longer receive prostate-specific antigen (PSA) blood tests as part of routine cancer screening. Talk to your health care provider about prostate cancer screening.  Testicular cancer screening is not recommended for adolescents or adult males who have no symptoms. Screening includes self-exam, a health care provider exam, and other screening tests. Consult with your health care provider about any symptoms you have or any concerns you have about testicular cancer.  Practice safe sex. Use condoms and avoid high-risk sexual practices to reduce the spread of  sexually transmitted infections (STIs).  You should be screened for STIs, including gonorrhea and chlamydia if:  You are sexually active and are younger than 24 years.  You are older than 24 years, and your health care provider tells you that you are at risk for this type of infection.  Your sexual activity has changed since you were last screened, and you are at an increased risk for chlamydia or gonorrhea. Ask your health care provider if you are at risk.  If you are at risk of being infected with HIV, it is recommended that you take a prescription medicine daily to prevent HIV infection. This is called pre-exposure prophylaxis (PrEP). You are considered at risk if:  You are a man who has sex with other men (MSM).  You are a heterosexual man who is sexually active with multiple partners.  You take drugs by injection.  You are sexually active with a partner who has HIV.  Talk with your health care provider about whether you are at high risk of being infected with HIV. If you choose to begin PrEP, you should  first be tested for HIV. You should then be tested every 3 months for as long as you are taking PrEP.  Use sunscreen. Apply sunscreen liberally and repeatedly throughout the day. You should seek shade when your shadow is shorter than you. Protect yourself by wearing long sleeves, pants, a wide-brimmed hat, and sunglasses year round whenever you are outdoors.  Tell your health care provider of new moles or changes in moles, especially if there is a change in shape or color. Also, tell your health care provider if a mole is larger than the size of a pencil eraser.  A one-time screening for abdominal aortic aneurysm (AAA) and surgical repair of large AAAs by ultrasound is recommended for men aged 18-75 years who are current or former smokers.  Stay current with your vaccines (immunizations).   This information is not intended to replace advice given to you by your health care provider. Make sure you discuss any questions you have with your health care provider.   Document Released: 01/31/2008 Document Revised: 08/25/2014 Document Reviewed: 12/30/2010 Elsevier Interactive Patient Education [employer].

## 2019-09-15 NOTE — Addendum Note (Signed)
Addended by: Gwenyth Ober R on: 09/15/2019 03:12 PM   Modules accepted: Orders

## 2019-09-22 LAB — VITAMIN B1: Vitamin B1 (Thiamine): 6 nmol/L — ABNORMAL LOW (ref 8–30)

## 2019-09-24 ENCOUNTER — Other Ambulatory Visit: Payer: Self-pay | Admitting: Adult Health

## 2019-09-27 ENCOUNTER — Encounter: Payer: Self-pay | Admitting: Adult Health

## 2019-11-18 DIAGNOSIS — G4733 Obstructive sleep apnea (adult) (pediatric): Secondary | ICD-10-CM | POA: Diagnosis not present

## 2019-12-06 ENCOUNTER — Other Ambulatory Visit: Payer: Self-pay | Admitting: Adult Health

## 2020-02-26 ENCOUNTER — Other Ambulatory Visit: Payer: Self-pay | Admitting: Adult Health

## 2020-02-28 NOTE — Telephone Encounter (Signed)
SENT TO THE PHARMACY BY E-SCRIBE. 

## 2020-03-01 ENCOUNTER — Encounter: Payer: Self-pay | Admitting: Adult Health

## 2020-03-01 DIAGNOSIS — J0141 Acute recurrent pansinusitis: Secondary | ICD-10-CM

## 2020-03-06 MED ORDER — FUROSEMIDE 40 MG PO TABS: 40.0000 mg | ORAL_TABLET | Freq: Two times a day (BID) | ORAL | 1 refills | Status: DC

## 2020-03-06 MED ORDER — VITAMIN B-1 100 MG PO TABS: 100.0000 mg | ORAL_TABLET | Freq: Every day | ORAL | 1 refills | Status: DC

## 2020-03-06 MED ORDER — VALSARTAN 80 MG PO TABS: 80.0000 mg | ORAL_TABLET | Freq: Every day | ORAL | 1 refills | Status: DC

## 2020-03-06 MED ORDER — POTASSIUM CHLORIDE CRYS ER 20 MEQ PO TBCR: 20.0000 meq | EXTENDED_RELEASE_TABLET | Freq: Every day | ORAL | 1 refills | Status: DC

## 2020-03-06 MED ORDER — ALPRAZOLAM 0.5 MG PO TABS: 0.5000 mg | ORAL_TABLET | Freq: Every day | ORAL | 0 refills | Status: DC | PRN

## 2020-03-06 MED ORDER — FLUTICASONE PROPIONATE 50 MCG/ACT NA SUSP: 2.0000 | Freq: Every day | NASAL | 1 refills | Status: DC

## 2020-03-06 MED ORDER — VERAPAMIL HCL ER 240 MG PO TBCR: EXTENDED_RELEASE_TABLET | ORAL | 1 refills | Status: DC

## 2020-03-23 ENCOUNTER — Other Ambulatory Visit: Payer: Self-pay | Admitting: Adult Health

## 2020-03-23 ENCOUNTER — Encounter: Payer: Self-pay | Admitting: Adult Health

## 2020-03-23 MED ORDER — DOXYCYCLINE HYCLATE 100 MG PO CAPS
100.0000 mg | ORAL_CAPSULE | Freq: Two times a day (BID) | ORAL | 0 refills | Status: DC
Start: 2020-03-23 — End: 2020-06-26

## 2020-06-18 ENCOUNTER — Telehealth (INDEPENDENT_AMBULATORY_CARE_PROVIDER_SITE_OTHER): Payer: BC Managed Care – PPO | Admitting: Internal Medicine

## 2020-06-18 ENCOUNTER — Encounter: Payer: Self-pay | Admitting: Internal Medicine

## 2020-06-18 DIAGNOSIS — R059 Cough, unspecified: Secondary | ICD-10-CM

## 2020-06-18 DIAGNOSIS — J069 Acute upper respiratory infection, unspecified: Secondary | ICD-10-CM | POA: Diagnosis not present

## 2020-06-18 MED ORDER — HYDROCODONE-HOMATROPINE 5-1.5 MG/5ML PO SYRP: 5.0000 mL | ORAL_SOLUTION | Freq: Three times a day (TID) | ORAL | 0 refills | Status: DC | PRN

## 2020-06-18 NOTE — Progress Notes (Signed)
Virtual Visit via Video Note  I connected with@ on 06/18/20 at  3:30 PM EDT by a video enabled telemedicine application and verified that I am speaking with the correct person using two identifiers. Location patient: home Location provider:work office Persons participating in the virtual visit: patient, provider  [place] national recommendations  regarding COVID 19 pandemic   video visit is advised over in office visit for this patient.  Patient aware  of the limitations of evaluation and management by telemedicine and  availability of in person appointments. and agreed to proceed.   HPI: Chad Lyons presents for video visit sda PCP NA Hx underlying  chf  lv[facility] osa  Ht controlled  3 days ago onset  Ur congestion sore throat  like razor blades and then congestion like ahead cold and now coughing   Dry now a bit of phlegm but no fever  X low grade at onset som fatigue   No cp sob  Rash NVD  Had booster vaccine pfizer second day of sx  Cough at night hard to sleep . No one else sick  ROS: See pertinent positives and negatives per HPI.  Past Medical History:  Diagnosis Date  . Diverticulitis    HX OF  . EKG, abnormal    HX OF LEFT ANTERIO FASCICULAR BLOCK ON 02-15-15 EKG  . GERD (gastroesophageal reflux disease)   . Heart murmur    NOW RESOLVED  . Hypertension   . OSA (obstructive sleep apnea) 11/27/2016  . Ventral incisional hernia     Past Surgical History:  Procedure Laterality Date  . COLON SURGERY  2006   colon resection for polyps  . COLONOSCOPY WITH PROPOFOL N/A 10/31/2016   Procedure: COLONOSCOPY WITH PROPOFOL;  Surgeon: Mauri Pole, MD;  Location: [facility];  Service: Endoscopy;  Laterality: N/A;  . HIATAL HERNIA REPAIR  1991  . INSERTION OF MESH N/A 02/20/2015   Procedure: INSERTION OF MESH;  Surgeon: Excell Seltzer, MD;  Location: [facility];  Service: General;  Laterality: N/A;  . LAPAROSCOPIC GASTRIC BANDING  2011  . VENTRAL HERNIA REPAIR N/A 02/20/2015    Procedure: OPEN REPAIR VENTRAL INCISIONAL HERNIA;  Surgeon: Excell Seltzer, MD;  Location: [facility];  Service: General;  Laterality: N/A;  . WISDOM TOOTH EXTRACTION      Family History  Problem Relation Age of Onset  . Heart disease Mother   . High blood pressure Mother   . Diabetes Mother   . Heart disease Maternal Aunt   . Cancer Maternal Uncle        pancreatic  . Heart disease Maternal Uncle   . Rheum arthritis Maternal Grandmother   . High blood pressure Maternal Grandmother   . Colon cancer Neg Hx     Social History   Tobacco Use  . Smoking status: Current Every Day Smoker    Types: Cigars    Last attempt to quit: 10/11/2004    Years since quitting: 15.6  . Smokeless tobacco: Never Used  . Tobacco comment: Current Cigars 1 every other day -----QUIT cigarettes in 2006>> 1/2PPD x 15 years  Vaping Use  . Vaping Use: Never used  Substance Use Topics  . Alcohol use: Yes    Alcohol/week: 3.0 - 4.0 standard drinks    Types: 3 - 4 Shots of liquor per week    Comment: socially;  . Drug use: No      Current Outpatient Medications:  .  ALPRAZolam (XANAX) 0.5 MG tablet, Take 1 tablet (  0.5 mg total) by mouth daily as needed., Disp: 90 tablet, Rfl: 0 .  doxycycline (VIBRAMYCIN) 100 MG capsule, Take 1 capsule (100 mg total) by mouth 2 (two) times daily., Disp: 14 capsule, Rfl: 0 .  fluticasone (FLONASE) 50 MCG/ACT nasal spray, Place 2 sprays into both nostrils daily., Disp: 48 g, Rfl: 1 .  furosemide (LASIX) 40 MG tablet, Take 1 tablet (40 mg total) by mouth 2 (two) times daily., Disp: 180 tablet, Rfl: 1 .  HYDROcodone-homatropine (HYCODAN) 5-1.5 MG/5ML syrup, Take 5 mLs by mouth every 8 (eight) hours as needed for cough (at night)., Disp: 120 mL, Rfl: 0 .  ibuprofen (ADVIL,MOTRIN) 800 MG tablet, Take 1 tablet (800 mg total) by mouth 3 (three) times daily., Disp: 21 tablet, Rfl: 0 .  phosphorus (K PHOS NEUTRAL) 155-852-130 MG tablet, Take 2 tablets (500 mg total) by mouth 3  (three) times daily., Disp: 3 tablet, Rfl: 0 .  potassium chloride SA (KLOR-CON M20) 20 MEQ tablet, Take 1 tablet (20 mEq total) by mouth daily., Disp: 90 tablet, Rfl: 1 .  thiamine (VITAMIN B-1) 100 MG tablet, Take 1 tablet (100 mg total) by mouth daily., Disp: 90 tablet, Rfl: 1 .  triamcinolone cream (KENALOG) 0.1 %, APPLY TOPICALLY TWICE A DAY PRN, Disp: 30 g, Rfl: 3 .  valsartan (DIOVAN) 80 MG tablet, Take 1 tablet (80 mg total) by mouth daily., Disp: 90 tablet, Rfl: 1 .  verapamil (CALAN-SR) 240 MG CR tablet, TAKE 1 TABLET DAILY, Disp: 90 tablet, Rfl: 1  EXAM: BP Readings from Last 3 Encounters:  09/15/19 134/82  07/03/19 (!) 155/94  02/02/19 134/82    VITALS per patient if applicable:  GENERAL: alert, oriented, appears well and in no acute distress nontoxic-appearing respiratory congested coughing occasionally but no dyspnea edema or speech changes.  Unfortunately  signal was interrupted at least 4-5 times during the visit.  Finished with phone additionally.  HEENT: atraumatic, conjunttiva clear, no obvious abnormalities on inspection of external nose and ears  NECK: normal movements of the head and neck  LUNGS: on inspection no signs of respiratory distress, breathing rate appears normal, no obvious gross SOB, gasping or wheezing  CV: no obvious cyanosis  MS: moves all visible extremities without noticeable abnormality  PSYCH/NEURO: pleasant and cooperative, no obvious depression or anxiety, speech and thought processing grossly intact Lab Results  Component Value Date   WBC 8.8 09/15/2019   HGB 15.7 09/15/2019   HCT 47.3 09/15/2019   PLT 191.0 09/15/2019   GLUCOSE 80 09/15/2019   CHOL 139 09/15/2019   TRIG 95.0 09/15/2019   HDL 52.20 09/15/2019   LDLCALC 68 09/15/2019   ALT 28 09/15/2019   AST 31 09/15/2019   NA 141 09/15/2019   K 3.7 09/15/2019   CL 103 09/15/2019   CREATININE 0.98 09/15/2019   BUN 14 09/15/2019   CO2 29 09/15/2019   TSH 0.58 09/15/2019   PSA  0.30 09/15/2019   HGBA1C 5.8 09/15/2019    ASSESSMENT AND PLAN:  Discussed the following assessment and plan:    ICD-10-CM   1. Acute upper respiratory infection of multiple sites  J06.9   2. Cough  R05.9 HYDROcodone-homatropine (HYCODAN) 5-1.5 MG/5ML syrup    DISCONTINUED: HYDROcodone-homatropine (HYCODAN) 5-1.5 MG/5ML syrup   Seems viral uncomplicated at this time but at risk    Just got his booster vaccination.  With recent travel and symptom complex would still get Covid tested advisable. At this time do not see antibiotics as helpful supportive  treatment advised needs some help with cough at night.  Counseled. Says can take cough med with codeine derivatives   ( note itching under allergy)  Plan follow-up PCP if not improving expect a cough for another week but should be feeling better by the end of the week.  Expectant management and discussion of plan and treatment with opportunity to ask questions and all were answered. The patient agreed with the plan and demonstrated an understanding of the instructions.   Advised to call back or seek an in-person evaluation if worsening  or having  further concerns . Return if symptoms worsen or fail to improve as expected.Shanon Ace, MD

## 2020-06-26 ENCOUNTER — Telehealth (INDEPENDENT_AMBULATORY_CARE_PROVIDER_SITE_OTHER): Payer: BC Managed Care – PPO | Admitting: Adult Health

## 2020-06-26 ENCOUNTER — Encounter: Payer: Self-pay | Admitting: Adult Health

## 2020-06-26 DIAGNOSIS — J0140 Acute pansinusitis, unspecified: Secondary | ICD-10-CM | POA: Diagnosis not present

## 2020-06-26 DIAGNOSIS — R059 Cough, unspecified: Secondary | ICD-10-CM

## 2020-06-26 MED ORDER — CODEINE SULFATE 15 MG PO TABS: 15.0000 mg | ORAL_TABLET | Freq: Four times a day (QID) | ORAL | 0 refills | Status: DC | PRN

## 2020-06-26 MED ORDER — AZITHROMYCIN 250 MG PO TABS: ORAL_TABLET | ORAL | 0 refills | Status: DC

## 2020-06-26 NOTE — Progress Notes (Signed)
Virtual Visit via Video Note  I connected wit Chad Lyons on 06/26/20 at  5:00 PM EST by a video enabled telemedicine application and verified that I am speaking with the correct person using two identifiers.  Location patient: home Location provider:work or home office Persons participating in the virtual visit: patient, provider  I discussed the limitations of evaluation and management by telemedicine and the availability of in person appointments. The patient expressed understanding and agreed to proceed.   HPI: 54 year old male who was seen by another provider in the office 9 days ago for upper respiratory infection.  He was prescribed codeine cough syrup but was not able to get this filled due to being on backorder.  Reports that over the last week his symptoms have gotten worse.  He continues to have a semiproductive cough and has tried Mucinex without relief, sinus pain and pressure behind his eyes and on his forehead, headaches, and ear pain on the right side.  He has also had chills but denies fevers.  No drainage from his right ear.   ROS: See pertinent positives and negatives per HPI.  Past Medical History:  Diagnosis Date  . Diverticulitis    HX OF  . EKG, abnormal    HX OF LEFT ANTERIO FASCICULAR BLOCK ON 02-15-15 EKG  . GERD (gastroesophageal reflux disease)   . Heart murmur    NOW RESOLVED  . Hypertension   . OSA (obstructive sleep apnea) 11/27/2016  . Ventral incisional hernia     Past Surgical History:  Procedure Laterality Date  . COLON SURGERY  2006   colon resection for polyps  . COLONOSCOPY WITH PROPOFOL N/A 10/31/2016   Procedure: COLONOSCOPY WITH PROPOFOL;  Surgeon: Mauri Pole, MD;  Location: [facility];  Service: Endoscopy;  Laterality: N/A;  . HIATAL HERNIA REPAIR  1991  . INSERTION OF MESH N/A 02/20/2015   Procedure: INSERTION OF MESH;  Surgeon: Excell Seltzer, MD;  Location: [facility];  Service: General;  Laterality: N/A;  . LAPAROSCOPIC GASTRIC  BANDING  2011  . VENTRAL HERNIA REPAIR N/A 02/20/2015   Procedure: OPEN REPAIR VENTRAL INCISIONAL HERNIA;  Surgeon: Excell Seltzer, MD;  Location: [facility];  Service: General;  Laterality: N/A;  . WISDOM TOOTH EXTRACTION      Family History  Problem Relation Age of Onset  . Heart disease Mother   . High blood pressure Mother   . Diabetes Mother   . Heart disease Maternal Aunt   . Cancer Maternal Uncle        pancreatic  . Heart disease Maternal Uncle   . Rheum arthritis Maternal Grandmother   . High blood pressure Maternal Grandmother   . Colon cancer Neg Hx        Current Outpatient Medications:  .  ALPRAZolam (XANAX) 0.5 MG tablet, Take 1 tablet (0.5 mg total) by mouth daily as needed., Disp: 90 tablet, Rfl: 0 .  fluticasone (FLONASE) 50 MCG/ACT nasal spray, Place 2 sprays into both nostrils daily., Disp: 48 g, Rfl: 1 .  furosemide (LASIX) 40 MG tablet, Take 1 tablet (40 mg total) by mouth 2 (two) times daily., Disp: 180 tablet, Rfl: 1 .  ibuprofen (ADVIL,MOTRIN) 800 MG tablet, Take 1 tablet (800 mg total) by mouth 3 (three) times daily., Disp: 21 tablet, Rfl: 0 .  phosphorus (K PHOS NEUTRAL) 155-852-130 MG tablet, Take 2 tablets (500 mg total) by mouth 3 (three) times daily., Disp: 3 tablet, Rfl: 0 .  potassium chloride SA (KLOR-CON M20) 20  MEQ tablet, Take 1 tablet (20 mEq total) by mouth daily., Disp: 90 tablet, Rfl: 1 .  thiamine (VITAMIN B-1) 100 MG tablet, Take 1 tablet (100 mg total) by mouth daily., Disp: 90 tablet, Rfl: 1 .  triamcinolone cream (KENALOG) 0.1 %, APPLY TOPICALLY TWICE A DAY PRN, Disp: 30 g, Rfl: 3 .  valsartan (DIOVAN) 80 MG tablet, Take 1 tablet (80 mg total) by mouth daily., Disp: 90 tablet, Rfl: 1 .  verapamil (CALAN-SR) 240 MG CR tablet, TAKE 1 TABLET DAILY, Disp: 90 tablet, Rfl: 1 .  azithromycin (ZITHROMAX Z-PAK) 250 MG tablet, Take 2 tablets on Day 1.  Then take 1 tablet daily., Disp: 6 tablet, Rfl: 0 .  codeine 15 MG tablet, Take 1 tablet (15 mg  total) by mouth every 6 (six) hours as needed., Disp: 7 tablet, Rfl: 0  EXAM:  VITALS per patient if applicable:  GENERAL: alert, oriented, appears well and in no acute distress  HEENT: atraumatic, conjunttiva clear, no obvious abnormalities on inspection of external nose and ears  NECK: normal movements of the head and neck  LUNGS: on inspection no signs of respiratory distress, breathing rate appears normal, no obvious gross SOB, gasping or wheezing  CV: no obvious cyanosis  MS: moves all visible extremities without noticeable abnormality  PSYCH/NEURO: pleasant and cooperative, no obvious depression or anxiety, speech and thought processing grossly intact  ASSESSMENT AND PLAN:  Discussed the following assessment and plan:  Acute non-recurrent pansinusitis - Plan: azithromycin (ZITHROMAX Z-PAK) 250 MG tablet  Cough - Plan: codeine 15 MG tablet  Send in 7 days of oral codeine to help as a cough suppressant. Follow up if not resolved int he next 4-5 days     I discussed the assessment and treatment plan with the patient. The patient was provided an opportunity to ask questions and all were answered. The patient agreed with the plan and demonstrated an understanding of the instructions.   The patient was advised to call back or seek an in-person evaluation if the symptoms worsen or if the condition fails to improve as anticipated.   Chad Peng, NP

## 2020-07-05 DIAGNOSIS — G4733 Obstructive sleep apnea (adult) (pediatric): Secondary | ICD-10-CM | POA: Diagnosis not present

## 2020-07-08 ENCOUNTER — Encounter: Payer: Self-pay | Admitting: Adult Health

## 2020-07-10 ENCOUNTER — Other Ambulatory Visit: Payer: Self-pay | Admitting: Adult Health

## 2020-07-10 DIAGNOSIS — J0140 Acute pansinusitis, unspecified: Secondary | ICD-10-CM

## 2020-07-10 MED ORDER — AZITHROMYCIN 250 MG PO TABS: ORAL_TABLET | ORAL | 0 refills | Status: DC

## 2020-07-23 ENCOUNTER — Emergency Department (HOSPITAL_BASED_OUTPATIENT_CLINIC_OR_DEPARTMENT_OTHER): Payer: BC Managed Care – PPO

## 2020-07-23 ENCOUNTER — Encounter (HOSPITAL_BASED_OUTPATIENT_CLINIC_OR_DEPARTMENT_OTHER): Payer: Self-pay

## 2020-07-23 ENCOUNTER — Emergency Department (HOSPITAL_BASED_OUTPATIENT_CLINIC_OR_DEPARTMENT_OTHER)
Admission: EM | Admit: 2020-07-23 | Discharge: 2020-07-23 | Disposition: A | Discharge status: Home / Self Care | Payer: BC Managed Care – PPO | Attending: Emergency Medicine | Admitting: Emergency Medicine

## 2020-07-23 ENCOUNTER — Other Ambulatory Visit: Payer: Self-pay

## 2020-07-23 DIAGNOSIS — R059 Cough, unspecified: Secondary | ICD-10-CM | POA: Diagnosis not present

## 2020-07-23 DIAGNOSIS — I5031 Acute diastolic (congestive) heart failure: Secondary | ICD-10-CM | POA: Insufficient documentation

## 2020-07-23 DIAGNOSIS — Z79899 Other long term (current) drug therapy: Secondary | ICD-10-CM | POA: Diagnosis not present

## 2020-07-23 DIAGNOSIS — J069 Acute upper respiratory infection, unspecified: Secondary | ICD-10-CM | POA: Diagnosis not present

## 2020-07-23 DIAGNOSIS — Z20822 Contact with and (suspected) exposure to covid-19: Secondary | ICD-10-CM | POA: Diagnosis not present

## 2020-07-23 DIAGNOSIS — R0602 Shortness of breath: Secondary | ICD-10-CM | POA: Diagnosis not present

## 2020-07-23 DIAGNOSIS — I110 Hypertensive heart disease with heart failure: Secondary | ICD-10-CM | POA: Insufficient documentation

## 2020-07-23 DIAGNOSIS — R062 Wheezing: Secondary | ICD-10-CM | POA: Diagnosis not present

## 2020-07-23 DIAGNOSIS — F17290 Nicotine dependence, other tobacco product, uncomplicated: Secondary | ICD-10-CM | POA: Insufficient documentation

## 2020-07-23 LAB — RESP PANEL BY RT-PCR (FLU A&B, COVID) ARPGX2
Influenza A by PCR: NEGATIVE
Influenza B by PCR: NEGATIVE
SARS Coronavirus 2 by RT PCR: NEGATIVE

## 2020-07-23 MED ORDER — PREDNISONE 10 MG PO TABS: 40.0000 mg | ORAL_TABLET | Freq: Every day | ORAL | 0 refills | Status: AC

## 2020-07-23 MED ORDER — ALBUTEROL SULFATE HFA 108 (90 BASE) MCG/ACT IN AERS
8.0000 | INHALATION_SPRAY | Freq: Once | RESPIRATORY_TRACT | Status: AC
  Administered 2020-07-23: 8 via RESPIRATORY_TRACT
  Filled 2020-07-23: qty 6.7

## 2020-07-23 MED ORDER — PREDNISONE 50 MG PO TABS
60.0000 mg | ORAL_TABLET | Freq: Once | ORAL | Status: AC
  Administered 2020-07-23: 60 mg via ORAL
  Filled 2020-07-23: qty 1

## 2020-07-23 MED ORDER — AZITHROMYCIN 250 MG PO TABS
500.0000 mg | ORAL_TABLET | Freq: Once | ORAL | Status: AC
  Administered 2020-07-23: 500 mg via ORAL
  Filled 2020-07-23: qty 2

## 2020-07-23 MED ORDER — ACETAMINOPHEN 500 MG PO TABS
ORAL_TABLET | ORAL | Status: AC
  Administered 2020-07-23: 1000 mg
  Filled 2020-07-23: qty 2

## 2020-07-23 MED ORDER — AZITHROMYCIN 250 MG PO TABS: 250.0000 mg | ORAL_TABLET | Freq: Every day | ORAL | 0 refills | Status: AC

## 2020-07-23 NOTE — Discharge Instructions (Addendum)
You can use the albuterol inhaler, 4 puffs every 4 hours as needed for cough and shortness of breath.  If you need more than that come back to the emergency department for evaluation.

## 2020-07-23 NOTE — ED Provider Notes (Signed)
[facility] EMERGENCY DEPARTMENT Provider Note   CSN: [id number] Arrival date & time: 07/23/20  1853     History Chief Complaint  Patient presents with  . Cough    Chad Lyons is a 54 y.o. male.   Cough Cough characteristics:  Productive Sputum characteristics:  Nondescript Severity:  Moderate Onset quality:  Gradual Timing:  Intermittent Progression:  Waxing and waning Chronicity:  Recurrent Context: sick contacts and upper respiratory infection   Relieved by:  Nothing Worsened by:  Nothing Ineffective treatments:  None tried Associated symptoms: chills, fever, rhinorrhea, shortness of breath and sinus congestion   Associated symptoms: no chest pain, no headaches and no rash        Past Medical History:  Diagnosis Date  . Diverticulitis    HX OF  . EKG, abnormal    HX OF LEFT ANTERIO FASCICULAR BLOCK ON 02-15-15 EKG  . GERD (gastroesophageal reflux disease)   . Heart murmur    NOW RESOLVED  . Hypertension   . OSA (obstructive sleep apnea) 11/27/2016  . Ventral incisional hernia     Patient Active Problem List   Diagnosis Date Noted  . Hypophosphatemia 02/24/2017  . Pulmonary HTN ([place]) 02/23/2017  . Mediastinal adenopathy 02/23/2017  . Hypokalemia 02/23/2017  . Acute diastolic CHF (congestive heart failure) ([place]) 02/23/2017  . LVH (left ventricular hypertrophy)   . Elevated troponin 02/22/2017  . Abnormal LFTs 02/22/2017  . Acute CHF (congestive heart failure) ([place]) 02/22/2017  . CHF (congestive heart failure) ([place]) 02/21/2017  . OSA (obstructive sleep apnea) 11/27/2016  . Polyp of ascending colon   . Polyp of rectum   . Ventral incisional hernia 02/20/2015  . Fitting and adjustment of gastric lap band 12/11/2011  . ABDOMINAL MASS, RIGHT LOWER QUADRANT 03/17/2007  . OBESITY, MORBID 01/22/2007  . Essential hypertension 01/22/2007  . 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 DIVERTICULITIS, HX OF 01/22/2007    Past  Surgical History:  Procedure Laterality Date  . COLON SURGERY  2006   colon resection for polyps  . COLONOSCOPY WITH PROPOFOL N/A 10/31/2016   Procedure: COLONOSCOPY WITH PROPOFOL;  Surgeon: Mauri Pole, MD;  Location: [facility];  Service: Endoscopy;  Laterality: N/A;  . HIATAL HERNIA REPAIR  1991  . INSERTION OF MESH N/A 02/20/2015   Procedure: INSERTION OF MESH;  Surgeon: Excell Seltzer, MD;  Location: [facility];  Service: General;  Laterality: N/A;  . LAPAROSCOPIC GASTRIC BANDING  2011  . VENTRAL HERNIA REPAIR N/A 02/20/2015   Procedure: OPEN REPAIR VENTRAL INCISIONAL HERNIA;  Surgeon: Excell Seltzer, MD;  Location: [facility];  Service: General;  Laterality: N/A;  . WISDOM TOOTH EXTRACTION         Family History  Problem Relation Age of Onset  . Heart disease Mother   . High blood pressure Mother   . Diabetes Mother   . Heart disease Maternal Aunt   . Cancer Maternal Uncle        pancreatic  . Heart disease Maternal Uncle   . Rheum arthritis Maternal Grandmother   . High blood pressure Maternal Grandmother   . Colon cancer Neg Hx     Social History   Tobacco Use  . Smoking status: Current Every Day Smoker    Types: Cigars    Last attempt to quit: 10/11/2004    Years since quitting: 15.7  . Smokeless tobacco: Never Used  . Tobacco comment: Current Cigars 1 every other day -----QUIT cigarettes in 2006>> 1/2PPD x  15 years  Vaping Use  . Vaping Use: Never used  Substance Use Topics  . Alcohol use: Yes    Alcohol/week: 3.0 - 4.0 standard drinks    Types: 3 - 4 Shots of liquor per week    Comment: socially;  . Drug use: No    Home Medications Prior to Admission medications   Medication Sig Start Date End Date Taking? Authorizing Provider  ALPRAZolam Duanne Moron) 0.5 MG tablet Take 1 tablet (0.5 mg total) by mouth daily as needed. 03/06/20   Nafziger, Tommi Rumps, NP  azithromycin (ZITHROMAX Z-PAK) 250 MG tablet Take 2 tablets on Day 1.  Then take 1 tablet daily. 07/10/20    Nafziger, Tommi Rumps, NP  azithromycin (ZITHROMAX) 250 MG tablet Take 1 tablet (250 mg total) by mouth daily for 4 days. Take first 2 tablets together, then 1 every day until finished. 07/23/20 07/27/20  Breck Coons, MD  codeine 15 MG tablet Take 1 tablet (15 mg total) by mouth every 6 (six) hours as needed. 06/26/20   Nafziger, Tommi Rumps, NP  fluticasone (FLONASE) 50 MCG/ACT nasal spray Place 2 sprays into both nostrils daily. 03/06/20   Nafziger, Tommi Rumps, NP  furosemide (LASIX) 40 MG tablet Take 1 tablet (40 mg total) by mouth 2 (two) times daily. 03/06/20   Nafziger, Tommi Rumps, NP  ibuprofen (ADVIL,MOTRIN) 800 MG tablet Take 1 tablet (800 mg total) by mouth 3 (three) times daily. 09/20/17   Isla Pence, MD  phosphorus (K PHOS NEUTRAL) 778-242-353 MG tablet Take 2 tablets (500 mg total) by mouth 3 (three) times daily. 02/24/17   Raiford Noble Latif, DO  potassium chloride SA (KLOR-CON M20) 20 MEQ tablet Take 1 tablet (20 mEq total) by mouth daily. 03/06/20   Nafziger, Tommi Rumps, NP  predniSONE (DELTASONE) 10 MG tablet Take 4 tablets (40 mg total) by mouth daily for 4 days. 07/23/20 07/27/20  Breck Coons, MD  thiamine (VITAMIN B-1) 100 MG tablet Take 1 tablet (100 mg total) by mouth daily. 03/06/20   Nafziger, Tommi Rumps, NP  triamcinolone cream (KENALOG) 0.1 % APPLY TOPICALLY TWICE A DAY PRN 07/20/19   Nafziger, Tommi Rumps, NP  valsartan (DIOVAN) 80 MG tablet Take 1 tablet (80 mg total) by mouth daily. 03/06/20   Nafziger, Tommi Rumps, NP  verapamil (CALAN-SR) 240 MG CR tablet TAKE 1 TABLET DAILY 03/06/20   Nafziger, Tommi Rumps, NP    Allergies    Hydromet [hydrocodone-homatropine], Iodine-131, Iohexol, Penicillins, and Red dye  Review of Systems   Review of Systems  Constitutional: Positive for chills and fever.  HENT: Positive for rhinorrhea. Negative for congestion.   Respiratory: Positive for cough and shortness of breath.   Cardiovascular: Negative for chest pain and palpitations.  Gastrointestinal: Negative for diarrhea, nausea and  vomiting.  Genitourinary: Negative for difficulty urinating and dysuria.  Musculoskeletal: Negative for arthralgias and back pain.  Skin: Negative for color change and rash.  Neurological: Negative for light-headedness and headaches.    Physical Exam Updated Vital Signs BP (!) 151/85 (BP Location: Left Arm)   Pulse 70   Temp 98.7 F (37.1 C) (Oral)   Resp 20   SpO2 98%   Physical Exam Vitals and nursing note reviewed.  Constitutional:      General: He is not in acute distress.    Appearance: Normal appearance.  HENT:     Head: Normocephalic and atraumatic.     Nose: No rhinorrhea.     Mouth/Throat:     Mouth: Mucous membranes are moist.  Eyes:  General:        Right eye: No discharge.        Left eye: No discharge.     Conjunctiva/sclera: Conjunctivae normal.  Cardiovascular:     Rate and Rhythm: Normal rate and regular rhythm.  Pulmonary:     Effort: Pulmonary effort is normal.     Breath sounds: No stridor. Wheezing (all fields) present.  Abdominal:     General: Abdomen is flat. There is no distension.     Palpations: Abdomen is soft.  Musculoskeletal:        General: No deformity or signs of injury.  Skin:    General: Skin is warm and dry.     Capillary Refill: Capillary refill takes less than 2 seconds.  Neurological:     General: No focal deficit present.     Mental Status: He is alert. Mental status is at baseline.     Motor: No weakness.  Psychiatric:        Mood and Affect: Mood normal.        Behavior: Behavior normal.        Thought Content: Thought content normal.     ED Results / Procedures / Treatments   Labs (all labs ordered are listed, but only abnormal results are displayed) Labs Reviewed  RESP PANEL BY RT-PCR (FLU A&B, COVID) [id number]    EKG None  Radiology DG Chest Portable 1 View  Result Date: 07/23/2020 CLINICAL DATA:  Cough, shortness of breath. EXAM: PORTABLE CHEST 1 VIEW COMPARISON:  July 03, 2019. FINDINGS: Stable  cardiomegaly. No pneumothorax or pleural effusion is noted. Lungs are clear. Bony thorax is unremarkable. IMPRESSION: No active disease. Electronically Signed   By: Marijo Conception M.D.   On: 07/23/2020 19:34    Procedures Procedures (including critical care time)  Medications Ordered in ED Medications  predniSONE (DELTASONE) tablet 60 mg (has no administration in time range)  azithromycin (ZITHROMAX) tablet 500 mg (has no administration in time range)  acetaminophen (TYLENOL) 500 MG tablet (1,000 mg  Given 07/23/20 1906)  albuterol (VENTOLIN HFA) 108 (90 Base) MCG/ACT inhaler 8 puff (8 puffs Inhalation Given 07/23/20 2243)    ED Course  I have reviewed the triage vital signs and the nursing notes.  Pertinent labs & imaging results that were available during my care of the patient were reviewed by me and considered in my medical decision making (see chart for details).    MDM Rules/Calculators/A&P                          54 year old male comes in with concerns for URI, likely obtained from his grandchildren at Thanksgiving.  They had similar symptoms.  He is febrile but responds well to antipyretics.  He is work of breathing is normal but he is wheezing throughout.  He has had azithromycin in the past which is helped but had return of symptoms.  He does smoke cigars daily.  He has a history of cigarette smoking.  Likely has chronic bronchitis, however will need formal testing, he is counseled on this.  He will get albuterol he will get steroids and will get a Z-Pak.  Patient agrees this plan, x-ray reviewed by radiology myself shows stable cardiomegaly with no acute cardiopulmonary pathology.  No focal lung sounds.  I feel he safe for outpatient management.  Covid test is negative flu test is negative.  Strict return precautions discussed. Final Clinical Impression(s) / ED Diagnoses Final  diagnoses:  Cough  Upper respiratory tract infection, unspecified type  Wheezing    Rx / DC  Orders ED Discharge Orders         Ordered    azithromycin (ZITHROMAX) 250 MG tablet  Daily        07/23/20 2245    predniSONE (DELTASONE) 10 MG tablet  Daily        07/23/20 2245           Breck Coons, MD 07/23/20 2249

## 2020-07-23 NOTE — ED Triage Notes (Signed)
C/o flu like sx since "the end of October"-video visit with PCP with 2 rounds of zpack-NAD-steady gait

## 2020-09-17 ENCOUNTER — Other Ambulatory Visit: Payer: Self-pay | Admitting: Adult Health

## 2020-09-18 NOTE — Telephone Encounter (Signed)
30 DAY SUPPLY SENT TO THE PHARMACY BY E-SCRIBE.  LEFT A MESSAGE ON THE PATIENT'S VOICEMAIL INFORMING HIM THAT HE IS DUE FOR CPX.

## 2020-10-01 ENCOUNTER — Other Ambulatory Visit: Payer: Self-pay | Admitting: Adult Health

## 2020-10-03 DIAGNOSIS — G4733 Obstructive sleep apnea (adult) (pediatric): Secondary | ICD-10-CM | POA: Diagnosis not present

## 2020-10-03 NOTE — Telephone Encounter (Signed)
30 DAY SUPPLY SENT TO THE PHARMACY BY E-SCRIBE.  PT IS DUE FOR CPX.

## 2020-10-15 ENCOUNTER — Other Ambulatory Visit: Payer: Self-pay | Admitting: Adult Health

## 2020-10-30 ENCOUNTER — Other Ambulatory Visit: Payer: Self-pay | Admitting: Adult Health

## 2020-11-13 ENCOUNTER — Other Ambulatory Visit: Payer: Self-pay | Admitting: Adult Health

## 2020-11-22 ENCOUNTER — Encounter: Payer: Self-pay | Admitting: Adult Health

## 2020-11-22 ENCOUNTER — Other Ambulatory Visit: Payer: Self-pay

## 2020-11-22 ENCOUNTER — Ambulatory Visit (INDEPENDENT_AMBULATORY_CARE_PROVIDER_SITE_OTHER): Payer: BC Managed Care – PPO | Admitting: Adult Health

## 2020-11-22 VITALS — BP 130/90 | HR 57 | Temp 98.1°F | Ht 73.5 in | Wt 320.9 lb

## 2020-11-22 DIAGNOSIS — I5032 Chronic diastolic (congestive) heart failure: Secondary | ICD-10-CM | POA: Diagnosis not present

## 2020-11-22 DIAGNOSIS — Z125 Encounter for screening for malignant neoplasm of prostate: Secondary | ICD-10-CM | POA: Diagnosis not present

## 2020-11-22 DIAGNOSIS — G4700 Insomnia, unspecified: Secondary | ICD-10-CM

## 2020-11-22 DIAGNOSIS — G4733 Obstructive sleep apnea (adult) (pediatric): Secondary | ICD-10-CM | POA: Diagnosis not present

## 2020-11-22 DIAGNOSIS — Z9884 Bariatric surgery status: Secondary | ICD-10-CM

## 2020-11-22 DIAGNOSIS — Z0000 Encounter for general adult medical examination without abnormal findings: Secondary | ICD-10-CM

## 2020-11-22 DIAGNOSIS — I10 Essential (primary) hypertension: Secondary | ICD-10-CM | POA: Diagnosis not present

## 2020-11-22 LAB — CBC WITH DIFFERENTIAL/PLATELET
Basophils Absolute: 0 10*3/uL (ref 0.0–0.1)
Basophils Relative: 0.6 % (ref 0.0–3.0)
Eosinophils Absolute: 0.2 10*3/uL (ref 0.0–0.7)
Eosinophils Relative: 3 % (ref 0.0–5.0)
HCT: 46.6 % (ref 39.0–52.0)
Hemoglobin: 15.2 g/dL (ref 13.0–17.0)
Lymphocytes Relative: 16.9 % (ref 12.0–46.0)
Lymphs Abs: 1.2 10*3/uL (ref 0.7–4.0)
MCHC: 32.7 g/dL (ref 30.0–36.0)
MCV: 90.4 fl (ref 78.0–100.0)
Monocytes Absolute: 0.7 10*3/uL (ref 0.1–1.0)
Monocytes Relative: 9.4 % (ref 3.0–12.0)
Neutro Abs: 5.1 10*3/uL (ref 1.4–7.7)
Neutrophils Relative %: 70.1 % (ref 43.0–77.0)
Platelets: 231 10*3/uL (ref 150.0–400.0)
RBC: 5.16 Mil/uL (ref 4.22–5.81)
RDW: 15.5 % (ref 11.5–15.5)
WBC: 7.3 10*3/uL (ref 4.0–10.5)

## 2020-11-22 LAB — COMPREHENSIVE METABOLIC PANEL
ALT: 25 U/L (ref 0–53)
AST: 22 U/L (ref 0–37)
Albumin: 4 g/dL (ref 3.5–5.2)
Alkaline Phosphatase: 87 U/L (ref 39–117)
BUN: 11 mg/dL (ref 6–23)
CO2: 27 mEq/L (ref 19–32)
Calcium: 9.4 mg/dL (ref 8.4–10.5)
Chloride: 106 mEq/L (ref 96–112)
Creatinine, Ser: 0.89 mg/dL (ref 0.40–1.50)
GFR: 96.38 mL/min (ref >60.00)
Glucose, Bld: 89 mg/dL (ref 70–99)
Potassium: 4.3 mEq/L (ref 3.5–5.1)
Sodium: 140 mEq/L (ref 135–145)
Total Bilirubin: 0.6 mg/dL (ref 0.2–1.2)
Total Protein: 6.9 g/dL (ref 6.0–8.3)

## 2020-11-22 LAB — IBC + FERRITIN
Ferritin: 80.9 ng/mL (ref 22.0–322.0)
Iron: 54 ug/dL (ref 42–165)
Saturation Ratios: 14.4 % — ABNORMAL LOW (ref 20.0–50.0)
Transferrin: 268 mg/dL (ref 212.0–360.0)

## 2020-11-22 LAB — LIPID PANEL
Cholesterol: 112 mg/dL (ref 0–200)
HDL: 41.8 mg/dL (ref >39.00)
LDL Cholesterol: 49 mg/dL (ref 0–99)
NonHDL: 70.35
Total CHOL/HDL Ratio: 3
Triglycerides: 106 mg/dL (ref 0.0–149.0)
VLDL: 21.2 mg/dL (ref 0.0–40.0)

## 2020-11-22 LAB — HEMOGLOBIN A1C: Hgb A1c MFr Bld: 5.3 % (ref 4.6–6.5)

## 2020-11-22 LAB — TSH: TSH: 0.82 u[IU]/mL (ref 0.35–4.50)

## 2020-11-22 LAB — FOLATE: Folate: 10 ng/mL (ref >5.9)

## 2020-11-22 LAB — VITAMIN D 25 HYDROXY (VIT D DEFICIENCY, FRACTURES): VITD: 34.95 ng/mL (ref 30.00–100.00)

## 2020-11-22 LAB — PSA: PSA: 0.21 ng/mL (ref 0.10–4.00)

## 2020-11-22 MED ORDER — ALPRAZOLAM 0.5 MG PO TABS: 0.5000 mg | ORAL_TABLET | Freq: Every day | ORAL | 0 refills | Status: DC | PRN

## 2020-11-22 NOTE — Progress Notes (Signed)
Subjective:    Patient ID: Chad Lyons, male    DOB: 1965/09/12, 55 y.o.   MRN: [id number]  HPI Patient presents for yearly preventative medicine examination. He is a pleasant 55 year old male who  has a past medical history of Diverticulitis, EKG, abnormal, GERD (gastroesophageal reflux disease), Heart murmur, Hypertension, OSA (obstructive sleep apnea) (11/27/2016), and Ventral incisional hernia.  Essential Hypertension -currently on triple therapy with Diovan 80 mg daily, verapamil 240 mg nightly, and Lasix 40 mg daily.  He denies dizziness, lightheadedness, chest pain, or shortness of breath BP Readings from Last 3 Encounters:  11/22/20 130/90  07/23/20 (!) 151/85  09/15/19 134/82   Insomnia - uses Xanax 0.5 mg PRN   OSA -CPAP therapy.  Uses his CPAP every night.  Does report getting good sleep, no daytime somnolence.  Is followed by pulmonary on a yearly basis  Chronic Diastolic CHF-managed with Lasix 40 mg.  He denies shortness of breath with exertion, chest pain, lower extremity edema, or palpitations.  Obesity-had bariatric surgery in 2011 and lost over 100 pounds.  His heaviest weight was 450 pounds. Wt Readings from Last 3 Encounters:  11/22/20 (!) 320 lb 14.4 oz (145.6 kg)  09/15/19 (!) 331 lb (150.1 kg)  07/03/19 (!) 320 lb (145.2 kg)   All immunizations and health maintenance protocols were reviewed with the patient and needed orders were placed.  Appropriate screening laboratory values were ordered for the patient including screening of hyperlipidemia, renal function and hepatic function. If indicated by BPH, a PSA was ordered.  Medication reconciliation,  past medical history, social history, problem list and allergies were reviewed in detail with the patient  Goals were established with regard to weight loss, exercise, and  diet in compliance with medications  He is up-to-date on routine colon cancer screening  Review of Systems  Constitutional:  Negative.   HENT: Negative.   Eyes: Negative.   Respiratory: Negative.   Cardiovascular: Negative.   Gastrointestinal: Negative.   Endocrine: Negative.   Genitourinary: Negative.   Musculoskeletal: Negative.   Skin: Negative.   Allergic/Immunologic: Negative.   Neurological: Negative.   Hematological: Negative.   Psychiatric/Behavioral: Negative.   All other systems reviewed and are negative.  Past Medical History:  Diagnosis Date  . Diverticulitis    HX OF  . EKG, abnormal    HX OF LEFT ANTERIO FASCICULAR BLOCK ON 02-15-15 EKG  . GERD (gastroesophageal reflux disease)   . Heart murmur    NOW RESOLVED  . Hypertension   . OSA (obstructive sleep apnea) 11/27/2016  . Ventral incisional hernia     Social History   Socioeconomic History  . Marital status: Married    Spouse name: Not on file  . Number of children: Not on file  . Years of education: Not on file  . Highest education level: Not on file  Occupational History  . Occupation: Sales  Tobacco Use  . Smoking status: Current Every Day Smoker    Types: Cigars    Last attempt to quit: 10/11/2004    Years since quitting: 16.1  . Smokeless tobacco: Never Used  . Tobacco comment: Current Cigars 1 every other day -----QUIT cigarettes in 2006>> 1/2PPD x 15 years  Vaping Use  . Vaping Use: Never used  Substance and Sexual Activity  . Alcohol use: Yes    Alcohol/week: 3.0 - 4.0 standard drinks    Types: 3 - 4 Shots of liquor per week    Comment: socially;  .  Drug use: No  . Sexual activity: Not on file    Comment: still smokes cigars  Other Topics Concern  . Not on file  Social History Narrative  . Not on file   Social Determinants of Health   Financial Resource Strain: Not on file  Food Insecurity: Not on file  Transportation Needs: Not on file  Physical Activity: Not on file  Stress: Not on file  Social Connections: Not on file  Intimate Partner Violence: Not on file    Past Surgical History:  Procedure  Laterality Date  . COLON SURGERY  2006   colon resection for polyps  . COLONOSCOPY WITH PROPOFOL N/A 10/31/2016   Procedure: COLONOSCOPY WITH PROPOFOL;  Surgeon: Mauri Pole, MD;  Location: [facility];  Service: Endoscopy;  Laterality: N/A;  . HIATAL HERNIA REPAIR  1991  . INSERTION OF MESH N/A 02/20/2015   Procedure: INSERTION OF MESH;  Surgeon: Excell Seltzer, MD;  Location: [facility];  Service: General;  Laterality: N/A;  . LAPAROSCOPIC GASTRIC BANDING  2011  . VENTRAL HERNIA REPAIR N/A 02/20/2015   Procedure: OPEN REPAIR VENTRAL INCISIONAL HERNIA;  Surgeon: Excell Seltzer, MD;  Location: [facility];  Service: General;  Laterality: N/A;  . WISDOM TOOTH EXTRACTION      Family History  Problem Relation Age of Onset  . Heart disease Mother   . High blood pressure Mother   . Diabetes Mother   . Heart disease Maternal Aunt   . Cancer Maternal Uncle        pancreatic  . Heart disease Maternal Uncle   . Rheum arthritis Maternal Grandmother   . High blood pressure Maternal Grandmother   . Colon cancer Neg Hx     Allergies  Allergen Reactions  . Hydromet [Hydrocodone-Homatropine] Itching  . Iodine-131 Itching  . Iohexol Hives and Itching    PT DEVELOPS ITCHING AND HIVES SEVERAL HOURS AFTER INJECTION OF OMNIPAQUE   . Penicillins Itching    Has patient had a PCN reaction causing immediate rash, facial/tongue/throat swelling, SOB or lightheadedness with hypotension: Yes Has patient had a PCN reaction causing severe rash involving mucus membranes or skin necrosis: No Has patient had a PCN reaction that required hospitalization: No Has patient had a PCN reaction occurring within the last 10 years: No If all of the above answers are "NO", then may proceed with Cephalosporin use.   . Red Dye Itching    Current Outpatient Medications on File Prior to Visit  Medication Sig Dispense Refill  . ALPRAZolam (XANAX) 0.5 MG tablet Take 1 tablet (0.5 mg total) by mouth daily as needed. 90  tablet 0  . fluticasone (FLONASE) 50 MCG/ACT nasal spray Place 2 sprays into both nostrils daily. 48 g 1  . furosemide (LASIX) 40 MG tablet TAKE 1 TABLET BY MOUTH TWICE DAILY 180 tablet 0  . ibuprofen (ADVIL,MOTRIN) 800 MG tablet Take 1 tablet (800 mg total) by mouth 3 (three) times daily. 21 tablet 0  . potassium chloride SA (KLOR-CON) 20 MEQ tablet TAKE 1 TABLET BY MOUTH DAILY 90 tablet 1  . thiamine (VITAMIN B-1) 100 MG tablet Take 1 tablet (100 mg total) by mouth daily. 90 tablet 1  . triamcinolone cream (KENALOG) 0.1 % APPLY TOPICALLY TWICE A DAY PRN 30 g 3  . valsartan (DIOVAN) 80 MG tablet TAKE 1 TABLET BY MOUTH DAILY 90 tablet 0  . verapamil (CALAN-SR) 240 MG CR tablet TAKE 1 TABLET BY MOUTH DAILY 30 tablet 0   No current  facility-administered medications on file prior to visit.    BP 130/90 (BP Location: Left Arm, Patient Position: Sitting, Cuff Size: Large)   Pulse (!) 57   Temp 98.1 F (36.7 C) (Oral)   Ht 6' 1.5" (1.867 m)   Wt (!) 320 lb 14.4 oz (145.6 kg)   SpO2 97%   BMI 41.76 kg/m       Objective:   Physical Exam Vitals and nursing note reviewed.  Constitutional:      General: He is not in acute distress.    Appearance: Normal appearance. He is well-developed. He is obese.  HENT:     Head: Normocephalic and atraumatic.     Right Ear: Tympanic membrane, ear canal and external ear normal. There is no impacted cerumen.     Left Ear: Tympanic membrane, ear canal and external ear normal. There is no impacted cerumen.     Nose: Nose normal. No congestion or rhinorrhea.     Mouth/Throat:     Mouth: Mucous membranes are moist.     Pharynx: Oropharynx is clear. No oropharyngeal exudate or posterior oropharyngeal erythema.  Eyes:     General:        Right eye: No discharge.        Left eye: No discharge.     Extraocular Movements: Extraocular movements intact.     Conjunctiva/sclera: Conjunctivae normal.     Pupils: Pupils are equal, round, and reactive to light.   Neck:     Vascular: No carotid bruit.     Trachea: No tracheal deviation.  Cardiovascular:     Rate and Rhythm: Normal rate and regular rhythm.     Pulses: Normal pulses.     Heart sounds: Normal heart sounds. No murmur heard. No friction rub. No gallop.   Pulmonary:     Effort: Pulmonary effort is normal. No respiratory distress.     Breath sounds: Normal breath sounds. No stridor. No wheezing, rhonchi or rales.  Chest:     Chest wall: No tenderness.  Abdominal:     General: Bowel sounds are normal. There is no distension.     Palpations: Abdomen is soft. There is no mass.     Tenderness: There is no abdominal tenderness. There is no right CVA tenderness, left CVA tenderness, guarding or rebound.     Hernia: No hernia is present.  Musculoskeletal:        General: No swelling, tenderness, deformity or signs of injury. Normal range of motion.     Right lower leg: No edema.     Left lower leg: No edema.  Lymphadenopathy:     Cervical: No cervical adenopathy.  Skin:    General: Skin is warm and dry.     Capillary Refill: Capillary refill takes less than 2 seconds.     Coloration: Skin is not jaundiced or pale.     Findings: No bruising, erythema, lesion or rash.  Neurological:     General: No focal deficit present.     Mental Status: He is alert and oriented to person, place, and time.     Cranial Nerves: No cranial nerve deficit.     Sensory: No sensory deficit.     Motor: No weakness.     Coordination: Coordination normal.     Gait: Gait normal.     Deep Tendon Reflexes: Reflexes normal.  Psychiatric:        Mood and Affect: Mood normal.        Behavior: Behavior normal.  Thought Content: Thought content normal.        Judgment: Judgment normal.       Assessment & Plan:  1. Routine general medical examination at a health care facility - Continue to work on weight loss through diet and exercise - Follow up in one year or sooner if needed - CBC with  Differential/Platelet; Future - Comprehensive metabolic panel; Future - Hemoglobin A1c; Future - Lipid panel; Future - TSH; Future  2. OSA (obstructive sleep apnea) - Continue to wear CPAP  - Follow up with Pulmonary as directed - CBC with Differential/Platelet; Future - Comprehensive metabolic panel; Future - Hemoglobin A1c; Future - Lipid panel; Future - TSH; Future  3. Prostate cancer screening  - PSA; Future  4. Insomnia, unspecified type  - ALPRAZolam (XANAX) 0.5 MG tablet; Take 1 tablet (0.5 mg total) by mouth daily as needed.  Dispense: 90 tablet; Refill: 0  5. History of gastric bypass - Continue to work on weight loss through diet and exerice - CBC with Differential/Platelet; Future - Comprehensive metabolic panel; Future - Hemoglobin A1c; Future - Lipid panel; Future - TSH; Future - Folate; Future - IBC + Ferritin; Future - Vitamin D, 25-hydroxy; Future  6. Essential hypertension - No change in medications. Controlled.  - CBC with Differential/Platelet; Future - Comprehensive metabolic panel; Future - Hemoglobin A1c; Future - Lipid panel; Future - TSH; Future  7. Chronic diastolic congestive heart failure ([place]) - Follow up with Cardiology  - Continue Lasix - CBC with Differential/Platelet; Future - Comprehensive metabolic panel; Future - Hemoglobin A1c; Future - Lipid panel; Future - TSH; Future   [name], NP

## 2020-12-03 ENCOUNTER — Other Ambulatory Visit: Payer: Self-pay | Admitting: Adult Health

## 2020-12-21 ENCOUNTER — Telehealth (INDEPENDENT_AMBULATORY_CARE_PROVIDER_SITE_OTHER): Payer: BC Managed Care – PPO | Admitting: Adult Health

## 2020-12-21 ENCOUNTER — Encounter: Payer: Self-pay | Admitting: Adult Health

## 2020-12-21 DIAGNOSIS — J069 Acute upper respiratory infection, unspecified: Secondary | ICD-10-CM | POA: Diagnosis not present

## 2020-12-21 MED ORDER — BENZONATATE 200 MG PO CAPS: 200.0000 mg | ORAL_CAPSULE | Freq: Two times a day (BID) | ORAL | 1 refills | Status: DC | PRN

## 2020-12-21 MED ORDER — PREDNISONE 10 MG PO TABS: ORAL_TABLET | ORAL | 0 refills | Status: DC

## 2020-12-21 MED ORDER — DOXYCYCLINE HYCLATE 100 MG PO CAPS: 100.0000 mg | ORAL_CAPSULE | Freq: Two times a day (BID) | ORAL | 0 refills | Status: DC

## 2020-12-21 NOTE — Progress Notes (Signed)
Virtual Visit via Video Note  I connected with Chad Lyons on 12/21/20 at 11:00 AM EDT by a video enabled telemedicine application and verified that I am speaking with the correct person using two identifiers.  Location patient: home Location provider:work or home office Persons participating in the virtual visit: patient, provider  I discussed the limitations of evaluation and management by telemedicine and the availability of in person appointments. The patient expressed understanding and agreed to proceed.   HPI: 55 year old male who is being evaluated today for an acute issue.  Symptoms started roughly 7 days ago and has been progressing.  Patient endorses rhinorrhea, sinus pain and pressure, nasal congestion and productive cough with yellow sputum.  Has tried over-the-counter NyQuil and Robitussin without relief.  He does get sinus infections quite frequently and feels as though his symptoms are the same.  He denies fevers or chills.  He has having to sleep sitting up to help control his cough.   ROS: See pertinent positives and negatives per HPI.  Past Medical History:  Diagnosis Date  . Diverticulitis    HX OF  . EKG, abnormal    HX OF LEFT ANTERIO FASCICULAR BLOCK ON 02-15-15 EKG  . GERD (gastroesophageal reflux disease)   . Heart murmur    NOW RESOLVED  . Hypertension   . OSA (obstructive sleep apnea) 11/27/2016  . Ventral incisional hernia     Past Surgical History:  Procedure Laterality Date  . COLON SURGERY  2006   colon resection for polyps  . COLONOSCOPY WITH PROPOFOL N/A 10/31/2016   Procedure: COLONOSCOPY WITH PROPOFOL;  Surgeon: Mauri Pole, MD;  Location: [facility];  Service: Endoscopy;  Laterality: N/A;  . HIATAL HERNIA REPAIR  1991  . INSERTION OF MESH N/A 02/20/2015   Procedure: INSERTION OF MESH;  Surgeon: Excell Seltzer, MD;  Location: [facility];  Service: General;  Laterality: N/A;  . LAPAROSCOPIC GASTRIC BANDING  2011  . VENTRAL HERNIA REPAIR  N/A 02/20/2015   Procedure: OPEN REPAIR VENTRAL INCISIONAL HERNIA;  Surgeon: Excell Seltzer, MD;  Location: [facility];  Service: General;  Laterality: N/A;  . WISDOM TOOTH EXTRACTION      Family History  Problem Relation Age of Onset  . Heart disease Mother   . High blood pressure Mother   . Diabetes Mother   . Heart disease Maternal Aunt   . Cancer Maternal Uncle        pancreatic  . Heart disease Maternal Uncle   . Rheum arthritis Maternal Grandmother   . High blood pressure Maternal Grandmother   . Colon cancer Neg Hx        Current Outpatient Medications:  .  ALPRAZolam (XANAX) 0.5 MG tablet, Take 1 tablet (0.5 mg total) by mouth daily as needed., Disp: 90 tablet, Rfl: 0 .  benzonatate (TESSALON) 200 MG capsule, Take 1 capsule (200 mg total) by mouth 2 (two) times daily as needed for cough., Disp: 20 capsule, Rfl: 1 .  doxycycline (VIBRAMYCIN) 100 MG capsule, Take 1 capsule (100 mg total) by mouth 2 (two) times daily., Disp: 14 capsule, Rfl: 0 .  fluticasone (FLONASE) 50 MCG/ACT nasal spray, Place 2 sprays into both nostrils daily., Disp: 48 g, Rfl: 1 .  furosemide (LASIX) 40 MG tablet, TAKE 1 TABLET BY MOUTH TWICE DAILY, Disp: 180 tablet, Rfl: 0 .  ibuprofen (ADVIL,MOTRIN) 800 MG tablet, Take 1 tablet (800 mg total) by mouth 3 (three) times daily., Disp: 21 tablet, Rfl: 0 .  potassium chloride  SA (KLOR-CON) 20 MEQ tablet, TAKE 1 TABLET BY MOUTH DAILY, Disp: 90 tablet, Rfl: 1 .  predniSONE (DELTASONE) 10 MG tablet, 40 mg x 3 days, 20 mg x 3 days, 10 mg x 3 days, Disp: 21 tablet, Rfl: 0 .  thiamine (VITAMIN B-1) 100 MG tablet, Take 1 tablet (100 mg total) by mouth daily., Disp: 90 tablet, Rfl: 1 .  triamcinolone cream (KENALOG) 0.1 %, APPLY TOPICALLY TWICE A DAY PRN, Disp: 30 g, Rfl: 3 .  valsartan (DIOVAN) 80 MG tablet, TAKE 1 TABLET BY MOUTH DAILY, Disp: 90 tablet, Rfl: 0 .  verapamil (CALAN-SR) 240 MG CR tablet, TAKE 1 TABLET BY MOUTH DAILY, Disp: 30 tablet, Rfl:  5  EXAM:  VITALS per patient if applicable:  GENERAL: alert, oriented, appears well and in no acute distress  HEENT: atraumatic, conjunttiva clear, no obvious abnormalities on inspection of external nose and ears  NECK: normal movements of the head and neck  LUNGS: on inspection no signs of respiratory distress, breathing rate appears normal, no obvious gross SOB, gasping or wheezing  CV: no obvious cyanosis  MS: moves all visible extremities without noticeable abnormality  PSYCH/NEURO: pleasant and cooperative, no obvious depression or anxiety, speech and thought processing grossly intact  ASSESSMENT AND PLAN:  Discussed the following assessment and plan:  1. Acute upper respiratory infection of multiple sites - will treat due to worsening symptoms.  - Advised follow up if no improvement in the next 2-3 days  - doxycycline (VIBRAMYCIN) 100 MG capsule; Take 1 capsule (100 mg total) by mouth 2 (two) times daily.  Dispense: 14 capsule; Refill: 0 - predniSONE (DELTASONE) 10 MG tablet; 40 mg x 3 days, 20 mg x 3 days, 10 mg x 3 days  Dispense: 21 tablet; Refill: 0 - benzonatate (TESSALON) 200 MG capsule; Take 1 capsule (200 mg total) by mouth 2 (two) times daily as needed for cough.  Dispense: 20 capsule; Refill: 1     I discussed the assessment and treatment plan with the patient. The patient was provided an opportunity to ask questions and all were answered. The patient agreed with the plan and demonstrated an understanding of the instructions.   The patient was advised to call back or seek an in-person evaluation if the symptoms worsen or if the condition fails to improve as anticipated.   [name], NP

## 2021-01-11 ENCOUNTER — Other Ambulatory Visit: Payer: Self-pay | Admitting: Adult Health

## 2021-01-22 ENCOUNTER — Other Ambulatory Visit: Payer: Self-pay | Admitting: Adult Health

## 2021-02-07 ENCOUNTER — Encounter: Payer: Self-pay | Admitting: Adult Health

## 2021-02-07 ENCOUNTER — Other Ambulatory Visit: Payer: Self-pay

## 2021-02-07 MED ORDER — VERAPAMIL HCL ER 240 MG PO TBCR
240.0000 mg | EXTENDED_RELEASE_TABLET | Freq: Every day | ORAL | 0 refills | Status: DC
Start: 2021-02-07 — End: 2021-04-16

## 2021-02-07 NOTE — Telephone Encounter (Signed)
He will need an in person OV for this

## 2021-02-07 NOTE — Telephone Encounter (Signed)
FYI

## 2021-02-07 NOTE — Telephone Encounter (Signed)
FYI Spoke with pt advised him of Dr Sarajane Jews message, stated that he is currently in [place] for business and he is not going to be back in [place] soon, pt state that he will try get to urgent care close to him if the problem gets worse

## 2021-04-16 ENCOUNTER — Other Ambulatory Visit: Payer: Self-pay | Admitting: Adult Health

## 2021-04-16 MED ORDER — VERAPAMIL HCL ER 240 MG PO TBCR
240.0000 mg | EXTENDED_RELEASE_TABLET | Freq: Every day | ORAL | 0 refills | Status: DC
Start: 2021-04-16 — End: 2021-07-16

## 2021-04-16 MED ORDER — FUROSEMIDE 40 MG PO TABS
40.0000 mg | ORAL_TABLET | Freq: Two times a day (BID) | ORAL | 0 refills | Status: DC
Start: 2021-04-16 — End: 2021-08-06

## 2021-04-16 MED ORDER — VALSARTAN 80 MG PO TABS
80.0000 mg | ORAL_TABLET | Freq: Every day | ORAL | 0 refills | Status: DC
Start: 2021-04-16 — End: 2021-09-07

## 2021-06-28 DIAGNOSIS — Z9884 Bariatric surgery status: Secondary | ICD-10-CM | POA: Diagnosis not present

## 2021-06-28 DIAGNOSIS — R632 Polyphagia: Secondary | ICD-10-CM | POA: Diagnosis not present

## 2021-06-28 DIAGNOSIS — R1110 Vomiting, unspecified: Secondary | ICD-10-CM | POA: Diagnosis not present

## 2021-07-04 ENCOUNTER — Other Ambulatory Visit: Payer: Self-pay | Admitting: Student

## 2021-07-04 DIAGNOSIS — R1110 Vomiting, unspecified: Secondary | ICD-10-CM

## 2021-07-15 ENCOUNTER — Encounter: Payer: Self-pay | Admitting: Family Medicine

## 2021-07-15 ENCOUNTER — Telehealth (INDEPENDENT_AMBULATORY_CARE_PROVIDER_SITE_OTHER): Payer: BC Managed Care – PPO | Admitting: Family Medicine

## 2021-07-15 ENCOUNTER — Other Ambulatory Visit: Payer: Self-pay | Admitting: Adult Health

## 2021-07-15 DIAGNOSIS — J320 Chronic maxillary sinusitis: Secondary | ICD-10-CM | POA: Diagnosis not present

## 2021-07-15 DIAGNOSIS — R051 Acute cough: Secondary | ICD-10-CM

## 2021-07-15 MED ORDER — BENZONATATE 200 MG PO CAPS: 200.0000 mg | ORAL_CAPSULE | Freq: Two times a day (BID) | ORAL | 0 refills | Status: DC | PRN

## 2021-07-15 MED ORDER — AZITHROMYCIN 250 MG PO TABS: ORAL_TABLET | ORAL | 0 refills | Status: AC

## 2021-07-15 NOTE — Progress Notes (Signed)
Virtual Visit via Telephone Note  I connected with Shela Leff on 07/15/21 at  8:30 AM EST by telephone and verified that I am speaking with the correct person using two identifiers.   I discussed the limitations, risks, security and privacy concerns of performing an evaluation and management service by telephone and the availability of in person appointments. I also discussed with the patient that there may be a patient responsible charge related to this service. The patient expressed understanding and agreed to proceed.  Location patient: home Location provider: work or home office Participants present for the call: patient, provider Patient did not have a visit in the prior 7 days to address this/these issue(s).  Chief Complaint  Patient presents with   Cough    Went to grandkids party last Saturday, 11/19, and has had productive cough since. States he and his wife have been in house since last Tuesday being safe. Has taken Ibuprofen, but states he feels week and has some chest congestion.     History of Present Illness: Pt states symptoms started 1 wk ago, prior to Thanksgiving wknd.  Symptoms started with a productive cough.  Pt had a subjective fever on Thanksgiving day, had chills. Endorses HAs, popping in ears, pressure underneath eye sockets, rhinorrhea-started this wknd.  Has decreased appetite.   Denies dental pressure, sore throat, n/v, diarrhea.  Pt was around the grandkids a few wks when his symptoms started.  Pt's wife has also been sick. Has taken Robitussin, Coricidin, Nyquil.    Observations/Objective: Patient sounds cheerful and well on the phone. I do not appreciate any SOB. Speech and thought processing are grossly intact. Patient reported vitals:  Assessment and Plan: Maxillary sinusitis, unspecified chronicity  -supportive care including tylenol, fluids, rest -start abx.  PCN allergy.  No issues with Azithromycin in the past. -Advised to start OTC  Flonase -given precautions - Plan: azithromycin (ZITHROMAX) 250 MG tablet  Cough -likely postviral and 2/2 postnasal drainage. -Continue supportive care -Given precautions. -Plan: Tessalon  Follow Up Instructions: F/u prn   [id number] 5-10 [id number] 11-20 9443 21-30 I did not refer this patient for an OV in the next 24 hours for this/these issue(s).  I discussed the assessment and treatment plan with the patient. The patient was provided an opportunity to ask questions and all were answered. The patient agreed with the plan and demonstrated an understanding of the instructions.   The patient was advised to call back or seek an in-person evaluation if the symptoms worsen or if the condition fails to improve as anticipated.  I provided 14:35 minutes of non-face-to-face time during this encounter.   Billie Ruddy, MD

## 2021-07-19 ENCOUNTER — Other Ambulatory Visit: Payer: BC Managed Care – PPO

## 2021-07-22 ENCOUNTER — Other Ambulatory Visit: Payer: BC Managed Care – PPO

## 2021-07-24 ENCOUNTER — Ambulatory Visit
Admission: RE | Admit: 2021-07-24 | Discharge: 2021-07-24 | Disposition: A | Discharge status: Home / Self Care | Payer: BC Managed Care – PPO | Source: Ambulatory Visit | Attending: Student | Admitting: Student

## 2021-07-24 ENCOUNTER — Other Ambulatory Visit: Payer: Self-pay | Admitting: Student

## 2021-07-24 DIAGNOSIS — R1110 Vomiting, unspecified: Secondary | ICD-10-CM | POA: Diagnosis not present

## 2021-07-24 DIAGNOSIS — K312 Hourglass stricture and stenosis of stomach: Secondary | ICD-10-CM | POA: Diagnosis not present

## 2021-08-06 ENCOUNTER — Other Ambulatory Visit: Payer: Self-pay | Admitting: Family Medicine

## 2021-08-06 ENCOUNTER — Other Ambulatory Visit: Payer: Self-pay | Admitting: Adult Health

## 2021-08-06 DIAGNOSIS — G4700 Insomnia, unspecified: Secondary | ICD-10-CM

## 2021-08-06 DIAGNOSIS — R051 Acute cough: Secondary | ICD-10-CM

## 2021-08-08 MED ORDER — VERAPAMIL HCL ER 240 MG PO TBCR: 240.0000 mg | EXTENDED_RELEASE_TABLET | Freq: Every day | ORAL | 1 refills | Status: DC

## 2021-08-08 MED ORDER — BENZONATATE 200 MG PO CAPS: 200.0000 mg | ORAL_CAPSULE | Freq: Two times a day (BID) | ORAL | 0 refills | Status: DC | PRN

## 2021-08-08 MED ORDER — FUROSEMIDE 40 MG PO TABS: 40.0000 mg | ORAL_TABLET | Freq: Two times a day (BID) | ORAL | 1 refills | Status: DC

## 2021-08-08 MED ORDER — ALPRAZOLAM 0.5 MG PO TABS: 0.5000 mg | ORAL_TABLET | Freq: Every day | ORAL | 0 refills | Status: DC | PRN

## 2021-08-22 ENCOUNTER — Encounter: Payer: Self-pay | Admitting: Adult Health

## 2021-08-30 ENCOUNTER — Other Ambulatory Visit: Payer: Self-pay | Admitting: Adult Health

## 2021-08-30 DIAGNOSIS — G4700 Insomnia, unspecified: Secondary | ICD-10-CM

## 2021-08-30 DIAGNOSIS — R051 Acute cough: Secondary | ICD-10-CM

## 2021-09-03 ENCOUNTER — Telehealth: Payer: Self-pay | Admitting: Adult Health

## 2021-09-03 NOTE — Telephone Encounter (Signed)
Patient calling in with respiratory symptoms: Shortness of breath, chest pain, palpitations or other red words send to Triage  Does the patient have a fever over 100, cough, congestion, sore throat, runny nose, lost of taste/smell (please list symptoms that patient has)?Sinus issues, SOB, sweating  What date did symptoms start? About a week ago (If over 5 days ago, pt may be scheduled for in person visit)  Have you tested for Covid in the last 5 days? Yes   If yes, was it positive OR negative [x] ? If positive in the last 5 days, please schedule virtual visit now. If negative, schedule for an in person OV with the next available provider if PCP has no openings. Please also let patient know they will be tested again (follow the script below)  "you will have to arrive 43mins prior to your appt time to be Covid tested. Please park in back of office at the [facility] & call [phone] to let the staff know you have arrived. A staff member will meet you at your car to do a rapid covid test. Once the test has resulted you will be notified by phone of your results to determine if appt will remain an in person visit or be converted to a virtual/phone visit. If you arrive less than 31mins before your appt time, your visit will be automatically converted to virtual & any recommended testing will happen AFTER the visit."   [place]  If no availability for virtual visit in office,  please schedule another [place] office  If no availability at another [place] office, please instruct patient that they can schedule an evisit or virtual visit through their mychart account. Visits up to 8pm  patients can be seen in office 5 days after positive COVID test

## 2021-09-04 ENCOUNTER — Observation Stay (HOSPITAL_BASED_OUTPATIENT_CLINIC_OR_DEPARTMENT_OTHER)
Admission: EM | Admit: 2021-09-04 | Discharge: 2021-09-07 | Disposition: A | Discharge status: Home / Self Care | Payer: 59 | Attending: Internal Medicine | Admitting: Internal Medicine

## 2021-09-04 ENCOUNTER — Encounter (HOSPITAL_BASED_OUTPATIENT_CLINIC_OR_DEPARTMENT_OTHER): Payer: Self-pay

## 2021-09-04 ENCOUNTER — Emergency Department (HOSPITAL_BASED_OUTPATIENT_CLINIC_OR_DEPARTMENT_OTHER): Payer: 59 | Admitting: Radiology

## 2021-09-04 ENCOUNTER — Ambulatory Visit: Payer: BC Managed Care – PPO | Admitting: Adult Health

## 2021-09-04 ENCOUNTER — Other Ambulatory Visit: Payer: Self-pay

## 2021-09-04 DIAGNOSIS — I110 Hypertensive heart disease with heart failure: Principal | ICD-10-CM | POA: Insufficient documentation

## 2021-09-04 DIAGNOSIS — F17290 Nicotine dependence, other tobacco product, uncomplicated: Secondary | ICD-10-CM | POA: Insufficient documentation

## 2021-09-04 DIAGNOSIS — I5021 Acute systolic (congestive) heart failure: Secondary | ICD-10-CM

## 2021-09-04 DIAGNOSIS — Z79899 Other long term (current) drug therapy: Secondary | ICD-10-CM | POA: Diagnosis not present

## 2021-09-04 DIAGNOSIS — I10 Essential (primary) hypertension: Secondary | ICD-10-CM | POA: Diagnosis present

## 2021-09-04 DIAGNOSIS — Z20822 Contact with and (suspected) exposure to covid-19: Secondary | ICD-10-CM | POA: Diagnosis not present

## 2021-09-04 DIAGNOSIS — R0602 Shortness of breath: Secondary | ICD-10-CM | POA: Diagnosis present

## 2021-09-04 DIAGNOSIS — R0603 Acute respiratory distress: Secondary | ICD-10-CM

## 2021-09-04 DIAGNOSIS — I5043 Acute on chronic combined systolic (congestive) and diastolic (congestive) heart failure: Secondary | ICD-10-CM | POA: Insufficient documentation

## 2021-09-04 DIAGNOSIS — I509 Heart failure, unspecified: Secondary | ICD-10-CM

## 2021-09-04 LAB — COMPREHENSIVE METABOLIC PANEL
ALT: 32 U/L (ref 0–44)
AST: 34 U/L (ref 15–41)
Albumin: 4.2 g/dL (ref 3.5–5.0)
Alkaline Phosphatase: 94 U/L (ref 38–126)
Anion gap: 11 (ref 5–15)
BUN: 11 mg/dL (ref 6–20)
CO2: 24 mmol/L (ref 22–32)
Calcium: 9.5 mg/dL (ref 8.9–10.3)
Chloride: 105 mmol/L (ref 98–111)
Creatinine, Ser: 0.96 mg/dL (ref 0.61–1.24)
GFR, Estimated: >60 mL/min (ref >60)
Glucose, Bld: 107 mg/dL — ABNORMAL HIGH (ref 70–99)
Potassium: 3.7 mmol/L (ref 3.5–5.1)
Sodium: 140 mmol/L (ref 135–145)
Total Bilirubin: 1.5 mg/dL — ABNORMAL HIGH (ref 0.3–1.2)
Total Protein: 7.5 g/dL (ref 6.5–8.1)

## 2021-09-04 LAB — CBC WITH DIFFERENTIAL/PLATELET
Abs Immature Granulocytes: 0.03 10*3/uL (ref 0.00–0.07)
Basophils Absolute: 0.1 10*3/uL (ref 0.0–0.1)
Basophils Relative: 1 %
Eosinophils Absolute: 0.1 10*3/uL (ref 0.0–0.5)
Eosinophils Relative: 1 %
HCT: 43.9 % (ref 39.0–52.0)
Hemoglobin: 14.2 g/dL (ref 13.0–17.0)
Immature Granulocytes: 0 %
Lymphocytes Relative: 12 %
Lymphs Abs: 1 10*3/uL (ref 0.7–4.0)
MCH: 28.6 pg (ref 26.0–34.0)
MCHC: 32.3 g/dL (ref 30.0–36.0)
MCV: 88.5 fL (ref 80.0–100.0)
Monocytes Absolute: 0.8 10*3/uL (ref 0.1–1.0)
Monocytes Relative: 10 %
Neutro Abs: 6.1 10*3/uL (ref 1.7–7.7)
Neutrophils Relative %: 76 %
Platelets: 215 10*3/uL (ref 150–400)
RBC: 4.96 MIL/uL (ref 4.22–5.81)
RDW: 14.6 % (ref 11.5–15.5)
WBC: 8 10*3/uL (ref 4.0–10.5)
nRBC: 0 % (ref 0.0–0.2)

## 2021-09-04 LAB — RESP PANEL BY RT-PCR (FLU A&B, COVID) ARPGX2
Influenza A by PCR: NEGATIVE
Influenza B by PCR: NEGATIVE
SARS Coronavirus 2 by RT PCR: NEGATIVE

## 2021-09-04 LAB — BRAIN NATRIURETIC PEPTIDE: B Natriuretic Peptide: 1042.7 pg/mL — ABNORMAL HIGH (ref 0.0–100.0)

## 2021-09-04 MED ORDER — FUROSEMIDE 10 MG/ML IJ SOLN
40.0000 mg | Freq: Once | INTRAMUSCULAR | Status: AC
  Administered 2021-09-04: 40 mg via INTRAVENOUS
  Filled 2021-09-04: qty 4

## 2021-09-04 NOTE — ED Triage Notes (Signed)
Recent air travel, SOB that began on Monday walking thru airport. No chest pain. Feels like he is retaining a lot of fluid, on lasix taking extra yesterday. Decreased appetite.

## 2021-09-04 NOTE — Progress Notes (Addendum)
[place]: Psychologist, sport and exercise Requesting Physician/APP: Dr. Kathrynn Humble, MD  History: Chad Lyons is a 56 year old male with past medical history significant for essential hypertension, morbid obesity, OSA, diastolic congestive heart failure who presented to [facility] with progressive shortness of breath over the last 2 months.  Also reports 20-30 pound weight gain.  Also with recent travel to [place], ED MD not concerned about pulmonary embolism at this time.   Plan of Care:  Accepted to [facility] for [facility] dependent on bed availability Admit to telemetry  [facility] will assume care on arrival to accepting facility. Until arrival, care as per EDP. However, [facility] available 24/7 for questions and assistance.   Please page [place] and Consults [phone]) as soon as the patient arrives to the hospital.   Javonna Balli [place] DO

## 2021-09-04 NOTE — ED Provider Notes (Addendum)
[facility] EMERGENCY DEPT Provider Note   CSN: [id number] Arrival date & time: 09/04/21  1539     History  Chief Complaint  Patient presents with   Shortness of Breath    Chad Lyons is a 56 y.o. male.  HPI    56 year old male with history of CHF, OSA, gastric weight loss procedure comes in with chief complaint of shortness of breath.  Patient indicates that he has been having some cough for the last couple of months.  The cough gets worse when he feels hot, and he spits up a fair amount of phlegm.  More recently he had to go to a work trip to [place] on Monday.  He started noticing exertional shortness of breath at that time.  Since that incidence, the shortness of breath has gotten worse, now he gets short of breath with minimal exertion and with speaking.  Has not appreciated nighttime symptoms given that he uses CPAP.  Does indicate that he has had increased swelling in his legs and about 20 to 30 pound weight gain.  Patient is compliant with his medication.  No associated chest pain.  Pt has no hx of PE, DVT and denies any exogenous hormone (testosterone / estrogen) use, long distance travels or surgery in the past 6 weeks, active cancer, recent immobilization.    Home Medications Prior to Admission medications   Medication Sig Start Date End Date Taking? Authorizing Provider  ALPRAZolam Duanne Moron) 0.5 MG tablet Take 1 tablet (0.5 mg total) by mouth daily as needed. 08/08/21   Nafziger, Tommi Rumps, NP  benzonatate (TESSALON) 200 MG capsule Take 1 capsule (200 mg total) by mouth 2 (two) times daily as needed for cough. Patient not taking: Reported on 07/15/2021 12/21/20   [name], NP  benzonatate (TESSALON) 200 MG capsule Take 1 capsule (200 mg total) by mouth 2 (two) times daily as needed for cough. 08/08/21   Nafziger, Tommi Rumps, NP  doxycycline (VIBRAMYCIN) 100 MG capsule Take 1 capsule (100 mg total) by mouth 2 (two) times daily. 12/21/20   Nafziger, Tommi Rumps, NP   fluticasone (FLONASE) 50 MCG/ACT nasal spray Place 2 sprays into both nostrils daily. 03/06/20   Nafziger, Tommi Rumps, NP  furosemide (LASIX) 40 MG tablet Take 1 tablet (40 mg total) by mouth 2 (two) times daily. 08/08/21   Nafziger, Tommi Rumps, NP  ibuprofen (ADVIL,MOTRIN) 800 MG tablet Take 1 tablet (800 mg total) by mouth 3 (three) times daily. 09/20/17   Isla Pence, MD  potassium chloride SA (KLOR-CON) 20 MEQ tablet TAKE 1 TABLET BY MOUTH DAILY 04/16/21   Nafziger, Tommi Rumps, NP  predniSONE (DELTASONE) 10 MG tablet 40 mg x 3 days, 20 mg x 3 days, 10 mg x 3 days Patient not taking: Reported on 07/15/2021 12/21/20   [name], NP  thiamine (VITAMIN B-1) 100 MG tablet Take 1 tablet (100 mg total) by mouth daily. 03/06/20   Nafziger, Tommi Rumps, NP  triamcinolone cream (KENALOG) 0.1 % APPLY TOPICALLY TWICE A DAY PRN 07/20/19   Nafziger, Tommi Rumps, NP  valsartan (DIOVAN) 80 MG tablet Take 1 tablet (80 mg total) by mouth daily. 04/16/21   Nafziger, Tommi Rumps, NP  verapamil (CALAN-SR) 240 MG CR tablet Take 1 tablet (240 mg total) by mouth daily. 08/08/21   Nafziger, Tommi Rumps, NP      Allergies    Hydromet [hydrocodone bit-homatrop mbr], Iodine-131, Iohexol, Penicillins, and Red dye    Review of Systems   Review of Systems  Respiratory:  Positive for shortness of breath.  Cardiovascular:  Negative for chest pain.   Physical Exam Updated Vital Signs BP (!) 149/93    Pulse 64    Temp 97.8 F (36.6 C) (Oral)    Resp (!) 27    Ht 6\' 1"  (1.854 m)    Wt (!) 149.7 kg    SpO2 95%    BMI 43.54 kg/m  Physical Exam Vitals and nursing note reviewed.  Constitutional:      Appearance: He is well-developed.  HENT:     Head: Atraumatic.  Cardiovascular:     Rate and Rhythm: Normal rate.  Pulmonary:     Effort: Pulmonary effort is normal.     Breath sounds: Examination of the right-lower field reveals rales. Examination of the left-lower field reveals rales. Rales present. No decreased breath sounds, wheezing or rhonchi.   Musculoskeletal:     Cervical back: Neck supple.     Right lower leg: Edema present.     Left lower leg: Edema present.     Comments: 2+ bilateral lower extremity edema  Skin:    General: Skin is warm.  Neurological:     Mental Status: He is alert and oriented to person, place, and time.    ED Results / Procedures / Treatments   Labs (all labs ordered are listed, but only abnormal results are displayed) Labs Reviewed  COMPREHENSIVE METABOLIC PANEL - Abnormal; Notable for the following components:      Result Value   Glucose, Bld 107 (*)    Total Bilirubin 1.5 (*)    All other components within normal limits  RESP PANEL BY RT-PCR (FLU A&B, COVID) [id number]  CBC WITH DIFFERENTIAL/PLATELET  BRAIN NATRIURETIC PEPTIDE    EKG EKG Interpretation  Date/Time:  Wednesday September 04 2021 15:45:15 EST Ventricular Rate:  70 PR Interval:  178 QRS Duration: 144 QT Interval:  438 QTC Calculation: 473 R Axis:   -57 Text Interpretation: Sinus rhythm Ventricular premature complex Left bundle branch block No acute changes No significant change since last tracing Confirmed by Varney Biles ([id number]) on 09/04/2021 5:00:57 PM  Radiology DG Chest 2 View  Result Date: 09/04/2021 CLINICAL DATA:  Shortness of breath. EXAM: CHEST - 2 VIEW COMPARISON:  07/23/2020 FINDINGS: The cardiac silhouette remains mildly to moderately enlarged. There is pulmonary vascular congestion with mild interstitial opacity bilaterally. No pleural effusion or pneumothorax is identified. No acute osseous abnormality is seen. IMPRESSION: Cardiomegaly with mild interstitial opacities compatible with pulmonary edema. Electronically Signed   By: Logan Bores M.D.   On: 09/04/2021 16:23    Procedures Procedures    Medications Ordered in ED Medications  furosemide (LASIX) injection 40 mg (40 mg Intravenous Given 09/04/21 1657)    ED Course/ Medical Decision Making/ A&P Clinical Course as of 09/04/21 1719  Wed Sep 04, 2021   1715 DG Chest 2 View X-ray independently reviewed.  Patient is noted to have pulmonary edema.  IV Lasix ordered. [AN]  1716 D-dimer, quantitative Considered - PE less likely, order cancelled.  [AN]    Clinical Course User Index [AN] Varney Biles, MD                           Medical Decision Making 56 year old male with history of congestive heart failure, OSA comes in with chief complaint of shortness of breath.  It appears that he has been having chronic cough for at least the last 2 months.  More recently he has been having exertional shortness  of breath.  On exam patient has bibasilar rales, pitting edema in indicates that he has had some weight gain.  Differential diagnosis includes pulmonary edema, pleural effusion, CHF exacerbation, acute coronary syndrome, pulmonary embolism, COVID-19/flu.  Based on the history and exam, suspicion high for CHF.  X-rays ordered and reviewed independently.  It indicates bilateral lung field pulmonary edema.  EKG is not showing any acute changes, left bundle branch block appreciated without any other signs of ischemia.  Doubt ACS, in the setting of findings indicative of CHF.  No chest pain is reassuring.  Consultation made to the hospital staff.  Results discussed, and decision is made to admit the patient to the hospital staff.  Patient reassessed.  Results discussed with him.  IV Lasix given in the ED. patient indicates increased urination, no change in his symptoms.  Problems Addressed: Acute respiratory distress: acute illness or injury that poses a threat to life or bodily functions Acute systolic congestive heart failure ([place]): acute illness or injury that poses a threat to life or bodily functions  Amount and/or Complexity of Data Reviewed Independent Historian: spouse    Details: Spouse provided collateral history External Data Reviewed: radiology, ECG and notes. Labs: ordered. Decision-making details documented in ED  Course. Radiology: ordered and independent interpretation performed. Decision-making details documented in ED Course. ECG/medicine tests: ordered and independent interpretation performed.  Risk Prescription drug management. Decision regarding hospitalization.     Final Clinical Impression(s) / ED Diagnoses Final diagnoses:  Acute systolic congestive heart failure ([place])  Acute respiratory distress    Rx / DC Orders ED Discharge Orders     None         Varney Biles, MD 09/04/21 [place], Kenyette Gundy, MD 09/04/21 1719

## 2021-09-05 ENCOUNTER — Observation Stay (HOSPITAL_BASED_OUTPATIENT_CLINIC_OR_DEPARTMENT_OTHER): Payer: 59

## 2021-09-05 ENCOUNTER — Encounter (HOSPITAL_COMMUNITY): Payer: Self-pay | Admitting: Internal Medicine

## 2021-09-05 DIAGNOSIS — M7989 Other specified soft tissue disorders: Secondary | ICD-10-CM | POA: Diagnosis not present

## 2021-09-05 DIAGNOSIS — I50813 Acute on chronic right heart failure: Secondary | ICD-10-CM

## 2021-09-05 DIAGNOSIS — F17290 Nicotine dependence, other tobacco product, uncomplicated: Secondary | ICD-10-CM | POA: Diagnosis not present

## 2021-09-05 DIAGNOSIS — I5033 Acute on chronic diastolic (congestive) heart failure: Secondary | ICD-10-CM

## 2021-09-05 DIAGNOSIS — I110 Hypertensive heart disease with heart failure: Secondary | ICD-10-CM | POA: Diagnosis not present

## 2021-09-05 DIAGNOSIS — Z79899 Other long term (current) drug therapy: Secondary | ICD-10-CM | POA: Diagnosis not present

## 2021-09-05 DIAGNOSIS — R0602 Shortness of breath: Secondary | ICD-10-CM | POA: Diagnosis present

## 2021-09-05 DIAGNOSIS — I5031 Acute diastolic (congestive) heart failure: Secondary | ICD-10-CM

## 2021-09-05 DIAGNOSIS — I5043 Acute on chronic combined systolic (congestive) and diastolic (congestive) heart failure: Secondary | ICD-10-CM | POA: Diagnosis not present

## 2021-09-05 DIAGNOSIS — Z20822 Contact with and (suspected) exposure to covid-19: Secondary | ICD-10-CM | POA: Diagnosis not present

## 2021-09-05 LAB — ECHOCARDIOGRAM COMPLETE
Area-P 1/2: 5.27 cm2
Calc EF: 41.8 %
Height: 73 in
S' Lateral: 4.6 cm
Single Plane A2C EF: 37.9 %
Single Plane A4C EF: 42.3 %
Weight: 5280.46 oz

## 2021-09-05 MED ORDER — POTASSIUM CHLORIDE CRYS ER 20 MEQ PO TBCR
20.0000 meq | EXTENDED_RELEASE_TABLET | Freq: Every day | ORAL | Status: DC
  Administered 2021-09-05: 20 meq via ORAL
  Filled 2021-09-05: qty 1

## 2021-09-05 MED ORDER — IRBESARTAN 75 MG PO TABS
75.0000 mg | ORAL_TABLET | Freq: Every day | ORAL | Status: DC
  Administered 2021-09-05: 75 mg via ORAL
  Filled 2021-09-05 (×3): qty 1

## 2021-09-05 MED ORDER — SODIUM CHLORIDE 0.9% FLUSH
3.0000 mL | Freq: Two times a day (BID) | INTRAVENOUS | Status: DC
  Administered 2021-09-05 – 2021-09-07 (×5): 3 mL via INTRAVENOUS

## 2021-09-05 MED ORDER — ONDANSETRON HCL 4 MG/2ML IJ SOLN: 4.0000 mg | Freq: Four times a day (QID) | INTRAMUSCULAR | Status: DC | PRN

## 2021-09-05 MED ORDER — ACETAMINOPHEN 325 MG PO TABS
650.0000 mg | ORAL_TABLET | ORAL | Status: DC | PRN
  Administered 2021-09-05 – 2021-09-06 (×3): 650 mg via ORAL
  Filled 2021-09-05 (×3): qty 2

## 2021-09-05 MED ORDER — VERAPAMIL HCL ER 240 MG PO TBCR
240.0000 mg | EXTENDED_RELEASE_TABLET | Freq: Every day | ORAL | Status: DC
  Administered 2021-09-05: 240 mg via ORAL
  Filled 2021-09-05 (×2): qty 1

## 2021-09-05 MED ORDER — SPIRONOLACTONE 12.5 MG HALF TABLET: 12.5000 mg | ORAL_TABLET | Freq: Every day | ORAL | Status: DC

## 2021-09-05 MED ORDER — SODIUM CHLORIDE 0.9% FLUSH: 3.0000 mL | INTRAVENOUS | Status: DC | PRN

## 2021-09-05 MED ORDER — SPIRONOLACTONE 12.5 MG HALF TABLET
12.5000 mg | ORAL_TABLET | Freq: Every day | ORAL | Status: DC
  Administered 2021-09-05 – 2021-09-07 (×3): 12.5 mg via ORAL
  Filled 2021-09-05 (×3): qty 1

## 2021-09-05 MED ORDER — ACETAMINOPHEN 325 MG PO TABS
650.0000 mg | ORAL_TABLET | Freq: Once | ORAL | Status: AC
Start: 2021-09-05 — End: 2021-09-05
  Administered 2021-09-05: 650 mg via ORAL
  Filled 2021-09-05: qty 2

## 2021-09-05 MED ORDER — SODIUM CHLORIDE 0.9 % IV SOLN: 250.0000 mL | INTRAVENOUS | Status: DC | PRN

## 2021-09-05 MED ORDER — HYDROCHLOROTHIAZIDE 25 MG PO TABS: 25.0000 mg | ORAL_TABLET | Freq: Once | ORAL | Status: DC

## 2021-09-05 MED ORDER — ALPRAZOLAM 0.5 MG PO TABS: 0.5000 mg | ORAL_TABLET | Freq: Every day | ORAL | Status: DC | PRN

## 2021-09-05 MED ORDER — POTASSIUM CHLORIDE CRYS ER 20 MEQ PO TBCR
20.0000 meq | EXTENDED_RELEASE_TABLET | Freq: Two times a day (BID) | ORAL | Status: DC
Start: 2021-09-05 — End: 2021-09-07
  Administered 2021-09-05 – 2021-09-07 (×4): 20 meq via ORAL
  Filled 2021-09-05 (×4): qty 1

## 2021-09-05 MED ORDER — FUROSEMIDE 10 MG/ML IJ SOLN
40.0000 mg | Freq: Two times a day (BID) | INTRAMUSCULAR | Status: DC
  Administered 2021-09-05: 40 mg via INTRAVENOUS
  Filled 2021-09-05: qty 4

## 2021-09-05 MED ORDER — POTASSIUM CHLORIDE CRYS ER 20 MEQ PO TBCR: 40.0000 meq | EXTENDED_RELEASE_TABLET | Freq: Two times a day (BID) | ORAL | Status: DC

## 2021-09-05 MED ORDER — FUROSEMIDE 10 MG/ML IJ SOLN
40.0000 mg | Freq: Two times a day (BID) | INTRAMUSCULAR | Status: DC
  Administered 2021-09-05 – 2021-09-06 (×2): 40 mg via INTRAVENOUS
  Filled 2021-09-05 (×2): qty 4

## 2021-09-05 MED ORDER — IRBESARTAN 75 MG PO TABS: 75.0000 mg | ORAL_TABLET | Freq: Every day | ORAL | Status: DC

## 2021-09-05 NOTE — Progress Notes (Signed)
Heart Failure Nurse Navigator Progress Note  Following this hospitalization to assess for HV TOC readiness. Per chart review pt does not have primary cardiologist.   BNP 1042.7 ECHO: 2018 EF 50-55%, G2DD  ? repeat ECHO for updated information.  Pricilla Holm, MSN, RN Heart Failure Nurse Navigator [phone]

## 2021-09-05 NOTE — H&P (Signed)
History and Physical    Chad Lyons JGO:[id number] DOB: 02/11/1966 DOA: 09/04/2021  PCP: [name], NP  Patient coming from: Home  Chief Complaint: Shortness of breath  HPI: Chad Lyons is a 56 y.o. male with medical history significant of diastolic congestive heart failure comes in with several months of shortness of breath.  He takes Lasix 40 mg daily he reports he is compliant with this and it usually controls his swelling in his legs.  Sometimes he does not at which time he uses TED hose.  He denies any chest pain.  Denies any fevers or cough.  He presented yesterday to urgent care was given Lasix 40 mg IV and reports he urinated 5 L of fluid.  He is feeling better already.  Patient transferred to [facility] for further management.  His O2 sats are normal on room air.  Patient be referred for admission for acute CHF exacerbation.  He does have evidence of edema on his chest x-ray.   Review of Systems: As per HPI otherwise 10 point review of systems negative.   Past Medical History:  Diagnosis Date   Diverticulitis    HX OF   EKG, abnormal    HX OF LEFT ANTERIO FASCICULAR BLOCK ON 02-15-15 EKG   GERD (gastroesophageal reflux disease)    Heart murmur    NOW RESOLVED   Hypertension    OSA (obstructive sleep apnea) 11/27/2016   Ventral incisional hernia     Past Surgical History:  Procedure Laterality Date   COLON SURGERY  2006   colon resection for polyps   COLONOSCOPY WITH PROPOFOL N/A 10/31/2016   Procedure: COLONOSCOPY WITH PROPOFOL;  Surgeon: Mauri Pole, MD;  Location: [facility] ENDOSCOPY;  Service: Endoscopy;  Laterality: N/A;   HIATAL HERNIA REPAIR  1991   INSERTION OF MESH N/A 02/20/2015   Procedure: INSERTION OF MESH;  Surgeon: Excell Seltzer, MD;  Location: [facility] ORS;  Service: General;  Laterality: N/A;   LAPAROSCOPIC GASTRIC BANDING  2011   VENTRAL HERNIA REPAIR N/A 02/20/2015   Procedure: OPEN REPAIR VENTRAL INCISIONAL HERNIA;  Surgeon: Excell Seltzer, MD;   Location: [facility] ORS;  Service: General;  Laterality: N/A;   WISDOM TOOTH EXTRACTION       reports that he has been smoking cigars. He has never used smokeless tobacco. He reports current alcohol use of about 3.0 - 4.0 standard drinks per week. He reports that he does not use drugs.  Allergies  Allergen Reactions   Hydromet [Hydrocodone Bit-Homatrop Mbr] Itching   Iodine-131 Itching   Iohexol Hives and Itching    PT DEVELOPS ITCHING AND HIVES SEVERAL HOURS AFTER INJECTION OF OMNIPAQUE    Penicillins Itching    Has patient had a PCN reaction causing immediate rash, facial/tongue/throat swelling, SOB or lightheadedness with hypotension: Yes Has patient had a PCN reaction causing severe rash involving mucus membranes or skin necrosis: No Has patient had a PCN reaction that required hospitalization: No Has patient had a PCN reaction occurring within the last 10 years: No If all of the above answers are "NO", then may proceed with Cephalosporin use.    Red Dye Itching    Family History  Problem Relation Age of Onset   Heart disease Mother    High blood pressure Mother    Diabetes Mother    Heart disease Maternal Aunt    Cancer Maternal Uncle        pancreatic   Heart disease Maternal Uncle    Rheum arthritis  Maternal Grandmother    High blood pressure Maternal Grandmother    Colon cancer Neg Hx     Prior to Admission medications   Medication Sig Start Date End Date Taking? Authorizing Provider  acetaminophen (TYLENOL) 500 MG tablet Take 1,000 mg by mouth every 6 (six) hours as needed for headache (pain).   Yes [provider]  ALPRAZolam (XANAX) 0.5 MG tablet Take 1 tablet (0.5 mg total) by mouth daily as needed. Patient taking differently: Take 0.5 mg by mouth daily as needed for anxiety (stress). 08/08/21  Yes Nafziger, Tommi Rumps, NP  benzonatate (TESSALON) 200 MG capsule Take 1 capsule (200 mg total) by mouth 2 (two) times daily as needed for cough. 08/08/21  Yes Nafziger,  Tommi Rumps, NP  fluticasone (FLONASE) 50 MCG/ACT nasal spray Place 2 sprays into both nostrils daily. Patient taking differently: Place 2 sprays into both nostrils daily as needed for allergies or rhinitis. 03/06/20  Yes Nafziger, Tommi Rumps, NP  furosemide (LASIX) 40 MG tablet Take 1 tablet (40 mg total) by mouth 2 (two) times daily. Patient taking differently: Take 80 mg by mouth every morning. 08/08/21  Yes Nafziger, Tommi Rumps, NP  Multiple Vitamin (MULTIVITAMIN WITH MINERALS) TABS tablet Take 1 tablet by mouth every morning.   Yes [provider]  polyvinyl alcohol (LIQUIFILM TEARS) 1.4 % ophthalmic solution Place 1 drop into both eyes every morning. [place]o eye drops for dry eyes   Yes [provider]  potassium chloride SA (KLOR-CON) 20 MEQ tablet TAKE 1 TABLET BY MOUTH DAILY Patient taking differently: 20 mEq every morning. 04/16/21  Yes Nafziger, Tommi Rumps, NP  thiamine (VITAMIN B-1) 100 MG tablet Take 1 tablet (100 mg total) by mouth daily. Patient taking differently: Take 100 mg by mouth every morning. 03/06/20  Yes Nafziger, Tommi Rumps, NP  valsartan (DIOVAN) 80 MG tablet Take 1 tablet (80 mg total) by mouth daily. Patient taking differently: Take 80 mg by mouth every morning. 04/16/21  Yes Nafziger, Tommi Rumps, NP  verapamil (CALAN-SR) 240 MG CR tablet Take 1 tablet (240 mg total) by mouth daily. Patient taking differently: Take 240 mg by mouth at bedtime. 08/08/21  Yes Nafziger, Tommi Rumps, NP  vitamin C (ASCORBIC ACID) 250 MG tablet Take 250 mg by mouth every morning.   Yes [provider]    Physical Exam: Vitals:   09/05/21 0330 09/05/21 0400 09/05/21 0745 09/05/21 0902  BP: (!) 148/97 (!) 151/105 (!) 160/95 (!) 175/111  Pulse: (!) 58 (!) 58 62 68  Resp: 20 20 20    Temp:    98.6 F (37 C)  TempSrc:    Oral  SpO2: 97% 98% 98% 98%  Weight:      Height:          Constitutional: NAD, calm, comfortable Vitals:   09/05/21 0330 09/05/21 0400 09/05/21 0745 09/05/21 0902  BP: (!) 148/97 (!)  151/105 (!) 160/95 (!) 175/111  Pulse: (!) 58 (!) 58 62 68  Resp: 20 20 20    Temp:    98.6 F (37 C)  TempSrc:    Oral  SpO2: 97% 98% 98% 98%  Weight:      Height:       Eyes: PERRL, lids and conjunctivae normal ENMT: Mucous membranes are moist. Posterior pharynx clear of any exudate or lesions.Normal dentition.  Neck: normal, supple, no masses, no thyromegaly Respiratory: clear to auscultation bilaterally, no wheezing, no crackles. Normal respiratory effort. No accessory muscle use.  Cardiovascular: Regular rate and rhythm, no murmurs / rubs / gallops.  Trace bilateral pitting extremity edema. 2+ pedal pulses. No carotid bruits.  Abdomen: no tenderness, no masses palpated. No hepatosplenomegaly. Bowel sounds positive.  Musculoskeletal: no clubbing / cyanosis. No joint deformity upper and lower extremities. Good ROM, no contractures. Normal muscle tone.  Skin: no rashes, lesions, ulcers. No induration Neurologic: CN 2-12 grossly intact. Sensation intact, DTR normal. Strength 5/5 in all 4.  Psychiatric: Normal judgment and insight. Alert and oriented x 3. Normal mood.    Labs on Admission: I have personally reviewed following labs and imaging studies  CBC: Recent Labs  Lab 09/04/21 1556  WBC 8.0  NEUTROABS 6.1  HGB 14.2  HCT 43.9  MCV 88.5  PLT 570   Basic Metabolic Panel: Recent Labs  Lab 09/04/21 1556  NA 140  K 3.7  CL 105  CO2 24  GLUCOSE 107*  BUN 11  CREATININE 0.96  CALCIUM 9.5   GFR: Estimated Creatinine Clearance: 131 mL/min (by C-G formula based on SCr of 0.96 mg/dL). Liver Function Tests: Recent Labs  Lab 09/04/21 1556  AST 34  ALT 32  ALKPHOS 94  BILITOT 1.5*  PROT 7.5  ALBUMIN 4.2   No results for input(s): LIPASE, AMYLASE in the last 168 hours. No results for input(s): AMMONIA in the last 168 hours. Coagulation Profile: No results for input(s): INR, PROTIME in the last 168 hours. Cardiac Enzymes: No results for input(s): CKTOTAL, CKMB,  CKMBINDEX, TROPONINI in the last 168 hours. BNP (last 3 results) No results for input(s): PROBNP in the last 8760 hours. HbA1C: No results for input(s): HGBA1C in the last 72 hours. CBG: No results for input(s): GLUCAP in the last 168 hours. Lipid Profile: No results for input(s): CHOL, HDL, LDLCALC, TRIG, CHOLHDL, LDLDIRECT in the last 72 hours. Thyroid Function Tests: No results for input(s): TSH, T4TOTAL, FREET4, T3FREE, THYROIDAB in the last 72 hours. Anemia Panel: No results for input(s): VITAMINB12, FOLATE, FERRITIN, TIBC, IRON, RETICCTPCT in the last 72 hours. Urine analysis:    Component Value Date/Time   COLORURINE STRAW (A) 02/22/2017 1132   APPEARANCEUR CLEAR 02/22/2017 1132   LABSPEC 1.005 02/22/2017 1132   PHURINE 6.0 02/22/2017 1132   GLUCOSEU NEGATIVE 02/22/2017 1132   HGBUR NEGATIVE 02/22/2017 1132   HGBUR negative 07/27/2009 1017   BILIRUBINUR 2+ 09/27/2018 1030   KETONESUR NEGATIVE 02/22/2017 1132   PROTEINUR Positive (A) 09/27/2018 1030   PROTEINUR NEGATIVE 02/22/2017 1132   UROBILINOGEN 2.0 (A) 09/27/2018 1030   UROBILINOGEN 0.2 07/27/2009 1017   NITRITE negative 09/27/2018 1030   NITRITE NEGATIVE 02/22/2017 1132   LEUKOCYTESUR Large (3+) (A) 09/27/2018 1030   Sepsis Labs: !!!!!!!!!!!!!!!!!!!!!!!!!!!!!!!!!!!!!!!!!!!! @LABRCNTIP (procalcitonin:4,lacticidven:4) ) Recent Results (from the past 240 hour(s))  Resp Panel by RT-PCR (Flu A&B, Covid) Nasopharyngeal Swab     Status: None   Collection Time: 09/04/21  3:56 PM   Specimen: Nasopharyngeal Swab; Nasopharyngeal(NP) swabs in vial transport medium  Result Value Ref Range Status   SARS Coronavirus 2 by RT PCR NEGATIVE NEGATIVE Final    Comment: (NOTE) SARS-CoV-2 target nucleic acids are NOT DETECTED.  The SARS-CoV-2 RNA is generally detectable in upper respiratory specimens during the acute phase of infection. The lowest concentration of SARS-CoV-2 viral copies this assay can detect is 138 copies/mL. A  negative result does not preclude SARS-Cov-2 infection and should not be used as the sole basis for treatment or other patient management decisions. A negative result may occur with  improper specimen collection/handling, submission of specimen other than nasopharyngeal swab, presence of viral  mutation(s) within the areas targeted by this assay, and inadequate number of viral copies(<138 copies/mL). A negative result must be combined with clinical observations, patient history, and epidemiological information. The expected result is Negative.  Fact Sheet for Patients:  EntrepreneurPulse.com.au  Fact Sheet for Healthcare Providers:  IncredibleEmployment.be  This test is no t yet approved or cleared by the [place] FDA and  has been authorized for detection and/or diagnosis of SARS-CoV-2 by FDA under an Emergency Use Authorization (EUA). This EUA will remain  in effect (meaning this test can be used) for the duration of the COVID-19 declaration under Section 564(b)(1) of the Act, 21 U.S.C.section 360bbb-3(b)(1), unless the authorization is terminated  or revoked sooner.       Influenza A by PCR NEGATIVE NEGATIVE Final   Influenza B by PCR NEGATIVE NEGATIVE Final    Comment: (NOTE) The Xpert Xpress SARS-CoV-2/FLU/RSV plus assay is intended as an aid in the diagnosis of influenza from Nasopharyngeal swab specimens and should not be used as a sole basis for treatment. Nasal washings and aspirates are unacceptable for Xpert Xpress SARS-CoV-2/FLU/RSV testing.  Fact Sheet for Patients: EntrepreneurPulse.com.au  Fact Sheet for Healthcare Providers: IncredibleEmployment.be  This test is not yet approved or cleared by the [place] FDA and has been authorized for detection and/or diagnosis of SARS-CoV-2 by FDA under an Emergency Use Authorization (EUA). This EUA will remain in effect (meaning this test can  be used) for the duration of the COVID-19 declaration under Section 564(b)(1) of the Act, 21 U.S.C. section 360bbb-3(b)(1), unless the authorization is terminated or revoked.  Performed at [facility], [address] [place], [place] [address]      Radiological Exams on Admission: DG Chest 2 View  Result Date: 09/04/2021 CLINICAL DATA:  Shortness of breath. EXAM: CHEST - 2 VIEW COMPARISON:  07/23/2020 FINDINGS: The cardiac silhouette remains mildly to moderately enlarged. There is pulmonary vascular congestion with mild interstitial opacity bilaterally. No pleural effusion or pneumothorax is identified. No acute osseous abnormality is seen. IMPRESSION: Cardiomegaly with mild interstitial opacities compatible with pulmonary edema. Electronically Signed   By: Logan Bores M.D.   On: 09/04/2021 16:23    EKG: Independently reviewed.  Normal sinus rhythm left bundle branch block  Assessment/Plan  56 year old male with acute on chronic diastolic congestive heart failure exacerbation  Principal Problem:    Acute exacerbation of CHF (congestive heart failure) (HCC)-chest x-ray with evidence of edema.  O2 sats normal on room air.  Has not had an echo since 2018 which showed preserved EF with diastolic dysfunction.  Proceed with getting cardiac echo.  Give Lasix 40 mg IV every 12 hours.  Overall already improved.  Active Problems:    Essential hypertension-clarify resume home meds    Obesity-noted    History of obstructive sleep apnea-noted   Further recommendations pending overall hospital course   DVT prophylaxis: SCDs Code Status: Full Family Communication: None Disposition Plan: Days Consults called: None Admission status: Admission   Mareli Antunes A MD [facility]  If 7PM-7AM, please contact night-coverage [email or web address] Password [email or web address]  09/05/2021, 9:08 AM

## 2021-09-05 NOTE — Progress Notes (Signed)
Lower extremity venous bilateral study completed.   Please see CV Proc for preliminary results.   Alenna Russell, RDMS, RVT  

## 2021-09-05 NOTE — TOC Progression Note (Signed)
Transition of Care [facility]) - Progression Note    Patient Details  Name: Chad Lyons MRN: [id number] Date of Birth: 12/11/1965  Transition of [facility]) CM/SW Contact  Zenon Mayo, RN Phone Number: 09/05/2021, 11:30 AM  Clinical Narrative:     Transition of Care [facility]) Screening Note   Patient Details  Name: Chad Lyons Date of Birth: 02-12-1966   Transition of Care [facility]) CM/SW Contact:    Zenon Mayo, RN Phone Number: 09/05/2021, 11:31 AM    Transition of Care Department [facility]) has reviewed patient and no TOC needs have been identified at this time. We will continue to monitor patient advancement through interdisciplinary progression rounds. If new patient transition needs arise, please place a TOC consult.          Expected Discharge Plan and Services                                                 Social Determinants of Health (SDOH) Interventions    Readmission Risk Interventions No flowsheet data found.

## 2021-09-05 NOTE — Progress Notes (Signed)
Heart Failure Navigator Progress Note  Assessed for Heart & Vascular TOC clinic readiness.  Patient does not meet criteria due to AHF rounding team consulted this hospital. Removed HV TOC clinic appointment. Pt + spouse made aware--removed appt card from room.    Navigator available for educational resources.  Pricilla Holm, MSN, RN Heart Failure Nurse Navigator [phone]

## 2021-09-05 NOTE — Progress Notes (Signed)
Heart Failure Nurse Navigator Progress Note  PCP: [name], NP PCP-Cardiologist: none Admission Diagnosis: A CHF Admitted from: home with spouse  Presentation:   Chad Lyons presented 1/18 with increased SOB and BLE edema. Pt states he was on work trip back from [place], [place] when this started to get worse with ambulation. Noticed increase in swelling starting before thanksgiving. Pt resting in bed on room air, spouse at bedside. Pt able to speak in full sentences. Patient interactive with interview process.  Pt states he smokes 1/2PPD x 15 years, intermittently cigars. Drinks 4-6 shots of liquor most days, usually heavier on weekends for 8+ years. Spouse states concern for drinking habits--HF CSW consulted for tobacco + alcohol dependence counseling. Endorses sodium indiscretions. Educated on fluid and sodium modifications.  Pt independent and drives reliable vehicle.  Explained benefits of Heart & Vascular Transitions of Care Clinic appointment, patient agreeable.    ECHO/ LVEF: 2018 50-55%, G2DD Pending updated ECHO.   Clinical Course:  Past Medical History:  Diagnosis Date   Diverticulitis    HX OF   EKG, abnormal    HX OF LEFT ANTERIO FASCICULAR BLOCK ON 02-15-15 EKG   GERD (gastroesophageal reflux disease)    Heart murmur    NOW RESOLVED   Hypertension    OSA (obstructive sleep apnea) 11/27/2016   Ventral incisional hernia      Social History   Socioeconomic History   Marital status: Married    Spouse name: Not on file   Number of children: Not on file   Years of education: Not on file   Highest education level: Bachelor's degree (e.g., BA, AB, BS)  Occupational History   Occupation: Sales  Tobacco Use   Smoking status: Every Day    Types: Cigars    Last attempt to quit: 10/11/2004    Years since quitting: 16.9   Smokeless tobacco: Never   Tobacco comments:    Current Cigars 1 every other day -----QUIT cigarettes in 2006>> 1/2PPD x 15 years  Vaping Use    Vaping Use: Never used  Substance and Sexual Activity   Alcohol use: Yes    Alcohol/week: 3.0 - 4.0 standard drinks    Types: 3 - 4 Shots of liquor per week    Comment: socially;   Drug use: No   Sexual activity: Not on file    Comment: still smokes cigars  Other Topics Concern   Not on file  Social History Narrative   Not on file   Social Determinants of Health   Financial Resource Strain: Low Risk    Difficulty of Paying Living Expenses: Not hard at all  Food Insecurity: No Food Insecurity   Worried About Charity fundraiser in the Last Year: Never true   [place] in the Last Year: Never true  Transportation Needs: No Transportation Needs   Lack of Transportation (Medical): No   Lack of Transportation (Non-Medical): No  Physical Activity: Sufficiently Active   Days of Exercise per Week: 5 days   Minutes of Exercise per Session: 30 min  Stress: Stress Concern Present   Feeling of Stress : To some extent  Social Connections: Engineer, building services of Communication with Friends and Family: More than three times a week   Frequency of Social Gatherings with Friends and Family: Once a week   Attends Religious Services: More than 4 times per year   Active Member of [employer] or Organizations: Yes   Attends Club or  Organization Meetings: More than 4 times per year   Marital Status: Married    High Risk Criteria for Readmission and/or Poor Patient Outcomes: Heart failure hospital admissions (last 6 months): 1  No Show rate: 3% Difficult social situation: No Demonstrates medication adherence: Yes Primary Language: English Literacy level: able to read/write and comprehend.  Education Assessment and Provision:  Detailed education and instructions provided on heart failure disease management including the following:  Signs and symptoms of Heart Failure When to call the physician Importance of daily weights Low sodium diet Fluid restriction Medication  management Anticipated future follow-up appointments  Patient education given on each of the above topics.  Patient acknowledges understanding via teach back method and acceptance of all instructions.  Education Materials:  "Living Better With Heart Failure" Booklet, HF zone tool, & Daily Weight Tracker Tool.  Patient has scale at home: yes Patient has pill box at home: yes   Barriers of Care:   -dietary indiscretion  Considerations/Referrals:   Referral made to Heart Failure Pharmacist Stewardship: yes, appreciated Referral made to Heart Failure CSW/NCM TOC: no Referral made to Heart & Vascular TOC clinic: yes, 1/27 @ 11AM  Items for Follow-up on DC/TOC: -optimize -dietary modifications -continue HF education   Pricilla Holm, MSN, RN Heart Failure Nurse Navigator [phone]

## 2021-09-05 NOTE — ED Notes (Signed)
Report called to unit RN.

## 2021-09-05 NOTE — Plan of Care (Signed)
  Problem: Activity: Goal: Capacity to carry out activities will improve Outcome: Progressing   Problem: Cardiac: Goal: Ability to achieve and maintain adequate cardiopulmonary perfusion will improve Outcome: Progressing   

## 2021-09-05 NOTE — Progress Notes (Signed)
Patient have 11 beats of VT asymptomatic, MD Dr. Haroldine Laws notified . See epic for new orders. Will continue to monitor.

## 2021-09-05 NOTE — Consult Note (Addendum)
Advanced Heart Failure Team Consult Note   Primary Physician: [name], NP PCP-Cardiologist:  None  Reason for Consultation: A/C HFpEF  HPI:    Chad Lyons is seen today for evaluation of A/C HFpEF at the request of Dr Shanon Brow.   Chad Macdonnell is a 56 year old with history of HFpEF, diverticulitis, HTN, S/P Lap Band, tobacco abuse, and OSA (uses CPAP).  Smokes 1/2 PPD and drinks 6-7 shots liquor a day.   Echo in 2018 EF 50-55%.   Complaining of cough over the last couple several months. Progressive cough and dyspnea with exertion. Weight has been going up at home went from 290-->330 pounds. He had been taking oral diuretics. On Monday he flew to [place] for a business meeting and developed worsening shortness of breath. Flew back Thursday and went straight to [facility] ED.   Presented to the ED with increased shortness of breath. CXR with pulmonary edema. Pertinent labs include: Sars 2 negative, BNP 1042, creatinine 0.96, and WBC 8. In the ED given IV lasix. Transferred to [facility] for additional treatment. Brisk diuresis noted. Renal function has been stable.     Review of Systems: [y] = yes, [ ]  = no   General: Weight gain [ Y]; Weight loss [ ] ; Anorexia [ ] ; Fatigue [ Y]; Fever [ ] ; Chills [ ] ; Weakness [ Y]  Cardiac: Chest pain/pressure [ ] ; Resting SOB [ ] ; Exertional SOB [ Y]; Orthopnea [ Y]; Pedal Edema [ Y]; Palpitations [ ] ; Syncope [ ] ; Presyncope [ ] ; Paroxysmal nocturnal dyspnea[ ]   Pulmonary: Cough [ ] ; Wheezing[ ] ; Hemoptysis[ ] ; Sputum [ ] ; Snoring [ ]   GI: Vomiting[ ] ; Dysphagia[ ] ; Melena[ ] ; Hematochezia [ ] ; Heartburn[ ] ; Abdominal pain [ ] ; Constipation [ ] ; Diarrhea [ ] ; BRBPR [ ]   GU: Hematuria[ ] ; Dysuria [ ] ; Nocturia[ ]   Vascular: Pain in legs with walking [ ] ; Pain in feet with lying flat [ ] ; Non-healing sores [ ] ; Stroke [ ] ; TIA [ ] ; Slurred speech [ ] ;  Neuro: Headaches[ ] ; Vertigo[ ] ; Seizures[ ] ; Paresthesias[ ] ;Blurred vision [ ] ; Diplopia [ ] ; Vision  changes [ ]   Ortho/Skin: Arthritis [ ] ; Joint pain [ ] ; Muscle pain [ ] ; Joint swelling [ ] ; Back Pain [ ] ; Rash [ ]   Psych: Depression[ ] ; Anxiety[ ]   Heme: Bleeding problems [ ] ; Clotting disorders [ ] ; Anemia [ ]   Endocrine: Diabetes [ ] ; Thyroid dysfunction[ ]   Home Medications Prior to Admission medications   Medication Sig Start Date End Date Taking? Authorizing Provider  acetaminophen (TYLENOL) 500 MG tablet Take 1,000 mg by mouth every 6 (six) hours as needed for headache (pain).   Yes [provider]  ALPRAZolam (XANAX) 0.5 MG tablet Take 1 tablet (0.5 mg total) by mouth daily as needed. Patient taking differently: Take 0.5 mg by mouth daily as needed for anxiety (stress). 08/08/21  Yes Nafziger, Tommi Rumps, NP  benzonatate (TESSALON) 200 MG capsule Take 1 capsule (200 mg total) by mouth 2 (two) times daily as needed for cough. 08/08/21  Yes Nafziger, Tommi Rumps, NP  fluticasone (FLONASE) 50 MCG/ACT nasal spray Place 2 sprays into both nostrils daily. Patient taking differently: Place 2 sprays into both nostrils daily as needed for allergies or rhinitis. 03/06/20  Yes Nafziger, Tommi Rumps, NP  furosemide (LASIX) 40 MG tablet Take 1 tablet (40 mg total) by mouth 2 (two) times daily. Patient taking differently: Take 80 mg by mouth every morning. 08/08/21  Yes Nafziger, Tommi Rumps,  NP  Multiple Vitamin (MULTIVITAMIN WITH MINERALS) TABS tablet Take 1 tablet by mouth every morning.   Yes [provider]  polyvinyl alcohol (LIQUIFILM TEARS) 1.4 % ophthalmic solution Place 1 drop into both eyes every morning. [place] eye drops for dry eyes   Yes [provider]  potassium chloride SA (KLOR-CON) 20 MEQ tablet TAKE 1 TABLET BY MOUTH DAILY Patient taking differently: 20 mEq every morning. 04/16/21  Yes Nafziger, Tommi Rumps, NP  thiamine (VITAMIN B-1) 100 MG tablet Take 1 tablet (100 mg total) by mouth daily. Patient taking differently: Take 100 mg by mouth every morning. 03/06/20  Yes Nafziger, Tommi Rumps, NP   valsartan (DIOVAN) 80 MG tablet Take 1 tablet (80 mg total) by mouth daily. Patient taking differently: Take 80 mg by mouth every morning. 04/16/21  Yes Nafziger, Tommi Rumps, NP  verapamil (CALAN-SR) 240 MG CR tablet Take 1 tablet (240 mg total) by mouth daily. Patient taking differently: Take 240 mg by mouth at bedtime. 08/08/21  Yes Nafziger, Tommi Rumps, NP  vitamin C (ASCORBIC ACID) 250 MG tablet Take 250 mg by mouth every morning.   Yes [provider]    Past Medical History: Past Medical History:  Diagnosis Date   Diverticulitis    HX OF   EKG, abnormal    HX OF LEFT ANTERIO FASCICULAR BLOCK ON 02-15-15 EKG   GERD (gastroesophageal reflux disease)    Heart murmur    NOW RESOLVED   Hypertension    OSA (obstructive sleep apnea) 11/27/2016   Ventral incisional hernia     Past Surgical History: Past Surgical History:  Procedure Laterality Date   COLON SURGERY  2006   colon resection for polyps   COLONOSCOPY WITH PROPOFOL N/A 10/31/2016   Procedure: COLONOSCOPY WITH PROPOFOL;  Surgeon: Mauri Pole, MD;  Location: [facility] END[facility]COPY;  Service: Endoscopy;  Laterality: N/A;   HIATAL HERNIA REPAIR  1991   INSERTION OF MESH N/A 02/20/2015   Procedure: INSERTION OF MESH;  Surgeon: Excell Seltzer, MD;  Location: [facility] ORS;  Service: General;  Laterality: N/A;   LAPAROSCOPIC GASTRIC BANDING  2011   VENTRAL HERNIA REPAIR N/A 02/20/2015   Procedure: OPEN REPAIR VENTRAL INCISIONAL HERNIA;  Surgeon: Excell Seltzer, MD;  Location: [facility] ORS;  Service: General;  Laterality: N/A;   WISDOM TOOTH EXTRACTION      Family History: Family History  Problem Relation Age of Onset   Heart disease Mother    High blood pressure Mother    Diabetes Mother    Heart disease Maternal Aunt    Cancer Maternal Uncle        pancreatic   Heart disease Maternal Uncle    Rheum arthritis Maternal Grandmother    High blood pressure Maternal Grandmother    Colon cancer Neg Hx     Social History: Social History    Socioeconomic History   Marital status: Married    Spouse name: Not on file   Number of children: Not on file   Years of education: Not on file   Highest education level: Bachelor's degree (e.g., BA, AB, BS)  Occupational History   Occupation: Sales  Tobacco Use   Smoking status: Every Day    Types: Cigars    Last attempt to quit: 10/11/2004    Years since quitting: 16.9   Smokeless tobacco: Never   Tobacco comments:    Current Cigars 1 every other day -----QUIT cigarettes in 2006>> 1/2PPD x 15 years  Vaping Use   Vaping Use: Never used  Substance and Sexual Activity   Alcohol use: Yes    Alcohol/week: 3.0 - 4.0 standard drinks    Types: 3 - 4 Shots of liquor per week    Comment: socially;   Drug use: No   Sexual activity: Not on file    Comment: still smokes cigars  Other Topics Concern   Not on file  Social History Narrative   Not on file   Social Determinants of Health   Financial Resource Strain: Low Risk    Difficulty of Paying Living Expenses: Not hard at all  Food Insecurity: No Food Insecurity   Worried About Charity fundraiser in the Last Year: Never true   [place] in the Last Year: Never true  Transportation Needs: No Transportation Needs   Lack of Transportation (Medical): No   Lack of Transportation (Non-Medical): No  Physical Activity: Sufficiently Active   Days of Exercise per Week: 5 days   Minutes of Exercise per Session: 30 min  Stress: Stress Concern Present   Feeling of Stress : To some extent  Social Connections: Engineer, building services of Communication with Friends and Family: More than three times a week   Frequency of Social Gatherings with Friends and Family: Once a week   Attends Religious Services: More than 4 times per year   Active Member of [employer] or Organizations: Yes   Attends Music therapist: More than 4 times per year   Marital Status: Married    Allergies:  Allergies  Allergen Reactions    Hydromet [Hydrocodone Bit-Homatrop Mbr] Itching   Iodine-131 Itching   Iohexol Hives and Itching    PT DEVELOPS ITCHING AND HIVES SEVERAL HOURS AFTER INJECTION OF OMNIPAQUE    Penicillins Itching    Has patient had a PCN reaction causing immediate rash, facial/tongue/throat swelling, SOB or lightheadedness with hypotension: Yes Has patient had a PCN reaction causing severe rash involving mucus membranes or skin necrosis: No Has patient had a PCN reaction that required hospitalization: No Has patient had a PCN reaction occurring within the last 10 years: No If all of the above answers are "NO", then may proceed with Cephalosporin use.    Red Dye Itching    Objective:    Vital Signs:   Temp:  [97.8 F (36.6 C)-98.6 F (37 C)] 97.8 F (36.6 C) (01/19 1115) Pulse Rate:  [58-73] 58 (01/19 1115) Resp:  [17-27] 20 (01/19 1115) BP: (141-175)/(93-140) 171/106 (01/19 1115) SpO2:  [93 %-100 %] 98 % (01/19 1115) Weight:  [149.7 kg] 149.7 kg (01/18 1906)    Weight change: Filed Weights   09/04/21 1543 09/04/21 1906  Weight: (!) 149.7 kg (!) 149.7 kg    Intake/Output:   Intake/Output Summary (Last 24 hours) at 09/05/2021 1344 Last data filed at 09/05/2021 1223 Gross per 24 hour  Intake --  Output 5925 ml  Net -5925 ml      Physical Exam    General: Dyspneic when talking.  HEENT: normal Neck: supple. JVP + to jaw Carotids 2+ bilat; no bruits. No lymphadenopathy or thyromegaly appreciated. Cor: PMI nondisplaced. Regular rate & rhythm. No rubs, gallops or murmurs. Lungs: clear room air.  Abdomen: soft, nontender, nondistended. No hepatosplenomegaly. No bruits or masses. Good bowel sounds. Extremities: no cyanosis, clubbing, rash, R and :LLE 1+ edema Neuro: alert & orientedx3, cranial nerves grossly intact. moves all 4 extremities w/o difficulty. Affect pleasant   Telemetry  SR   EKG    SR  70 bpm QRS 178 ms   Labs   Basic Metabolic Panel: Recent Labs  Lab  09/04/21 1556  NA 140  K 3.7  CL 105  CO2 24  GLUCOSE 107*  BUN 11  CREATININE 0.96  CALCIUM 9.5    Liver Function Tests: Recent Labs  Lab 09/04/21 1556  AST 34  ALT 32  ALKPHOS 94  BILITOT 1.5*  PROT 7.5  ALBUMIN 4.2   No results for input(s): LIPASE, AMYLASE in the last 168 hours. No results for input(s): AMMONIA in the last 168 hours.  CBC: Recent Labs  Lab 09/04/21 1556  WBC 8.0  NEUTROABS 6.1  HGB 14.2  HCT 43.9  MCV 88.5  PLT 215    Cardiac Enzymes: No results for input(s): CKTOTAL, CKMB, CKMBINDEX, TROPONINI in the last 168 hours.  BNP: BNP (last 3 results) Recent Labs    09/04/21 1556  BNP 1,042.7*    ProBNP (last 3 results) No results for input(s): PROBNP in the last 8760 hours.   CBG: No results for input(s): GLUCAP in the last 168 hours.  Coagulation Studies: No results for input(s): LABPROT, INR in the last 72 hours.   Imaging   DG Chest 2 View  Result Date: 09/04/2021 CLINICAL DATA:  Shortness of breath. EXAM: CHEST - 2 VIEW COMPARISON:  07/23/2020 FINDINGS: The cardiac silhouette remains mildly to moderately enlarged. There is pulmonary vascular congestion with mild interstitial opacity bilaterally. No pleural effusion or pneumothorax is identified. No acute osseous abnormality is seen. IMPRESSION: Cardiomegaly with mild interstitial opacities compatible with pulmonary edema. Electronically Signed   By: Logan Bores M.D.   On: 09/04/2021 16:23     Medications:     Current Medications:  furosemide  40 mg Intravenous Q12H   irbesartan  75 mg Oral Daily   potassium chloride SA  20 mEq Oral Daily   sodium chloride flush  3 mL Intravenous Q12H   spironolactone  12.5 mg Oral Daily   verapamil  240 mg Oral Daily    Infusions:  sodium chloride        Patient Profile   Chad Lyons is a 56 year old with history of HFpEF, diverticulitis, HTN, S/P Lap Band, tobacco abuse, and OSA (uses CPAP).    Admitted with A/C HFpEF.    Assessment/Plan  Dyspnea  -Suspect in the setting of volume overload. Sats stable on room air.  - Flew to [place] earlier this week and returned Wednesday. Doubt PE but will check lower extremity dopplers. If DVT present will need VQ. + anticoagulants.   A/C HFpEF  -Echo 2018 EF 50-55% ---> repeat ECHO today  - Diuresing with IV lasix.  Has had brisk diuruesis. Continue IV lasix 40 mg twice a day.  - Add 12.5 mg spiro daily.  - Renal function stable.  - Add ted hose.   3. OSA -Uses CPAP nightly   4.  HTN  Elevated.   5. Obesity  S/P lap band in 2012  6. Tobacco Abuse  7. ETOH abuse     Length of Stay: 0  Darrick Grinder, NP  09/05/2021, 1:44 PM  Advanced Heart Failure Team Pager [phone] (M-F; 7a - 5p)  Please contact [facility] for night-coverage after hours (4p -7a ) and weekends on [email or web address]  Patient seen and examined with the above-signed Advanced Practice Provider and/or Housestaff. I personally reviewed laboratory data, imaging studies and relevant notes. I independently examined the patient and formulated the important aspects of the plan. I  have edited the note to reflect any of my changes or salient points. I have personally discussed the plan with the patient and/or family.  56 y/o male with obesity, tobacco/ETOH abuse, OSA admitted with recurrent diastolic HF in setting of dietary indiscretion + recent air travel to [place].   Says weight is up 30-40 pounds over past year or so but unclear how much of this weight gain is recent.   Drinks at least 4-5 vodka lemonades per night.   Echo at bedside I felt 50-55% + LVH G3DD. Read as 40-45% and possible HCM   Diuresing well with IV lasix. No CP.   General:  Sitting up in bed No resp difficulty HEENT: normal Neck: supple. JVP to jaw. Carotids 2+ bilat; no bruits. No lymphadenopathy or thryomegaly appreciated. Cor: PMI nondisplaced. Regular rate & rhythm. No rubs, gallops or murmurs. Lungs: clear Abdomen:  obese soft, nontender, nondistended. No hepatosplenomegaly. No bruits or masses. Good bowel sounds. Extremities: no cyanosis, clubbing, rash, 1+ edema Neuro: alert & orientedx3, cranial nerves grossly intact. moves all 4 extremities w/o difficulty. Affect pleasant  Volume status improving quickly with diuresis. Has mild residual volume overload remaining. Continue IV diuresis. Titrate GDMT. Watch for ETOH withdrawal.   Will get cMRI to further assess myocardium. Stressed importance of ETOH and tobacco cessation.   May need to consider ischemic eval depending on results of cMRI  Glori Bickers, MD  11:25 PM

## 2021-09-06 ENCOUNTER — Other Ambulatory Visit (HOSPITAL_COMMUNITY): Payer: Self-pay

## 2021-09-06 ENCOUNTER — Observation Stay (HOSPITAL_COMMUNITY): Payer: 59

## 2021-09-06 DIAGNOSIS — I509 Heart failure, unspecified: Secondary | ICD-10-CM | POA: Diagnosis not present

## 2021-09-06 DIAGNOSIS — I5033 Acute on chronic diastolic (congestive) heart failure: Secondary | ICD-10-CM | POA: Diagnosis not present

## 2021-09-06 LAB — BASIC METABOLIC PANEL
Anion gap: 7 (ref 5–15)
BUN: 13 mg/dL (ref 6–20)
CO2: 28 mmol/L (ref 22–32)
Calcium: 9.1 mg/dL (ref 8.9–10.3)
Chloride: 105 mmol/L (ref 98–111)
Creatinine, Ser: 1.25 mg/dL — ABNORMAL HIGH (ref 0.61–1.24)
GFR, Estimated: >60 mL/min (ref >60)
Glucose, Bld: 100 mg/dL — ABNORMAL HIGH (ref 70–99)
Potassium: 3.7 mmol/L (ref 3.5–5.1)
Sodium: 140 mmol/L (ref 135–145)

## 2021-09-06 MED ORDER — CARVEDILOL 3.125 MG PO TABS
3.1250 mg | ORAL_TABLET | Freq: Two times a day (BID) | ORAL | 0 refills | Status: DC
  Filled 2021-09-06: qty 60, 30d supply, fill #0

## 2021-09-06 MED ORDER — SACUBITRIL-VALSARTAN 49-51 MG PO TABS
1.0000 | ORAL_TABLET | Freq: Two times a day (BID) | ORAL | 0 refills | Status: DC
  Filled 2021-09-06: qty 60, 30d supply, fill #0

## 2021-09-06 MED ORDER — GADOBUTROL 1 MMOL/ML IV SOLN
10.0000 mL | Freq: Once | INTRAVENOUS | Status: AC | PRN
  Administered 2021-09-06: 10 mL via INTRAVENOUS

## 2021-09-06 MED ORDER — FAMOTIDINE 20 MG PO TABS
20.0000 mg | ORAL_TABLET | Freq: Every day | ORAL | Status: DC
  Administered 2021-09-06 – 2021-09-07 (×2): 20 mg via ORAL
  Filled 2021-09-06 (×2): qty 1

## 2021-09-06 MED ORDER — SPIRONOLACTONE 25 MG PO TABS
12.5000 mg | ORAL_TABLET | Freq: Every day | ORAL | 0 refills | Status: DC
  Filled 2021-09-06: qty 30, 60d supply, fill #0

## 2021-09-06 MED ORDER — CARVEDILOL 3.125 MG PO TABS
3.1250 mg | ORAL_TABLET | Freq: Two times a day (BID) | ORAL | Status: DC
  Administered 2021-09-06 – 2021-09-07 (×2): 3.125 mg via ORAL
  Filled 2021-09-06 (×3): qty 1

## 2021-09-06 MED ORDER — SACUBITRIL-VALSARTAN 49-51 MG PO TABS
1.0000 | ORAL_TABLET | Freq: Two times a day (BID) | ORAL | Status: DC
  Administered 2021-09-06 – 2021-09-07 (×3): 1 via ORAL
  Filled 2021-09-06 (×3): qty 1

## 2021-09-06 MED ORDER — FUROSEMIDE 40 MG PO TABS: 40.0000 mg | ORAL_TABLET | Freq: Every day | ORAL | Status: DC

## 2021-09-06 MED ORDER — THIAMINE HCL 100 MG PO TABS
100.0000 mg | ORAL_TABLET | Freq: Every day | ORAL | Status: DC
  Administered 2021-09-06 – 2021-09-07 (×2): 100 mg via ORAL
  Filled 2021-09-06 (×2): qty 1

## 2021-09-06 MED ORDER — ENOXAPARIN SODIUM 60 MG/0.6ML IJ SOSY
60.0000 mg | PREFILLED_SYRINGE | INTRAMUSCULAR | Status: DC
  Administered 2021-09-06 – 2021-09-07 (×2): 60 mg via SUBCUTANEOUS
  Filled 2021-09-06 (×2): qty 0.6

## 2021-09-06 NOTE — TOC Initial Note (Signed)
Transition of Care [facility]) - Initial/Assessment Note    Patient Details  Name: Chad Lyons MRN: [id number] Date of Birth: 1966/04/28  [facility]) CM/SW Contact:    Erenest Rasher, RN Phone Number: [phone] 09/06/2021, 2:19 PM  Clinical Narrative:                  HF TOC CM spoke to pt at bedside. States he is independent at home. States his CPAP is humming. [place] to order new CPAP or service CPAP. Pt states he received CPAP in 2018. Provided pt with number to follow up with [facility] # [phone] on CPAP. [place] and states agency will follow up with pt at home if he dc today.      Expected Discharge Plan: Home/Self Care Barriers to Discharge: Continued Medical Work up   Patient Goals and CMS Choice        Expected Discharge Plan and Services Expected Discharge Plan: Home/Self Care   Discharge Planning Services: CM Consult   Living arrangements for the past 2 months: Single Family Home                 DME Arranged: Continuous positive airway pressure (CPAP) DME Agency: [facility] Date DME Agency Contacted: 09/06/21 Time DME Agency Contacted: [phone]   Prior Living Arrangements/Services Living arrangements for the past 2 months: Single Family Home Lives with:: Spouse Patient language and need for interpreter reviewed:: Yes Do you feel safe going back to the place where you live?: Yes      Need for Family Participation in Patient Care: No (Comment) Care giver support system in place?: No (comment) Current home services: DME (CPAP) Criminal Activity/Legal Involvement Pertinent to Current Situation/Hospitalization: No - Comment as needed  Activities of Daily Living      Permission Sought/Granted Permission sought to share information with : Case Manager, Family Supports, PCP Permission granted to share information with : Yes, Verbal Permission Granted  Share Information with NAME: Chad Lyons  Permission granted to share info w AGENCY: DME agency  Permission granted to share info w Relationship: wife  Permission granted to share info w Contact Information: [phone]  Emotional Assessment Appearance:: Appears stated age Attitude/Demeanor/Rapport: Gracious Affect (typically observed): Accepting Orientation: : Oriented to Self, Oriented to Place, Oriented to  Time, Oriented to Situation   Psych Involvement: No (comment)  Admission diagnosis:  Acute respiratory distress [R06.03] Acute exacerbation of CHF (congestive heart failure) (HCC) [E26.8] Acute systolic congestive heart failure ([place]) [I50.21] Patient Active Problem List   Diagnosis Date Noted   Acute exacerbation of CHF (congestive heart failure) ([place]) 09/04/2021   Hypophosphatemia 02/24/2017   Pulmonary HTN ([place]) 02/23/2017   Mediastinal adenopathy 02/23/2017   Hypokalemia 34/19/6222   Acute diastolic CHF (congestive heart failure) ([place]) 02/23/2017   LVH (left ventricular hypertrophy)    Elevated troponin 02/22/2017   Abnormal LFTs 02/22/2017   Acute CHF (congestive heart failure) ([place]) 02/22/2017   CHF (congestive heart failure) ([place]) 02/21/2017   OSA (obstructive sleep apnea) 11/27/2016   Polyp of ascending colon    Polyp of rectum    Ventral incisional hernia 02/20/2015   Fitting and adjustment of gastric lap band 12/11/2011   ABDOMINAL MASS, RIGHT LOWER QUADRANT 03/17/2007   OBESITY, MORBID 01/22/2007   Essential hypertension 01/22/2007   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 [place] [address] Phone: [phone] Fax: [phone]     Social Determinants of Health (SDOH) Interventions Food Insecurity Interventions: Intervention Not Indicated Financial Strain Interventions: Intervention Not Indicated Housing Interventions: Intervention  Not Indicated Transportation Interventions: Intervention Not Indicated Alcohol Brief Interventions/Follow-up: Alcohol education/Brief advice (HF CSW consulted)  Readmission Risk Interventions No flowsheet data found.

## 2021-09-06 NOTE — Progress Notes (Addendum)
HF TOC CM spoke to pt and states his CPAP is humming, and CPAP with [facility]. [place] to follow up on pt getting new CPAP. Pt reports he received last CPAP in 2018. Attending made aware.  [place], Heart Failure TOC CM [phone]

## 2021-09-06 NOTE — TOC Benefit Eligibility Note (Signed)
Patient Teacher, English as a foreign language completed.    The patient is currently admitted and upon discharge could be taking Entresto 24-26 mg.  The current 30 day co-pay is, $75.00.   The patient is currently admitted and upon discharge could be taking Jardiance 10 mg.  The current 30 day co-pay is, $0.00.   The patient is insured through [place], [place] Patient Colon Patient Advocate Team Direct Number: [phone]  Fax: [phone]

## 2021-09-06 NOTE — Progress Notes (Signed)
Advanced Heart Failure Rounding Note   Subjective:    Diuresed very well. Feels much better. No CP, SOB, orthopnea or PND.   Echo EF 40-45% + LVH  Objective:   Weight Range:  Vital Signs:   Temp:  [97.7 F (36.5 C)-98.8 F (37.1 C)] 97.7 F (36.5 C) (01/20 0736) Pulse Rate:  [52-65] 56 (01/20 1047) Resp:  [20-30] 21 (01/20 1047) BP: (144-171)/(89-103) 159/92 (01/20 1047) SpO2:  [94 %-99 %] 94 % (01/20 1047) Weight:  [148.2 kg] 148.2 kg (01/19 2359) Last BM Date: 09/03/21  Weight change: Filed Weights   09/04/21 1543 09/04/21 1906 09/05/21 2359  Weight: (!) 149.7 kg (!) 149.7 kg (!) 148.2 kg    Intake/Output:   Intake/Output Summary (Last 24 hours) at 09/06/2021 1139 Last data filed at 09/06/2021 1018 Gross per 24 hour  Intake 720 ml  Output 4375 ml  Net -3655 ml     Physical Exam: General:  Well appearing. No resp difficulty HEENT: normal Neck: supple. no JVD. Carotids 2+ bilat; no bruits. No lymphadenopathy or thryomegaly appreciated. Cor: PMI nondisplaced. Regular rate & rhythm. No rubs, gallops or murmurs. Lungs: clear Abdomen: soft, nontender, nondistended. No hepatosplenomegaly. No bruits or masses. Good bowel sounds. Extremities: no cyanosis, clubbing, rash, edema Neuro: alert & orientedx3, cranial nerves grossly intact. moves all 4 extremities w/o difficulty. Affect pleasant   Telemetry: Sinus 50-60 Personally reviewed   Labs: Basic Metabolic Panel: Recent Labs  Lab 09/04/21 1556 09/06/21 0404  NA 140 140  K 3.7 3.7  CL 105 105  CO2 24 28  GLUCOSE 107* 100*  BUN 11 13  CREATININE 0.96 1.25*  CALCIUM 9.5 9.1    Liver Function Tests: Recent Labs  Lab 09/04/21 1556  AST 34  ALT 32  ALKPHOS 94  BILITOT 1.5*  PROT 7.5  ALBUMIN 4.2   No results for input(s): LIPASE, AMYLASE in the last 168 hours. No results for input(s): AMMONIA in the last 168 hours.  CBC: Recent Labs  Lab 09/04/21 1556  WBC 8.0  NEUTROABS 6.1  HGB 14.2   HCT 43.9  MCV 88.5  PLT 215    Cardiac Enzymes: No results for input(s): CKTOTAL, CKMB, CKMBINDEX, TROPONINI in the last 168 hours.  BNP: BNP (last 3 results) Recent Labs    09/04/21 1556  BNP 1,042.7*    ProBNP (last 3 results) No results for input(s): PROBNP in the last 8760 hours.    Other results:  Imaging: DG Chest 2 View  Result Date: 09/04/2021 CLINICAL DATA:  Shortness of breath. EXAM: CHEST - 2 VIEW COMPARISON:  07/23/2020 FINDINGS: The cardiac silhouette remains mildly to moderately enlarged. There is pulmonary vascular congestion with mild interstitial opacity bilaterally. No pleural effusion or pneumothorax is identified. No acute osseous abnormality is seen. IMPRESSION: Cardiomegaly with mild interstitial opacities compatible with pulmonary edema. Electronically Signed   By: Logan Bores M.D.   On: 09/04/2021 16:23   ECHOCARDIOGRAM COMPLETE  Result Date: 09/05/2021    ECHOCARDIOGRAM REPORT   Patient Name:   DONTAY HARM Date of Exam: 09/05/2021 Medical Rec #:  [id number]         Height:       73.0 in Accession #:    [id number]        Weight:       330.0 lb Date of Birth:  05-19-1966         BSA:          2.662 m Patient  Age:    56 years          BP:           171/103 mmHg Patient Gender: M                 HR:           63 bpm. Exam Location:  Inpatient Procedure: 2D Echo, Cardiac Doppler, Color Doppler and Intracardiac            Opacification Agent Indications:    CHF-Acute Diastolic C78.93  History:        Patient has prior history of Echocardiogram examinations, most                 recent 02/22/2017. Risk Factors:Hypertension and Sleep Apnea.                 GERD.  Sonographer:    Darlina Sicilian RDCS Referring Phys: [place]  1. Apical hypokinesis with overall mild LV dysfunction; severe asymmetric LVH; suggest cardiac MRI to R/O HCM.  2. Left ventricular ejection fraction, by estimation, is 40 to 45%. The left ventricle has mildly decreased  function. The left ventricle demonstrates regional wall motion abnormalities (see scoring diagram/findings for description). The left ventricular  internal cavity size was severely dilated. There is severe asymmetric left ventricular hypertrophy. Left ventricular diastolic parameters are consistent with Grade III diastolic dysfunction (restrictive). Elevated left atrial pressure.  3. Right ventricular systolic function is normal. The right ventricular size is normal. There is moderately elevated pulmonary artery systolic pressure.  4. Left atrial size was severely dilated.  5. The mitral valve is normal in structure. Trivial mitral valve regurgitation. No evidence of mitral stenosis.  6. The aortic valve is tricuspid. Aortic valve regurgitation is trivial. Aortic valve sclerosis is present, with no evidence of aortic valve stenosis.  7. Aortic dilatation noted. There is borderline dilatation of the aortic root, measuring 39 mm. There is mild dilatation of the ascending aorta, measuring 41 mm.  8. The inferior vena cava is normal in size with greater than 50% respiratory variability, suggesting right atrial pressure of 3 mmHg. FINDINGS  Left Ventricle: Left ventricular ejection fraction, by estimation, is 40 to 45%. The left ventricle has mildly decreased function. The left ventricle demonstrates regional wall motion abnormalities. Definity contrast agent was given IV to delineate the left ventricular endocardial borders. The left ventricular internal cavity size was severely dilated. There is severe asymmetric left ventricular hypertrophy. Left ventricular diastolic function could not be evaluated due to atrial fibrillation. Left ventricular diastolic parameters are consistent with Grade III diastolic dysfunction (restrictive). Elevated left atrial pressure. Right Ventricle: The right ventricular size is normal. Right ventricular systolic function is normal. There is moderately elevated pulmonary artery systolic  pressure. The tricuspid regurgitant velocity is 3.65 m/s, and with an assumed right atrial pressure of 3 mmHg, the estimated right ventricular systolic pressure is 81.0 mmHg. Left Atrium: Left atrial size was severely dilated. Right Atrium: Right atrial size was normal in size. Pericardium: There is no evidence of pericardial effusion. Mitral Valve: The mitral valve is normal in structure. Trivial mitral valve regurgitation. No evidence of mitral valve stenosis. Tricuspid Valve: The tricuspid valve is normal in structure. Tricuspid valve regurgitation is mild . No evidence of tricuspid stenosis. Aortic Valve: The aortic valve is tricuspid. Aortic valve regurgitation is trivial. Aortic valve sclerosis is present, with no evidence of aortic valve stenosis. Pulmonic Valve: The pulmonic valve was  normal in structure. Pulmonic valve regurgitation is trivial. No evidence of pulmonic stenosis. Aorta: Aortic dilatation noted. There is borderline dilatation of the aortic root, measuring 39 mm. There is mild dilatation of the ascending aorta, measuring 41 mm. Venous: The inferior vena cava is normal in size with greater than 50% respiratory variability, suggesting right atrial pressure of 3 mmHg. IAS/Shunts: No atrial level shunt detected by color flow Doppler. Additional Comments: Apical hypokinesis with overall mild LV dysfunction; severe asymmetric LVH; suggest cardiac MRI to R/O HCM.  LEFT VENTRICLE PLAX 2D LVIDd:         6.35 cm      Diastology LVIDs:         4.60 cm      LV e' medial:    3.75 cm/s LV PW:         1.80 cm      LV E/e' medial:  20.8 LV IVS:        2.43 cm      LV e' lateral:   3.15 cm/s LVOT diam:     2.60 cm      LV E/e' lateral: 24.8 LV SV:         92 LV SV Index:   35 LVOT Area:     5.31 cm  LV Volumes (MOD) LV vol d, MOD A2C: 272.0 ml LV vol d, MOD A4C: 253.0 ml LV vol s, MOD A2C: 169.0 ml LV vol s, MOD A4C: 146.0 ml LV SV MOD A2C:     103.0 ml LV SV MOD A4C:     253.0 ml LV SV MOD BP:      114.8 ml  RIGHT VENTRICLE RV S prime:     12.70 cm/s TAPSE (M-mode): 2.4 cm LEFT ATRIUM              Index        RIGHT ATRIUM           Index LA diam:        5.60 cm  2.10 cm/m   RA Area:     19.30 cm LA Vol (A2C):   83.5 ml  31.37 ml/m  RA Volume:   50.20 ml  18.86 ml/m LA Vol (A4C):   125.0 ml 46.96 ml/m LA Biplane Vol: 106.0 ml 39.82 ml/m  AORTIC VALVE LVOT Vmax:   106.00 cm/s LVOT Vmean:  69.300 cm/s LVOT VTI:    0.174 m  AORTA Ao Root diam: 3.90 cm Ao Asc diam:  4.00 cm MITRAL VALVE               TRICUSPID VALVE MV Area (PHT): 5.27 cm    TR Peak grad:   53.3 mmHg MV Decel Time: 144 msec    TR Vmax:        365.00 cm/s MV E velocity: 78.00 cm/s MV A velocity: 30.80 cm/s  SHUNTS MV E/A ratio:  2.53        Systemic VTI:  0.17 m                            Systemic Diam: 2.60 cm Kirk Ruths MD Electronically signed by Kirk Ruths MD Signature Date/Time: 09/05/2021/2:09:14 PM    Final    VAS [place] LOWER EXTREMITY VENOUS (DVT)  Result Date: 09/05/2021  Lower Venous DVT Study Patient Name:  KHIREE BUKHARI  Date of Exam:   09/05/2021 Medical Rec #: [id number]  Accession #:    [id number] Date of Birth: Mar 03, 1966          Patient Gender: M Patient Age:   74 years Exam Location:  [facility] Procedure:      VAS [place] LOWER EXTREMITY VENOUS (DVT) Referring Phys: AMY CLEGG --------------------------------------------------------------------------------  Indications: Swelling, CHF.  Comparison Study: No prior studies. Performing Technologist: Darlin Coco RDMS, RVT  Examination Guidelines: A complete evaluation includes B-mode imaging, spectral Doppler, color Doppler, and power Doppler as needed of all accessible portions of each vessel. Bilateral testing is considered an integral part of a complete examination. Limited examinations for reoccurring indications may be performed as noted. The reflux portion of the exam is performed with the patient in reverse Trendelenburg.   +---------+---------------+---------+-----------+----------+--------------+  RIGHT     Compressibility Phasicity Spontaneity Properties Thrombus Aging  +---------+---------------+---------+-----------+----------+--------------+  CFV       Full            Yes       Yes                                    +---------+---------------+---------+-----------+----------+--------------+  SFJ       Full                                                             +---------+---------------+---------+-----------+----------+--------------+  FV Prox   Full                                                             +---------+---------------+---------+-----------+----------+--------------+  FV Mid    Full                                                             +---------+---------------+---------+-----------+----------+--------------+  FV Distal Full                                                             +---------+---------------+---------+-----------+----------+--------------+  PFV       Full                                                             +---------+---------------+---------+-----------+----------+--------------+  POP       Full            Yes       Yes                                    +---------+---------------+---------+-----------+----------+--------------+  PTV       Full                                                             +---------+---------------+---------+-----------+----------+--------------+  PERO      Full                                                             +---------+---------------+---------+-----------+----------+--------------+   +---------+---------------+---------+-----------+----------+--------------+  LEFT      Compressibility Phasicity Spontaneity Properties Thrombus Aging  +---------+---------------+---------+-----------+----------+--------------+  CFV       Full            Yes       Yes                                     +---------+---------------+---------+-----------+----------+--------------+  SFJ       Full                                                             +---------+---------------+---------+-----------+----------+--------------+  FV Prox   Full                                                             +---------+---------------+---------+-----------+----------+--------------+  FV Mid    Full                                                             +---------+---------------+---------+-----------+----------+--------------+  FV Distal Full                                                             +---------+---------------+---------+-----------+----------+--------------+  PFV       Full                                                             +---------+---------------+---------+-----------+----------+--------------+  POP       Full            Yes       Yes                                    +---------+---------------+---------+-----------+----------+--------------+  PTV       Full                                                             +---------+---------------+---------+-----------+----------+--------------+  PERO      Full                                                             +---------+---------------+---------+-----------+----------+--------------+     Summary: RIGHT: - There is no evidence of deep vein thrombosis in the lower extremity.  - No cystic structure found in the popliteal fossa.  LEFT: - There is no evidence of deep vein thrombosis in the lower extremity.  - No cystic structure found in the popliteal fossa.  *See table(s) above for measurements and observations. Electronically signed by Orlie Pollen on 09/05/2021 at 7:03:16 PM.    Final      Medications:     Scheduled Medications:  enoxaparin (LOVENOX) injection  60 mg Subcutaneous Q24H   potassium chloride SA  20 mEq Oral BID   sodium chloride flush  3 mL Intravenous Q12H   spironolactone  12.5 mg Oral Daily     Infusions:  sodium chloride      PRN Medications: sodium chloride, acetaminophen, ALPRAZolam, ondansetron (ZOFRAN) IV, sodium chloride flush   Assessment/Plan:     1. 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 Now euvolemic - Will get cMRI to exclude HCM  Home today on:  Spiro 25 mg daily Carvedilol 3.125 bid Entresto 49/51 bid  Lasix 40 daily (down from 40 bid) -> can switch to SGLT2i as outpatient   Stop: Valsartan and Verapamil  2. OSA -Continue CPAP nightly - need weight loss    3.  HTN, severe  - meds as above   4. Obesity  - S/P lap band in 2012 - consider GLP1RA   5. Tobacco/ETOH Abuse - encouraged cessation   As above. Home today after cMRI. F/u HF Clinic.   Length of Stay: 0   Glori Bickers 09/06/2021, 11:39 AM  Advanced Heart Failure Team Pager [phone] (M-F; 7a - 4p)  Please contact [facility] for night-coverage after hours (4p -7a ) and weekends on [email or web address]

## 2021-09-06 NOTE — Progress Notes (Addendum)
PROGRESS NOTE    Chad Lyons  MWN:[id number] DOB: 01/23/1966 DOA: 09/04/2021 PCP: [name], NP  Brief Narrative: 56/M with history of chronic diastolic CHF, sleep apnea, prior lap band surgery, alcohol use, was previously admitted in 4034 with diastolic heart failure exacerbation, had been on Lasix, presented to the ED with progressive shortness of breath and edema for the last few months despite taking oral diuretics, reports approximately 20 to 30 pound weight gain. -In the ED chest x-ray noted pulmonary edema, BNP 1042, creatinine 0.9 -Echo noted EF of 74-25%, grade 3 diastolic dysfunction with some regional wall motion abnormality, severe LVH   Subjective: -Feels much better, breathing improving, not back to baseline, abdominal swelling is much better  Assessment & Plan:   Acute on chronic systolic and diastolic CHF Hypertension -2D echo with EF down to 40-45%, grade 3 DD regional wall motion abnormality and severe LVH -Improving on IV Lasix --6.7 L so far, mild bump in creatinine noted, will hold ARB today -Continue low-dose Aldactone -Cardiology following, plan for cardiac MRI, may need left heart cath -BP remains elevated, anticipate improvement with diuresis  OSA -Given cardiomyopathy, obesity, OSA patient benefit from continuing CPAP -Continue CPAP nightly  Obesity -Status post lap band 10 years ago  Alcohol abuse -Counseled, add thiamine, no withdrawal so far  Tobacco abuse -Counseled  DVT prophylaxis: Lovenox Code Status: Full code Family Communication: Discussed with patient in detail, no family at bedside Disposition Plan: Home pending adequate diuresis, cardiomyopathy work-up Observation, needs to be inpatient due to acute systolic and diastolic CHF requiring IV diuretics, med titration and need for further work-up   Consultants:  Cardiology  Procedures:   Antimicrobials:    Objective: Vitals:   09/05/21 2358 09/05/21 2359 09/06/21 0422  09/06/21 0736  BP: (!) 150/93 (!) 155/91 (!) 152/103 (!) 153/89  Pulse: (!) 58 (!) 57 (!) 52 (!) 53  Resp: 20  (!) 30 (!) 23  Temp: 97.7 F (36.5 C)  97.9 F (36.6 C) 97.7 F (36.5 C)  TempSrc:   Oral Oral  SpO2: 96% 98% 99% 95%  Weight:  (!) 148.2 kg    Height:        Intake/Output Summary (Last 24 hours) at 09/06/2021 1018 Last data filed at 09/06/2021 0825 Gross per 24 hour  Intake 720 ml  Output 3500 ml  Net -2780 ml   Filed Weights   09/04/21 1543 09/04/21 1906 09/05/21 2359  Weight: (!) 149.7 kg (!) 149.7 kg (!) 148.2 kg    Examination:  General exam: Appears calm and comfortable  HEENT: Positive JVD Respiratory system: Few basilar rales, otherwise clear observation, Cardiovascular system: S1 & S2 heard, RRR.  Abd: nondistended, soft and nontender.Normal bowel sounds heard. Central nervous system: Alert and oriented. No focal neurological deficits. Extremities: 1+ edema Skin: No rashes Psychiatry: Judgement and insight appear normal. Mood & affect appropriate.     Data Reviewed:   CBC: Recent Labs  Lab 09/04/21 1556  WBC 8.0  NEUTROABS 6.1  HGB 14.2  HCT 43.9  MCV 88.5  PLT 956   Basic Metabolic Panel: Recent Labs  Lab 09/04/21 1556 09/06/21 0404  NA 140 140  K 3.7 3.7  CL 105 105  CO2 24 28  GLUCOSE 107* 100*  BUN 11 13  CREATININE 0.96 1.25*  CALCIUM 9.5 9.1   GFR: Estimated Creatinine Clearance: 100.1 mL/min (A) (by C-G formula based on SCr of 1.25 mg/dL (H)). Liver Function Tests: Recent Labs  Lab 09/04/21  1556  AST 34  ALT 32  ALKPHOS 94  BILITOT 1.5*  PROT 7.5  ALBUMIN 4.2   No results for input(s): LIPASE, AMYLASE in the last 168 hours. No results for input(s): AMMONIA in the last 168 hours. Coagulation Profile: No results for input(s): INR, PROTIME in the last 168 hours. Cardiac Enzymes: No results for input(s): CKTOTAL, CKMB, CKMBINDEX, TROPONINI in the last 168 hours. BNP (last 3 results) No results for input(s):  PROBNP in the last 8760 hours. HbA1C: No results for input(s): HGBA1C in the last 72 hours. CBG: No results for input(s): GLUCAP in the last 168 hours. Lipid Profile: No results for input(s): CHOL, HDL, LDLCALC, TRIG, CHOLHDL, LDLDIRECT in the last 72 hours. Thyroid Function Tests: No results for input(s): TSH, T4TOTAL, FREET4, T3FREE, THYROIDAB in the last 72 hours. Anemia Panel: No results for input(s): VITAMINB12, FOLATE, FERRITIN, TIBC, IRON, RETICCTPCT in the last 72 hours. Urine analysis:    Component Value Date/Time   COLORURINE STRAW (A) 02/22/2017 1132   APPEARANCEUR CLEAR 02/22/2017 1132   LABSPEC 1.005 02/22/2017 1132   PHURINE 6.0 02/22/2017 1132   GLUCOSEU NEGATIVE 02/22/2017 1132   HGBUR NEGATIVE 02/22/2017 1132   HGBUR negative 07/27/2009 1017   BILIRUBINUR 2+ 09/27/2018 1030   KETONESUR NEGATIVE 02/22/2017 1132   PROTEINUR Positive (A) 09/27/2018 1030   PROTEINUR NEGATIVE 02/22/2017 1132   UROBILINOGEN 2.0 (A) 09/27/2018 1030   UROBILINOGEN 0.2 07/27/2009 1017   NITRITE negative 09/27/2018 1030   NITRITE NEGATIVE 02/22/2017 1132   LEUKOCYTESUR Large (3+) (A) 09/27/2018 1030   Sepsis Labs: @LABRCNTIP (procalcitonin:4,lacticidven:4)  ) Recent Results (from the past 240 hour(s))  Resp Panel by RT-PCR (Flu A&B, Covid) Nasopharyngeal Swab     Status: None   Collection Time: 09/04/21  3:56 PM   Specimen: Nasopharyngeal Swab; Nasopharyngeal(NP) swabs in vial transport medium  Result Value Ref Range Status   SARS Coronavirus 2 by RT PCR NEGATIVE NEGATIVE Final    Comment: (NOTE) SARS-CoV-2 target nucleic acids are NOT DETECTED.  The SARS-CoV-2 RNA is generally detectable in upper respiratory specimens during the acute phase of infection. The lowest concentration of SARS-CoV-2 viral copies this assay can detect is 138 copies/mL. A negative result does not preclude SARS-Cov-2 infection and should not be used as the sole basis for treatment or other patient  management decisions. A negative result may occur with  improper specimen collection/handling, submission of specimen other than nasopharyngeal swab, presence of viral mutation(s) within the areas targeted by this assay, and inadequate number of viral copies(<138 copies/mL). A negative result must be combined with clinical observations, patient history, and epidemiological information. The expected result is Negative.  Fact Sheet for Patients:  EntrepreneurPulse.com.au  Fact Sheet for Healthcare Providers:  IncredibleEmployment.be  This test is no t yet approved or cleared by the [place] FDA and  has been authorized for detection and/or diagnosis of SARS-CoV-2 by FDA under an Emergency Use Authorization (EUA). This EUA will remain  in effect (meaning this test can be used) for the duration of the COVID-19 declaration under Section 564(b)(1) of the Act, 21 U.S.C.section 360bbb-3(b)(1), unless the authorization is terminated  or revoked sooner.       Influenza A by PCR NEGATIVE NEGATIVE Final   Influenza B by PCR NEGATIVE NEGATIVE Final    Comment: (NOTE) The Xpert Xpress SARS-CoV-2/FLU/RSV plus assay is intended as an aid in the diagnosis of influenza from Nasopharyngeal swab specimens and should not be used as a sole basis for treatment.  Nasal washings and aspirates are unacceptable for Xpert Xpress SARS-CoV-2/FLU/RSV testing.  Fact Sheet for Patients: EntrepreneurPulse.com.au  Fact Sheet for Healthcare Providers: IncredibleEmployment.be  This test is not yet approved or cleared by the [place] FDA and has been authorized for detection and/or diagnosis of SARS-CoV-2 by FDA under an Emergency Use Authorization (EUA). This EUA will remain in effect (meaning this test can be used) for the duration of the COVID-19 declaration under Section 564(b)(1) of the Act, 21 U.S.C. section 360bbb-3(b)(1),  unless the authorization is terminated or revoked.  Performed at [facility], [address], [place], [place] [address]      Radiology Studies: DG Chest 2 View  Result Date: 09/04/2021 CLINICAL DATA:  Shortness of breath. EXAM: CHEST - 2 VIEW COMPARISON:  07/23/2020 FINDINGS: The cardiac silhouette remains mildly to moderately enlarged. There is pulmonary vascular congestion with mild interstitial opacity bilaterally. No pleural effusion or pneumothorax is identified. No acute osseous abnormality is seen. IMPRESSION: Cardiomegaly with mild interstitial opacities compatible with pulmonary edema. Electronically Signed   By: Logan Bores M.D.   On: 09/04/2021 16:23   ECHOCARDIOGRAM COMPLETE  Result Date: 09/05/2021    ECHOCARDIOGRAM REPORT   Patient Name:   Chad Lyons Date of Exam: 09/05/2021 Medical Rec #:  [id number]         Height:       73.0 in Accession #:    [id number]        Weight:       330.0 lb Date of Birth:  May 30, 1966         BSA:          2.662 m Patient Age:    14 years          BP:           171/103 mmHg Patient Gender: M                 HR:           63 bpm. Exam Location:  Inpatient Procedure: 2D Echo, Cardiac Doppler, Color Doppler and Intracardiac            Opacification Agent Indications:    CHF-Acute Diastolic F75.10  History:        Patient has prior history of Echocardiogram examinations, most                 recent 02/22/2017. Risk Factors:Hypertension and Sleep Apnea.                 GERD.  Sonographer:    Darlina Sicilian RDCS Referring Phys: [place]  1. Apical hypokinesis with overall mild LV dysfunction; severe asymmetric LVH; suggest cardiac MRI to R/O HCM.  2. Left ventricular ejection fraction, by estimation, is 40 to 45%. The left ventricle has mildly decreased function. The left ventricle demonstrates regional wall motion abnormalities (see scoring diagram/findings for description). The left ventricular  internal cavity  size was severely dilated. There is severe asymmetric left ventricular hypertrophy. Left ventricular diastolic parameters are consistent with Grade III diastolic dysfunction (restrictive). Elevated left atrial pressure.  3. Right ventricular systolic function is normal. The right ventricular size is normal. There is moderately elevated pulmonary artery systolic pressure.  4. Left atrial size was severely dilated.  5. The mitral valve is normal in structure. Trivial mitral valve regurgitation. No evidence of mitral stenosis.  6. The aortic valve is tricuspid. Aortic valve regurgitation is trivial. Aortic valve sclerosis is  present, with no evidence of aortic valve stenosis.  7. Aortic dilatation noted. There is borderline dilatation of the aortic root, measuring 39 mm. There is mild dilatation of the ascending aorta, measuring 41 mm.  8. The inferior vena cava is normal in size with greater than 50% respiratory variability, suggesting right atrial pressure of 3 mmHg. FINDINGS  Left Ventricle: Left ventricular ejection fraction, by estimation, is 40 to 45%. The left ventricle has mildly decreased function. The left ventricle demonstrates regional wall motion abnormalities. Definity contrast agent was given IV to delineate the left ventricular endocardial borders. The left ventricular internal cavity size was severely dilated. There is severe asymmetric left ventricular hypertrophy. Left ventricular diastolic function could not be evaluated due to atrial fibrillation. Left ventricular diastolic parameters are consistent with Grade III diastolic dysfunction (restrictive). Elevated left atrial pressure. Right Ventricle: The right ventricular size is normal. Right ventricular systolic function is normal. There is moderately elevated pulmonary artery systolic pressure. The tricuspid regurgitant velocity is 3.65 m/s, and with an assumed right atrial pressure of 3 mmHg, the estimated right ventricular systolic pressure is  37.1 mmHg. Left Atrium: Left atrial size was severely dilated. Right Atrium: Right atrial size was normal in size. Pericardium: There is no evidence of pericardial effusion. Mitral Valve: The mitral valve is normal in structure. Trivial mitral valve regurgitation. No evidence of mitral valve stenosis. Tricuspid Valve: The tricuspid valve is normal in structure. Tricuspid valve regurgitation is mild . No evidence of tricuspid stenosis. Aortic Valve: The aortic valve is tricuspid. Aortic valve regurgitation is trivial. Aortic valve sclerosis is present, with no evidence of aortic valve stenosis. Pulmonic Valve: The pulmonic valve was normal in structure. Pulmonic valve regurgitation is trivial. No evidence of pulmonic stenosis. Aorta: Aortic dilatation noted. There is borderline dilatation of the aortic root, measuring 39 mm. There is mild dilatation of the ascending aorta, measuring 41 mm. Venous: The inferior vena cava is normal in size with greater than 50% respiratory variability, suggesting right atrial pressure of 3 mmHg. IAS/Shunts: No atrial level shunt detected by color flow Doppler. Additional Comments: Apical hypokinesis with overall mild LV dysfunction; severe asymmetric LVH; suggest cardiac MRI to R/O HCM.  LEFT VENTRICLE PLAX 2D LVIDd:         6.35 cm      Diastology LVIDs:         4.60 cm      LV e' medial:    3.75 cm/s LV PW:         1.80 cm      LV E/e' medial:  20.8 LV IVS:        2.43 cm      LV e' lateral:   3.15 cm/s LVOT diam:     2.60 cm      LV E/e' lateral: 24.8 LV SV:         92 LV SV Index:   35 LVOT Area:     5.31 cm  LV Volumes (MOD) LV vol d, MOD A2C: 272.0 ml LV vol d, MOD A4C: 253.0 ml LV vol s, MOD A2C: 169.0 ml LV vol s, MOD A4C: 146.0 ml LV SV MOD A2C:     103.0 ml LV SV MOD A4C:     253.0 ml LV SV MOD BP:      114.8 ml RIGHT VENTRICLE RV S prime:     12.70 cm/s TAPSE (M-mode): 2.4 cm LEFT ATRIUM  Index        RIGHT ATRIUM           Index LA diam:        5.60 cm  2.10  cm/m   RA Area:     19.30 cm LA Vol (A2C):   83.5 ml  31.37 ml/m  RA Volume:   50.20 ml  18.86 ml/m LA Vol (A4C):   125.0 ml 46.96 ml/m LA Biplane Vol: 106.0 ml 39.82 ml/m  AORTIC VALVE LVOT Vmax:   106.00 cm/s LVOT Vmean:  69.300 cm/s LVOT VTI:    0.174 m  AORTA Ao Root diam: 3.90 cm Ao Asc diam:  4.00 cm MITRAL VALVE               TRICUSPID VALVE MV Area (PHT): 5.27 cm    TR Peak grad:   53.3 mmHg MV Decel Time: 144 msec    TR Vmax:        365.00 cm/s MV E velocity: 78.00 cm/s MV A velocity: 30.80 cm/s  SHUNTS MV E/A ratio:  2.53        Systemic VTI:  0.17 m                            Systemic Diam: 2.60 cm Kirk Ruths MD Electronically signed by Kirk Ruths MD Signature Date/Time: 09/05/2021/2:09:14 PM    Final    VAS [place] LOWER EXTREMITY VENOUS (DVT)  Result Date: 09/05/2021  Lower Venous DVT Study Patient Name:  PIER BOSHER  Date of Exam:   09/05/2021 Medical Rec #: [id number]          Accession #:    [id number] Date of Birth: 01-22-1966          Patient Gender: M Patient Age:   57 years Exam Location:  [facility] Procedure:      VAS [place] LOWER EXTREMITY VENOUS (DVT) Referring Phys: AMY CLEGG --------------------------------------------------------------------------------  Indications: Swelling, CHF.  Comparison Study: No prior studies. Performing Technologist: Darlin Coco RDMS, RVT  Examination Guidelines: A complete evaluation includes B-mode imaging, spectral Doppler, color Doppler, and power Doppler as needed of all accessible portions of each vessel. Bilateral testing is considered an integral part of a complete examination. Limited examinations for reoccurring indications may be performed as noted. The reflux portion of the exam is performed with the patient in reverse Trendelenburg.  +---------+---------------+---------+-----------+----------+--------------+  RIGHT     Compressibility Phasicity Spontaneity Properties Thrombus Aging   +---------+---------------+---------+-----------+----------+--------------+  CFV       Full            Yes       Yes                                    +---------+---------------+---------+-----------+----------+--------------+  SFJ       Full                                                             +---------+---------------+---------+-----------+----------+--------------+  FV Prox   Full                                                             +---------+---------------+---------+-----------+----------+--------------+  FV Mid    Full                                                             +---------+---------------+---------+-----------+----------+--------------+  FV Distal Full                                                             +---------+---------------+---------+-----------+----------+--------------+  PFV       Full                                                             +---------+---------------+---------+-----------+----------+--------------+  POP       Full            Yes       Yes                                    +---------+---------------+---------+-----------+----------+--------------+  PTV       Full                                                             +---------+---------------+---------+-----------+----------+--------------+  PERO      Full                                                             +---------+---------------+---------+-----------+----------+--------------+   +---------+---------------+---------+-----------+----------+--------------+  LEFT      Compressibility Phasicity Spontaneity Properties Thrombus Aging  +---------+---------------+---------+-----------+----------+--------------+  CFV       Full            Yes       Yes                                    +---------+---------------+---------+-----------+----------+--------------+  SFJ       Full                                                              +---------+---------------+---------+-----------+----------+--------------+  FV Prox   Full                                                             +---------+---------------+---------+-----------+----------+--------------+  FV Mid    Full                                                             +---------+---------------+---------+-----------+----------+--------------+  FV Distal Full                                                             +---------+---------------+---------+-----------+----------+--------------+  PFV       Full                                                             +---------+---------------+---------+-----------+----------+--------------+  POP       Full            Yes       Yes                                    +---------+---------------+---------+-----------+----------+--------------+  PTV       Full                                                             +---------+---------------+---------+-----------+----------+--------------+  PERO      Full                                                             +---------+---------------+---------+-----------+----------+--------------+     Summary: RIGHT: - There is no evidence of deep vein thrombosis in the lower extremity.  - No cystic structure found in the popliteal fossa.  LEFT: - There is no evidence of deep vein thrombosis in the lower extremity.  - No cystic structure found in the popliteal fossa.  *See table(s) above for measurements and observations. Electronically signed by Orlie Pollen on 09/05/2021 at 7:03:16 PM.    Final      Scheduled Meds:  enoxaparin (LOVENOX) injection  60 mg Subcutaneous Q24H   furosemide  40 mg Intravenous BID   potassium chloride SA  20 mEq Oral BID   sodium chloride flush  3 mL Intravenous Q12H   spironolactone  12.5 mg Oral Daily   Continuous Infusions:  sodium chloride       LOS: 0 days    Time spent: 69min    Domenic Polite, MD [facility]   09/06/2021,  10:18 AM

## 2021-09-07 DIAGNOSIS — I5043 Acute on chronic combined systolic (congestive) and diastolic (congestive) heart failure: Secondary | ICD-10-CM

## 2021-09-07 LAB — BASIC METABOLIC PANEL
Anion gap: 6 (ref 5–15)
BUN: 12 mg/dL (ref 6–20)
CO2: 26 mmol/L (ref 22–32)
Calcium: 8.9 mg/dL (ref 8.9–10.3)
Chloride: 105 mmol/L (ref 98–111)
Creatinine, Ser: 1.02 mg/dL (ref 0.61–1.24)
GFR, Estimated: >60 mL/min (ref >60)
Glucose, Bld: 100 mg/dL — ABNORMAL HIGH (ref 70–99)
Potassium: 3.6 mmol/L (ref 3.5–5.1)
Sodium: 137 mmol/L (ref 135–145)

## 2021-09-07 NOTE — Discharge Summary (Signed)
Physician Discharge Summary  Chad Lyons TDV:[id number] DOB: 08/15/1966 DOA: 09/04/2021  PCP: Chad Peng, NP  Admit date: 09/04/2021 Discharge date: 09/07/2021  Time spent: 33minutes  Recommendations for Outpatient Follow-up:  [facility] heart failure clinic on 1/27 Please check BMP at follow-up Follow-up cardiac MRI scan, titrate GDMT   Discharge Diagnoses:   Acute on chronic systolic and diastolic CHF Hypertension Obstructive sleep apnea Obesity Prior lap band surgery Alcohol use Tobacco abuse   Discharge Condition: Stable  Diet recommendation: Low-sodium, heart healthy  Filed Weights   09/04/21 1906 09/05/21 2359 09/07/21 0538  Weight: (!) 149.7 kg (!) 148.2 kg (!) 146.1 kg    History of present illness:  55/M with history of chronic diastolic CHF, sleep apnea, prior lap band surgery, alcohol use, was previously admitted in 0626 with diastolic heart failure exacerbation, had been on Lasix, presented to the ED with progressive shortness of breath and edema for the last few months despite taking oral diuretics, reports approximately 20 to 30 pound weight gain. -In the ED chest x-ray noted pulmonary edema, BNP 1042, creatinine 0.9 -Echo noted EF of 94-85%, grade 3 diastolic dysfunction with some regional wall motion abnormality, severe LVH    Hospital Course:   Acute on chronic systolic and diastolic CHF Hypertension -2D echo with EF down to 40-45%, grade 3 DD regional wall motion abnormality and severe LVH -Diuresed with IV Lasix with good response he is 9 L negative so far -Clinically close to euvolemic now transitioned to Lasix 40 Mg daily along with Entresto 49/51, spironolactone 25 mg daily, carvedilol 3.125 mg twice daily -Consider SGLT2 inhibitor as outpatient -He had a cardiac MRI completed last night to rule out HCM, results of this are still pending, cleared by cardiology for discharge home, close follow-up of cardiac MRI, may need left heart cath as  well -BMP in 1 week   OSA -Given cardiomyopathy, obesity, OSA patient benefit from continuing CPAP -Continue CPAP nightly   Obesity -Status post lap band 10 years ago   Alcohol abuse -Counseled, thiamine added   Tobacco abuse -Counseled  Procedures:  Cardiac MRI-results pending  ECHO IMPRESSIONS  1. Apical hypokinesis with overall mild LV dysfunction; severe asymmetric LVH; suggest cardiac MRI to R/O HCM.  2. Left ventricular ejection fraction, by estimation, is 40 to 45%. The left ventricle has mildly decreased function. The left ventricle demonstrates regional wall motion abnormalities (see scoring diagram/findings for description). The left ventricular  internal cavity size was severely dilated. There is severe asymmetric left ventricular hypertrophy. Left ventricular diastolic parameters are consistent with Grade III diastolic dysfunction (restrictive). Elevated left atrial pressure.  3. Right ventricular systolic function is normal. The right ventricular size is normal. There is moderately elevated pulmonary artery systolic pressure.  4. Left atrial size was severely dilated.  5. The mitral valve is normal in structure. Trivial mitral valve regurgitation. No evidence of mitral stenosis.  6. The aortic valve is tricuspid. Aortic valve regurgitation is trivial. Aortic valve sclerosis is present, with no evidence of aortic valve stenosis.  7. Aortic dilatation noted. There is borderline dilatation of the aortic root, measuring 39 mm. There is mild dilatation of the ascending aorta, measuring 41 mm.  8. The inferior vena cava is normal in size with greater than 50% respiratory variability, suggesting right atrial pressure of 3 mmHg.   Consultations: Cardiology  Discharge Exam: Vitals:   09/07/21 0538 09/07/21 0714  BP: (!) 148/92 (!) 147/89  Pulse: (!) 54 (!) 53  Resp:  20 20  Temp: 98.1 F (36.7 C) 98.3 F (36.8 C)  SpO2: 97% 93%      Discharge Instructions   Discharge  Instructions     Diet - low sodium heart healthy   Complete by: As directed    Increase activity slowly   Complete by: As directed       Allergies as of 09/07/2021       Reactions   Hydromet [hydrocodone Bit-homatrop Mbr] Itching   Iodine-131 Itching   Iohexol Hives, Itching   PT DEVELOPS ITCHING AND HIVES SEVERAL HOURS AFTER INJECTION OF OMNIPAQUE   Penicillins Itching   Has patient had a PCN reaction causing immediate rash, facial/tongue/throat swelling, SOB or lightheadedness with hypotension: Yes Has patient had a PCN reaction causing severe rash involving mucus membranes or skin necrosis: No Has patient had a PCN reaction that required hospitalization: No Has patient had a PCN reaction occurring within the last 10 years: No If all of the above answers are "NO", then may proceed with Cephalosporin use.   Red Dye Itching        Medication List     STOP taking these medications    valsartan 80 MG tablet Commonly known as: DIOVAN   verapamil 240 MG CR tablet Commonly known as: CALAN-SR       TAKE these medications    acetaminophen 500 MG tablet Commonly known as: TYLENOL Take 1,000 mg by mouth every 6 (six) hours as needed for headache (pain).   ALPRAZolam 0.5 MG tablet Commonly known as: XANAX Take 1 tablet (0.5 mg total) by mouth daily as needed. What changed: reasons to take this   benzonatate 200 MG capsule Commonly known as: TESSALON Take 1 capsule (200 mg total) by mouth 2 (two) times daily as needed for cough.   carvedilol 3.125 MG tablet Commonly known as: COREG Take 1 tablet (3.125 mg total) by mouth 2 (two) times daily with a meal.   Entresto 49-51 MG Generic drug: sacubitril-valsartan Take 1 tablet by mouth 2 (two) times daily.   fluticasone 50 MCG/ACT nasal spray Commonly known as: FLONASE Place 2 sprays into both nostrils daily. What changed:  when to take this reasons to take this   furosemide 40 MG tablet Commonly known as:  LASIX Take 1 tablet (40 mg total) by mouth daily. What changed: when to take this   multivitamin with minerals Tabs tablet Take 1 tablet by mouth every morning.   polyvinyl alcohol 1.4 % ophthalmic solution Commonly known as: LIQUIFILM TEARS Place 1 drop into both eyes every morning. Rohto eye drops for dry eyes   potassium chloride SA 20 MEQ tablet Commonly known as: KLOR-CON M TAKE 1 TABLET BY MOUTH DAILY What changed:  how to take this when to take this   spironolactone 25 MG tablet Commonly known as: ALDACTONE Take 1/2 tablet (12.5 mg total) by mouth daily.   thiamine 100 MG tablet Commonly known as: Vitamin B-1 Take 1 tablet (100 mg total) by mouth daily. What changed: when to take this   vitamin C 250 MG tablet Commonly known as: ASCORBIC ACID Take 250 mg by mouth every morning.               Durable Medical Equipment  (From admission, onward)           Start     Ordered   09/06/21 0950  For home use only DME continuous positive airway pressure (CPAP)  Once  Comments: 5-20 cmH2O and supplies Patient has been using and benefiting from CPAP nightly  Question Answer Comment  Length of Need Lifetime   Patient has OSA or probable OSA Yes   Is the patient currently using CPAP in the home Yes   Date of face to face encounter 09/06/2021   Settings Autotitration   Signs and symptoms of probable OSA  (select all that apply) Snoring   CPAP supplies needed Mask, headgear, cushions, filters, heated tubing and water chamber      09/06/21 0958           Allergies  Allergen Reactions   Hydromet [Hydrocodone Bit-Homatrop Mbr] Itching   Iodine-131 Itching   Iohexol Hives and Itching    PT DEVELOPS ITCHING AND HIVES SEVERAL HOURS AFTER INJECTION OF OMNIPAQUE    Penicillins Itching    Has patient had a PCN reaction causing immediate rash, facial/tongue/throat swelling, SOB or lightheadedness with hypotension: Yes Has patient had a PCN reaction causing  severe rash involving mucus membranes or skin necrosis: No Has patient had a PCN reaction that required hospitalization: No Has patient had a PCN reaction occurring within the last 10 years: No If all of the above answers are "NO", then may proceed with Cephalosporin use.    Red Dye Itching    Follow-up Information     [facility] Follow up on 09/13/2021.   Specialty: Cardiology Why: Advanced Heart Failure Clinic at [facility] 2:30 pm Entrance C, Garage Code 1202 Contact information: [address] [phone] [facility] Follow up.   Why: please follow up with [employer] about CPAP Contact information: [phone]                 The results of significant diagnostics from this hospitalization (including imaging, microbiology, ancillary and laboratory) are listed below for reference.    Significant Diagnostic Studies: DG Chest 2 View  Result Date: 09/04/2021 CLINICAL DATA:  Shortness of breath. EXAM: CHEST - 2 VIEW COMPARISON:  07/23/2020 FINDINGS: The cardiac silhouette remains mildly to moderately enlarged. There is pulmonary vascular congestion with mild interstitial opacity bilaterally. No pleural effusion or pneumothorax is identified. No acute osseous abnormality is seen. IMPRESSION: Cardiomegaly with mild interstitial opacities compatible with pulmonary edema. Electronically Signed   By: Logan Bores M.D.   On: 09/04/2021 16:23   ECHOCARDIOGRAM COMPLETE  Result Date: 09/05/2021    ECHOCARDIOGRAM REPORT   Patient Name:   CARTHEL CASTILLE Date of Exam: 09/05/2021 Medical Rec #:  [id number]         Height:       73.0 in Accession #:    [id number]        Weight:       330.0 lb Date of Birth:  Sep 03, 1965         BSA:          2.662 m Patient Age:    31 years          BP:           171/103 mmHg Patient Gender: M                 HR:           63  bpm. Exam Location:  Inpatient Procedure: 2D Echo, Cardiac Doppler, Color Doppler and Intracardiac  Opacification Agent Indications:    CHF-Acute Diastolic I43.32  History:        Patient has prior history of Echocardiogram examinations, most                 recent 02/22/2017. Risk Factors:Hypertension and Sleep Apnea.                 GERD.  Sonographer:    Darlina Sicilian RDCS Referring Phys: [place]  1. Apical hypokinesis with overall mild LV dysfunction; severe asymmetric LVH; suggest cardiac MRI to R/O HCM.  2. Left ventricular ejection fraction, by estimation, is 40 to 45%. The left ventricle has mildly decreased function. The left ventricle demonstrates regional wall motion abnormalities (see scoring diagram/findings for description). The left ventricular  internal cavity size was severely dilated. There is severe asymmetric left ventricular hypertrophy. Left ventricular diastolic parameters are consistent with Grade III diastolic dysfunction (restrictive). Elevated left atrial pressure.  3. Right ventricular systolic function is normal. The right ventricular size is normal. There is moderately elevated pulmonary artery systolic pressure.  4. Left atrial size was severely dilated.  5. The mitral valve is normal in structure. Trivial mitral valve regurgitation. No evidence of mitral stenosis.  6. The aortic valve is tricuspid. Aortic valve regurgitation is trivial. Aortic valve sclerosis is present, with no evidence of aortic valve stenosis.  7. Aortic dilatation noted. There is borderline dilatation of the aortic root, measuring 39 mm. There is mild dilatation of the ascending aorta, measuring 41 mm.  8. The inferior vena cava is normal in size with greater than 50% respiratory variability, suggesting right atrial pressure of 3 mmHg. FINDINGS  Left Ventricle: Left ventricular ejection fraction, by estimation, is 40 to 45%. The left ventricle has mildly decreased function. The  left ventricle demonstrates regional wall motion abnormalities. Definity contrast agent was given IV to delineate the left ventricular endocardial borders. The left ventricular internal cavity size was severely dilated. There is severe asymmetric left ventricular hypertrophy. Left ventricular diastolic function could not be evaluated due to atrial fibrillation. Left ventricular diastolic parameters are consistent with Grade III diastolic dysfunction (restrictive). Elevated left atrial pressure. Right Ventricle: The right ventricular size is normal. Right ventricular systolic function is normal. There is moderately elevated pulmonary artery systolic pressure. The tricuspid regurgitant velocity is 3.65 m/s, and with an assumed right atrial pressure of 3 mmHg, the estimated right ventricular systolic pressure is 95.1 mmHg. Left Atrium: Left atrial size was severely dilated. Right Atrium: Right atrial size was normal in size. Pericardium: There is no evidence of pericardial effusion. Mitral Valve: The mitral valve is normal in structure. Trivial mitral valve regurgitation. No evidence of mitral valve stenosis. Tricuspid Valve: The tricuspid valve is normal in structure. Tricuspid valve regurgitation is mild . No evidence of tricuspid stenosis. Aortic Valve: The aortic valve is tricuspid. Aortic valve regurgitation is trivial. Aortic valve sclerosis is present, with no evidence of aortic valve stenosis. Pulmonic Valve: The pulmonic valve was normal in structure. Pulmonic valve regurgitation is trivial. No evidence of pulmonic stenosis. Aorta: Aortic dilatation noted. There is borderline dilatation of the aortic root, measuring 39 mm. There is mild dilatation of the ascending aorta, measuring 41 mm. Venous: The inferior vena cava is normal in size with greater than 50% respiratory variability, suggesting right atrial pressure of 3 mmHg. IAS/Shunts: No atrial level shunt detected by color flow Doppler. Additional Comments:  Apical hypokinesis with overall mild LV dysfunction; severe asymmetric LVH; suggest  cardiac MRI to R/O HCM.  LEFT VENTRICLE PLAX 2D LVIDd:         6.35 cm      Diastology LVIDs:         4.60 cm      LV e' medial:    3.75 cm/s LV PW:         1.80 cm      LV E/e' medial:  20.8 LV IVS:        2.43 cm      LV e' lateral:   3.15 cm/s LVOT diam:     2.60 cm      LV E/e' lateral: 24.8 LV SV:         92 LV SV Index:   35 LVOT Area:     5.31 cm  LV Volumes (MOD) LV vol d, MOD A2C: 272.0 ml LV vol d, MOD A4C: 253.0 ml LV vol s, MOD A2C: 169.0 ml LV vol s, MOD A4C: 146.0 ml LV SV MOD A2C:     103.0 ml LV SV MOD A4C:     253.0 ml LV SV MOD BP:      114.8 ml RIGHT VENTRICLE RV S prime:     12.70 cm/s TAPSE (M-mode): 2.4 cm LEFT ATRIUM              Index        RIGHT ATRIUM           Index LA diam:        5.60 cm  2.10 cm/m   RA Area:     19.30 cm LA Vol (A2C):   83.5 ml  31.37 ml/m  RA Volume:   50.20 ml  18.86 ml/m LA Vol (A4C):   125.0 ml 46.96 ml/m LA Biplane Vol: 106.0 ml 39.82 ml/m  AORTIC VALVE LVOT Vmax:   106.00 cm/s LVOT Vmean:  69.300 cm/s LVOT VTI:    0.174 m  AORTA Ao Root diam: 3.90 cm Ao Asc diam:  4.00 cm MITRAL VALVE               TRICUSPID VALVE MV Area (PHT): 5.27 cm    TR Peak grad:   53.3 mmHg MV Decel Time: 144 msec    TR Vmax:        365.00 cm/s MV E velocity: 78.00 cm/s MV A velocity: 30.80 cm/s  SHUNTS MV E/A ratio:  2.53        Systemic VTI:  0.17 m                            Systemic Diam: 2.60 cm Kirk Ruths MD Electronically signed by Kirk Ruths MD Signature Date/Time: 09/05/2021/2:09:14 PM    Final    VAS [place] LOWER EXTREMITY VENOUS (DVT)  Result Date: 09/05/2021  Lower Venous DVT Study Patient Name:  LEVII HAIRFIELD  Date of Exam:   09/05/2021 Medical Rec #: [id number]          Accession #:    [id number] Date of Birth: 12/31/1965          Patient Gender: M Patient Age:   55 years Exam Location:  [facility] Procedure:      VAS [place] LOWER EXTREMITY VENOUS (DVT) Referring Phys:  AMY CLEGG --------------------------------------------------------------------------------  Indications: Swelling, CHF.  Comparison Study: No prior studies. Performing Technologist: Darlin Coco RDMS, RVT  Examination Guidelines: A complete evaluation includes B-mode imaging, spectral Doppler, color Doppler,  and power Doppler as needed of all accessible portions of each vessel. Bilateral testing is considered an integral part of a complete examination. Limited examinations for reoccurring indications may be performed as noted. The reflux portion of the exam is performed with the patient in reverse Trendelenburg.  +---------+---------------+---------+-----------+----------+--------------+  RIGHT     Compressibility Phasicity Spontaneity Properties Thrombus Aging  +---------+---------------+---------+-----------+----------+--------------+  CFV       Full            Yes       Yes                                    +---------+---------------+---------+-----------+----------+--------------+  SFJ       Full                                                             +---------+---------------+---------+-----------+----------+--------------+  FV Prox   Full                                                             +---------+---------------+---------+-----------+----------+--------------+  FV Mid    Full                                                             +---------+---------------+---------+-----------+----------+--------------+  FV Distal Full                                                             +---------+---------------+---------+-----------+----------+--------------+  PFV       Full                                                             +---------+---------------+---------+-----------+----------+--------------+  POP       Full            Yes       Yes                                    +---------+---------------+---------+-----------+----------+--------------+  PTV       Full                                                              +---------+---------------+---------+-----------+----------+--------------+  PERO      Full                                                             +---------+---------------+---------+-----------+----------+--------------+   +---------+---------------+---------+-----------+----------+--------------+  LEFT      Compressibility Phasicity Spontaneity Properties Thrombus Aging  +---------+---------------+---------+-----------+----------+--------------+  CFV       Full            Yes       Yes                                    +---------+---------------+---------+-----------+----------+--------------+  SFJ       Full                                                             +---------+---------------+---------+-----------+----------+--------------+  FV Prox   Full                                                             +---------+---------------+---------+-----------+----------+--------------+  FV Mid    Full                                                             +---------+---------------+---------+-----------+----------+--------------+  FV Distal Full                                                             +---------+---------------+---------+-----------+----------+--------------+  PFV       Full                                                             +---------+---------------+---------+-----------+----------+--------------+  POP       Full            Yes       Yes                                    +---------+---------------+---------+-----------+----------+--------------+  PTV       Full                                                             +---------+---------------+---------+-----------+----------+--------------+  PERO      Full                                                             +---------+---------------+---------+-----------+----------+--------------+     Summary: RIGHT: - There is no evidence of deep vein thrombosis in the lower  extremity.  - No cystic structure found in the popliteal fossa.  LEFT: - There is no evidence of deep vein thrombosis in the lower extremity.  - No cystic structure found in the popliteal fossa.  *See table(s) above for measurements and observations. Electronically signed by Orlie Pollen on 09/05/2021 at 7:03:16 PM.    Final     Microbiology: Recent Results (from the past 240 hour(s))  Resp Panel by RT-PCR (Flu A&B, Covid) Nasopharyngeal Swab     Status: None   Collection Time: 09/04/21  3:56 PM   Specimen: Nasopharyngeal Swab; Nasopharyngeal(NP) swabs in vial transport medium  Result Value Ref Range Status   SARS Coronavirus 2 by RT PCR NEGATIVE NEGATIVE Final    Comment: (NOTE) SARS-CoV-2 target nucleic acids are NOT DETECTED.  The SARS-CoV-2 RNA is generally detectable in upper respiratory specimens during the acute phase of infection. The lowest concentration of SARS-CoV-2 viral copies this assay can detect is 138 copies/mL. A negative result does not preclude SARS-Cov-2 infection and should not be used as the sole basis for treatment or other patient management decisions. A negative result may occur with  improper specimen collection/handling, submission of specimen other than nasopharyngeal swab, presence of viral mutation(s) within the areas targeted by this assay, and inadequate number of viral copies(<138 copies/mL). A negative result must be combined with clinical observations, patient history, and epidemiological information. The expected result is Negative.  Fact Sheet for Patients:  EntrepreneurPulse.com.au  Fact Sheet for Healthcare Providers:  IncredibleEmployment.be  This test is no t yet approved or cleared by the [place] FDA and  has been authorized for detection and/or diagnosis of SARS-CoV-2 by FDA under an Emergency Use Authorization (EUA). This EUA will remain  in effect (meaning this test can be used) for the duration  of the COVID-19 declaration under Section 564(b)(1) of the Act, 21 U.S.C.section 360bbb-3(b)(1), unless the authorization is terminated  or revoked sooner.       Influenza A by PCR NEGATIVE NEGATIVE Final   Influenza B by PCR NEGATIVE NEGATIVE Final    Comment: (NOTE) The Xpert Xpress SARS-CoV-2/FLU/RSV plus assay is intended as an aid in the diagnosis of influenza from Nasopharyngeal swab specimens and should not be used as a sole basis for treatment. Nasal washings and aspirates are unacceptable for Xpert Xpress SARS-CoV-2/FLU/RSV testing.  Fact Sheet for Patients: EntrepreneurPulse.com.au  Fact Sheet for Healthcare Providers: IncredibleEmployment.be  This test is not yet approved or cleared by the [place] FDA and has been authorized for detection and/or diagnosis of SARS-CoV-2 by FDA under an Emergency Use Authorization (EUA). This EUA will remain in effect (meaning this test can be used) for the duration of the COVID-19 declaration under Section 564(b)(1) of the Act, 21 U.S.C. section 360bbb-3(b)(1), unless the authorization is terminated or revoked.  Performed at [facility], [address], [place], [place] [address]      Labs: Basic Metabolic Panel: Recent Labs  Lab 09/04/21 1556 09/06/21 0404 09/07/21 9326  NA 140 140 137  K 3.7 3.7 3.6  CL 105 105 105  CO2 24 28 26   GLUCOSE 107* 100* 100*  BUN 11 13 12   CREATININE 0.96 1.25* 1.02  CALCIUM 9.5 9.1 8.9   Liver Function Tests: Recent Labs  Lab 09/04/21 1556  AST 34  ALT 32  ALKPHOS 94  BILITOT 1.5*  PROT 7.5  ALBUMIN 4.2   No results for input(s): LIPASE, AMYLASE in the last 168 hours. No results for input(s): AMMONIA in the last 168 hours. CBC: Recent Labs  Lab 09/04/21 1556  WBC 8.0  NEUTROABS 6.1  HGB 14.2  HCT 43.9  MCV 88.5  PLT 215   Cardiac Enzymes: No results for input(s): CKTOTAL, CKMB, CKMBINDEX, TROPONINI in the  last 168 hours. BNP: BNP (last 3 results) Recent Labs    09/04/21 1556  BNP 1,042.7*    ProBNP (last 3 results) No results for input(s): PROBNP in the last 8760 hours.  CBG: No results for input(s): GLUCAP in the last 168 hours.     Signed:  Domenic Polite MD.  [facility] 09/07/2021, 1:21 PM

## 2021-09-07 NOTE — Plan of Care (Signed)

## 2021-09-07 NOTE — TOC Progression Note (Signed)
Transition of Care [facility]) - Progression Note    Patient Details  Name: Chad Lyons MRN: [id number] Date of Birth: February 26, 1966  Transition of Care [facility]) CM/SW Contact  Zenon Mayo, RN Phone Number: 09/07/2021, 9:49 AM  Clinical Narrative:    NCM spoke with patient regarding cpap, he states previous NCM said [employer] will call him, he has not gotten a call yet, NCM contacted [place] with [employer], he gave him the patient's cell phone, number he will give it to the rep at [employer] to contact patient , Thedore Mins states they have everything they need  for now. There is nothing for this NCM to do regarding the cpap.  Patient is for dc today.    Expected Discharge Plan: Home/Self Care Barriers to Discharge: Continued Medical Work up  Expected Discharge Plan and Services Expected Discharge Plan: Home/Self Care   Discharge Planning Services: CM Consult   Living arrangements for the past 2 months: Single Family Home Expected Discharge Date: 09/07/21               DME Arranged: Continuous positive airway pressure (CPAP) DME Agency: [employer] Date DME Agency Contacted: 09/06/21 Time DME Agency Contacted: [phone]               Social Determinants of Health (SDOH) Interventions Food Insecurity Interventions: Intervention Not Indicated Financial Strain Interventions: Intervention Not Indicated Housing Interventions: Intervention Not Indicated Transportation Interventions: Intervention Not Indicated Alcohol Brief Interventions/Follow-up: Alcohol education/Brief advice (HF CSW consulted)  Readmission Risk Interventions No flowsheet data found.

## 2021-09-07 NOTE — TOC Progression Note (Addendum)
Transition of Care [facility]) - Progression Note    Patient Details  Name: Chad Lyons MRN: [id number] Date of Birth: 25-Mar-1966  Transition of Care [facility]) CM/SW Contact  Zenon Mayo, RN Phone Number: 09/07/2021, 9:54 AM  Clinical Narrative:    NCM spoke with patient regarding cpap, he states previous NCM said [employer] will call him, he has not gotten a call yet, NCM contacted [place] with [employer], he gave him the patient's cell phone, number he will give it to the rep at [employer] to contact patient , Thedore Mins states they have everything they need  for now. There is nothing for this NCM to do regarding the cpap.  Patient is for dc today.  NCM gave patient 30 day coupon for entresto, and 10.00 co pay card for entresto.      Expected Discharge Plan: Home/Self Care Barriers to Discharge: Continued Medical Work up  Expected Discharge Plan and Services Expected Discharge Plan: Home/Self Care   Discharge Planning Services: CM Consult   Living arrangements for the past 2 months: Single Family Home Expected Discharge Date: 09/07/21               DME Arranged: Continuous positive airway pressure (CPAP) DME Agency: [employer] Date DME Agency Contacted: 09/06/21 Time DME Agency Contacted: [phone]               Social Determinants of Health (SDOH) Interventions Food Insecurity Interventions: Intervention Not Indicated Financial Strain Interventions: Intervention Not Indicated Housing Interventions: Intervention Not Indicated Transportation Interventions: Intervention Not Indicated Alcohol Brief Interventions/Follow-up: Alcohol education/Brief advice (HF CSW consulted)  Readmission Risk Interventions No flowsheet data found.

## 2021-09-07 NOTE — Progress Notes (Signed)
Cardiology plan of care note: IT issues from 09/06/21 have not yet resolved, leading to delays in CMR reporting.  Key findings from CMR: LVEF 35% Septal hypokinesis and late gadolinium enhancement Septal thickness of 24 mm Myocardial mass index 116 g/m2.  Study is consistent with hypertrophic cardiomyopathy.   Full study to be uploaded when IT issues have resolved.   Rudean Haskell MD

## 2021-09-09 ENCOUNTER — Telehealth: Payer: Self-pay

## 2021-09-09 ENCOUNTER — Telehealth: Payer: Self-pay | Admitting: *Deleted

## 2021-09-09 MED ORDER — PERFLUTREN LIPID MICROSPHERE
1.0000 mL | INTRAVENOUS | Status: AC | PRN
  Administered 2021-09-05: 2 mL via INTRAVENOUS

## 2021-09-09 NOTE — Telephone Encounter (Signed)
Transition Care Management Follow-up Telephone Call Date of discharge and from where: [place] 09-07-21 Dx: acute CHF exac  How have you been since you were released from the hospital? Feeling a lot better  Any questions or concerns? No  Items Reviewed: Did the pt receive and understand the discharge instructions provided? Yes  Medications obtained and verified? Yes  Other? No  Any new allergies since your discharge? No  Dietary orders reviewed? Yes Do you have support at home? Yes   Home Care and Equipment/Supplies: Were home health services ordered? no If so, what is the name of the agency?na  Has the agency set up a time to come to the patient's home? not applicable Were any new equipment or medical supplies ordered?  No What is the name of the medical supply agency? [facility]  Were you able to get the supplies/equipment? not applicable Do you have any questions related to the use of the equipment or supplies? No  Functional Questionnaire: (I = Independent and D = Dependent) ADLs: I  Bathing/Dressing- I  Meal Prep- I  Eating- I  Maintaining continence- I  Transferring/Ambulation- I  Managing Meds- I  Follow up appointments reviewed:  PCP Hospital f/u appt confirmed? Yes  Scheduled to see [name]  on 09-12-21 @ 930am. [facility] f/u appt confirmed? Yes  Scheduled to see Dr Bjorn Loser on 09-13-21 @ 230pm. Are transportation arrangements needed? No  If their condition worsens, is the pt aware to call PCP or go to the Emergency Dept.? Yes Was the patient provided with contact information for the PCP's office or ED? Yes Was to pt encouraged to call back with questions or concerns? Yes

## 2021-09-12 ENCOUNTER — Ambulatory Visit (INDEPENDENT_AMBULATORY_CARE_PROVIDER_SITE_OTHER): Payer: 59 | Admitting: Adult Health

## 2021-09-12 ENCOUNTER — Encounter: Payer: Self-pay | Admitting: Adult Health

## 2021-09-12 VITALS — BP 132/80 | HR 95 | Temp 98.2°F | Wt 324.0 lb

## 2021-09-12 DIAGNOSIS — I421 Obstructive hypertrophic cardiomyopathy: Secondary | ICD-10-CM | POA: Diagnosis not present

## 2021-09-12 DIAGNOSIS — E668 Other obesity: Secondary | ICD-10-CM

## 2021-09-12 DIAGNOSIS — G4733 Obstructive sleep apnea (adult) (pediatric): Secondary | ICD-10-CM

## 2021-09-12 DIAGNOSIS — I10 Essential (primary) hypertension: Secondary | ICD-10-CM | POA: Diagnosis not present

## 2021-09-12 DIAGNOSIS — I5043 Acute on chronic combined systolic (congestive) and diastolic (congestive) heart failure: Secondary | ICD-10-CM | POA: Diagnosis not present

## 2021-09-12 DIAGNOSIS — F1010 Alcohol abuse, uncomplicated: Secondary | ICD-10-CM

## 2021-09-12 DIAGNOSIS — Z720 Tobacco use: Secondary | ICD-10-CM

## 2021-09-12 LAB — CBC WITH DIFFERENTIAL/PLATELET
Basophils Absolute: 0.1 10*3/uL (ref 0.0–0.1)
Basophils Relative: 0.8 % (ref 0.0–3.0)
Eosinophils Absolute: 0.1 10*3/uL (ref 0.0–0.7)
Eosinophils Relative: 1.6 % (ref 0.0–5.0)
HCT: 51.5 % (ref 39.0–52.0)
Hemoglobin: 16.4 g/dL (ref 13.0–17.0)
Lymphocytes Relative: 19.3 % (ref 12.0–46.0)
Lymphs Abs: 1.4 10*3/uL (ref 0.7–4.0)
MCHC: 31.9 g/dL (ref 30.0–36.0)
MCV: 90.4 fl (ref 78.0–100.0)
Monocytes Absolute: 0.8 10*3/uL (ref 0.1–1.0)
Monocytes Relative: 11.3 % (ref 3.0–12.0)
Neutro Abs: 5 10*3/uL (ref 1.4–7.7)
Neutrophils Relative %: 67 % (ref 43.0–77.0)
Platelets: 255 10*3/uL (ref 150.0–400.0)
RBC: 5.7 Mil/uL (ref 4.22–5.81)
RDW: 15.8 % — ABNORMAL HIGH (ref 11.5–15.5)
WBC: 7.5 10*3/uL (ref 4.0–10.5)

## 2021-09-12 LAB — COMPREHENSIVE METABOLIC PANEL
ALT: 34 U/L (ref 0–53)
AST: 43 U/L — ABNORMAL HIGH (ref 0–37)
Albumin: 4.3 g/dL (ref 3.5–5.2)
Alkaline Phosphatase: 78 U/L (ref 39–117)
BUN: 20 mg/dL (ref 6–23)
CO2: 26 mEq/L (ref 19–32)
Calcium: 10.3 mg/dL (ref 8.4–10.5)
Chloride: 101 mEq/L (ref 96–112)
Creatinine, Ser: 1.2 mg/dL (ref 0.40–1.50)
GFR: 67.63 mL/min (ref >60.00)
Glucose, Bld: 101 mg/dL — ABNORMAL HIGH (ref 70–99)
Potassium: 5.2 mEq/L — ABNORMAL HIGH (ref 3.5–5.1)
Sodium: 138 mEq/L (ref 135–145)
Total Bilirubin: 0.7 mg/dL (ref 0.2–1.2)
Total Protein: 8 g/dL (ref 6.0–8.3)

## 2021-09-12 MED ORDER — VARENICLINE TARTRATE 1 MG PO TABS: 1.0000 mg | ORAL_TABLET | Freq: Two times a day (BID) | ORAL | 3 refills | Status: AC

## 2021-09-12 MED ORDER — VARENICLINE TARTRATE 0.5 MG X 11 & 1 MG X 42 PO TBPK: ORAL_TABLET | ORAL | 0 refills | Status: DC

## 2021-09-12 NOTE — Progress Notes (Signed)
Subjective:    Patient ID: Chad Lyons, male    DOB: 18-Jun-1966, 56 y.o.   MRN: [id number]  HPI 56 year old male who  has a past medical history of Diverticulitis, EKG, abnormal, GERD (gastroesophageal reflux disease), Heart murmur, Hypertension, OSA (obstructive sleep apnea) (11/27/2016), and Ventral incisional hernia.  He presents to the office today for TCM visit   Admit Date 09/04/2021 Discharge Date 09/07/2021  He presented to the emergency room with progressive shortness of breath and edema for the previous few months despite taking oral diuretics.  Reported approximately 20 to 30 pound weight gain.  In the ED his chest x-ray noted pulmonary edema, BNP of 1042, creatinine 0.9.  Echo noted EF of 40 to 15%, grade 3 diastolic dysfunction with some regional wall motion abnormalities, severe LVH.  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   -He was diuresed with IV Lasix with good response, 9 L negative upon discharge.  Clinically close to euvolemic was transitioned to Lasix 40 mg daily along with Entresto 49-51 mg, spironolactone 25 mg daily, and carvedilol 3.125 mg twice daily. -Consider SGLT 2 inhibitor as an outpatient -He had a cardiac MRI completed to rule out HCM which showed a study consistent with hypertrophic cardiomyopathy - BMP in 1 week  OSA  -Continue CPAP nightly  Obesity -Status post lap band 10 years ago  Alcohol abuse -Thiamine was added  Tobacco abuse -Counseled to quit.  Today he reports that he is feeling much better since originally being seen in the emergency room.  Breathing is back to baseline, his weight has been stable and is actually lost a couple extra pounds, when he weighed himself this morning his scale was 324 pounds.  He has cut back on his alcohol consumption and is trying to eat a heart  healthy diet.  He has follow-up appointment with cardiology tomorrow  As for the smoking, he reports that he gave up smoking cigars but over the last week and a half has moved to smoking cigarettes.  He reports a pack will last him approximately 3 days.  Has not had any chest pain or lower extremity edema.  He feels as though his symptoms likely started at the end of the summer and just progress.  Feels as though a lot of this is related to work stress, on healthy eating, alcohol consumption.  He plans on making 2023 the year that he starts taking better care of himself.  Review of Systems  Constitutional: Negative.   Eyes: Negative.   Respiratory: Negative.    Cardiovascular: Negative.   Gastrointestinal: Negative.   Endocrine: Negative.   Genitourinary: Negative.   Musculoskeletal: Negative.   Skin: Negative.   Allergic/Immunologic: Negative.   Neurological: Negative.   Hematological: Negative.   Psychiatric/Behavioral: Negative.    All other systems reviewed and are negative.  Past Medical History:  Diagnosis Date   Diverticulitis    HX OF   EKG, abnormal    HX OF LEFT ANTERIO FASCICULAR BLOCK ON 02-15-15 EKG   GERD (gastroesophageal reflux disease)    Heart murmur    NOW RESOLVED   Hypertension    OSA (obstructive sleep apnea) 11/27/2016   Ventral incisional hernia     Social History   Socioeconomic History   Marital status: Married    Spouse name: Ishmail Mcmanamon  Number of children: 1   Years of education: Not on file   Highest education level: Bachelor's degree (e.g., BA, AB, BS)  Occupational History   Occupation: Sales  Tobacco Use   Smoking status: Every Day    Packs/day: 0.50    Types: Cigars, Cigarettes   Smokeless tobacco: Never   Tobacco comments:    Current Cigars 1 every other day -----QUIT cigarettes in 2006>> 1/2PPD x 15 years  Vaping Use   Vaping Use: Never used  Substance and Sexual Activity   Alcohol use: Yes    Alcohol/week: 40.0 standard  drinks    Types: 40 Shots of liquor per week    Comment: 6-7 shots of liquor daily x 8 years. heavier on weekend   Drug use: No   Sexual activity: Not on file    Comment: still smokes cigars  Other Topics Concern   Not on file  Social History Narrative   Not on file   Social Determinants of Health   Financial Resource Strain: Low Risk    Difficulty of Paying Living Expenses: Not hard at all  Food Insecurity: No Food Insecurity   Worried About Charity fundraiser in the Last Year: Never true   [place] in the Last Year: Never true  Transportation Needs: No Transportation Needs   Lack of Transportation (Medical): No   Lack of Transportation (Non-Medical): No  Physical Activity: Sufficiently Active   Days of Exercise per Week: 5 days   Minutes of Exercise per Session: 30 min  Stress: Stress Concern Present   Feeling of Stress : To some extent  Social Connections: Engineer, building services of Communication with Friends and Family: More than three times a week   Frequency of Social Gatherings with Friends and Family: Once a week   Attends Religious Services: More than 4 times per year   Active Member of Clubs or Organizations: Yes   Attends Archivist Meetings: More than 4 times per year   Marital Status: Married  Human resources officer Violence: Not on file    Past Surgical History:  Procedure Laterality Date   COLON SURGERY  2006   colon resection for polyps   COLONOSCOPY WITH PROPOFOL N/A 10/31/2016   Procedure: COLONOSCOPY WITH PROPOFOL;  Surgeon: Mauri Pole, MD;  Location: [facility];  Service: Endoscopy;  Laterality: N/A;   HIATAL HERNIA REPAIR  1991   INSERTION OF MESH N/A 02/20/2015   Procedure: INSERTION OF MESH;  Surgeon: Excell Seltzer, MD;  Location: [facility];  Service: General;  Laterality: N/A;   LAPAROSCOPIC GASTRIC BANDING  2011   VENTRAL HERNIA REPAIR N/A 02/20/2015   Procedure: OPEN REPAIR VENTRAL INCISIONAL HERNIA;  Surgeon: Excell Seltzer, MD;  Location: [facility];  Service: General;  Laterality: N/A;   WISDOM TOOTH EXTRACTION      Family History  Problem Relation Age of Onset   Heart disease Mother    High blood pressure Mother    Diabetes Mother    Heart disease Maternal Aunt    Cancer Maternal Uncle        pancreatic   Heart disease Maternal Uncle    Rheum arthritis Maternal Grandmother    High blood pressure Maternal Grandmother    Colon cancer Neg Hx     Allergies  Allergen Reactions   Hydromet [Hydrocodone Bit-Homatrop Mbr] Itching   Iodine-131 Itching   Iohexol Hives and Itching    PT DEVELOPS ITCHING AND HIVES SEVERAL HOURS  AFTER INJECTION OF OMNIPAQUE    Penicillins Itching    Has patient had a PCN reaction causing immediate rash, facial/tongue/throat swelling, SOB or lightheadedness with hypotension: Yes Has patient had a PCN reaction causing severe rash involving mucus membranes or skin necrosis: No Has patient had a PCN reaction that required hospitalization: No Has patient had a PCN reaction occurring within the last 10 years: No If all of the above answers are "NO", then may proceed with Cephalosporin use.    Red Dye Itching    Current Outpatient Medications on File Prior to Visit  Medication Sig Dispense Refill   acetaminophen (TYLENOL) 500 MG tablet Take 1,000 mg by mouth every 6 (six) hours as needed for headache (pain).     ALPRAZolam (XANAX) 0.5 MG tablet Take 1 tablet (0.5 mg total) by mouth daily as needed. (Patient taking differently: Take 0.5 mg by mouth daily as needed for anxiety (stress).) 90 tablet 0   benzonatate (TESSALON) 200 MG capsule Take 1 capsule (200 mg total) by mouth 2 (two) times daily as needed for cough. 20 capsule 0   carvedilol (COREG) 3.125 MG tablet Take 1 tablet (3.125 mg total) by mouth 2 (two) times daily with a meal. 60 tablet 0   fluticasone (FLONASE) 50 MCG/ACT nasal spray Place 2 sprays into both nostrils daily. (Patient taking differently: Place 2  sprays into both nostrils daily as needed for allergies or rhinitis.) 48 g 1   furosemide (LASIX) 40 MG tablet Take 1 tablet (40 mg total) by mouth daily.     Multiple Vitamin (MULTIVITAMIN WITH MINERALS) TABS tablet Take 1 tablet by mouth every morning.     polyvinyl alcohol (LIQUIFILM TEARS) 1.4 % ophthalmic solution Place 1 drop into both eyes every morning. [place]o eye drops for dry eyes     potassium chloride SA (KLOR-CON) 20 MEQ tablet TAKE 1 TABLET BY MOUTH DAILY (Patient taking differently: 20 mEq every morning.) 90 tablet 1   sacubitril-valsartan (ENTRESTO) 49-51 MG Take 1 tablet by mouth 2 (two) times daily. 60 tablet 0   spironolactone (ALDACTONE) 25 MG tablet Take 1/2 tablet (12.5 mg total) by mouth daily. 30 tablet 0   thiamine (VITAMIN B-1) 100 MG tablet Take 1 tablet (100 mg total) by mouth daily. (Patient taking differently: Take 100 mg by mouth every morning.) 90 tablet 1   triamcinolone cream (KENALOG) 0.1 % Apply 1 application topically 2 (two) times daily.     vitamin C (ASCORBIC ACID) 250 MG tablet Take 250 mg by mouth every morning.     No current facility-administered medications on file prior to visit.    BP 132/80 (BP Location: Left Arm, Patient Position: Sitting, Cuff Size: Normal)    Pulse 95    Temp 98.2 F (36.8 C) (Oral)    Wt (!) 324 lb (147 kg) Comment: at home   SpO2 95%    BMI 42.75 kg/m       Objective:   Physical Exam Vitals and nursing note reviewed.  Constitutional:      Appearance: Normal appearance.  Cardiovascular:     Rate and Rhythm: Normal rate and regular rhythm.     Pulses: Normal pulses.     Heart sounds: Normal heart sounds.  Pulmonary:     Effort: Pulmonary effort is normal.     Breath sounds: Normal breath sounds.  Musculoskeletal:        General: Normal range of motion.     Right lower leg: No edema.  Left lower leg: No edema.  Skin:    General: Skin is warm and dry.     Capillary Refill: Capillary refill takes less than 2  seconds.  Neurological:     General: No focal deficit present.     Mental Status: He is alert and oriented to person, place, and time.  Psychiatric:        Mood and Affect: Mood normal.        Behavior: Behavior normal.        Thought Content: Thought content normal.        Judgment: Judgment normal.      Assessment & Plan:  1. Acute on chronic combined systolic and diastolic congestive heart failure ([place]) - Continue with Cardiology plan of care  - CBC with Differential/Platelet; Future - Comprehensive metabolic panel; Future - Comprehensive metabolic panel - CBC with Differential/Platelet  2. Essential hypertension - Near goal  - Continue with current medications  - CBC with Differential/Platelet; Future - Comprehensive metabolic panel; Future - Comprehensive metabolic panel - CBC with Differential/Platelet  3. Hypertrophic obstructive cardiomyopathy (HCC)  - CBC with Differential/Platelet; Future - Comprehensive metabolic panel; Future - Comprehensive metabolic panel - CBC with Differential/Platelet  4. OSA (obstructive sleep apnea) - Continue with CPAP 5. Alcohol abuse - Counseling given, advised refraining from alcohol   6. Tobacco abuse - he would like to start chantix. Needs to quit tobacco products completely.  - varenicline (CHANTIX CONTINUING MONTH PAK) 1 MG tablet; Take 1 tablet (1 mg total) by mouth 2 (two) times daily.  Dispense: 60 tablet; Refill: 3 - varenicline (CHANTIX PAK) 0.5 MG X 11 & 1 MG X 42 tablet; Take one 0.5 mg tablet by mouth once daily for 3 days, then increase to one 0.5 mg tablet twice daily for 4 days, then increase to one 1 mg tablet twice daily.  Dispense: 53 tablet; Refill: 0  7. Other obesity - Continue with low salt, heart healthy diet - Encouraged structured exercise routine   [name], NP

## 2021-09-13 ENCOUNTER — Other Ambulatory Visit (HOSPITAL_COMMUNITY): Payer: Self-pay

## 2021-09-13 ENCOUNTER — Ambulatory Visit (HOSPITAL_COMMUNITY)
Admit: 2021-09-13 | Discharge: 2021-09-13 | Disposition: A | Discharge status: Home / Self Care | Payer: 59 | Attending: Family Medicine | Admitting: Family Medicine

## 2021-09-13 ENCOUNTER — Other Ambulatory Visit: Payer: Self-pay

## 2021-09-13 ENCOUNTER — Encounter (HOSPITAL_COMMUNITY): Payer: Self-pay

## 2021-09-13 ENCOUNTER — Encounter (HOSPITAL_COMMUNITY): Payer: 59

## 2021-09-13 VITALS — BP 128/80 | HR 85 | Wt 327.8 lb

## 2021-09-13 DIAGNOSIS — F1010 Alcohol abuse, uncomplicated: Secondary | ICD-10-CM | POA: Insufficient documentation

## 2021-09-13 DIAGNOSIS — G4733 Obstructive sleep apnea (adult) (pediatric): Secondary | ICD-10-CM | POA: Diagnosis not present

## 2021-09-13 DIAGNOSIS — Z79899 Other long term (current) drug therapy: Secondary | ICD-10-CM | POA: Diagnosis not present

## 2021-09-13 DIAGNOSIS — I482 Chronic atrial fibrillation, unspecified: Secondary | ICD-10-CM | POA: Insufficient documentation

## 2021-09-13 DIAGNOSIS — Z9989 Dependence on other enabling machines and devices: Secondary | ICD-10-CM | POA: Insufficient documentation

## 2021-09-13 DIAGNOSIS — I4891 Unspecified atrial fibrillation: Secondary | ICD-10-CM

## 2021-09-13 DIAGNOSIS — I10 Essential (primary) hypertension: Secondary | ICD-10-CM | POA: Diagnosis not present

## 2021-09-13 DIAGNOSIS — Z9884 Bariatric surgery status: Secondary | ICD-10-CM | POA: Diagnosis not present

## 2021-09-13 DIAGNOSIS — E669 Obesity, unspecified: Secondary | ICD-10-CM | POA: Insufficient documentation

## 2021-09-13 DIAGNOSIS — F17290 Nicotine dependence, other tobacco product, uncomplicated: Secondary | ICD-10-CM | POA: Diagnosis not present

## 2021-09-13 DIAGNOSIS — I422 Other hypertrophic cardiomyopathy: Secondary | ICD-10-CM | POA: Insufficient documentation

## 2021-09-13 DIAGNOSIS — I110 Hypertensive heart disease with heart failure: Secondary | ICD-10-CM | POA: Diagnosis not present

## 2021-09-13 DIAGNOSIS — Z720 Tobacco use: Secondary | ICD-10-CM

## 2021-09-13 DIAGNOSIS — Z8249 Family history of ischemic heart disease and other diseases of the circulatory system: Secondary | ICD-10-CM | POA: Insufficient documentation

## 2021-09-13 DIAGNOSIS — K5792 Diverticulitis of intestine, part unspecified, without perforation or abscess without bleeding: Secondary | ICD-10-CM | POA: Diagnosis not present

## 2021-09-13 DIAGNOSIS — F419 Anxiety disorder, unspecified: Secondary | ICD-10-CM | POA: Insufficient documentation

## 2021-09-13 DIAGNOSIS — Z7901 Long term (current) use of anticoagulants: Secondary | ICD-10-CM | POA: Insufficient documentation

## 2021-09-13 DIAGNOSIS — Z6841 Body Mass Index (BMI) 40.0 and over, adult: Secondary | ICD-10-CM | POA: Diagnosis not present

## 2021-09-13 DIAGNOSIS — I5032 Chronic diastolic (congestive) heart failure: Secondary | ICD-10-CM | POA: Diagnosis not present

## 2021-09-13 DIAGNOSIS — R002 Palpitations: Secondary | ICD-10-CM | POA: Insufficient documentation

## 2021-09-13 LAB — BASIC METABOLIC PANEL
Anion gap: 12 (ref 5–15)
BUN: 17 mg/dL (ref 6–20)
CO2: 25 mmol/L (ref 22–32)
Calcium: 9.3 mg/dL (ref 8.9–10.3)
Chloride: 101 mmol/L (ref 98–111)
Creatinine, Ser: 1.27 mg/dL — ABNORMAL HIGH (ref 0.61–1.24)
GFR, Estimated: >60 mL/min (ref >60)
Glucose, Bld: 92 mg/dL (ref 70–99)
Potassium: 4.9 mmol/L (ref 3.5–5.1)
Sodium: 138 mmol/L (ref 135–145)

## 2021-09-13 LAB — BRAIN NATRIURETIC PEPTIDE: B Natriuretic Peptide: 1337.5 pg/mL — ABNORMAL HIGH (ref 0.0–100.0)

## 2021-09-13 LAB — TSH: TSH: 1.307 u[IU]/mL (ref 0.350–4.500)

## 2021-09-13 MED ORDER — APIXABAN 5 MG PO TABS: 5.0000 mg | ORAL_TABLET | Freq: Two times a day (BID) | ORAL | 6 refills | Status: DC

## 2021-09-13 MED ORDER — FUROSEMIDE 40 MG PO TABS: 80.0000 mg | ORAL_TABLET | Freq: Two times a day (BID) | ORAL | 3 refills | Status: DC

## 2021-09-13 MED ORDER — POTASSIUM CHLORIDE CRYS ER 20 MEQ PO TBCR: 20.0000 meq | EXTENDED_RELEASE_TABLET | Freq: Two times a day (BID) | ORAL | 1 refills | Status: DC

## 2021-09-13 MED ORDER — AMIODARONE HCL 200 MG PO TABS: 200.0000 mg | ORAL_TABLET | Freq: Every day | ORAL | 2 refills | Status: DC

## 2021-09-13 NOTE — Addendum Note (Signed)
Encounter addended by: Rafael Bihari, FNP on: 09/13/2021 8:03 PM  Actions taken: Clinical Note Signed

## 2021-09-13 NOTE — Patient Instructions (Signed)
Medication Changes:  Start Amiodarone 200mg  Twice daily  Start Eliquis 5mg  Twice daily  Increase lasix to 80mg  Twice daily  Increase Potassium to 20 Meq (1 TAB) Twice daily   Lab Work:  Labs done today, your results will be available in MyChart, we will contact you for abnormal readings.   Testing/Procedures:  You are scheduled for a TEE/Cardioversion/TEE Cardioversion on February 16th 2023 with Dr. Pierre Bali.  Please arrive at the [facility] (Main Entrance A) at [facility]: [address] [place],  [address] at 6.30 am. (1 hour prior to procedure unless lab work is needed; if lab work is needed arrive 1.5 hours ahead)  DIET: Nothing to eat or drink after midnight except a sip of water with medications (see medication instructions below)  FYI: For your safety, and to allow [place] to monitor your vital signs accurately during the surgery/procedure we request that   if you have artificial nails, gel coating, SNS etc. Please have those removed prior to your surgery/procedure. Not having the nail coverings /polish removed may result in cancellation or delay of your surgery/procedure.   Medication Instructions: Hold Lasix and Spironolactone morning of procedure  Continue your anticoagulant: Eliquis You will need to continue your anticoagulant after your procedure until you  are told by your  Provider that it is safe to stop   Labs: completed   You must have a responsible person to drive you home and stay in the waiting area during your procedure. Failure to do so could result in cancellation.  Bring your insurance cards.  *Special Note: Every effort is made to have your procedure done on time. Occasionally there are emergencies that occur at the hospital that may cause delays. Please be patient if a delay does occur.    Referrals:  none  Special Instructions // Education:  none  Follow-Up in: 2 weeks after Cardioversion  At the [facility], you and your health needs are our priority. We have a designated team specialized in the treatment of Heart Failure. This Care Team includes your primary Heart Failure Specialized Cardiologist (physician), Advanced Practice Providers (APPs- Physician Assistants and Nurse Practitioners), and Pharmacist who all work together to provide you with the care you need, when you need it.   You may see any of the following providers on your designated Care Team at your next follow up:  Dr Glori Bickers Dr Haynes Kerns, NP Lyda Jester, [place] [facility] [place], [place] Audry Riles, PharmD   Please be sure to bring in all your medications bottles to every appointment.   Need to Contact us:  If you have any questions or concerns before your next appointment please send [place] a message through [employer] or call our office at [phone].    TO LEAVE A MESSAGE FOR THE NURSE SELECT OPTION 2, PLEASE LEAVE A MESSAGE INCLUDING: YOUR NAME DATE OF BIRTH CALL BACK NUMBER REASON FOR CALL**this is important as we prioritize the call backs  YOU WILL RECEIVE A CALL BACK THE SAME DAY AS LONG AS YOU CALL BEFORE 4:00 PM

## 2021-09-13 NOTE — H&P (View-Only) (Signed)
ADVANCED HF CLINIC CONSULT NOTE   Primary Care: [name], NP HF Cardiologist: Dr. Haroldine Laws  HPI: [name] is a 56 y.o.with history of HFpEF, diverticulitis, HTN, S/P Lap Band, tobacco abuse, and OSA (uses CPAP), and new diagnosis of hypertrophic cardiomyopathy.   Echo in 2018 EF 50-55%.    Admitted 1/23 with a/c CHF. He was diuresed with IV lasix. Echo showed EF 40-45%, +LVH (Dr. Haroldine Laws read as 50%). cMRI showed LVEF 35%, moderate asymmetric septal hypertrophy, no LVOT, RVEF 42%, findings consistent with hypertrophic cardiomyopathy. He was discharged on GDMT with spiro, Lasix, carvedilol and Entresto; discharge weight 325 lbs.   Today he returns for post hospital HF follow up with wife. Overall feeling fine. No SOB with walking, carrying groceries or going up stairs. Noticed palpitations this past week, felt heart pounding. Denies CP, dizziness, edema, or PND/Orthopnea. Appetite ok. No fever or chills. Weight at home 324 pounds. Taking all medications. Smoking 3-4 cigs/day. Wearing CPAP nightly. Drinking ETOH 1/2 gallon vodka/week (3-4 mixed drinks a night).  FH: mother has Afib, brother has MI and AF  - Echo (1/23): EF 40-45%, + LVH - cMRI (1/23): LVEF 35%, RVEF 42%, study consistent w HCM.  Review of Systems: [y] = yes, [ ]  = no   General: Weight gain [ ] ; Weight loss [ ] ; Anorexia [ ] ; Fatigue [ ] ; Fever [ ] ; Chills [ ] ; Weakness [ ]   Cardiac: Chest pain/pressure [ ] ; Resting SOB [ ] ; Exertional SOB [ ] ; Orthopnea [ ] ; Pedal Edema [ ] ; Palpitations Blue.Reese ]; Syncope [ ] ; Presyncope [ ] ; Paroxysmal nocturnal dyspnea[ ]   Pulmonary: Cough [ ] ; Wheezing[ ] ; Hemoptysis[ ] ; Sputum [ ] ; Snoring [ ]   GI: Vomiting[ ] ; Dysphagia[ ] ; Melena[ ] ; Hematochezia [ ] ; Heartburn[ ] ; Abdominal pain [ ] ; Constipation [ ] ; Diarrhea [ ] ; BRBPR [ ]   GU: Hematuria[ ] ; Dysuria [ ] ; Nocturia[ ]   Vascular: Pain in legs with walking [ ] ; Pain in feet with lying flat [ ] ; Non-healing sores [ ] ; Stroke [  ]; TIA [ ] ; Slurred speech [ ] ;  Neuro: Headaches[ ] ; Vertigo[ ] ; Seizures[ ] ; Paresthesias[ ] ;Blurred vision [ ] ; Diplopia [ ] ; Vision changes [ ]   Ortho/Skin: Arthritis [ ] ; Joint pain [ ] ; Muscle pain [ ] ; Joint swelling [ ] ; Back Pain [ ] ; Rash [ ]   Psych: Depression[ ] ; Anxiety[ ]   Heme: Bleeding problems [ ] ; Clotting disorders [ ] ; Anemia [ ]   Endocrine: Diabetes [ ] ; Thyroid dysfunction[ ]   Past Medical History:  Diagnosis Date   Diverticulitis    HX OF   EKG, abnormal    HX OF LEFT ANTERIO FASCICULAR BLOCK ON 02-15-15 EKG   GERD (gastroesophageal reflux disease)    Heart murmur    NOW RESOLVED   Hypertension    OSA (obstructive sleep apnea) 11/27/2016   Ventral incisional hernia    Current Outpatient Medications  Medication Sig Dispense Refill   acetaminophen (TYLENOL) 500 MG tablet Take 1,000 mg by mouth every 6 (six) hours as needed for headache (pain).     ALPRAZolam (XANAX) 0.5 MG tablet Take 1 tablet (0.5 mg total) by mouth daily as needed. 90 tablet 0   benzonatate (TESSALON) 200 MG capsule Take 1 capsule (200 mg total) by mouth 2 (two) times daily as needed for cough. 20 capsule 0   carvedilol (COREG) 3.125 MG tablet Take 1 tablet (3.125 mg total) by mouth 2 (two) times daily with a meal. 60 tablet 0  fluticasone (FLONASE) 50 MCG/ACT nasal spray Place 2 sprays into both nostrils daily. 48 g 1   furosemide (LASIX) 40 MG tablet Take 1 tablet (40 mg total) by mouth daily.     Multiple Vitamin (MULTIVITAMIN WITH MINERALS) TABS tablet Take 1 tablet by mouth every morning.     polyvinyl alcohol (LIQUIFILM TEARS) 1.4 % ophthalmic solution Place 1 drop into both eyes every morning. Rohto eye drops for dry eyes     potassium chloride SA (KLOR-CON) 20 MEQ tablet TAKE 1 TABLET BY MOUTH DAILY 90 tablet 1   sacubitril-valsartan (ENTRESTO) 49-51 MG Take 1 tablet by mouth 2 (two) times daily. 60 tablet 0   spironolactone (ALDACTONE) 25 MG tablet Take 1/2 tablet (12.5 mg total) by  mouth daily. 30 tablet 0   thiamine (VITAMIN B-1) 100 MG tablet Take 1 tablet (100 mg total) by mouth daily. 90 tablet 1   triamcinolone cream (KENALOG) 0.1 % Apply 1 application topically 2 (two) times daily.     varenicline (CHANTIX CONTINUING MONTH PAK) 1 MG tablet Take 1 tablet (1 mg total) by mouth 2 (two) times daily. 60 tablet 3   varenicline (CHANTIX PAK) 0.5 MG X 11 & 1 MG X 42 tablet Take one 0.5 mg tablet by mouth once daily for 3 days, then increase to one 0.5 mg tablet twice daily for 4 days, then increase to one 1 mg tablet twice daily. 53 tablet 0   vitamin C (ASCORBIC ACID) 250 MG tablet Take 250 mg by mouth every morning.     No current facility-administered medications for this encounter.    Allergies  Allergen Reactions   Hydromet [Hydrocodone Bit-Homatrop Mbr] Itching   Iodine-131 Itching   Iohexol Hives and Itching    PT DEVELOPS ITCHING AND HIVES SEVERAL HOURS AFTER INJECTION OF OMNIPAQUE    Penicillins Itching    Has patient had a PCN reaction causing immediate rash, facial/tongue/throat swelling, SOB or lightheadedness with hypotension: Yes Has patient had a PCN reaction causing severe rash involving mucus membranes or skin necrosis: No Has patient had a PCN reaction that required hospitalization: No Has patient had a PCN reaction occurring within the last 10 years: No If all of the above answers are "NO", then may proceed with Cephalosporin use.    Red Dye Itching   Social History   Socioeconomic History   Marital status: Married    Spouse name: Chad Lyons   Number of children: 1   Years of education: Not on file   Highest education level: Bachelor's degree (e.g., BA, AB, BS)  Occupational History   Occupation: Sales  Tobacco Use   Smoking status: Every Day    Packs/day: 0.50    Types: Cigars, Cigarettes   Smokeless tobacco: Never   Tobacco comments:    Current Cigars 1 every other day -----QUIT cigarettes in 2006>> 1/2PPD x 15 years  Vaping Use    Vaping Use: Never used  Substance and Sexual Activity   Alcohol use: Yes    Alcohol/week: 40.0 standard drinks    Types: 40 Shots of liquor per week    Comment: 6-7 shots of liquor daily x 8 years. heavier on weekend   Drug use: No   Sexual activity: Not on file    Comment: still smokes cigars  Other Topics Concern   Not on file  Social History Narrative   Not on file   Social Determinants of Health   Financial Resource Strain: Low Risk    Difficulty  of Paying Living Expenses: Not hard at all  Food Insecurity: No Food Insecurity   Worried About [place] in the Last Year: Never true   [place] in the Last Year: Never true  Transportation Needs: No Transportation Needs   Lack of Transportation (Medical): No   Lack of Transportation (Non-Medical): No  Physical Activity: Sufficiently Active   Days of Exercise per Week: 5 days   Minutes of Exercise per Session: 30 min  Stress: Stress Concern Present   Feeling of Stress : To some extent  Social Connections: Engineer, building services of Communication with Friends and Family: More than three times a week   Frequency of Social Gatherings with Friends and Family: Once a week   Attends Religious Services: More than 4 times per year   Active Member of [employer] or Organizations: Yes   Attends Music therapist: More than 4 times per year   Marital Status: Married  Human resources officer Violence: Not on file   Family History  Problem Relation Age of Onset   Heart disease Mother    High blood pressure Mother    Diabetes Mother    Heart disease Maternal Aunt    Cancer Maternal Uncle        pancreatic   Heart disease Maternal Uncle    Rheum arthritis Maternal Grandmother    High blood pressure Maternal Grandmother    Colon cancer Neg Hx    BP 128/80    Pulse 85    Wt (!) 148.7 kg (327 lb 12.8 oz)    SpO2 99%    BMI 43.25 kg/m   Wt Readings from Last 3 Encounters:  09/13/21 (!) 148.7 kg (327 lb 12.8  oz)  09/12/21 (!) 147 kg (324 lb)  09/07/21 (!) 146.1 kg (322 lb)    PHYSICAL EXAM: General:  NAD. No resp difficulty HEENT: Normal Neck: Supple. JVP 7-8. Carotids 2+ bilat; no bruits. No lymphadenopathy or thryomegaly appreciated. Cor: PMI nondisplaced. Irregular tachy rate & rhythm. No rubs, gallops or murmurs. Lungs: Clear Abdomen: Obese, nontender, nondistended. No hepatosplenomegaly. No bruits or masses. Good bowel sounds. Extremities: No cyanosis, clubbing, rash, edema Neuro: Alert & oriented x 3, cranial nerves grossly intact. Moves all 4 extremities w/o difficulty. Affect pleasant.  ECG: atrial fibrillation 107 bpm, +LVH ? LBBB  ReDs: 48%  ASSESSMENT & PLAN: 1. Acute on chronic HFpEF, HCM - Echo 2018 EF 50-55% - Echo 09/05/21 EF 40-45% (DB thought 50%) + LVH - May need ischemic work up. - cMRI (1/23): LVEF 35%, RVEF 42%, study consistent w HCM. - Will need CPX down the road and first degree relatives will need genetic testing. - Stable NYHA II, he is volume overloaded today, ReDs 48%, likely in setting of new AF. - Increase Lasix to 80 mg bid + KCL 20 bid. - Continue carvedilol 3.125 mg bid. - Continue Entresto 49/51 mg bid. - Continue spiro 12.5 mg daily. - Add SGLT2i next. - BMET/BNP today; BMET in 10-14 days.  2. New atrial fibrillation w/ RVR - Palps started this week, ECG with AF HR 107  - Start amiodarone 200 mg bid. - Start Eliquis 5 mg bid. Co-pay card given - Continue beta blocker. Will not increase today with fluid on board. - Discussed with Dr. Haroldine Laws, will arrange for DCCV in 3 weeks. - Continue CPAP. Needs to abstain from ETOH. - Check TSH; CBC in 10-14 days.   3. OSA - Continue CPAP nightly. -  Need weight loss.    4.  HTN, severe  - Much improved. - Continue meds as above and diurese.  5. Obesity  - S/P lap band in 2012. - Body mass index is 43.25 kg/m. - Consider GLP1RA.   6. Tobacco/ETOH Abuse - Encouraged cessation.   7.  Anxiety - Long discussion about work/life balance and current life stressors. - He declined referral for counseling or trial of SSRI.  Follow up with APP 2 weeks after DCCV. Will need to arrange genetic testing for 1st degree relatives (HCM) and continue with GDMT.  Defer CPX for now.  Chad Katz, FNP-BC 09/13/21

## 2021-09-13 NOTE — Progress Notes (Addendum)
ADVANCED HF CLINIC CONSULT NOTE   Primary Care: [name], NP HF Cardiologist: Dr. Haroldine Laws  HPI: [name] is a 56 y.o.with history of HFpEF, diverticulitis, HTN, S/P Lap Band, tobacco abuse, and OSA (uses CPAP), and new diagnosis of hypertrophic cardiomyopathy.   Echo in 2018 EF 50-55%.    Admitted 1/23 with a/c CHF. He was diuresed with IV lasix. Echo showed EF 40-45%, +LVH (Dr. Haroldine Laws read as 50%). cMRI showed LVEF 35%, moderate asymmetric septal hypertrophy, no LVOT, RVEF 42%, findings consistent with hypertrophic cardiomyopathy. He was discharged on GDMT with spiro, Lasix, carvedilol and Entresto; discharge weight 325 lbs.   Today he returns for post hospital HF follow up with wife. Overall feeling fine. No SOB with walking, carrying groceries or going up stairs. Noticed palpitations this past week, felt heart pounding. Denies CP, dizziness, edema, or PND/Orthopnea. Appetite ok. No fever or chills. Weight at home 324 pounds. Taking all medications. Smoking 3-4 cigs/day. Wearing CPAP nightly. Drinking ETOH 1/2 gallon vodka/week (3-4 mixed drinks a night).  FH: mother has Afib, brother has MI and AF  - Echo (1/23): EF 40-45%, + LVH - cMRI (1/23): LVEF 35%, RVEF 42%, study consistent w HCM.  Review of Systems: [y] = yes, [ ]  = no   General: Weight gain [ ] ; Weight loss [ ] ; Anorexia [ ] ; Fatigue [ ] ; Fever [ ] ; Chills [ ] ; Weakness [ ]   Cardiac: Chest pain/pressure [ ] ; Resting SOB [ ] ; Exertional SOB [ ] ; Orthopnea [ ] ; Pedal Edema [ ] ; Palpitations Blue.Reese ]; Syncope [ ] ; Presyncope [ ] ; Paroxysmal nocturnal dyspnea[ ]   Pulmonary: Cough [ ] ; Wheezing[ ] ; Hemoptysis[ ] ; Sputum [ ] ; Snoring [ ]   GI: Vomiting[ ] ; Dysphagia[ ] ; Melena[ ] ; Hematochezia [ ] ; Heartburn[ ] ; Abdominal pain [ ] ; Constipation [ ] ; Diarrhea [ ] ; BRBPR [ ]   GU: Hematuria[ ] ; Dysuria [ ] ; Nocturia[ ]   Vascular: Pain in legs with walking [ ] ; Pain in feet with lying flat [ ] ; Non-healing sores [ ] ; Stroke [  ]; TIA [ ] ; Slurred speech [ ] ;  Neuro: Headaches[ ] ; Vertigo[ ] ; Seizures[ ] ; Paresthesias[ ] ;Blurred vision [ ] ; Diplopia [ ] ; Vision changes [ ]   Ortho/Skin: Arthritis [ ] ; Joint pain [ ] ; Muscle pain [ ] ; Joint swelling [ ] ; Back Pain [ ] ; Rash [ ]   Psych: Depression[ ] ; Anxiety[ ]   Heme: Bleeding problems [ ] ; Clotting disorders [ ] ; Anemia [ ]   Endocrine: Diabetes [ ] ; Thyroid dysfunction[ ]   Past Medical History:  Diagnosis Date   Diverticulitis    HX OF   EKG, abnormal    HX OF LEFT ANTERIO FASCICULAR BLOCK ON 02-15-15 EKG   GERD (gastroesophageal reflux disease)    Heart murmur    NOW RESOLVED   Hypertension    OSA (obstructive sleep apnea) 11/27/2016   Ventral incisional hernia    Current Outpatient Medications  Medication Sig Dispense Refill   acetaminophen (TYLENOL) 500 MG tablet Take 1,000 mg by mouth every 6 (six) hours as needed for headache (pain).     ALPRAZolam (XANAX) 0.5 MG tablet Take 1 tablet (0.5 mg total) by mouth daily as needed. 90 tablet 0   benzonatate (TESSALON) 200 MG capsule Take 1 capsule (200 mg total) by mouth 2 (two) times daily as needed for cough. 20 capsule 0   carvedilol (COREG) 3.125 MG tablet Take 1 tablet (3.125 mg total) by mouth 2 (two) times daily with a meal. 60 tablet 0  fluticasone (FLONASE) 50 MCG/ACT nasal spray Place 2 sprays into both nostrils daily. 48 g 1   furosemide (LASIX) 40 MG tablet Take 1 tablet (40 mg total) by mouth daily.     Multiple Vitamin (MULTIVITAMIN WITH MINERALS) TABS tablet Take 1 tablet by mouth every morning.     polyvinyl alcohol (LIQUIFILM TEARS) 1.4 % ophthalmic solution Place 1 drop into both eyes every morning. Rohto eye drops for dry eyes     potassium chloride SA (KLOR-CON) 20 MEQ tablet TAKE 1 TABLET BY MOUTH DAILY 90 tablet 1   sacubitril-valsartan (ENTRESTO) 49-51 MG Take 1 tablet by mouth 2 (two) times daily. 60 tablet 0   spironolactone (ALDACTONE) 25 MG tablet Take 1/2 tablet (12.5 mg total) by  mouth daily. 30 tablet 0   thiamine (VITAMIN B-1) 100 MG tablet Take 1 tablet (100 mg total) by mouth daily. 90 tablet 1   triamcinolone cream (KENALOG) 0.1 % Apply 1 application topically 2 (two) times daily.     varenicline (CHANTIX CONTINUING MONTH PAK) 1 MG tablet Take 1 tablet (1 mg total) by mouth 2 (two) times daily. 60 tablet 3   varenicline (CHANTIX PAK) 0.5 MG X 11 & 1 MG X 42 tablet Take one 0.5 mg tablet by mouth once daily for 3 days, then increase to one 0.5 mg tablet twice daily for 4 days, then increase to one 1 mg tablet twice daily. 53 tablet 0   vitamin C (ASCORBIC ACID) 250 MG tablet Take 250 mg by mouth every morning.     No current facility-administered medications for this encounter.    Allergies  Allergen Reactions   Hydromet [Hydrocodone Bit-Homatrop Mbr] Itching   Iodine-131 Itching   Iohexol Hives and Itching    PT DEVELOPS ITCHING AND HIVES SEVERAL HOURS AFTER INJECTION OF OMNIPAQUE    Penicillins Itching    Has patient had a PCN reaction causing immediate rash, facial/tongue/throat swelling, SOB or lightheadedness with hypotension: Yes Has patient had a PCN reaction causing severe rash involving mucus membranes or skin necrosis: No Has patient had a PCN reaction that required hospitalization: No Has patient had a PCN reaction occurring within the last 10 years: No If all of the above answers are "NO", then may proceed with Cephalosporin use.    Red Dye Itching   Social History   Socioeconomic History   Marital status: Married    Spouse name: Russell Quinney   Number of children: 1   Years of education: Not on file   Highest education level: Bachelor's degree (e.g., BA, AB, BS)  Occupational History   Occupation: Sales  Tobacco Use   Smoking status: Every Day    Packs/day: 0.50    Types: Cigars, Cigarettes   Smokeless tobacco: Never   Tobacco comments:    Current Cigars 1 every other day -----QUIT cigarettes in 2006>> 1/2PPD x 15 years  Vaping Use    Vaping Use: Never used  Substance and Sexual Activity   Alcohol use: Yes    Alcohol/week: 40.0 standard drinks    Types: 40 Shots of liquor per week    Comment: 6-7 shots of liquor daily x 8 years. heavier on weekend   Drug use: No   Sexual activity: Not on file    Comment: still smokes cigars  Other Topics Concern   Not on file  Social History Narrative   Not on file   Social Determinants of Health   Financial Resource Strain: Low Risk    Difficulty  of Paying Living Expenses: Not hard at all  Food Insecurity: No Food Insecurity   Worried About [place] in the Last Year: Never true   [place] in the Last Year: Never true  Transportation Needs: No Transportation Needs   Lack of Transportation (Medical): No   Lack of Transportation (Non-Medical): No  Physical Activity: Sufficiently Active   Days of Exercise per Week: 5 days   Minutes of Exercise per Session: 30 min  Stress: Stress Concern Present   Feeling of Stress : To some extent  Social Connections: Engineer, building services of Communication with Friends and Family: More than three times a week   Frequency of Social Gatherings with Friends and Family: Once a week   Attends Religious Services: More than 4 times per year   Active Member of [employer] or Organizations: Yes   Attends Music therapist: More than 4 times per year   Marital Status: Married  Human resources officer Violence: Not on file   Family History  Problem Relation Age of Onset   Heart disease Mother    High blood pressure Mother    Diabetes Mother    Heart disease Maternal Aunt    Cancer Maternal Uncle        pancreatic   Heart disease Maternal Uncle    Rheum arthritis Maternal Grandmother    High blood pressure Maternal Grandmother    Colon cancer Neg Hx    BP 128/80    Pulse 85    Wt (!) 148.7 kg (327 lb 12.8 oz)    SpO2 99%    BMI 43.25 kg/m   Wt Readings from Last 3 Encounters:  09/13/21 (!) 148.7 kg (327 lb 12.8  oz)  09/12/21 (!) 147 kg (324 lb)  09/07/21 (!) 146.1 kg (322 lb)    PHYSICAL EXAM: General:  NAD. No resp difficulty HEENT: Normal Neck: Supple. JVP 7-8. Carotids 2+ bilat; no bruits. No lymphadenopathy or thryomegaly appreciated. Cor: PMI nondisplaced. Irregular tachy rate & rhythm. No rubs, gallops or murmurs. Lungs: Clear Abdomen: Obese, nontender, nondistended. No hepatosplenomegaly. No bruits or masses. Good bowel sounds. Extremities: No cyanosis, clubbing, rash, edema Neuro: Alert & oriented x 3, cranial nerves grossly intact. Moves all 4 extremities w/o difficulty. Affect pleasant.  ECG: atrial fibrillation 107 bpm, +LVH ? LBBB  ReDs: 48%  ASSESSMENT & PLAN: 1. Acute on chronic HFpEF, HCM - Echo 2018 EF 50-55% - Echo 09/05/21 EF 40-45% (DB thought 50%) + LVH - May need ischemic work up. - cMRI (1/23): LVEF 35%, RVEF 42%, study consistent w HCM. - Will need CPX down the road and first degree relatives will need genetic testing. - Stable NYHA II, he is volume overloaded today, ReDs 48%, likely in setting of new AF. - Increase Lasix to 80 mg bid + KCL 20 bid. - Continue carvedilol 3.125 mg bid. - Continue Entresto 49/51 mg bid. - Continue spiro 12.5 mg daily. - Add SGLT2i next. - BMET/BNP today; BMET in 10-14 days.  2. New atrial fibrillation w/ RVR - Palps started this week, ECG with AF HR 107  - Start amiodarone 200 mg bid. - Start Eliquis 5 mg bid. Co-pay card given - Continue beta blocker. Will not increase today with fluid on board. - Discussed with Dr. Haroldine Laws, will arrange for DCCV in 3 weeks. - Continue CPAP. Needs to abstain from ETOH. - Check TSH; CBC in 10-14 days.   3. OSA - Continue CPAP nightly. -  Need weight loss.    4.  HTN, severe  - Much improved. - Continue meds as above and diurese.  5. Obesity  - S/P lap band in 2012. - Body mass index is 43.25 kg/m. - Consider GLP1RA.   6. Tobacco/ETOH Abuse - Encouraged cessation.   7.  Anxiety - Long discussion about work/life balance and current life stressors. - He declined referral for counseling or trial of SSRI.  Follow up with APP 2 weeks after DCCV. Will need to arrange genetic testing for 1st degree relatives (HCM) and continue with GDMT.  Defer CPX for now.  Allena Katz, FNP-BC 09/13/21

## 2021-09-13 NOTE — Progress Notes (Signed)
ReDS Vest / Clip - 09/13/21 1503       ReDS Vest / Clip   Station Marker D    Ruler Value 35    ReDS Value Range High volume overload    ReDS Actual Value 48    Anatomical Comments sitting

## 2021-09-25 ENCOUNTER — Encounter (HOSPITAL_COMMUNITY): Payer: Self-pay | Admitting: Internal Medicine

## 2021-09-27 ENCOUNTER — Other Ambulatory Visit: Payer: Self-pay

## 2021-09-27 ENCOUNTER — Ambulatory Visit (HOSPITAL_COMMUNITY)
Admission: RE | Admit: 2021-09-27 | Discharge: 2021-09-27 | Disposition: A | Discharge status: Home / Self Care | Payer: 59 | Source: Ambulatory Visit | Attending: Cardiology | Admitting: Cardiology

## 2021-09-27 DIAGNOSIS — I5032 Chronic diastolic (congestive) heart failure: Secondary | ICD-10-CM | POA: Diagnosis present

## 2021-09-27 LAB — BASIC METABOLIC PANEL
Anion gap: 9 (ref 5–15)
BUN: 15 mg/dL (ref 6–20)
CO2: 23 mmol/L (ref 22–32)
Calcium: 9.2 mg/dL (ref 8.9–10.3)
Chloride: 105 mmol/L (ref 98–111)
Creatinine, Ser: 1.33 mg/dL — ABNORMAL HIGH (ref 0.61–1.24)
GFR, Estimated: >60 mL/min (ref >60)
Glucose, Bld: 131 mg/dL — ABNORMAL HIGH (ref 70–99)
Potassium: 3.9 mmol/L (ref 3.5–5.1)
Sodium: 137 mmol/L (ref 135–145)

## 2021-09-27 LAB — CBC
HCT: 48 % (ref 39.0–52.0)
Hemoglobin: 15.4 g/dL (ref 13.0–17.0)
MCH: 28.6 pg (ref 26.0–34.0)
MCHC: 32.1 g/dL (ref 30.0–36.0)
MCV: 89.2 fL (ref 80.0–100.0)
Platelets: 185 10*3/uL (ref 150–400)
RBC: 5.38 MIL/uL (ref 4.22–5.81)
RDW: 14.6 % (ref 11.5–15.5)
WBC: 7.1 10*3/uL (ref 4.0–10.5)
nRBC: 0 % (ref 0.0–0.2)

## 2021-10-02 ENCOUNTER — Other Ambulatory Visit (HOSPITAL_COMMUNITY): Payer: Self-pay

## 2021-10-02 ENCOUNTER — Other Ambulatory Visit (HOSPITAL_COMMUNITY): Payer: Self-pay | Admitting: *Deleted

## 2021-10-02 MED ORDER — SPIRONOLACTONE 25 MG PO TABS: 12.5000 mg | ORAL_TABLET | Freq: Every day | ORAL | 3 refills | Status: DC

## 2021-10-02 MED ORDER — CARVEDILOL 3.125 MG PO TABS: 3.1250 mg | ORAL_TABLET | Freq: Two times a day (BID) | ORAL | 3 refills | Status: DC

## 2021-10-02 MED ORDER — SACUBITRIL-VALSARTAN 49-51 MG PO TABS: 1.0000 | ORAL_TABLET | Freq: Two times a day (BID) | ORAL | 3 refills | Status: DC

## 2021-10-03 ENCOUNTER — Encounter (HOSPITAL_COMMUNITY): Payer: Self-pay | Admitting: Internal Medicine

## 2021-10-03 ENCOUNTER — Ambulatory Visit (HOSPITAL_COMMUNITY): Payer: 59 | Admitting: Anesthesiology

## 2021-10-03 ENCOUNTER — Encounter (HOSPITAL_COMMUNITY)
Admission: RE | Disposition: A | Discharge status: Home / Self Care | Payer: Self-pay | Source: Ambulatory Visit | Attending: Internal Medicine

## 2021-10-03 ENCOUNTER — Ambulatory Visit (HOSPITAL_BASED_OUTPATIENT_CLINIC_OR_DEPARTMENT_OTHER): Payer: 59 | Admitting: Anesthesiology

## 2021-10-03 ENCOUNTER — Ambulatory Visit (HOSPITAL_COMMUNITY)
Admission: RE | Admit: 2021-10-03 | Discharge: 2021-10-03 | Disposition: A | Discharge status: Home / Self Care | Payer: 59 | Source: Ambulatory Visit | Attending: Internal Medicine | Admitting: Internal Medicine

## 2021-10-03 DIAGNOSIS — Z79899 Other long term (current) drug therapy: Secondary | ICD-10-CM | POA: Insufficient documentation

## 2021-10-03 DIAGNOSIS — Z6841 Body Mass Index (BMI) 40.0 and over, adult: Secondary | ICD-10-CM | POA: Insufficient documentation

## 2021-10-03 DIAGNOSIS — I509 Heart failure, unspecified: Secondary | ICD-10-CM

## 2021-10-03 DIAGNOSIS — F419 Anxiety disorder, unspecified: Secondary | ICD-10-CM | POA: Insufficient documentation

## 2021-10-03 DIAGNOSIS — G4730 Sleep apnea, unspecified: Secondary | ICD-10-CM | POA: Insufficient documentation

## 2021-10-03 DIAGNOSIS — I482 Chronic atrial fibrillation, unspecified: Secondary | ICD-10-CM | POA: Diagnosis not present

## 2021-10-03 DIAGNOSIS — G4733 Obstructive sleep apnea (adult) (pediatric): Secondary | ICD-10-CM | POA: Insufficient documentation

## 2021-10-03 DIAGNOSIS — E669 Obesity, unspecified: Secondary | ICD-10-CM | POA: Insufficient documentation

## 2021-10-03 DIAGNOSIS — I130 Hypertensive heart and chronic kidney disease with heart failure and stage 1 through stage 4 chronic kidney disease, or unspecified chronic kidney disease: Secondary | ICD-10-CM

## 2021-10-03 DIAGNOSIS — I5032 Chronic diastolic (congestive) heart failure: Secondary | ICD-10-CM | POA: Diagnosis not present

## 2021-10-03 DIAGNOSIS — I422 Other hypertrophic cardiomyopathy: Secondary | ICD-10-CM | POA: Diagnosis not present

## 2021-10-03 DIAGNOSIS — Z9884 Bariatric surgery status: Secondary | ICD-10-CM | POA: Insufficient documentation

## 2021-10-03 DIAGNOSIS — Z7901 Long term (current) use of anticoagulants: Secondary | ICD-10-CM | POA: Diagnosis not present

## 2021-10-03 DIAGNOSIS — N189 Chronic kidney disease, unspecified: Secondary | ICD-10-CM | POA: Insufficient documentation

## 2021-10-03 DIAGNOSIS — I4892 Unspecified atrial flutter: Secondary | ICD-10-CM | POA: Insufficient documentation

## 2021-10-03 DIAGNOSIS — I4891 Unspecified atrial fibrillation: Secondary | ICD-10-CM

## 2021-10-03 DIAGNOSIS — F17290 Nicotine dependence, other tobacco product, uncomplicated: Secondary | ICD-10-CM | POA: Insufficient documentation

## 2021-10-03 HISTORY — PX: CARDIOVERSION: SHX1299

## 2021-10-03 SURGERY — CARDIOVERSION
Anesthesia: General

## 2021-10-03 MED ORDER — SODIUM CHLORIDE 0.9 % IV SOLN: INTRAVENOUS | Status: DC

## 2021-10-03 MED ORDER — LIDOCAINE 2% (20 MG/ML) 5 ML SYRINGE
INTRAMUSCULAR | Status: DC | PRN
Start: 2021-10-03 — End: 2021-10-03
  Administered 2021-10-03: 20 mg via INTRAVENOUS

## 2021-10-03 MED ORDER — PROPOFOL 10 MG/ML IV BOLUS
INTRAVENOUS | Status: DC | PRN
  Administered 2021-10-03: 70 mg via INTRAVENOUS
  Administered 2021-10-03: 30 mg via INTRAVENOUS

## 2021-10-03 NOTE — CV Procedure (Signed)
° °   DIRECT CURRENT CARDIOVERSION  NAME:  Chad Lyons   MRN: [id number] DOB:  November 10, 1965   ADMIT DATE: 10/03/2021   INDICATIONS: Atrial flutter   PROCEDURE:   Informed consent was obtained prior to the procedure. The risks, benefits and alternatives for the procedure were discussed and the patient comprehended these risks. Once an appropriate time out was taken, the patient had the defibrillator pads placed in the anterior and posterior position. The patient then underwent sedation by the anesthesia service. Once an appropriate level of sedation was achieved, the patient received a single biphasic, synchronized 200J shock with prompt conversion to sinus rhythm. No apparent complications.  Glori Bickers, MD  7:53 AM

## 2021-10-03 NOTE — Anesthesia Postprocedure Evaluation (Signed)
Anesthesia Post Note  Patient: Chad Lyons  Procedure(s) Performed: CARDIOVERSION     Patient location during evaluation: PACU Anesthesia Type: General Level of consciousness: awake and alert Pain management: pain level controlled Vital Signs Assessment: post-procedure vital signs reviewed and stable Respiratory status: spontaneous breathing, nonlabored ventilation, respiratory function stable and patient connected to nasal cannula oxygen Cardiovascular status: blood pressure returned to baseline and stable Postop Assessment: no apparent nausea or vomiting Anesthetic complications: no   No notable events documented.  Last Vitals:  Vitals:   10/03/21 0800 10/03/21 0810  BP: 130/87 (!) 137/94  Pulse: (!) 53 (!) 56  Resp: 20 19  Temp:    SpO2: 99% 98%    Last Pain:  Vitals:   10/03/21 0810  TempSrc:   PainSc: 0-No pain                 Tiajuana Amass

## 2021-10-03 NOTE — Interval H&P Note (Signed)
History and Physical Interval Note:  10/03/2021 7:52 AM  Chad Lyons  has presented today for surgery, with the diagnosis of AFIB/AFLUTTTER.  The various methods of treatment have been discussed with the patient and family. After consideration of risks, benefits and other options for treatment, the patient has consented to  Procedure(s): CARDIOVERSION (N/A) as a surgical intervention.  The patient's history has been reviewed, patient examined, no change in status, stable for surgery.  I have reviewed the patient's chart and labs.  Questions were answered to the patient's satisfaction.     [name]

## 2021-10-03 NOTE — Anesthesia Preprocedure Evaluation (Signed)
Anesthesia Evaluation  Patient identified by MRN, date of birth, ID band Patient awake    Reviewed: Allergy & Precautions, NPO status , Patient's Chart, lab work & pertinent test results  Airway Mallampati: II  TM Distance: >3 FB Neck ROM: Full    Dental   Pulmonary sleep apnea , Current Smoker,    breath sounds clear to auscultation       Cardiovascular hypertension, Pt. on medications and Pt. on home beta blockers +CHF  + dysrhythmias Atrial Fibrillation  Rhythm:Irregular Rate:Normal     Neuro/Psych negative neurological ROS     GI/Hepatic Neg liver ROS, GERD  ,  Endo/Other  negative endocrine ROS  Renal/GU CRFRenal disease     Musculoskeletal   Abdominal   Peds  Hematology negative hematology ROS (+)   Anesthesia Other Findings   Reproductive/Obstetrics                             Anesthesia Physical Anesthesia Plan  ASA: 3  Anesthesia Plan: General   Post-op Pain Management: Minimal or no pain anticipated   Induction: Intravenous  PONV Risk Score and Plan: 1 and Treatment may vary due to age or medical condition  Airway Management Planned: Natural Airway and Mask  Additional Equipment:   Intra-op Plan:   Post-operative Plan:   Informed Consent: I have reviewed the patients History and Physical, chart, labs and discussed the procedure including the risks, benefits and alternatives for the proposed anesthesia with the patient or authorized representative who has indicated his/her understanding and acceptance.       Plan Discussed with:   Anesthesia Plan Comments:         Anesthesia Quick Evaluation

## 2021-10-03 NOTE — Transfer of Care (Signed)
Immediate Anesthesia Transfer of Care Note  Patient: Chad Lyons  Procedure(s) Performed: CARDIOVERSION  Patient Location: Endoscopy Unit  Anesthesia Type:General  Level of Consciousness: drowsy and patient cooperative  Airway & Oxygen Therapy: Patient Spontanous Breathing  Post-op Assessment: Report given to RN and Post -op Vital signs reviewed and stable  Post vital signs: Reviewed and stable  Last Vitals:  Vitals Value Taken Time  BP 148/97 (112) 7:51 AM   Temp    Pulse 73 10/03/21 0751  Resp 15 10/03/21 0751  SpO2 92 % 10/03/21 0751  Vitals shown include unvalidated device data.  Last Pain:  Vitals:   10/03/21 0700  TempSrc: Temporal  PainSc: 0-No pain         Complications: No notable events documented.

## 2021-10-03 NOTE — Discharge Instructions (Signed)

## 2021-10-06 ENCOUNTER — Encounter: Payer: Self-pay | Admitting: Adult Health

## 2021-10-06 ENCOUNTER — Other Ambulatory Visit: Payer: Self-pay | Admitting: Adult Health

## 2021-10-08 ENCOUNTER — Other Ambulatory Visit (HOSPITAL_COMMUNITY): Payer: Self-pay

## 2021-10-08 MED ORDER — TRIAMCINOLONE ACETONIDE 0.1 % EX CREA
1.0000 "application " | TOPICAL_CREAM | Freq: Two times a day (BID) | CUTANEOUS | 0 refills | Status: DC | PRN
  Filled 2021-10-08: qty 30, 15d supply, fill #0

## 2021-10-14 ENCOUNTER — Encounter: Payer: Self-pay | Admitting: Adult Health

## 2021-10-15 ENCOUNTER — Other Ambulatory Visit: Payer: Self-pay | Admitting: Adult Health

## 2021-10-15 MED ORDER — VARENICLINE TARTRATE 1 MG PO TABS: 1.0000 mg | ORAL_TABLET | Freq: Two times a day (BID) | ORAL | 3 refills | Status: AC

## 2021-10-15 NOTE — Progress Notes (Signed)
ADVANCED HF CLINIC CONSULT NOTE   Primary Care: [name], NP HF Cardiologist: Dr. Haroldine Laws  HPI: Chad Lyons is a 56 y.o.with history of HFpEF, diverticulitis, HTN, S/P Lap Band, tobacco abuse, and OSA (uses CPAP), and new diagnosis of hypertrophic cardiomyopathy.   Echo in 2018 EF 50-55%.    Admitted 1/23 with a/c CHF. He was diuresed with IV lasix. Echo showed EF 40-45%, +LVH (Dr. Haroldine Laws read as 50%). cMRI showed LVEF 35%, moderate asymmetric septal hypertrophy, no LVOT, RVEF 42%, findings consistent with hypertrophic cardiomyopathy. He was discharged on GDMT with spiro, Lasix, carvedilol and Entresto; discharge weight 325 lbs.  Hospital follow up up he was in Afib, CVR and volume overloaded. Amio and Eliquis started, DCCV arranged and Lasix increased.  DCCV 09/23/21 to NSR.  Today he returns for HF follow up with his wife. Overall feeling better. He is not SOB running up and down his stairs now. Has been making dietary changes and noticing weight is coming down.  Denies palpitations, abnormal  bleeding, CP, dizziness, edema, or PND/Orthopnea. Appetite ok. No fever or chills. Weight at home 313 pounds. Taking all medications. Wearing CPAP nightly. Finished Chantix, almost cut smoking out completely. Drinking ETOH 1 drink/night.   FH: mother has Afib, brother has MI and AF  Cardiac Studies: - Echo (1/23): EF 40-45%, + LVH  - cMRI (1/23): LVEF 35%, RVEF 42%, study consistent w HCM.   Past Medical History:  Diagnosis Date   Diverticulitis    HX OF   EKG, abnormal    HX OF LEFT ANTERIO FASCICULAR BLOCK ON 02-15-15 EKG   GERD (gastroesophageal reflux disease)    Heart murmur    NOW RESOLVED   Hypertension    OSA (obstructive sleep apnea) 11/27/2016   Ventral incisional hernia    Current Outpatient Medications  Medication Sig Dispense Refill   acetaminophen (TYLENOL) 500 MG tablet Take 1,000 mg by mouth every 6 (six) hours as needed for headache (pain).     ALPRAZolam  (XANAX) 0.5 MG tablet Take 1 tablet (0.5 mg total) by mouth daily as needed. 90 tablet 0   amiodarone (PACERONE) 200 MG tablet Take 1 tablet (200 mg total) by mouth daily. 60 tablet 2   apixaban (ELIQUIS) 5 MG TABS tablet Take 1 tablet (5 mg total) by mouth 2 (two) times daily. 60 tablet 6   b complex vitamins capsule Take 1 capsule by mouth daily.     carvedilol (COREG) 3.125 MG tablet Take 1 tablet (3.125 mg total) by mouth 2 (two) times daily with a meal. 60 tablet 3   Cholecalciferol (VITAMIN D3 PO) Take 1 capsule by mouth daily.     fluticasone (FLONASE) 50 MCG/ACT nasal spray Place 2 sprays into both nostrils daily. (Patient taking differently: Place 2 sprays into both nostrils daily as needed for allergies.) 48 g 1   furosemide (LASIX) 40 MG tablet Take 2 tablets (80 mg total) by mouth 2 (two) times daily. 60 tablet 3   Multiple Vitamin (MULTIVITAMIN WITH MINERALS) TABS tablet Take 1 tablet by mouth every morning.     polyvinyl alcohol (LIQUIFILM TEARS) 1.4 % ophthalmic solution Place 1 drop into both eyes every morning. Rohto eye drops for dry eyes     potassium chloride SA (KLOR-CON M) 20 MEQ tablet Take 1 tablet (20 mEq total) by mouth 2 (two) times daily. 90 tablet 1   sacubitril-valsartan (ENTRESTO) 49-51 MG Take 1 tablet by mouth 2 (two) times daily. 60 tablet 3  spironolactone (ALDACTONE) 25 MG tablet Take 1/2 tablet (12.5 mg total) by mouth daily. 15 tablet 3   triamcinolone cream (KENALOG) 0.1 % Apply topically 2 times daily as needed (rash). 30 g 0   varenicline (CHANTIX) 1 MG tablet Take 1 tablet (1 mg total) by mouth 2 (two) times daily. 60 tablet 3   No current facility-administered medications for this encounter.   Allergies  Allergen Reactions   Hydromet [Hydrocodone Bit-Homatrop Mbr] Itching   Iodine-131 Itching   Iohexol Hives and Itching    PT DEVELOPS ITCHING AND HIVES SEVERAL HOURS AFTER INJECTION OF OMNIPAQUE    Penicillins Itching    Has patient had a PCN  reaction causing immediate rash, facial/tongue/throat swelling, SOB or lightheadedness with hypotension: Yes Has patient had a PCN reaction causing severe rash involving mucus membranes or skin necrosis: No Has patient had a PCN reaction that required hospitalization: No Has patient had a PCN reaction occurring within the last 10 years: No If all of the above answers are "NO", then may proceed with Cephalosporin use.    Red Dye Itching   Social History   Socioeconomic History   Marital status: Married    Spouse name: Dre Gamino   Number of children: 1   Years of education: Not on file   Highest education level: Bachelor's degree (e.g., BA, AB, BS)  Occupational History   Occupation: Sales  Tobacco Use   Smoking status: Some Days    Packs/day: 0.50    Types: Cigarettes   Smokeless tobacco: Never   Tobacco comments:    Current Cigars 1 every other day -----QUIT cigarettes in 2006>> 1/2PPD x 15 years  Vaping Use   Vaping Use: Never used  Substance and Sexual Activity   Alcohol use: Yes    Alcohol/week: 40.0 standard drinks    Types: 40 Shots of liquor per week    Comment: 6-7 shots of liquor daily x 8 years. heavier on weekend   Drug use: No   Sexual activity: Not on file    Comment: still smokes cigars  Other Topics Concern   Not on file  Social History Narrative   Not on file   Social Determinants of Health   Financial Resource Strain: Low Risk    Difficulty of Paying Living Expenses: Not hard at all  Food Insecurity: No Food Insecurity   Worried About Charity fundraiser in the Last Year: Never true   [place] in the Last Year: Never true  Transportation Needs: No Transportation Needs   Lack of Transportation (Medical): No   Lack of Transportation (Non-Medical): No  Physical Activity: Sufficiently Active   Days of Exercise per Week: 5 days   Minutes of Exercise per Session: 30 min  Stress: Stress Concern Present   Feeling of Stress : To some extent   Social Connections: Engineer, building services of Communication with Friends and Family: More than three times a week   Frequency of Social Gatherings with Friends and Family: Once a week   Attends Religious Services: More than 4 times per year   Active Member of [employer] or Organizations: Yes   Attends Music therapist: More than 4 times per year   Marital Status: Married  Human resources officer Violence: Not on file   Family History  Problem Relation Age of Onset   Heart disease Mother    High blood pressure Mother    Diabetes Mother    Heart disease Maternal Aunt  Cancer Maternal Uncle        pancreatic   Heart disease Maternal Uncle    Rheum arthritis Maternal Grandmother    High blood pressure Maternal Grandmother    Colon cancer Neg Hx    BP 100/60    Pulse (!) 56    Wt (!) 144.5 kg (318 lb 9.6 oz)    SpO2 97%    BMI 42.03 kg/m   Wt Readings from Last 3 Encounters:  10/16/21 (!) 144.5 kg (318 lb 9.6 oz)  10/03/21 (!) 148.7 kg (327 lb 12.8 oz)  09/13/21 (!) 148.7 kg (327 lb 12.8 oz)   PHYSICAL EXAM: General:  NAD. No resp difficulty HEENT: Normal Neck: Supple. No JVD. Carotids 2+ bilat; no bruits. No lymphadenopathy or thryomegaly appreciated. Cor: PMI nondisplaced. Slow rate & rhythm. No rubs, gallops or murmurs. Lungs: Clear Abdomen: Obese, nontender, nondistended. No hepatosplenomegaly. No bruits or masses. Good bowel sounds. Extremities: No cyanosis, clubbing, rash, edema Neuro: Alert & oriented x 3, cranial nerves grossly intact. Moves all 4 extremities w/o difficulty. Affect pleasant.  ECG: SB, + LVH (personally reviewed).  ReDs: 49%  ASSESSMENT & PLAN: 1. Chronic HFpEF, HCM - Echo 2018 EF 50-55% - Echo 09/05/21 EF 40-45% (DB thought 50%) + LVH - May need ischemic work up. - cMRI (1/23): LVEF 35%, RVEF 42%, study consistent w HCM. - Will need CPX down the road and first degree relatives will need genetic testing. - Refer to Dr. Broadus John for  genetic testing. - Improved NYHA I-II, volume looks good today, weight down 9 lbs. ReDs 49% ? Accuracy with body habitus. - Start Farxiga 10 mg daily. - Continue Lasix 80 mg bid + KCL 20 bid. - Continue carvedilol 3.125 mg bid. - Continue Entresto 49/51 mg bid. - Continue spiro 12.5 mg daily. - Plan to repeat echo after GDMT optimized. - BMET/BNP today.  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 - CHADs-VASC: 2 - s/p DCCV 09/23/21--> NSR. - SB on ECG today. - Decrease amiodarone to 100 mg daily, plan to stop next visit if remains in sinus. - Continue Eliquis 5 mg bid. He has [employer] card. - Continue CPAP. Needs to abstain from ETOH. - CBC today.   3. OSA - Continue CPAP nightly. - Need weight loss.    4.  HTN, severe  - Much improved. - Continue meds as above.  5. Obesity  - S/P lap band in 2012. - Body mass index is 42.03 kg/m. - Consider GLP1RA.   6. Tobacco/ETOH Abuse - Encouraged cessation.  - Continue Chantix  Follow up in 3-4 weeks with APP and 12 weeks with Dr. Haroldine Laws + echo.  Allena Katz, FNP-BC 10/16/21

## 2021-10-15 NOTE — Telephone Encounter (Signed)
Please advise 

## 2021-10-16 ENCOUNTER — Encounter (HOSPITAL_COMMUNITY): Payer: Self-pay

## 2021-10-16 ENCOUNTER — Other Ambulatory Visit (HOSPITAL_COMMUNITY): Payer: Self-pay

## 2021-10-16 ENCOUNTER — Ambulatory Visit (HOSPITAL_COMMUNITY)
Admission: RE | Admit: 2021-10-16 | Discharge: 2021-10-16 | Disposition: A | Discharge status: Home / Self Care | Payer: 59 | Source: Ambulatory Visit | Attending: Family Medicine | Admitting: Family Medicine

## 2021-10-16 ENCOUNTER — Other Ambulatory Visit: Payer: Self-pay

## 2021-10-16 VITALS — BP 100/60 | HR 56 | Wt 318.6 lb

## 2021-10-16 DIAGNOSIS — I422 Other hypertrophic cardiomyopathy: Secondary | ICD-10-CM | POA: Diagnosis not present

## 2021-10-16 DIAGNOSIS — Z9989 Dependence on other enabling machines and devices: Secondary | ICD-10-CM | POA: Insufficient documentation

## 2021-10-16 DIAGNOSIS — Z6841 Body Mass Index (BMI) 40.0 and over, adult: Secondary | ICD-10-CM | POA: Insufficient documentation

## 2021-10-16 DIAGNOSIS — I5032 Chronic diastolic (congestive) heart failure: Secondary | ICD-10-CM | POA: Insufficient documentation

## 2021-10-16 DIAGNOSIS — I4891 Unspecified atrial fibrillation: Secondary | ICD-10-CM | POA: Diagnosis not present

## 2021-10-16 DIAGNOSIS — Z7901 Long term (current) use of anticoagulants: Secondary | ICD-10-CM | POA: Diagnosis not present

## 2021-10-16 DIAGNOSIS — G4733 Obstructive sleep apnea (adult) (pediatric): Secondary | ICD-10-CM | POA: Diagnosis not present

## 2021-10-16 DIAGNOSIS — I480 Paroxysmal atrial fibrillation: Secondary | ICD-10-CM | POA: Insufficient documentation

## 2021-10-16 DIAGNOSIS — Z9884 Bariatric surgery status: Secondary | ICD-10-CM | POA: Insufficient documentation

## 2021-10-16 DIAGNOSIS — F17210 Nicotine dependence, cigarettes, uncomplicated: Secondary | ICD-10-CM | POA: Diagnosis not present

## 2021-10-16 DIAGNOSIS — F1010 Alcohol abuse, uncomplicated: Secondary | ICD-10-CM | POA: Diagnosis not present

## 2021-10-16 DIAGNOSIS — E669 Obesity, unspecified: Secondary | ICD-10-CM | POA: Insufficient documentation

## 2021-10-16 DIAGNOSIS — Z79899 Other long term (current) drug therapy: Secondary | ICD-10-CM | POA: Diagnosis not present

## 2021-10-16 DIAGNOSIS — I110 Hypertensive heart disease with heart failure: Secondary | ICD-10-CM | POA: Insufficient documentation

## 2021-10-16 DIAGNOSIS — I10 Essential (primary) hypertension: Secondary | ICD-10-CM | POA: Diagnosis not present

## 2021-10-16 DIAGNOSIS — Z720 Tobacco use: Secondary | ICD-10-CM

## 2021-10-16 DIAGNOSIS — I5031 Acute diastolic (congestive) heart failure: Secondary | ICD-10-CM

## 2021-10-16 LAB — CBC
HCT: 51.5 % (ref 39.0–52.0)
Hemoglobin: 17.2 g/dL — ABNORMAL HIGH (ref 13.0–17.0)
MCH: 29.4 pg (ref 26.0–34.0)
MCHC: 33.4 g/dL (ref 30.0–36.0)
MCV: 87.9 fL (ref 80.0–100.0)
Platelets: 225 10*3/uL (ref 150–400)
RBC: 5.86 MIL/uL — ABNORMAL HIGH (ref 4.22–5.81)
RDW: 14.4 % (ref 11.5–15.5)
WBC: 7.1 10*3/uL (ref 4.0–10.5)
nRBC: 0 % (ref 0.0–0.2)

## 2021-10-16 LAB — BASIC METABOLIC PANEL
Anion gap: 9 (ref 5–15)
BUN: 18 mg/dL (ref 6–20)
CO2: 26 mmol/L (ref 22–32)
Calcium: 9.4 mg/dL (ref 8.9–10.3)
Chloride: 100 mmol/L (ref 98–111)
Creatinine, Ser: 1.19 mg/dL (ref 0.61–1.24)
GFR, Estimated: >60 mL/min (ref >60)
Glucose, Bld: 101 mg/dL — ABNORMAL HIGH (ref 70–99)
Potassium: 4.7 mmol/L (ref 3.5–5.1)
Sodium: 135 mmol/L (ref 135–145)

## 2021-10-16 LAB — BRAIN NATRIURETIC PEPTIDE: B Natriuretic Peptide: 132.2 pg/mL — ABNORMAL HIGH (ref 0.0–100.0)

## 2021-10-16 MED ORDER — DAPAGLIFLOZIN PROPANEDIOL 10 MG PO TABS: 10.0000 mg | ORAL_TABLET | Freq: Every day | ORAL | 5 refills | Status: DC

## 2021-10-16 MED ORDER — AMIODARONE HCL 200 MG PO TABS: 100.0000 mg | ORAL_TABLET | Freq: Every day | ORAL | 2 refills | Status: DC

## 2021-10-16 NOTE — Patient Instructions (Addendum)
Thank you for coming in today ? ?Labs were done today, if any labs are abnormal the clinic will call you ? ?You have been referred to genetic counseling they will contact you  ? ?START Farxiga 10 mg 1 tablet daily ? ?DECREASE Amiodarone 100 mg 1/2 daily  ? ?Your physician recommends that you schedule a follow-up appointment in:  ?4 week in clinic ?2-3 months with Dr. Haroldine Laws with echocardiogram ? ?Your physician has requested that you have an echocardiogram. Echocardiography is a painless test that uses sound waves to create images of your heart. It provides your doctor with information about the size and shape of your heart and how well your heart?s chambers and valves are working. This procedure takes approximately one hour. There are no restrictions for this procedure. ?  ?At the [facility], you and your health needs are our priority. As part of our continuing mission to provide you with exceptional heart care, we have created designated Provider Care Teams. These Care Teams include your primary Cardiologist (physician) and Advanced Practice Providers (APPs- Physician Assistants and Nurse Practitioners) who all work together to provide you with the care you need, when you need it.  ? ?You may see any of the following providers on your designated Care Team at your next follow up: ?Dr Glori Bickers ?Dr Loralie Champagne ?Darrick Grinder, NP ?Lyda Jester, PA ?Jessica Milford,NP ?Marlyce Huge, PA ?Audry Riles, PharmD ? ? ?Please be sure to bring in all your medications bottles to every appointment.  ? ?If you have any questions or concerns before your next appointment please send [place] a message through [employer] or call our office at [phone].   ? ?TO LEAVE A MESSAGE FOR THE NURSE SELECT OPTION 2, PLEASE LEAVE A MESSAGE INCLUDING: ?YOUR NAME ?DATE OF BIRTH ?CALL BACK NUMBER ?REASON FOR CALL**this is important as we prioritize the call backs ? ?YOU WILL RECEIVE A CALL BACK THE SAME DAY AS LONG AS  YOU CALL BEFORE 4:00 PM ?  ?

## 2021-10-16 NOTE — Progress Notes (Signed)
ReDS Vest / Clip - 10/16/21 1520   ? ?  ? ReDS Vest / Clip  ? Station Marker D   ? Ruler Value 32   ? ReDS Value Range High volume overload   ? ReDS Actual Value 49   ? Anatomical Comments sitting   ? ?  ?  ? ?  ? ? ?

## 2021-10-17 ENCOUNTER — Telehealth (HOSPITAL_COMMUNITY): Payer: Self-pay | Admitting: Cardiology

## 2021-10-17 MED ORDER — FUROSEMIDE 40 MG PO TABS
ORAL_TABLET | ORAL | 3 refills | Status: DC
Start: 2021-10-17 — End: 2022-01-08

## 2021-10-17 NOTE — Telephone Encounter (Signed)
Patient called.  Patient aware.  

## 2021-10-17 NOTE — Telephone Encounter (Signed)
-----   Message from Rafael Bihari, [place] sent at 10/16/2021  5:13 PM EST ----- ?BNP significantly improved.  ? ?Decrease Lasix to 80 mg q am/40 mg q pm ?

## 2021-11-05 ENCOUNTER — Telehealth (HOSPITAL_COMMUNITY): Payer: Self-pay | Admitting: *Deleted

## 2021-11-05 NOTE — Telephone Encounter (Signed)
Pts wife called back stating pts nose is consistently bleeding but its on and off. Not heavy leaking.  ?

## 2021-11-05 NOTE — Telephone Encounter (Signed)
Called pt back to get more information no answer/left vm for return call ?

## 2021-11-05 NOTE — Telephone Encounter (Signed)
Pts wife called to report pt has a nose bleed it started about three days ago. She wanted our office to be aware because he is on eliquis.  ? ?Routed to [employer]  ?

## 2021-11-06 NOTE — Telephone Encounter (Signed)
Pt's wife aware.

## 2021-11-11 NOTE — Progress Notes (Signed)
? ?ADVANCED HF CLINIC CONSULT NOTE ? ? ?Primary Care: [name], NP ?HF Cardiologist: Dr. Haroldine Laws ? ?HPI: ?[name] is a 56 y.o.with history of HFpEF, diverticulitis, HTN, S/P Lap Band, tobacco abuse, and OSA (uses CPAP), and new diagnosis of hypertrophic cardiomyopathy. ?  ?Echo in 2018 EF 50-55%.  ?  ?Admitted 1/23 with a/c CHF. He was diuresed with IV lasix. Echo showed EF 40-45%, +LVH (Dr. Haroldine Laws read as 50%). cMRI showed LVEF 35%, moderate asymmetric septal hypertrophy, no LVOT, RVEF 42%, findings consistent with hypertrophic cardiomyopathy. He was discharged on GDMT with spiro, Lasix, carvedilol and Entresto; discharge weight 325 lbs. ? ?Hospital follow up up he was in Afib, CVR and volume overloaded. Amio and Eliquis started, DCCV arranged and Lasix increased. ? ?DCCV 09/23/21 to NSR. ? ?Follow up amio decreased and Farxiga started.  ? ?Today he returns for HF follow up. Overall feeling fine. No SOB with walking up stairs or lifting. Had a nosebleed last week, held Eliquis for a day and resolved. Denies palpitations, CP, dizziness, edema, or PND/Orthopnea. Appetite ok. No fever or chills. Weight at home 311 pounds. Taking all medications. No longer smoking on Chantix, cut back on drinking. Wears CPAP nightly. ? ? ?FH: mother has Afib, brother has MI and AF ? ?Cardiac Studies: ?- Echo (1/23): EF 40-45%, + LVH ? ?- cMRI (1/23): LVEF 35%, RVEF 42%, study consistent w HCM. ? ? ?Past Medical History:  ?Diagnosis Date  ? Diverticulitis   ? HX OF  ? EKG, abnormal   ? HX OF LEFT ANTERIO FASCICULAR BLOCK ON 02-15-15 EKG  ? GERD (gastroesophageal reflux disease)   ? Heart murmur   ? NOW RESOLVED  ? Hypertension   ? OSA (obstructive sleep apnea) 11/27/2016  ? Ventral incisional hernia   ? ?Current Outpatient Medications  ?Medication Sig Dispense Refill  ? acetaminophen (TYLENOL) 500 MG tablet Take 1,000 mg by mouth every 6 (six) hours as needed for headache (pain).    ? ALPRAZolam (XANAX) 0.5 MG tablet Take 1  tablet (0.5 mg total) by mouth daily as needed. 90 tablet 0  ? amiodarone (PACERONE) 200 MG tablet Take 0.5 tablets (100 mg total) by mouth daily. 30 tablet 2  ? apixaban (ELIQUIS) 5 MG TABS tablet Take 1 tablet (5 mg total) by mouth 2 (two) times daily. 60 tablet 6  ? b complex vitamins capsule Take 1 capsule by mouth daily.    ? carvedilol (COREG) 3.125 MG tablet Take 1 tablet (3.125 mg total) by mouth 2 (two) times daily with a meal. 60 tablet 3  ? Cholecalciferol (VITAMIN D3 PO) Take 1 capsule by mouth daily.    ? dapagliflozin propanediol (FARXIGA) 10 MG TABS tablet Take 1 tablet (10 mg total) by mouth daily before breakfast. 30 tablet 5  ? fluticasone (FLONASE) 50 MCG/ACT nasal spray Place 2 sprays into both nostrils daily. (Patient taking differently: Place 2 sprays into both nostrils daily as needed for allergies.) 48 g 1  ? furosemide (LASIX) 40 MG tablet Take 2 tablets (80 mg total) by mouth every morning AND 1 tablet (40 mg total) every evening. 90 tablet 3  ? Multiple Vitamin (MULTIVITAMIN WITH MINERALS) TABS tablet Take 1 tablet by mouth every morning.    ? polyvinyl alcohol (LIQUIFILM TEARS) 1.4 % ophthalmic solution Place 1 drop into both eyes every morning. Rohto eye drops for dry eyes    ? potassium chloride SA (KLOR-CON M) 20 MEQ tablet Take 1 tablet (20 mEq total) by  mouth 2 (two) times daily. 90 tablet 1  ? sacubitril-valsartan (ENTRESTO) 49-51 MG Take 1 tablet by mouth 2 (two) times daily. 60 tablet 3  ? spironolactone (ALDACTONE) 25 MG tablet Take 1/2 tablet (12.5 mg total) by mouth daily. 15 tablet 3  ? triamcinolone cream (KENALOG) 0.1 % Apply topically 2 times daily as needed (rash). 30 g 0  ? varenicline (CHANTIX) 1 MG tablet Take 1 tablet (1 mg total) by mouth 2 (two) times daily. 60 tablet 3  ? ?No current facility-administered medications for this encounter.  ? ?Allergies  ?Allergen Reactions  ? Hydromet [Hydrocodone Bit-Homatrop Mbr] Itching  ? Iohexol Hives and Itching  ?  PT DEVELOPS  ITCHING AND HIVES SEVERAL HOURS AFTER INJECTION OF OMNIPAQUE ?  ? Iodine-131 Itching  ? Penicillins Itching  ?  Has patient had a PCN reaction causing immediate rash, facial/tongue/throat swelling, SOB or lightheadedness with hypotension: Yes ?Has patient had a PCN reaction causing severe rash involving mucus membranes or skin necrosis: No ?Has patient had a PCN reaction that required hospitalization: No ?Has patient had a PCN reaction occurring within the last 10 years: No ?If all of the above answers are "NO", then may proceed with Cephalosporin use. ?  ? Red Dye Itching  ? ?Social History  ? ?Socioeconomic History  ? Marital status: Married  ?  Spouse name: Chad Lyons  ? Number of children: 1  ? Years of education: Not on file  ? Highest education level: Bachelor's degree (e.g., BA, AB, BS)  ?Occupational History  ? Occupation: Sales  ?Tobacco Use  ? Smoking status: Former  ?  Packs/day: 0.50  ?  Types: Cigarettes  ? Smokeless tobacco: Never  ? Tobacco comments:  ?  Current Cigars 1 every other day -----QUIT cigarettes in 2006>> 1/2PPD x 15 years  ?Vaping Use  ? Vaping Use: Never used  ?Substance and Sexual Activity  ? Alcohol use: Yes  ?  Alcohol/week: 40.0 standard drinks  ?  Types: 40 Shots of liquor per week  ?  Comment: 6-7 shots of liquor daily x 8 years. heavier on weekend  ? Drug use: No  ? Sexual activity: Not on file  ?  Comment: still smokes cigars  ?Other Topics Concern  ? Not on file  ?Social History Narrative  ? Not on file  ? ?Social Determinants of Health  ? ?Financial Resource Strain: Low Risk   ? Difficulty of Paying Living Expenses: Not hard at all  ?Food Insecurity: No Food Insecurity  ? Worried About Charity fundraiser in the Last Year: Never true  ? Ran Out of Food in the Last Year: Never true  ?Transportation Needs: No Transportation Needs  ? Lack of Transportation (Medical): No  ? Lack of Transportation (Non-Medical): No  ?Physical Activity: Sufficiently Active  ? Days of Exercise per  Week: 5 days  ? Minutes of Exercise per Session: 30 min  ?Stress: Stress Concern Present  ? Feeling of Stress : To some extent  ?Social Connections: Socially Integrated  ? Frequency of Communication with Friends and Family: More than three times a week  ? Frequency of Social Gatherings with Friends and Family: Once a week  ? Attends Religious Services: More than 4 times per year  ? Active Member of Clubs or Organizations: Yes  ? Attends Archivist Meetings: More than 4 times per year  ? Marital Status: Married  ?Intimate Partner Violence: Not on file  ? ?Family History  ?Problem Relation  Age of Onset  ? Heart disease Mother   ? High blood pressure Mother   ? Diabetes Mother   ? Hypertension Brother   ? Heart failure Brother   ? Stroke Brother   ? Heart disease Maternal Aunt   ? Cancer Maternal Uncle   ?     pancreatic  ? Heart disease Maternal Uncle   ? Rheum arthritis Maternal Grandmother   ? High blood pressure Maternal Grandmother   ? Colon cancer Neg Hx   ? ?BP 110/80   Pulse (!) 53   Wt (!) 143.8 kg (317 lb)   SpO2 99%   BMI 40.70 kg/m?  ? ?Wt Readings from Last 3 Encounters:  ?11/13/21 (!) 143.8 kg (317 lb)  ?11/13/21 (!) 143.8 kg (317 lb)  ?10/16/21 (!) 144.5 kg (318 lb 9.6 oz)  ? ?PHYSICAL EXAM: ?General:  NAD. No resp difficulty ?HEENT: Normal ?Neck: Supple. No JVD. Carotids 2+ bilat; no bruits. No lymphadenopathy or thryomegaly appreciated. ?Cor: PMI nondisplaced. Slow rate & rhythm. No rubs, gallops or murmurs. ?Lungs: Clear ?Abdomen: Obese, nontender, nondistended. No hepatosplenomegaly. No bruits or masses. Good bowel sounds. ?Extremities: No cyanosis, clubbing, rash, edema ?Neuro: Alert & oriented x 3, cranial nerves grossly intact. Moves all 4 extremities w/o difficulty. Affect pleasant. ? ?ECG: SB, QRS 146 msec (personally reviewed). ? ?ASSESSMENT & PLAN: ?1. Chronic HFpEF, HCM ?- Echo 2018 EF 50-55% ?- Echo 09/05/21 EF 40-45% (DB thought 50%) + LVH ?- May need ischemic work up. ?- cMRI  (1/23): LVEF 35%, RVEF 42%, study consistent w HCM. ?- Will need CPX down the road and first degree relatives will need genetic testing. ?- He has been referred to Dr. Broadus John for genetic testing. ?- Improved N

## 2021-11-13 ENCOUNTER — Encounter (HOSPITAL_COMMUNITY): Payer: Self-pay

## 2021-11-13 ENCOUNTER — Encounter: Payer: Self-pay | Admitting: Interventional Cardiology

## 2021-11-13 ENCOUNTER — Ambulatory Visit (HOSPITAL_COMMUNITY)
Admission: RE | Admit: 2021-11-13 | Discharge: 2021-11-13 | Disposition: A | Discharge status: Home / Self Care | Payer: 59 | Source: Ambulatory Visit | Attending: Family Medicine | Admitting: Family Medicine

## 2021-11-13 ENCOUNTER — Ambulatory Visit (INDEPENDENT_AMBULATORY_CARE_PROVIDER_SITE_OTHER): Payer: 59 | Admitting: Interventional Cardiology

## 2021-11-13 ENCOUNTER — Other Ambulatory Visit: Payer: Self-pay

## 2021-11-13 VITALS — BP 110/80 | HR 53 | Wt 317.0 lb

## 2021-11-13 VITALS — BP 110/74 | HR 64 | Ht 74.0 in | Wt 317.0 lb

## 2021-11-13 DIAGNOSIS — F1010 Alcohol abuse, uncomplicated: Secondary | ICD-10-CM | POA: Diagnosis not present

## 2021-11-13 DIAGNOSIS — Z79899 Other long term (current) drug therapy: Secondary | ICD-10-CM | POA: Insufficient documentation

## 2021-11-13 DIAGNOSIS — E669 Obesity, unspecified: Secondary | ICD-10-CM | POA: Diagnosis not present

## 2021-11-13 DIAGNOSIS — I110 Hypertensive heart disease with heart failure: Secondary | ICD-10-CM

## 2021-11-13 DIAGNOSIS — I480 Paroxysmal atrial fibrillation: Secondary | ICD-10-CM | POA: Insufficient documentation

## 2021-11-13 DIAGNOSIS — Z9989 Dependence on other enabling machines and devices: Secondary | ICD-10-CM | POA: Insufficient documentation

## 2021-11-13 DIAGNOSIS — Z6841 Body Mass Index (BMI) 40.0 and over, adult: Secondary | ICD-10-CM | POA: Diagnosis not present

## 2021-11-13 DIAGNOSIS — F17210 Nicotine dependence, cigarettes, uncomplicated: Secondary | ICD-10-CM | POA: Insufficient documentation

## 2021-11-13 DIAGNOSIS — Z720 Tobacco use: Secondary | ICD-10-CM | POA: Diagnosis not present

## 2021-11-13 DIAGNOSIS — I5032 Chronic diastolic (congestive) heart failure: Secondary | ICD-10-CM | POA: Diagnosis present

## 2021-11-13 DIAGNOSIS — I422 Other hypertrophic cardiomyopathy: Secondary | ICD-10-CM | POA: Diagnosis not present

## 2021-11-13 DIAGNOSIS — I4891 Unspecified atrial fibrillation: Secondary | ICD-10-CM

## 2021-11-13 DIAGNOSIS — Z9884 Bariatric surgery status: Secondary | ICD-10-CM | POA: Insufficient documentation

## 2021-11-13 DIAGNOSIS — Z7984 Long term (current) use of oral hypoglycemic drugs: Secondary | ICD-10-CM | POA: Insufficient documentation

## 2021-11-13 DIAGNOSIS — I5031 Acute diastolic (congestive) heart failure: Secondary | ICD-10-CM

## 2021-11-13 DIAGNOSIS — I10 Essential (primary) hypertension: Secondary | ICD-10-CM | POA: Diagnosis not present

## 2021-11-13 DIAGNOSIS — Z7901 Long term (current) use of anticoagulants: Secondary | ICD-10-CM | POA: Diagnosis not present

## 2021-11-13 DIAGNOSIS — G4733 Obstructive sleep apnea (adult) (pediatric): Secondary | ICD-10-CM | POA: Diagnosis not present

## 2021-11-13 MED ORDER — ENTRESTO 49-51 MG PO TABS
1.0000 | ORAL_TABLET | Freq: Two times a day (BID) | ORAL | 8 refills | Status: DC
Start: 2021-11-13 — End: 2022-04-25

## 2021-11-13 NOTE — Patient Instructions (Signed)
Medication Instructions:  ?Your physician recommends that you continue on your current medications as directed. Please refer to the Current Medication list given to you today. ? ?*If you need a refill on your cardiac medications before your next appointment, please call your pharmacy* ? ? ?Lab Work: ?none ?If you have labs (blood work) drawn today and your tests are completely normal, you will receive your results only by: ?MyChart Message (if you have MyChart) OR ?A paper copy in the mail ?If you have any lab test that is abnormal or we need to change your treatment, we will call you to review the results. ? ? ?Testing/Procedures: ?none ? ? ?Follow-Up: ?At [facility], you and your health needs are our priority.  As part of our continuing mission to provide you with exceptional heart care, we have created designated Provider Care Teams.  These Care Teams include your primary Cardiologist (physician) and Advanced Practice Providers (APPs -  Physician Assistants and Nurse Practitioners) who all work together to provide you with the care you need, when you need it. ? ?We recommend signing up for the patient portal called "MyChart".  Sign up information is provided on this After Visit Summary.  MyChart is used to connect with patients for Virtual Visits (Telemedicine).  Patients are able to view lab/test results, encounter notes, upcoming appointments, etc.  Non-urgent messages can be sent to your provider as well.   ?To learn more about what you can do with MyChart, go to [email or web address].   ? ?Your next appointment:   ?As needed ? ?The format for your next appointment:   ?In Person ? ?Provider:   ?Larae Grooms, MD   ? ? ?Other Instructions ?Continue to follow with CHF clinic ? ?

## 2021-11-13 NOTE — Progress Notes (Signed)
?  ?Cardiology Office Note ? ? ?Date:  11/13/2021  ? ?ID:  Chad Lyons, DOB [birth year], MRN [id number] ? ?PCP:  [name], NP  ? ? ?Chief Complaint  ?Patient presents with  ? Establish Care  ? ? ? ?Wt Readings from Last 3 Encounters:  ?11/13/21 (!) 317 lb (143.8 kg)  ?10/16/21 (!) 318 lb 9.6 oz (144.5 kg)  ?10/03/21 (!) 327 lb 12.8 oz (148.7 kg)  ?  ? ?  ?History of Present Illness: ?CAS TRACZ is a 56 y.o. male  who I saw back in 2020.  Since that he followed up with the heart failure clinic.  Records show: "With history of HFpEF, diverticulitis, HTN, S/P Lap Band, tobacco abuse, and OSA (uses CPAP), and new diagnosis of hypertrophic cardiomyopathy. ?  ?Echo in 2018 EF 50-55%.  ?  ?Admitted 1/23 with a/c CHF. He was diuresed with IV lasix. Echo showed EF 40-45%, +LVH (Dr. Haroldine Laws read as 50%). cMRI showed LVEF 35%, moderate asymmetric septal hypertrophy, no LVOT, RVEF 42%, findings consistent with hypertrophic cardiomyopathy. He was discharged on GDMT with spiro, Lasix, carvedilol and Entresto; discharge weight 325 lbs. ?  ?Hospital follow up up he was in Afib, CVR and volume overloaded. Amio and Eliquis started, DCCV arranged and Lasix increased. ?  ?DCCV 09/23/21 to NSR. ?  ?At HF follow up in 3/2023with his wife:" Overall feeling better. He is not SOB running up and down his stairs now. Has been making dietary changes and noticing weight is coming down.  Denies palpitations, abnormal  bleeding, CP, dizziness, edema, or PND/Orthopnea. Appetite ok. No fever or chills. Weight at home 313 pounds. Taking all medications. Wearing CPAP nightly. Finished Chantix, almost cut smoking out completely. Drinking ETOH 1 drink/night." ? ?F/u was arranged with Dr.Bensimhon for summer 2023.   ? ?Occasional nosebleed after starting Eliquis for AFib a few months ago.  Stopped within 10-15 minutes. ? ?Denies : Chest pain. Dizziness. Leg edema. Nitroglycerin use. Orthopnea. Palpitations. Paroxysmal nocturnal dyspnea.  Shortness of breath. Syncope.   ? ?Walks daily at work.  6-7K steps daily.  Does not do any dedicated walking other than what he does at work. ? ? ? ?Past Medical History:  ?Diagnosis Date  ? Diverticulitis   ? HX OF  ? EKG, abnormal   ? HX OF LEFT ANTERIO FASCICULAR BLOCK ON 02-15-15 EKG  ? GERD (gastroesophageal reflux disease)   ? Heart murmur   ? NOW RESOLVED  ? Hypertension   ? OSA (obstructive sleep apnea) 11/27/2016  ? Ventral incisional hernia   ? ? ?Past Surgical History:  ?Procedure Laterality Date  ? CARDIOVERSION N/A 10/03/2021  ? Procedure: CARDIOVERSION;  Surgeon: Jolaine Artist, MD;  Location[facility] ENDOSCOPY;  Service: Cardiovascular;  Laterality: N/A;  ? COLON SURGERY  2006  ? colon resection for polyps  ? COLONOSCOPY WITH PROPOFOL N/A 10/31/2016  ? Procedure: COLONOSCOPY WITH PROPOFOL;  Surgeon: Mauri Pole, MD;  Location: [facility] ENDOSCOPY;  Service: Endoscopy;  Laterality: N/A;  ? HIATAL HERNIA REPAIR  1991  ? INSERTION OF MESH N/A 02/20/2015  ? Procedure: INSERTION OF MESH;  Surgeon: Excell Seltzer, MD;  Location: [facility] ORS;  Service: General;  Laterality: N/A;  ? [place] GASTRIC BANDING  2011  ? VENTRAL HERNIA REPAIR N/A 02/20/2015  ? Procedure: OPEN REPAIR VENTRAL INCISIONAL HERNIA;  Surgeon: Excell Seltzer, MD;  Location: [facility] ORS;  Service: General;  Laterality: N/A;  ? WISDOM TOOTH EXTRACTION    ? ? ? ?Current  Outpatient Medications  ?Medication Sig Dispense Refill  ? acetaminophen (TYLENOL) 500 MG tablet Take 1,000 mg by mouth every 6 (six) hours as needed for headache (pain).    ? ALPRAZolam (XANAX) 0.5 MG tablet Take 1 tablet (0.5 mg total) by mouth daily as needed. 90 tablet 0  ? amiodarone (PACERONE) 200 MG tablet Take 0.5 tablets (100 mg total) by mouth daily. 30 tablet 2  ? apixaban (ELIQUIS) 5 MG TABS tablet Take 1 tablet (5 mg total) by mouth 2 (two) times daily. 60 tablet 6  ? b complex vitamins capsule Take 1 capsule by mouth daily.    ? carvedilol (COREG) 3.125 MG tablet Take  1 tablet (3.125 mg total) by mouth 2 (two) times daily with a meal. 60 tablet 3  ? Cholecalciferol (VITAMIN D3 PO) Take 1 capsule by mouth daily.    ? dapagliflozin propanediol (FARXIGA) 10 MG TABS tablet Take 1 tablet (10 mg total) by mouth daily before breakfast. 30 tablet 5  ? fluticasone (FLONASE) 50 MCG/ACT nasal spray Place 2 sprays into both nostrils daily. (Patient taking differently: Place 2 sprays into both nostrils daily as needed for allergies.) 48 g 1  ? furosemide (LASIX) 40 MG tablet Take 2 tablets (80 mg total) by mouth every morning AND 1 tablet (40 mg total) every evening. 90 tablet 3  ? Multiple Vitamin (MULTIVITAMIN WITH MINERALS) TABS tablet Take 1 tablet by mouth every morning.    ? polyvinyl alcohol (LIQUIFILM TEARS) 1.4 % ophthalmic solution Place 1 drop into both eyes every morning. Ro[place]o eye drops for dry eyes    ? potassium chloride SA (KLOR-CON M) 20 MEQ tablet Take 1 tablet (20 mEq total) by mouth 2 (two) times daily. 90 tablet 1  ? sacubitril-valsartan (ENTRESTO) 49-51 MG Take 1 tablet by mouth 2 (two) times daily. 60 tablet 3  ? spironolactone (ALDACTONE) 25 MG tablet Take 1/2 tablet (12.5 mg total) by mouth daily. 15 tablet 3  ? triamcinolone cream (KENALOG) 0.1 % Apply topically 2 times daily as needed (rash). 30 g 0  ? varenicline (CHANTIX) 1 MG tablet Take 1 tablet (1 mg total) by mouth 2 (two) times daily. 60 tablet 3  ? ?No current facility-administered medications for this visit.  ? ? ?Allergies:   Hydromet [hydrocodone bit-homatrop mbr], Iohexol, Iodine-131, Penicillins, and Red dye  ? ? ?Social History:  The patient  reports that he has quit smoking. His smoking use included cigarettes. He smoked an average of .5 packs per day. He has never used smokeless tobacco. He reports current alcohol use of about 40.0 standard drinks per week. He reports that he does not use drugs.  ? ?Family History:  The patient's family history includes Cancer in his maternal uncle; Diabetes in his  mother; Heart disease in his maternal aunt, maternal uncle, and mother; Heart failure in his brother; High blood pressure in his maternal grandmother and mother; Hypertension in his brother; Rheum arthritis in his maternal grandmother; Stroke in his brother.  ? ? ?ROS:  Please see the history of present illness.   Otherwise, review of systems are positive for occasional nosebleed.   All other systems are reviewed and negative.  ? ? ?PHYSICAL EXAM: ?VS:  BP 110/74   Pulse 64   Ht '6\' 2"'$  (1.88 m)   Wt (!) 317 lb (143.8 kg)   SpO2 98%   BMI 40.70 kg/m?  , BMI Body mass index is 40.7 kg/m?. ?GEN: Well nourished, well developed, in no acute  distress ?HEENT: normal ?Neck: no JVD, carotid bruits, or masses ?Cardiac: RRR; no murmurs, rubs, or gallops,no edema  ?Respiratory:  clear to auscultation bilaterally, normal work of breathing ?GI: soft, nontender, nondistended, + BS, obese ?MS: no deformity or atrophy ?Skin: warm and dry, no rash ?Neuro:  Strength and sensation are intact ?Psych: euthymic mood, full affect ? ? ? ? ?Recent Labs: ?09/12/2021: ALT 34 ?09/13/2021: TSH 1.307 ?10/16/2021: B Natriuretic Peptide 132.2; BUN 18; Creatinine, Ser 1.19; Hemoglobin 17.2; Platelets 225; Potassium 4.7; Sodium 135  ? ?Lipid Panel ?   ?Component Value Date/Time  ? CHOL 112 11/22/2020 0739  ? TRIG 106.0 11/22/2020 0739  ? HDL 41.80 11/22/2020 0739  ? CHOLHDL 3 11/22/2020 0739  ? VLDL 21.2 11/22/2020 0739  ? [place] 49 11/22/2020 0739  ? ?  ?Other studies Reviewed: ?Additional studies/ records that were reviewed today with results demonstrating: records reviewed. ? ? ?ASSESSMENT AND PLAN: ? ?Chronic diastolic heart failure: Appears euvolemic.  The current medical regimen is effective;  continue present plan and medications.   ?Hypertensive heart disease: The current medical regimen is effective;  continue present plan and medications. 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  No lightheadedness or syncope.   ?Using chantix to stop smoking. ? ? ?Current  medicines are reviewed at length with the patient today.  The patient concerns regarding his medicines were addressed. ? ?The following changes have been made:  No change ? ?Labs/ tests ordered today include:  ?No o

## 2021-11-13 NOTE — Patient Instructions (Signed)
Thank you for coming in today ? ?Your physician recommends that you schedule a follow-up appointment in:  ?Please keep follow up appointment with Dr. Haroldine Laws ? ?STOP Amiodarone ? ?At the [facility], you and your health needs are our priority. As part of our continuing mission to provide you with exceptional heart care, we have created designated Provider Care Teams. These Care Teams include your primary Cardiologist (physician) and Advanced Practice Providers (APPs- Physician Assistants and Nurse Practitioners) who all work together to provide you with the care you need, when you need it.  ? ?You may see any of the following providers on your designated Care Team at your next follow up: ?Dr Glori Bickers ?Dr Loralie Champagne ?Darrick Grinder, NP ?Lyda Jester, PA ?Jessica Milford,NP ?Marlyce Huge, PA ?Audry Riles, PharmD ? ? ?Please be sure to bring in all your medications bottles to every appointment.  ? ?If you have any questions or concerns before your next appointment please send [place] a message through [place] or call our office at [phone].   ? ?TO LEAVE A MESSAGE FOR THE NURSE SELECT OPTION 2, PLEASE LEAVE A MESSAGE INCLUDING: ?YOUR NAME ?DATE OF BIRTH ?CALL BACK NUMBER ?REASON FOR CALL**this is important as we prioritize the call backs ? ?YOU WILL RECEIVE A CALL BACK THE SAME DAY AS LONG AS YOU CALL BEFORE 4:00 PM ? ?

## 2022-01-06 ENCOUNTER — Other Ambulatory Visit: Payer: Self-pay | Admitting: Adult Health

## 2022-01-06 ENCOUNTER — Other Ambulatory Visit (HOSPITAL_COMMUNITY): Payer: Self-pay | Admitting: Family Medicine

## 2022-01-28 ENCOUNTER — Ambulatory Visit (HOSPITAL_COMMUNITY): Payer: 59

## 2022-01-28 ENCOUNTER — Encounter (HOSPITAL_COMMUNITY): Payer: 59 | Admitting: Internal Medicine

## 2022-02-08 ENCOUNTER — Other Ambulatory Visit: Payer: Self-pay | Admitting: Adult Health

## 2022-02-08 DIAGNOSIS — G4700 Insomnia, unspecified: Secondary | ICD-10-CM

## 2022-02-10 ENCOUNTER — Other Ambulatory Visit (HOSPITAL_COMMUNITY): Payer: Self-pay

## 2022-02-10 MED ORDER — CARVEDILOL 3.125 MG PO TABS: 3.1250 mg | ORAL_TABLET | Freq: Two times a day (BID) | ORAL | 3 refills | Status: DC

## 2022-02-10 NOTE — Telephone Encounter (Signed)
Meds ordered this encounter  Medications   carvedilol (COREG) 3.125 MG tablet    Sig: Take 1 tablet (3.125 mg total) by mouth 2 (two) times daily with a meal.    Dispense:  180 tablet    Refill:  3

## 2022-02-12 MED ORDER — ALPRAZOLAM 0.5 MG PO TABS: 0.5000 mg | ORAL_TABLET | Freq: Every day | ORAL | 0 refills | Status: DC | PRN

## 2022-02-12 MED ORDER — TRIAMCINOLONE ACETONIDE 0.1 % EX CREA: 1.0000 | TOPICAL_CREAM | Freq: Two times a day (BID) | CUTANEOUS | 0 refills | Status: DC | PRN

## 2022-03-14 ENCOUNTER — Other Ambulatory Visit (HOSPITAL_COMMUNITY): Payer: Self-pay | Admitting: Family Medicine

## 2022-03-25 ENCOUNTER — Other Ambulatory Visit (HOSPITAL_COMMUNITY): Payer: Self-pay

## 2022-03-25 MED ORDER — POTASSIUM CHLORIDE CRYS ER 20 MEQ PO TBCR: 20.0000 meq | EXTENDED_RELEASE_TABLET | Freq: Two times a day (BID) | ORAL | 1 refills | Status: DC

## 2022-03-29 ENCOUNTER — Other Ambulatory Visit: Payer: Self-pay | Admitting: Adult Health

## 2022-04-12 ENCOUNTER — Other Ambulatory Visit (HOSPITAL_COMMUNITY): Payer: Self-pay | Admitting: Family Medicine

## 2022-04-22 ENCOUNTER — Other Ambulatory Visit (HOSPITAL_COMMUNITY): Payer: Self-pay | Admitting: Family Medicine

## 2022-04-25 ENCOUNTER — Ambulatory Visit (HOSPITAL_BASED_OUTPATIENT_CLINIC_OR_DEPARTMENT_OTHER)
Admission: RE | Admit: 2022-04-25 | Discharge: 2022-04-25 | Disposition: A | Discharge status: Home / Self Care | Payer: 59 | Source: Ambulatory Visit | Attending: Internal Medicine | Admitting: Internal Medicine

## 2022-04-25 ENCOUNTER — Encounter (HOSPITAL_COMMUNITY): Payer: Self-pay | Admitting: Internal Medicine

## 2022-04-25 ENCOUNTER — Ambulatory Visit (HOSPITAL_COMMUNITY)
Admission: RE | Admit: 2022-04-25 | Discharge: 2022-04-25 | Disposition: A | Discharge status: Home / Self Care | Payer: 59 | Source: Ambulatory Visit | Attending: Family Medicine | Admitting: Family Medicine

## 2022-04-25 ENCOUNTER — Other Ambulatory Visit (HOSPITAL_COMMUNITY): Payer: Self-pay | Admitting: Internal Medicine

## 2022-04-25 ENCOUNTER — Ambulatory Visit (HOSPITAL_COMMUNITY)
Admission: RE | Admit: 2022-04-25 | Discharge: 2022-04-25 | Disposition: A | Discharge status: Home / Self Care | Payer: 59 | Source: Ambulatory Visit | Attending: Internal Medicine | Admitting: Internal Medicine

## 2022-04-25 VITALS — BP 130/80 | HR 53 | Wt 320.0 lb

## 2022-04-25 DIAGNOSIS — Z6841 Body Mass Index (BMI) 40.0 and over, adult: Secondary | ICD-10-CM | POA: Insufficient documentation

## 2022-04-25 DIAGNOSIS — E669 Obesity, unspecified: Secondary | ICD-10-CM | POA: Insufficient documentation

## 2022-04-25 DIAGNOSIS — Z87891 Personal history of nicotine dependence: Secondary | ICD-10-CM | POA: Insufficient documentation

## 2022-04-25 DIAGNOSIS — Z9884 Bariatric surgery status: Secondary | ICD-10-CM | POA: Insufficient documentation

## 2022-04-25 DIAGNOSIS — I422 Other hypertrophic cardiomyopathy: Secondary | ICD-10-CM

## 2022-04-25 DIAGNOSIS — I480 Paroxysmal atrial fibrillation: Secondary | ICD-10-CM | POA: Diagnosis not present

## 2022-04-25 DIAGNOSIS — G4733 Obstructive sleep apnea (adult) (pediatric): Secondary | ICD-10-CM | POA: Insufficient documentation

## 2022-04-25 DIAGNOSIS — Z79899 Other long term (current) drug therapy: Secondary | ICD-10-CM | POA: Insufficient documentation

## 2022-04-25 DIAGNOSIS — I4891 Unspecified atrial fibrillation: Secondary | ICD-10-CM | POA: Diagnosis not present

## 2022-04-25 DIAGNOSIS — I110 Hypertensive heart disease with heart failure: Secondary | ICD-10-CM | POA: Insufficient documentation

## 2022-04-25 DIAGNOSIS — R002 Palpitations: Secondary | ICD-10-CM

## 2022-04-25 DIAGNOSIS — Z720 Tobacco use: Secondary | ICD-10-CM | POA: Diagnosis not present

## 2022-04-25 DIAGNOSIS — I5031 Acute diastolic (congestive) heart failure: Secondary | ICD-10-CM | POA: Diagnosis not present

## 2022-04-25 DIAGNOSIS — I5022 Chronic systolic (congestive) heart failure: Secondary | ICD-10-CM | POA: Diagnosis not present

## 2022-04-25 DIAGNOSIS — I272 Pulmonary hypertension, unspecified: Secondary | ICD-10-CM | POA: Diagnosis not present

## 2022-04-25 DIAGNOSIS — Z7901 Long term (current) use of anticoagulants: Secondary | ICD-10-CM | POA: Diagnosis not present

## 2022-04-25 DIAGNOSIS — Z7984 Long term (current) use of oral hypoglycemic drugs: Secondary | ICD-10-CM | POA: Diagnosis not present

## 2022-04-25 DIAGNOSIS — F1010 Alcohol abuse, uncomplicated: Secondary | ICD-10-CM | POA: Insufficient documentation

## 2022-04-25 DIAGNOSIS — Z9989 Dependence on other enabling machines and devices: Secondary | ICD-10-CM

## 2022-04-25 DIAGNOSIS — I5032 Chronic diastolic (congestive) heart failure: Secondary | ICD-10-CM

## 2022-04-25 LAB — BASIC METABOLIC PANEL
Anion gap: 10 (ref 5–15)
BUN: 18 mg/dL (ref 6–20)
CO2: 24 mmol/L (ref 22–32)
Calcium: 9.2 mg/dL (ref 8.9–10.3)
Chloride: 104 mmol/L (ref 98–111)
Creatinine, Ser: 1.2 mg/dL (ref 0.61–1.24)
GFR, Estimated: >60 mL/min (ref >60)
Glucose, Bld: 96 mg/dL (ref 70–99)
Potassium: 4.2 mmol/L (ref 3.5–5.1)
Sodium: 138 mmol/L (ref 135–145)

## 2022-04-25 LAB — ECHOCARDIOGRAM COMPLETE
AR max vel: 4.05 cm2
AV Peak grad: 5.6 mmHg
Ao pk vel: 1.19 m/s
Area-P 1/2: 3.53 cm2
S' Lateral: 4.2 cm

## 2022-04-25 LAB — BRAIN NATRIURETIC PEPTIDE: B Natriuretic Peptide: 199.2 pg/mL — ABNORMAL HIGH (ref 0.0–100.0)

## 2022-04-25 MED ORDER — ENTRESTO 97-103 MG PO TABS
1.0000 | ORAL_TABLET | Freq: Two times a day (BID) | ORAL | 6 refills | Status: DC
Start: 2022-04-25 — End: 2022-12-12

## 2022-04-25 MED ORDER — PREDNISONE 50 MG PO TABS
50.0000 mg | ORAL_TABLET | ORAL | 0 refills | Status: DC
Start: 2022-04-25 — End: 2022-05-20

## 2022-04-25 MED ORDER — PERFLUTREN LIPID MICROSPHERE
1.0000 mL | INTRAVENOUS | Status: DC | PRN
  Administered 2022-04-25: 4 mL via INTRAVENOUS

## 2022-04-25 NOTE — Addendum Note (Signed)
Encounter addended by: Scarlette Calico, RN on: 04/25/2022 11:28 AM  Actions taken: Order list changed, Diagnosis association updated, Pend clinical note, Clinical Note Signed, Charge Capture section accepted

## 2022-04-25 NOTE — Patient Instructions (Signed)
Medication Changes:  Increase Entresto to 97/103 mg Twice daily   Lab Work:  Labs done today, your results will be available in MyChart, we will contact you for abnormal readings.  Testing/Procedures:  Your provider has recommended that  you wear a Zio Patch for 14 days.  This monitor will record your heart rhythm for our review.  IF you have any symptoms while wearing the monitor please press the button.  If you have any issues with the patch or you notice a red or orange light on it please call the company at [phone].  Once you remove the patch please mail it back to the company as soon as possible so we can get the results.  Your physician has recommended that you have a cardiopulmonary stress test (CPX). CPX testing is a non-invasive measurement of heart and lung function. It replaces a traditional treadmill stress test. This type of test provides a tremendous amount of information that relates not only to your present condition but also for future outcomes. This test combines measurements of you ventilation, respiratory gas exchange in the lungs, electrocardiogram (EKG), blood pressure and physical response before, during, and following an exercise protocol.  Your physician has requested that you have cardiac CT. Cardiac computed tomography (CT) is a painless test that uses an x-ray machine to take clear, detailed pictures of your heart. For further information please visit HugeFiesta.[email or web address]. Please follow instruction sheet as given. ONCE APPROVED BY INSURANCE YOU WILL BE CALLED TO SCHEDULE   Referrals:  You have been referred to Dr Broadus John, genetic counselor, they will call you to schedule an appointment  Special Instructions // Education:  Do the following things EVERYDAY: Weigh yourself in the morning before breakfast. Write it down and keep it in a log. Take your medicines as prescribed Eat low salt foods--Limit salt (sodium) to 2000 mg per day.  Stay as active as you can  everyday Limit all fluids for the day to less than 2 liters   Follow-Up in: 6 months (March 2024), **PLEASE CALL OUR OFFICE IN [place] TO SCHEDULE THIS APPOINTMENT  At [facility], you and your health needs are our priority. We have a designated team specialized in the treatment of Heart Failure. This Care Team includes your primary Heart Failure Specialized Cardiologist (physician), Advanced Practice Providers (APPs- Physician Assistants and Nurse Practitioners), and Pharmacist who all work together to provide you with the care you need, when you need it.   You may see any of the following providers on your designated Care Team at your next follow up:  Dr. Glori Bickers Dr. Loralie Champagne Dr. Roxana Hires, NP Lyda Jester, [place] [facility] [place], [place] Forestine Na, NP Audry Riles, PharmD   Please be sure to bring in all your medications bottles to every appointment.   Need to Contact us:  If you have any questions or concerns before your next appointment please send [place] a message through [place] or call our office at [phone].    TO LEAVE A MESSAGE FOR THE NURSE SELECT OPTION 2, PLEASE LEAVE A MESSAGE INCLUDING: YOUR NAME DATE OF BIRTH CALL BACK NUMBER REASON FOR CALL**this is important as we prioritize the call backs  YOU WILL RECEIVE A CALL BACK THE SAME DAY AS LONG AS YOU CALL BEFORE 4:00 PM     Your cardiac CT will be scheduled at one of the below locations:   [facility] [address] [place], [place] [address] [phone]  [phone]   If scheduled at [facility], please arrive at the [facility] and Children's Entrance (Entrance C2) of [facility] 30 minutes prior to test start time. You can use the FREE valet parking offered at entrance C (encouraged to control the heart rate for the test)  Proceed to the [facility] Radiology Department (first floor) to check-in and test prep.  All  radiology patients and guests should use entrance C2 at [facility], accessed from [facility], even though the hospital's physical address listed is [address].     Please follow these instructions carefully (unless otherwise directed):  Hold all erectile dysfunction medications at least 3 days (72 hrs) prior to test. (Ie viagra, cialis, sildenafil, tadalafil, etc) We will administer nitroglycerin during this exam.   On the Night Before the Test: Be sure to Drink plenty of water. Do not consume any caffeinated/decaffeinated beverages or chocolate 12 hours prior to your test. Do not take any antihistamines 12 hours prior to your test. If the patient has contrast allergy: Patient will need a prescription for Prednisone and very clear instructions (as follows): Prednisone 50 mg - take 13 hours prior to test Take another Prednisone 50 mg 7 hours prior to test Take another Prednisone 50 mg 1 hour prior to test Take Benadryl 50 mg 1 hour prior to test Patient must complete all four doses of above prophylactic medications. Patient will need a ride after test due to Benadryl.  On the Day of the Test: Drink plenty of water until 1 hour prior to the test. You may take your regular medications prior to the test.  Take metoprolol (Lopressor) two hours prior to test. HOLD Furosemide/Hydrochlorothiazide morning of the test.      After the Test: Drink plenty of water. After receiving IV contrast, you may experience a mild flushed feeling. This is normal. On occasion, you may experience a mild rash up to 24 hours after the test. This is not dangerous. If this occurs, you can take Benadryl 25 mg and increase your fluid intake. If you experience trouble breathing, this can be serious. If it is severe call [phone] IMMEDIATELY. If it is mild, please call our office.  We will call to schedule your test 2-4 weeks out understanding that some insurance companies will need an  authorization prior to the service being performed.   For non-scheduling related questions, please contact the cardiac imaging nurse navigator should you have any questions/concerns: Marchia Bond, Cardiac Imaging Nurse Navigator Gordy Clement, Cardiac Imaging Nurse Navigator [facility] Direct Office Dial: [phone]   For scheduling needs, including cancellations and rescheduling, please call [place], [phone].

## 2022-04-25 NOTE — Progress Notes (Addendum)
ADVANCED HF CLINIC CONSULT NOTE   Primary Care: [name], NP HF Cardiologist: Dr. Haroldine Laws  HPI: Mr Imhof is a 56 y.o.with history of HFpEF, diverticulitis, HTN, S/P Lap Band, tobacco abuse, and OSA (uses CPAP), and new diagnosis of hypertrophic cardiomyopathy.   Echo in 2018 EF 50-55%.    Admitted 1/23 with a/c CHF. He was diuresed with IV lasix. Echo showed EF 40-45%, +LVH (Dr. Haroldine Laws read as 50%). cMRI showed LVEF 35%, moderate asymmetric septal hypertrophy, no LVOT, RVEF 42%, findings consistent with hypertrophic cardiomyopathy. He was discharged on GDMT with spiro, Lasix, carvedilol and Entresto; discharge weight 325 lbs.  Hospital follow up up he was in Afib, CVR and volume overloaded. Amio and Eliquis started, DCCV arranged and Lasix increased.  DCCV 09/23/21 to NSR.  Today he returns for HF follow up with his wife. Doing very well. Has made many lifestyle changes. Quit smoking. Watching diet. Walking more 9-10K steps/day. No SOB, CP, edema. Occasional palpitations. No syncope or presyncope.   Echo today 04/25/22: EF 40-45% Severe concentric LVH G1DD Asc Ao 4.5 cm Personally reviewed  FH: mother has Afib, brother has MI and AF  Cardiac Studies: - Echo (1/23): EF 40-45%, + LVH  - cMRI (1/23): LVEF 35%, RVEF 42%, study consistent w HCM.   Past Medical History:  Diagnosis Date   Diverticulitis    HX OF   EKG, abnormal    HX OF LEFT ANTERIO FASCICULAR BLOCK ON 02-15-15 EKG   GERD (gastroesophageal reflux disease)    Heart murmur    NOW RESOLVED   Hypertension    OSA (obstructive sleep apnea) 11/27/2016   Ventral incisional hernia    Current Outpatient Medications  Medication Sig Dispense Refill   acetaminophen (TYLENOL) 500 MG tablet Take 1,000 mg by mouth every 6 (six) hours as needed for headache (pain).     ALPRAZolam (XANAX) 0.5 MG tablet Take 1 tablet (0.5 mg total) by mouth daily as needed. 90 tablet 0   b complex vitamins capsule Take 1 capsule by mouth  daily.     carvedilol (COREG) 3.125 MG tablet Take 1 tablet (3.125 mg total) by mouth 2 (two) times daily with a meal. 180 tablet 3   Cholecalciferol (VITAMIN D3 PO) Take 1 capsule by mouth daily.     ELIQUIS 5 MG TABS tablet TAKE 1 TABLET BY MOUTH TWICE DAILY 60 tablet 11   FARXIGA 10 MG TABS tablet TAKE 1 TABLET BY MOUTH EACH MORNING BEFORE BREAKFAST 30 tablet 11   fluticasone (FLONASE) 50 MCG/ACT nasal spray Place 2 sprays into both nostrils daily. (Patient taking differently: Place 2 sprays into both nostrils daily as needed for allergies.) 48 g 1   furosemide (LASIX) 40 MG tablet TAKE ONE TABLET BY MOUTH TWICE DAILY (Patient taking differently: Take 40 mg by mouth every morning. '20mg'$  every evening) 180 tablet 0   Multiple Vitamin (MULTIVITAMIN WITH MINERALS) TABS tablet Take 1 tablet by mouth every morning.     potassium chloride SA (KLOR-CON M) 20 MEQ tablet Take 1 tablet (20 mEq total) by mouth 2 (two) times daily. 90 tablet 1   sacubitril-valsartan (ENTRESTO) 49-51 MG Take 1 tablet by mouth 2 (two) times daily. 60 tablet 8   spironolactone (ALDACTONE) 25 MG tablet TAKE 1/2 TABLET BY MOUTH DAILY 15 tablet 11   triamcinolone cream (KENALOG) 0.1 % Apply topically 2 times daily as needed (rash). 30 g 0   varenicline (CHANTIX) 1 MG tablet Take 1 mg by mouth 2 (  two) times daily.     No current facility-administered medications for this encounter.   Facility-Administered Medications Ordered in Other Encounters  Medication Dose Route Frequency Provider Last Rate Last Admin   perflutren lipid microspheres (DEFINITY) IV suspension  1-10 mL Intravenous PRN Rafael Bihari, FNP   4 mL at 04/25/22 4709   Allergies  Allergen Reactions   Hydromet [Hydrocodone Bit-Homatrop Mbr] Itching   Iohexol Hives and Itching    PT DEVELOPS ITCHING AND HIVES SEVERAL HOURS AFTER INJECTION OF OMNIPAQUE    Iodine-131 Itching   Penicillins Itching    Has patient had a PCN reaction causing immediate rash,  facial/tongue/throat swelling, SOB or lightheadedness with hypotension: Yes Has patient had a PCN reaction causing severe rash involving mucus membranes or skin necrosis: No Has patient had a PCN reaction that required hospitalization: No Has patient had a PCN reaction occurring within the last 10 years: No If all of the above answers are "NO", then may proceed with Cephalosporin use.    Red Dye Itching   Social History   Socioeconomic History   Marital status: Married    Spouse name: Dimitrius Steedman   Number of children: 1   Years of education: Not on file   Highest education level: Bachelor's degree (e.g., BA, AB, BS)  Occupational History   Occupation: Sales  Tobacco Use   Smoking status: Former    Packs/day: 0.50    Types: Cigarettes   Smokeless tobacco: Never   Tobacco comments:    Current Cigars 1 every other day -----QUIT cigarettes in 2006>> 1/2PPD x 15 years  Vaping Use   Vaping Use: Never used  Substance and Sexual Activity   Alcohol use: Yes    Alcohol/week: 40.0 standard drinks of alcohol    Types: 40 Shots of liquor per week    Comment: 6-7 shots of liquor daily x 8 years. heavier on weekend   Drug use: No   Sexual activity: Not on file    Comment: still smokes cigars  Other Topics Concern   Not on file  Social History Narrative   Not on file   Social Determinants of Health   Financial Resource Strain: Low Risk  (09/05/2021)   Overall Financial Resource Strain (CARDIA)    Difficulty of Paying Living Expenses: Not hard at all  Food Insecurity: No Food Insecurity (09/05/2021)   Hunger Vital Sign    Worried About Running Out of Food in the Last Year: Never true    Ran Out of Food in the Last Year: Never true  Transportation Needs: No Transportation Needs (09/05/2021)   PRAPARE - Hydrologist (Medical): No    Lack of Transportation (Non-Medical): No  Physical Activity: Sufficiently Active (09/03/2021)   Exercise Vital Sign    Days of  Exercise per Week: 5 days    Minutes of Exercise per Session: 30 min  Stress: Stress Concern Present (09/03/2021)   [place]    Feeling of Stress : To some extent  Social Connections: Socially Integrated (09/03/2021)   Social Connection and Isolation Panel [NHANES]    Frequency of Communication with Friends and Family: More than three times a week    Frequency of Social Gatherings with Friends and Family: Once a week    Attends Religious Services: More than 4 times per year    Active Member of [employer] or Organizations: Yes    Attends Archivist Meetings: More  than 4 times per year    Marital Status: Married  Human resources officer Violence: Not on file   Family History  Problem Relation Age of Onset   Heart disease Mother    High blood pressure Mother    Diabetes Mother    Hypertension Brother    Heart failure Brother    Stroke Brother    Heart disease Maternal Aunt    Cancer Maternal Uncle        pancreatic   Heart disease Maternal Uncle    Rheum arthritis Maternal Grandmother    High blood pressure Maternal Grandmother    Colon cancer Neg Hx    BP 130/80   Pulse (!) 53   Wt (!) 145.2 kg (320 lb)   SpO2 100%   BMI 41.09 kg/m   Wt Readings from Last 3 Encounters:  04/25/22 (!) 145.2 kg (320 lb)  11/13/21 (!) 143.8 kg (317 lb)  11/13/21 (!) 143.8 kg (317 lb)   PHYSICAL EXAM: General:  Well appearing. No resp difficulty HEENT: normal Neck: supple. no JVD. Carotids 2+ bilat; no bruits. No lymphadenopathy or thryomegaly appreciated. Cor: PMI nondisplaced. Regular rate & rhythm. No rubs, gallops or murmurs. Lungs: clear Abdomen: obese  soft, nontender, nondistended. No hepatosplenomegaly. No bruits or masses. Good bowel sounds. Extremities: no cyanosis, clubbing, rash, edema Neuro: alert & orientedx3, cranial nerves grossly intact. moves all 4 extremities w/o difficulty. Affect pleasant   ECG: SB 56  IVCD, + LVH One PVC (personally reviewed).   ASSESSMENT & PLAN:  1. Chronic HFmrEF, with HCM - Echo 2018 EF 50-55% - Echo 09/05/21 EF 40-45% + LVH - cMRI (1/23): LVEF 35%, RVEF 42%, study consistent w HCM.  Septal thickness 2.4 cm  - Echo today 04/25/22: EF 40-45% Severe concentric LVH G1DD Asc Ao 4.5 cm No LVOT gradient Personally reviewed - Will need CPX down the road and first degree relatives will need genetic testing. - Refer to Dr. Broadus John for genetic testing. - Improved NYHA I-II - Continue Farxiga 10 mg daily. - Continue Lasix 80 mg bid + KCL 20 bid. - Continue carvedilol 3.125 mg bid. Unable to titrate with low BP - Increase Entresto to 97/103 mg bid. - Continue spiro 12.5 mg daily. 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 - CHADs-VASC: 2 - s/p DCCV 09/23/21--> NSR. - remains in NST. Off amio. If AF recurs -> ablation - Continue Eliquis 5 mg bid. - Continue CPAP.   3. Aortic root dilation - 4.5 cm on echo today. Follow with yearly echo.  - will CTA coronaries and aortic root   4. OSA - Continue CPAP nightly. - Need weight loss.    5.  HTN, severe  - Much improved. - Meds as above  6. Obesity  - S/P lap band in 2012. - Body mass index is 41.09 kg/m. - Consider GLP1RA.   7. Tobacco/ETOH Abuse - Quit 2/23   Total time spent 40 minutes. Over half that time spent discussing above.    Glori Bickers, MD  10:23 AM

## 2022-04-28 ENCOUNTER — Telehealth (HOSPITAL_COMMUNITY): Payer: Self-pay | Admitting: *Deleted

## 2022-04-28 NOTE — Telephone Encounter (Signed)
Cardiac CT auth request faxed

## 2022-05-20 ENCOUNTER — Ambulatory Visit (HOSPITAL_COMMUNITY)
Admission: EM | Admit: 2022-05-20 | Discharge: 2022-05-20 | Disposition: A | Discharge status: Home / Self Care | Payer: 59 | Attending: Family Medicine | Admitting: Family Medicine

## 2022-05-20 ENCOUNTER — Ambulatory Visit (HOSPITAL_COMMUNITY): Payer: 59 | Attending: Internal Medicine

## 2022-05-20 ENCOUNTER — Encounter (HOSPITAL_COMMUNITY): Payer: Self-pay | Admitting: *Deleted

## 2022-05-20 ENCOUNTER — Other Ambulatory Visit (HOSPITAL_COMMUNITY): Payer: Self-pay | Admitting: *Deleted

## 2022-05-20 DIAGNOSIS — I5022 Chronic systolic (congestive) heart failure: Secondary | ICD-10-CM

## 2022-05-20 DIAGNOSIS — I480 Paroxysmal atrial fibrillation: Secondary | ICD-10-CM | POA: Insufficient documentation

## 2022-05-20 DIAGNOSIS — G4733 Obstructive sleep apnea (adult) (pediatric): Secondary | ICD-10-CM | POA: Diagnosis not present

## 2022-05-20 DIAGNOSIS — I110 Hypertensive heart disease with heart failure: Secondary | ICD-10-CM | POA: Insufficient documentation

## 2022-05-20 DIAGNOSIS — F17200 Nicotine dependence, unspecified, uncomplicated: Secondary | ICD-10-CM | POA: Insufficient documentation

## 2022-05-20 DIAGNOSIS — R011 Cardiac murmur, unspecified: Secondary | ICD-10-CM | POA: Diagnosis present

## 2022-05-20 DIAGNOSIS — R0609 Other forms of dyspnea: Secondary | ICD-10-CM | POA: Diagnosis not present

## 2022-05-20 DIAGNOSIS — M25562 Pain in left knee: Secondary | ICD-10-CM | POA: Diagnosis not present

## 2022-05-20 DIAGNOSIS — G8929 Other chronic pain: Secondary | ICD-10-CM | POA: Diagnosis not present

## 2022-05-20 MED ORDER — PREDNISONE 20 MG PO TABS: 40.0000 mg | ORAL_TABLET | Freq: Every day | ORAL | 0 refills | Status: DC

## 2022-05-20 NOTE — ED Triage Notes (Signed)
Pt twisted his left leg and knee about a month ago. He felt his knee went one way and the leg the other way. He came in today because he had a treadmill stress test and he struggled. He has been icing the knee and taking tylenol without relief.

## 2022-05-20 NOTE — Addendum Note (Signed)
Encounter addended by: Micki Riley, RN on: 05/20/2022 1:13 PM  Actions taken: Imaging Exam ended

## 2022-05-20 NOTE — ED Provider Notes (Incomplete)
[facility]   [id number] 05/20/22 Arrival Time: 1030  ASSESSMENT & PLAN:  1. Chronic pain of left knee   With exacerbation. No indication for plain imaging.  Is going to schedule orthopaedic f/u asap. Trial of: New Prescriptions   PREDNISONE (DELTASONE) 20 MG TABLET    Take 2 tablets (40 mg total) by mouth daily.   Activities as tolerated.  Recommend:  Follow-up Information     Bensimhon, Shaune Pascal, MD.   Specialty: Cardiology Why: As needed. Contact information: [address] [place] [address] [phone]                Reviewed expectations re: course of current medical issues. Questions answered. Outlined signs and symptoms indicating need for more acute intervention. Patient verbalized understanding. After Visit Summary given.  SUBJECTIVE: History from: patient. Chad Lyons is a 56 y.o. male who reports that  twisted his left leg and knee about 4-6 w ago. He felt his knee went one way and the leg the other way. He came in today because he had a treadmill stress test and he struggled. He has been icing the knee and taking tylenol without relief. Pain mostly every day. Able to do daily activities. Knee does swell at times. No extremity sensation changes or weakness.   Past Surgical History:  Procedure Laterality Date   CARDIOVERSION N/A 10/03/2021   Procedure: CARDIOVERSION;  Surgeon: Jolaine Artist, MD;  Location: [facility];  Service: Cardiovascular;  Laterality: N/A;   COLON SURGERY  2006   colon resection for polyps   COLONOSCOPY WITH PROPOFOL N/A 10/31/2016   Procedure: COLONOSCOPY WITH PROPOFOL;  Surgeon: Mauri Pole, MD;  Location: [facility];  Service: Endoscopy;  Laterality: N/A;   HIATAL HERNIA REPAIR  1991   INSERTION OF MESH N/A 02/20/2015   Procedure: INSERTION OF MESH;  Surgeon: Excell Seltzer, MD;  Location: [facility];  Service: General;  Laterality: N/A;   LAPAROSCOPIC GASTRIC BANDING  2011    VENTRAL HERNIA REPAIR N/A 02/20/2015   Procedure: OPEN REPAIR VENTRAL INCISIONAL HERNIA;  Surgeon: Excell Seltzer, MD;  Location: [facility];  Service: General;  Laterality: N/A;   WISDOM TOOTH EXTRACTION        OBJECTIVE:  Vitals:   05/20/22 1052  BP: 108/68  Resp: 18  Temp: 98.6 F (37 C)  TempSrc: Oral  SpO2: 96%    General appearance: alert; no distress HEENT: [place]; AT Neck: supple with FROM Resp: unlabored respirations Extremities: LLE: warm with well perfused appearance; poorly localized mild to moderate tenderness over left anterior knee; without gross deformities; swelling: minimal; bruising: none; knee ROM: normal CV: brisk extremity capillary refill of LLE; 2+ DP pulse of LLE. Skin: warm and dry; no visible rashes Neurologic: gait normal; normal sensation and strength of LLE Psychological: alert and cooperative; normal mood and affect   Allergies  Allergen Reactions   Hydromet [Hydrocodone Bit-Homatrop Mbr] Itching   Iohexol Hives and Itching    PT DEVELOPS ITCHING AND HIVES SEVERAL HOURS AFTER INJECTION OF OMNIPAQUE    Iodine-131 Itching   Penicillins Itching    Has patient had a PCN reaction causing immediate rash, facial/tongue/throat swelling, SOB or lightheadedness with hypotension: Yes Has patient had a PCN reaction causing severe rash involving mucus membranes or skin necrosis: No Has patient had a PCN reaction that required hospitalization: No Has patient had a PCN reaction occurring within the last 10 years: No If all of the above answers are "NO",  then may proceed with Cephalosporin use.    Red Dye Itching    Past Medical History:  Diagnosis Date   Diverticulitis    HX OF   EKG, abnormal    HX OF LEFT ANTERIO FASCICULAR BLOCK ON 02-15-15 EKG   GERD (gastroesophageal reflux disease)    Heart murmur    NOW RESOLVED   Hypertension    OSA (obstructive sleep apnea) 11/27/2016   Ventral incisional hernia    Social History   Socioeconomic History    Marital status: Married    Spouse name: Sanford Lindblad   Number of children: 1   Years of education: Not on file   Highest education level: Bachelor's degree (e.g., BA, AB, BS)  Occupational History   Occupation: Sales  Tobacco Use   Smoking status: Former    Packs/day: 0.50    Types: Cigarettes   Smokeless tobacco: Never   Tobacco comments:    Current Cigars 1 every other day -----QUIT cigarettes in 2006>> 1/2PPD x 15 years  Vaping Use   Vaping Use: Never used  Substance and Sexual Activity   Alcohol use: Yes    Alcohol/week: 40.0 standard drinks of alcohol    Types: 40 Shots of liquor per week    Comment: 6-7 shots of liquor daily x 8 years. heavier on weekend   Drug use: No   Sexual activity: Not on file    Comment: still smokes cigars  Other Topics Concern   Not on file  Social History Narrative   Not on file   Social Determinants of Health   Financial Resource Strain: Low Risk  (09/05/2021)   Overall Financial Resource Strain (CARDIA)    Difficulty of Paying Living Expenses: Not hard at all  Food Insecurity: No Food Insecurity (09/05/2021)   Hunger Vital Sign    Worried About Running Out of Food in the Last Year: Never true    Ran Out of Food in the Last Year: Never true  Transportation Needs: No Transportation Needs (09/05/2021)   PRAPARE - Hydrologist (Medical): No    Lack of Transportation (Non-Medical): No  Physical Activity: Sufficiently Active (09/03/2021)   Exercise Vital Sign    Days of Exercise per Week: 5 days    Minutes of Exercise per Session: 30 min  Stress: Stress Concern Present (09/03/2021)   [place]    Feeling of Stress : To some extent  Social Connections: Socially Integrated (09/03/2021)   Social Connection and Isolation Panel [NHANES]    Frequency of Communication with Friends and Family: More than three times a week    Frequency of Social Gatherings  with Friends and Family: Once a week    Attends Religious Services: More than 4 times per year    Active Member of [employer] or Organizations: Yes    Attends Music therapist: More than 4 times per year    Marital Status: Married   Family History  Problem Relation Age of Onset   Heart disease Mother    High blood pressure Mother    Diabetes Mother    Hypertension Brother    Heart failure Brother    Stroke Brother    Heart disease Maternal Aunt    Cancer Maternal Uncle        pancreatic   Heart disease Maternal Uncle    Rheum arthritis Maternal Grandmother    High blood pressure Maternal Grandmother  Colon cancer Neg Hx    Past Surgical History:  Procedure Laterality Date   CARDIOVERSION N/A 10/03/2021   Procedure: CARDIOVERSION;  Surgeon: Jolaine Artist, MD;  Location: [facility];  Service: Cardiovascular;  Laterality: N/A;   COLON SURGERY  2006   colon resection for polyps   COLONOSCOPY WITH PROPOFOL N/A 10/31/2016   Procedure: COLONOSCOPY WITH PROPOFOL;  Surgeon: Mauri Pole, MD;  Location: [facility];  Service: Endoscopy;  Laterality: N/A;   HIATAL HERNIA REPAIR  1991   INSERTION OF MESH N/A 02/20/2015   Procedure: INSERTION OF MESH;  Surgeon: Excell Seltzer, MD;  Location: [facility];  Service: General;  Laterality: N/A;   LAPAROSCOPIC GASTRIC BANDING  2011   VENTRAL HERNIA REPAIR N/A 02/20/2015   Procedure: OPEN REPAIR VENTRAL INCISIONAL HERNIA;  Surgeon: Excell Seltzer, MD;  Location: [facility];  Service: General;  Laterality: N/A;   WISDOM TOOTH EXTRACTION         Vanessa Kick, MD 05/22/22 1603    Vanessa Kick, MD 05/22/22 1604

## 2022-06-25 ENCOUNTER — Telehealth (HOSPITAL_COMMUNITY): Payer: Self-pay | Admitting: Emergency Medicine

## 2022-06-25 DIAGNOSIS — R079 Chest pain, unspecified: Secondary | ICD-10-CM

## 2022-06-25 DIAGNOSIS — Z91041 Radiographic dye allergy status: Secondary | ICD-10-CM

## 2022-06-25 MED ORDER — DIPHENHYDRAMINE HCL 50 MG PO TABS: ORAL_TABLET | ORAL | 0 refills | Status: DC

## 2022-06-25 MED ORDER — PREDNISONE 50 MG PO TABS: ORAL_TABLET | ORAL | 0 refills | Status: DC

## 2022-06-25 NOTE — Telephone Encounter (Signed)
Reaching out to patient to offer assistance regarding upcoming cardiac imaging study; pt verbalizes understanding of appt date/time, parking situation and where to check in, pre-test NPO status and medications ordered, and verified current allergies; name and call back number provided for further questions should they arise Marchia Bond RN Navigator Cardiac Imaging [facility] [phone] office [phone] cell  [address] entrance Denies iv issues  13 hr prep prescribed and reviewed how to take it for contrast allergy mitigation

## 2022-06-27 ENCOUNTER — Ambulatory Visit (HOSPITAL_COMMUNITY)
Admission: RE | Admit: 2022-06-27 | Discharge: 2022-06-27 | Disposition: A | Discharge status: Home / Self Care | Payer: 59 | Source: Ambulatory Visit | Attending: Internal Medicine | Admitting: Internal Medicine

## 2022-06-27 ENCOUNTER — Encounter (HOSPITAL_COMMUNITY): Payer: Self-pay

## 2022-06-27 ENCOUNTER — Ambulatory Visit (HOSPITAL_COMMUNITY): Payer: 59

## 2022-06-27 DIAGNOSIS — I5022 Chronic systolic (congestive) heart failure: Secondary | ICD-10-CM | POA: Diagnosis present

## 2022-06-27 MED ORDER — IOHEXOL 350 MG/ML SOLN
100.0000 mL | Freq: Once | INTRAVENOUS | Status: AC | PRN
  Administered 2022-06-27: 100 mL via INTRAVENOUS

## 2022-06-27 MED ORDER — NITROGLYCERIN 0.4 MG SL SUBL
SUBLINGUAL_TABLET | SUBLINGUAL | Status: AC
  Filled 2022-06-27: qty 2

## 2022-06-30 ENCOUNTER — Ambulatory Visit (HOSPITAL_COMMUNITY): Admission: RE | Admit: 2022-06-30 | Payer: 59 | Source: Ambulatory Visit

## 2022-06-30 ENCOUNTER — Encounter: Payer: Self-pay | Admitting: Adult Health

## 2022-07-01 ENCOUNTER — Telehealth (INDEPENDENT_AMBULATORY_CARE_PROVIDER_SITE_OTHER): Payer: 59 | Admitting: Family Medicine

## 2022-07-01 ENCOUNTER — Telehealth (HOSPITAL_COMMUNITY): Payer: Self-pay

## 2022-07-01 DIAGNOSIS — U071 COVID-19: Secondary | ICD-10-CM | POA: Diagnosis not present

## 2022-07-01 MED ORDER — MOLNUPIRAVIR EUA 200MG CAPSULE: 4.0000 | ORAL_CAPSULE | Freq: Two times a day (BID) | ORAL | 0 refills | Status: AC

## 2022-07-01 MED ORDER — BENZONATATE 100 MG PO CAPS: ORAL_CAPSULE | ORAL | 0 refills | Status: DC

## 2022-07-01 NOTE — Telephone Encounter (Signed)
Patient aware and agreeable. 

## 2022-07-01 NOTE — Telephone Encounter (Signed)
Pt has been scheduled virtually.

## 2022-07-01 NOTE — Progress Notes (Signed)
Virtual Visit via Video Note  I connected with Chad Lyons  on 07/01/22 at  5:40 PM EST by a video enabled telemedicine application and verified that I am speaking with the correct person using two identifiers.  Location patient: [place] Location provider:work or home office Persons participating in the virtual visit: patient, provider  I discussed the limitations and requested verbal permission for telemedicine visit. The patient expressed understanding and agreed to proceed.   HPI:  Acute telemedicine visit for Covid: -Onset: 2-3 days ago, positive covid test yesterday -Symptoms include: nasal congestion, cough, had possible fever, diarrhea - mild -Denies: CP, SOB, vomiting, inability to tol oral intake -Has tried: tylenol -Pertinent past medical history: see below, first time he had covid, GFR > 60 2 months ago -Pertinent medication allergies: Allergies  Allergen Reactions   Hydromet [Hydrocodone Bit-Homatrop Mbr] Itching   Iohexol Hives and Itching    PT DEVELOPS ITCHING AND HIVES SEVERAL HOURS AFTER INJECTION OF OMNIPAQUE    Iodine-131 Itching   Penicillins Itching    Has patient had a PCN reaction causing immediate rash, facial/tongue/throat swelling, SOB or lightheadedness with hypotension: Yes Has patient had a PCN reaction causing severe rash involving mucus membranes or skin necrosis: No Has patient had a PCN reaction that required hospitalization: No Has patient had a PCN reaction occurring within the last 10 years: No If all of the above answers are "NO", then may proceed with Cephalosporin use.    Red Dye Itching  -COVID-19 vaccine status: has had multiple doses - last dose last fall Immunization History  Administered Date(s) Administered   Influenza Inj Mdck Quad Pf 05/25/2021   Influenza Split 06/30/2011, 06/30/2012   Influenza Whole 06/21/2010   Influenza,inj,Quad PF,6+ Mos 06/30/2016, 06/01/2017, 05/18/2018, 06/07/2019   Influenza-Unspecified 05/02/2019    PFIZER(Purple Top)SARS-COV-2 Vaccination 09/19/2019, 10/17/2019, 04/18/2020   Pfizer Covid-19 Vaccine Bivalent Booster 74yr & up 05/25/2021   Tdap 06/30/2011, 06/28/2013   Zoster Recombinat (Shingrix) 06/07/2019, 09/15/2019     ROS: See pertinent positives and negatives per HPI.  Past Medical History:  Diagnosis Date   Diverticulitis    HX OF   EKG, abnormal    HX OF LEFT ANTERIO FASCICULAR BLOCK ON 02-15-15 EKG   GERD (gastroesophageal reflux disease)    Heart murmur    NOW RESOLVED   Hypertension    OSA (obstructive sleep apnea) 11/27/2016   Ventral incisional hernia     Past Surgical History:  Procedure Laterality Date   CARDIOVERSION N/A 10/03/2021   Procedure: CARDIOVERSION;  Surgeon: BJolaine Artist MD;  [facility]  Service: Cardiovascular;  Laterality: N/A;   COLON SURGERY  2006   colon resection for polyps   COLONOSCOPY WITH PROPOFOL N/A 10/31/2016   Procedure: COLONOSCOPY WITH PROPOFOL;  Surgeon: KMauri Pole MD;  Location: [facility];  Service: Endoscopy;  Laterality: N/A;   HIATAL HERNIA REPAIR  1991   INSERTION OF MESH N/A 02/20/2015   Procedure: INSERTION OF MESH;  Surgeon: BExcell Seltzer MD;  Location: [facility];  Service: General;  Laterality: N/A;   LAPAROSCOPIC GASTRIC BANDING  2011   VENTRAL HERNIA REPAIR N/A 02/20/2015   Procedure: OPEN REPAIR VENTRAL INCISIONAL HERNIA;  Surgeon: BExcell Seltzer MD;  Location: [facility];  Service: General;  Laterality: N/A;   WISDOM TOOTH EXTRACTION       Current Outpatient Medications:    benzonatate (TESSALON PERLES) 100 MG capsule, 1-2 capsules up to twice daily as needed cough., Disp: 20 capsule, Rfl: 0   molnupiravir  EUA (LAGEVRIO) 200 mg CAPS capsule, Take 4 capsules (800 mg total) by mouth 2 (two) times daily for 5 days., Disp: 40 capsule, Rfl: 0   acetaminophen (TYLENOL) 500 MG tablet, Take 1,000 mg by mouth every 6 (six) hours as needed for headache (pain)., Disp: , Rfl:    ALPRAZolam  (XANAX) 0.5 MG tablet, Take 1 tablet (0.5 mg total) by mouth daily as needed., Disp: 90 tablet, Rfl: 0   b complex vitamins capsule, Take 1 capsule by mouth daily., Disp: , Rfl:    carvedilol (COREG) 3.125 MG tablet, Take 1 tablet (3.125 mg total) by mouth 2 (two) times daily with a meal., Disp: 180 tablet, Rfl: 3   Cholecalciferol (VITAMIN D3 PO), Take 1 capsule by mouth daily., Disp: , Rfl:    diphenhydrAMINE (BENADRYL) 50 MG tablet, Take 1 tablet ('50mg'$ ) 1 hr prior to CT appt, Disp: 1 tablet, Rfl: 0   ELIQUIS 5 MG TABS tablet, TAKE 1 TABLET BY MOUTH TWICE DAILY, Disp: 60 tablet, Rfl: 11   FARXIGA 10 MG TABS tablet, TAKE 1 TABLET BY MOUTH EACH MORNING BEFORE BREAKFAST, Disp: 30 tablet, Rfl: 11   fluticasone (FLONASE) 50 MCG/ACT nasal spray, Place 2 sprays into both nostrils daily. (Patient taking differently: Place 2 sprays into both nostrils daily as needed for allergies.), Disp: 48 g, Rfl: 1   furosemide (LASIX) 40 MG tablet, TAKE ONE TABLET BY MOUTH TWICE DAILY (Patient taking differently: Take 40 mg by mouth every morning. '20mg'$  every evening), Disp: 180 tablet, Rfl: 0   Multiple Vitamin (MULTIVITAMIN WITH MINERALS) TABS tablet, Take 1 tablet by mouth every morning., Disp: , Rfl:    potassium chloride SA (KLOR-CON M) 20 MEQ tablet, Take 1 tablet (20 mEq total) by mouth 2 (two) times daily., Disp: 90 tablet, Rfl: 1   predniSONE (DELTASONE) 20 MG tablet, Take 2 tablets (40 mg total) by mouth daily., Disp: 10 tablet, Rfl: 0   predniSONE (DELTASONE) 50 MG tablet, Take 1 tablet ('50mg'$ ) 13 hr prior to CT scan, take 1 tablet ('50mg'$ ) 7 hr prior to CT scan, take 1 tablet ('50mg'$ ) 1 hr prior to CT scab, Disp: 3 tablet, Rfl: 0   sacubitril-valsartan (ENTRESTO) 97-103 MG, Take 1 tablet by mouth 2 (two) times daily., Disp: 60 tablet, Rfl: 6   spironolactone (ALDACTONE) 25 MG tablet, TAKE 1/2 TABLET BY MOUTH DAILY, Disp: 15 tablet, Rfl: 11   triamcinolone cream (KENALOG) 0.1 %, Apply topically 2 times daily as  needed (rash)., Disp: 30 g, Rfl: 0   varenicline (CHANTIX) 1 MG tablet, Take 1 mg by mouth 2 (two) times daily., Disp: , Rfl:   EXAM:  VITALS per patient if applicable:  GENERAL: alert, oriented, appears well and in no acute distress  HEENT: atraumatic, conjunttiva clear, no obvious abnormalities on inspection of external nose and ears  NECK: normal movements of the head and neck  LUNGS: on inspection no signs of respiratory distress, breathing rate appears normal, no obvious gross SOB, gasping or wheezing  CV: no obvious cyanosis  MS: moves all visible extremities without noticeable abnormality  PSYCH/NEURO: pleasant and cooperative, no obvious depression or anxiety, speech and thought processing grossly intact  ASSESSMENT AND PLAN:  Discussed the following assessment and plan:  COVID-19   Discussed treatment options, side effect and risk of drug interactions, ideal treatment window, potential complications, isolation and precautions for COVID-19.  Discussed possibility of rebound with or without antivirals. Checked for/reviewed last GFR - listed in HPI if available. After lengthy  discussion, the patient opted for treatment with [place] due to being higher risk for complications of covid or severe disease and other factors. Discussed EUA status of this drug and the fact that there is preliminary limited knowledge of risks/interactions/side effects per EUA document vs possible benefits and precautions. This information was shared with patient during the visit and also was provided in patient instructions. Also, advised that patient discuss risks/interactions and use with pharmacist/treatment team as well.  The patient did want a prescription for cough, Tessalon Rx sent.  Other symptomatic care measures summarized in patient instructions.  Work/School slipped offered:  declined Advised to seek prompt virtual visit or in person care if worsening, new symptoms arise, or if is not  improving with treatment as expected per our conversation of expected course. Discussed options for follow up care. Did let this patient know that I do telemedicine on Tuesdays and Thursdays for [facility] and those are the days I am logged into the system. Advised to schedule follow up visit with PCP, [facility] virtual visits or [facility] if any further questions or concerns to avoid delays in care.   I discussed the assessment and treatment plan with the patient. The patient was provided an opportunity to ask questions and all were answered. The patient agreed with the plan and demonstrated an understanding of the instructions.     Lucretia Kern, DO

## 2022-07-01 NOTE — Patient Instructions (Signed)
HOME CARE TIPS:   -I sent the medication(s) we discussed to your pharmacy: Meds ordered this encounter  Medications   molnupiravir EUA (LAGEVRIO) 200 mg CAPS capsule    Sig: Take 4 capsules (800 mg total) by mouth 2 (two) times daily for 5 days.    Dispense:  40 capsule    Refill:  0   benzonatate (TESSALON PERLES) 100 MG capsule    Sig: 1-2 capsules up to twice daily as needed cough.    Dispense:  20 capsule    Refill:  0     -I sent in the [place] treatment or referral you requested per our discussion. Please see the information provided below and discuss further with the pharmacist/treatment team.    -there is a chance of rebound illness with covid after improving. This can happen whether or not you take an antiviral treatment. If you become sick again with covid after getting better, please schedule a follow up virtual visit and isolate again.  -can use tylenol if needed for fevers, aches and pains per instructions  -nasal saline sinus rinses twice daily  -stay hydrated, drink plenty of fluids and eat small healthy meals - avoid dairy  -can take 1000 IU (56mg) Vit D3 and 100-500 mg of Vit C daily per instructions  -follow up with your doctor in 2-3 days unless improving and feeling better  -stay home while sick, except to seek medical care. If you have COVID19, you will likely be contagious for 7-10 days. Flu or Influenza is likely contagious for about 7 days. Other respiratory viral infections remain contagious for 5-10+ days depending on the virus and many other factors. Wear a good mask that fits snugly (such as N95 or KN95) if around others to reduce the risk of transmission.  It was nice to meet you today, and I really hope you are feeling better soon. I help [facility] out with telemedicine visits on Tuesdays and Thursdays and am happy to help if you need a follow up virtual visit on those days. Otherwise, if you have any concerns or questions following this visit please  schedule a follow up visit with your Primary Care doctor or seek care at a local urgent care clinic to avoid delays in care.    Seek in person care or schedule a follow up video visit promptly if your symptoms worsen, new concerns arise or you are not improving with treatment. Call 911 and/or seek emergency care if your symptoms are severe or life threatening.   See the following link for the most recent information regarding Paxlovid:  www.[email or web address].com   Nirmatrelvir; Ritonavir Tablets What is this medication? NIRMATRELVIR; RITONAVIR (NIR ma TREL vir; ri TOE na veer) treats mild to moderate COVID-19. It may help people who are at high risk of developing severe illness. This medication works by limiting the spread of the virus in your body. The FDA has allowed the emergency use of this medication. This medicine may be used for other purposes; ask your health care provider or pharmacist if you have questions. COMMON BRAND NAME(S): PAXLOVID What should I tell my care team before I take this medication? They need to know if you have any of these conditions: Any allergies Any serious illness Kidney disease Liver disease An unusual or allergic reaction to nirmatrelvir, ritonavir, other medications, foods, dyes, or preservatives Pregnant or trying to get pregnant Breast-feeding How should I use this medication? This product contains 2 different medications that are packaged together. For the standard  dose, take 2 pink tablets of nirmatrelvir with 1 white tablet of ritonavir (3 tablets total) by mouth with water twice daily. Talk to your care team if you have kidney disease. You may need a different dose. Swallow the tablets whole. You can take it with or without food. If it upsets your stomach, take it with food. Take all of this medication unless your care team tells you to stop it early. Keep taking it even if you think you are better. Talk to your care team about the use of this medication in  children. While it may be prescribed for children as young as 12 years for selected conditions, precautions do apply. Overdosage: If you think you have taken too much of this medicine contact a poison control center or emergency room at once. NOTE: This medicine is only for you. Do not share this medicine with others. What if I miss a dose? If you miss a dose, take it as soon as you can unless it is more than 8 hours late. If it is more than 8 hours late, skip the missed dose. Take the next dose at the normal time. Do not take extra or 2 doses at the same time to make up for the missed dose. What may interact with this medication? Do not take this medication with any of the following medications: Alfuzosin Certain medications for anxiety or sleep like midazolam, triazolam Certain medications for cancer like apalutamide, enzalutamide Certain medications for cholesterol like lovastatin, simvastatin Certain medications for irregular heart beat like amiodarone, dronedarone, flecainide, propafenone, quinidine Certain medications for pain like meperidine, piroxicam Certain medications for psychotic disorders like clozapine, lurasidone, pimozide Certain medications for seizures like carbamazepine, phenobarbital, phenytoin Colchicine Eletriptan Eplerenone Ergot alkaloids like dihydroergotamine, ergonovine, ergotamine, methylergonovine Finerenone Flibanserin Ivabradine Lomitapide Naloxegol Ranolazine Rifampin Sildenafil Silodosin St. John's Wort Tolvaptan Ubrogepant Voclosporin This medication may also interact with the following medications: Bedaquiline Birth control pills Bosentan Certain antibiotics like erythromycin or clarithromycin Certain medications for blood pressure like amlodipine, diltiazem, felodipine, nicardipine, nifedipine Certain medications for cancer like abemaciclib, ceritinib, dasatinib, encorafenib, ibrutinib, ivosidenib, neratinib, nilotinib, venetoclax,  vinblastine, vincristine Certain medications for cholesterol like atorvastatin, rosuvastatin Certain medications for depression like bupropion, trazodone Certain medications for fungal infections like isavuconazonium, itraconazole, ketoconazole, voriconazole Certain medications for hepatitis C like elbasvir; grazoprevir, dasabuvir; ombitasvir; paritaprevir; ritonavir, glecaprevir; pibrentasvir, sofosbuvir; velpatasvir; voxilaprevir Certain medications for HIV or AIDS Certain medications for irregular heartbeat like lidocaine Certain medications that treat or prevent blood clots like rivaroxaban, warfarin Digoxin Fentanyl Medications that lower your chance of fighting infection like cyclosporine, sirolimus, tacrolimus Methadone Quetiapine Rifabutin Salmeterol Steroid medications like betamethasone, budesonide, ciclesonide, dexamethasone, fluticasone, methylprednisone, mometasone, triamcinolone This list may not describe all possible interactions. Give your health care provider a list of all the medicines, herbs, non-prescription drugs, or dietary supplements you use. Also tell them if you smoke, drink alcohol, or use illegal drugs. Some items may interact with your medicine. What should I watch for while using this medication? Your condition will be monitored carefully while you are receiving this medication. Visit your care team for regular checkups. Tell your care team if your symptoms do not start to get better or if they get worse. If you have untreated HIV infection, this medication may lead to some HIV medications not working as well in the future. Birth control may not work properly while you are taking this medication. Talk to your care team about using an extra method of birth control. What side  effects may I notice from receiving this medication? Side effects that you should report to your care team as soon as possible: Allergic reactions--skin rash, itching, hives, swelling of the  face, lips, tongue, or throat Liver injury--right upper belly pain, loss of appetite, nausea, light-colored stool, dark yellow or brown urine, yellowing skin or eyes, unusual weakness or fatigue Redness, blistering, peeling, or loosening of the skin, including inside the mouth Side effects that usually do not require medical attention (report these to your care team if they continue or are bothersome): Change in taste Diarrhea General discomfort and fatigue Increase in blood pressure Muscle pain Nausea Stomach pain This list may not describe all possible side effects. Call your doctor for medical advice about side effects. You may report side effects to FDA at 1-800-FDA-1088. Where should I keep my medication? Keep out of the reach of children and pets. Store at room temperature between 20 and 25 degrees C (68 and 77 degrees F). Get rid of any unused medication after the expiration date. To get rid of medications that are no longer needed or have expired: Take the medication to a medication take-back program. Check with your pharmacy or law enforcement to find a location. If you cannot return the medication, check the label or package insert to see if the medication should be thrown out in the garbage or flushed down the toilet. If you are not sure, ask your care team. If it is safe to put it in the trash, take the medication out of the container. Mix the medication with cat litter, dirt, coffee grounds, or other unwanted substance. Seal the mixture in a bag or container. Put it in the trash. NOTE: This sheet is a summary. It may not cover all possible information. If you have questions about this medicine, talk to your doctor, pharmacist, or health care provider.  2022 Elsevier/Gold Standard (2021-05-06 00:00:00)

## 2022-07-21 ENCOUNTER — Other Ambulatory Visit (HOSPITAL_COMMUNITY): Payer: Self-pay | Admitting: Internal Medicine

## 2022-08-15 ENCOUNTER — Telehealth: Payer: Self-pay | Admitting: *Deleted

## 2022-08-15 ENCOUNTER — Encounter: Payer: Self-pay | Admitting: *Deleted

## 2022-08-15 NOTE — Patient Outreach (Addendum)
  Care Coordination   Initial Visit Note   08/15/2022 Name: Chad Lyons MRN: [id number] DOB: 1966-05-20  Chad Lyons is a 56 y.o. year old male who sees Bensimhon, Shaune Pascal, MD for primary care. I spoke with  Chad Lyons by phone today.  What matters to the patients health and wellness today?  Change pharmacy for ongoing medication    Goals Addressed               This Visit's Progress     COMPLETED: Pharmacy change (pt-stated)        Care Coordination Interventions: Advised patient to contact his new pharmacy and inquire on the information needed to transfer all prescribed medications and future fax for his providers. Then reach out to the current pharmacy with this request.  Reviewed medications with patient and discussed adherence with no needed refills  Reviewed scheduled/upcoming provider appointments including sufficient transportation source Screening for signs and symptoms of depression related to chronic disease state  Assessed social determinant of health barriers Educated on care management services related to social workers, pharmacy and a Designer, jewellery. No additional needs presented at this time.         SDOH assessments and interventions completed:  Yes  SDOH Interventions Today    Flowsheet Row Most Recent Value  SDOH Interventions   Food Insecurity Interventions Intervention Not Indicated  Housing Interventions Intervention Not Indicated  Transportation Interventions Intervention Not Indicated  Utilities Interventions Intervention Not Indicated        Care Coordination Interventions:  Yes, provided   Follow up plan: No further intervention required.   Encounter Outcome:  Pt. Visit Completed   Raina Mina, RN Care Management Coordinator [place] Office [phone]

## 2022-08-15 NOTE — Patient Instructions (Signed)
Visit Information  Thank you for taking time to visit with me today. Please don't hesitate to contact me if I can be of assistance to you.   Following are the goals we discussed today:   Goals Addressed               This Visit's Progress     COMPLETED: Pharmacy change (pt-stated)        Care Coordination Interventions: Advised patient to contact his new pharmacy and inquire on the information needed to transfer all prescribed medications and future fax for his providers. Then reach out to the current pharmacy with this request.  Reviewed medications with patient and discussed adherence with no needed refills  Reviewed scheduled/upcoming provider appointments including sufficient transportation source Screening for signs and symptoms of depression related to chronic disease state  Assessed social determinant of health barriers Educated on care management services related to social workers, pharmacy and a Designer, jewellery. No additional needs presented at this time.         Please call the care guide team at [phone] if you need to cancel or reschedule your appointment.   If you are experiencing a Mental Health or [place] or need someone to talk to, please call the Suicide and Crisis Lifeline: 988  Patient verbalizes understanding of instructions and care plan provided today and agrees to view in [facility]. Active MyChart status and patient understanding of how to access instructions and care plan via MyChart confirmed with patient.     No further follow up required: No follow up needs  Raina Mina, RN Care Management Coordinator [place] Office [phone]

## 2022-08-16 ENCOUNTER — Other Ambulatory Visit (HOSPITAL_COMMUNITY): Payer: Self-pay | Admitting: Family Medicine

## 2022-08-21 ENCOUNTER — Telehealth: Payer: Self-pay | Admitting: Adult Health

## 2022-08-21 NOTE — Telephone Encounter (Signed)
Pt called to request a refill of the furosemide (LASIX) 40 MG tablet   LOV:  09/12/21    [id number]- [place], [place] - [address] Phone: [phone] Fax: [phone]     Pt says he will call back later this afternoon to add more to this request.

## 2022-08-21 NOTE — Telephone Encounter (Signed)
Tried to call pt but no answer. Pt needs to call Cardiologist for refill concerns as this is filled by her.

## 2022-09-01 ENCOUNTER — Other Ambulatory Visit: Payer: Self-pay | Admitting: Adult Health

## 2022-09-01 DIAGNOSIS — G4700 Insomnia, unspecified: Secondary | ICD-10-CM

## 2022-09-02 NOTE — Telephone Encounter (Signed)
Patient need to schedule a cpe for more refills.

## 2022-10-22 ENCOUNTER — Other Ambulatory Visit (HOSPITAL_COMMUNITY): Payer: Self-pay | Admitting: Internal Medicine

## 2022-12-11 ENCOUNTER — Other Ambulatory Visit (HOSPITAL_COMMUNITY)
Admission: RE | Admit: 2022-12-11 | Discharge: 2022-12-11 | Disposition: A | Discharge status: Home / Self Care | Payer: 59 | Source: Ambulatory Visit

## 2022-12-11 ENCOUNTER — Encounter: Payer: Self-pay | Admitting: Internal Medicine

## 2022-12-11 ENCOUNTER — Other Ambulatory Visit: Payer: Self-pay | Admitting: Internal Medicine

## 2022-12-11 ENCOUNTER — Ambulatory Visit (INDEPENDENT_AMBULATORY_CARE_PROVIDER_SITE_OTHER): Payer: 59 | Admitting: Internal Medicine

## 2022-12-11 ENCOUNTER — Ambulatory Visit (HOSPITAL_COMMUNITY)
Admission: RE | Admit: 2022-12-11 | Discharge: 2022-12-11 | Disposition: A | Discharge status: Home / Self Care | Payer: 59 | Source: Ambulatory Visit | Attending: Internal Medicine | Admitting: Internal Medicine

## 2022-12-11 VITALS — BP 102/70 | HR 72 | Temp 99.1°F | Wt 309.4 lb

## 2022-12-11 DIAGNOSIS — N451 Epididymitis: Secondary | ICD-10-CM | POA: Insufficient documentation

## 2022-12-11 MED ORDER — LEVOFLOXACIN 500 MG PO TABS: 500.0000 mg | ORAL_TABLET | Freq: Every day | ORAL | 0 refills | Status: AC

## 2022-12-11 NOTE — Addendum Note (Signed)
Addended by: [place] Cloud on: 12/11/2022 03:26 PM   Modules accepted: Orders

## 2022-12-11 NOTE — Progress Notes (Signed)
Established Patient Office Visit     CC/Reason for Visit: General malaise, right testicular pain and enlargement  HPI: Chad Lyons is a 57 y.o. male who is coming in today for the above mentioned reasons.  For the past 4 days he has experienced general malaise, fever, diaphoresis.  He started having right-sided scrotal pain the past 3 days.  He thought he had a small boil in his groin.  Over the last 2 days he has noted significant right scrotal enlargement to the point where it is at least 3 times the size of his left scrotum.   Past Medical/Surgical History: Past Medical History:  Diagnosis Date   Diverticulitis    HX OF   EKG, abnormal    HX OF LEFT ANTERIO FASCICULAR BLOCK ON 02-15-15 EKG   GERD (gastroesophageal reflux disease)    Heart murmur    NOW RESOLVED   Hypertension    OSA (obstructive sleep apnea) 11/27/2016   Ventral incisional hernia     Past Surgical History:  Procedure Laterality Date   CARDIOVERSION N/A 10/03/2021   Procedure: CARDIOVERSION;  Surgeon: Dolores Patty, MD;  Location: [facility] ENDOSCOPY;  Service: Cardiovascular;  Laterality: N/A;   COLON SURGERY  2006   colon resection for polyps   COLONOSCOPY WITH PROPOFOL N/A 10/31/2016   Procedure: COLONOSCOPY WITH PROPOFOL;  Surgeon: Napoleon Form, MD;  Location: [facility] ENDOSCOPY;  Service: Endoscopy;  Laterality: N/A;   HIATAL HERNIA REPAIR  1991   INSERTION OF MESH N/A 02/20/2015   Procedure: INSERTION OF MESH;  Surgeon: Glenna Fellows, MD;  Location: [facility] ORS;  Service: General;  Laterality: N/A;   LAPAROSCOPIC GASTRIC BANDING  2011   VENTRAL HERNIA REPAIR N/A 02/20/2015   Procedure: OPEN REPAIR VENTRAL INCISIONAL HERNIA;  Surgeon: Glenna Fellows, MD;  Location: [facility] ORS;  Service: General;  Laterality: N/A;   WISDOM TOOTH EXTRACTION      Social History:  reports that he has quit smoking. His smoking use included cigarettes. He smoked an average of .5 packs per day. He has never used smokeless  tobacco. He reports current alcohol use of about 40.0 standard drinks of alcohol per week. He reports that he does not use drugs.  Allergies: Allergies  Allergen Reactions   Hydromet [Hydrocodone Bit-Homatrop Mbr] Itching   Iohexol Hives and Itching    PT DEVELOPS ITCHING AND HIVES SEVERAL HOURS AFTER INJECTION OF OMNIPAQUE    Iodine-131 Itching   Penicillins Itching    Has patient had a PCN reaction causing immediate rash, facial/tongue/throat swelling, SOB or lightheadedness with hypotension: Yes Has patient had a PCN reaction causing severe rash involving mucus membranes or skin necrosis: No Has patient had a PCN reaction that required hospitalization: No Has patient had a PCN reaction occurring within the last 10 years: No If all of the above answers are "NO", then may proceed with Cephalosporin use.    Red Dye Itching    Family History:  Family History  Problem Relation Age of Onset   Heart disease Mother    High blood pressure Mother    Diabetes Mother    Hypertension Brother    Heart failure Brother    Stroke Brother    Heart disease Maternal Aunt    Cancer Maternal Uncle        pancreatic   Heart disease Maternal Uncle    Rheum arthritis Maternal Grandmother    High blood pressure Maternal Grandmother    Colon cancer Neg Hx  Current Outpatient Medications:    acetaminophen (TYLENOL) 500 MG tablet, Take 1,000 mg by mouth every 6 (six) hours as needed for headache (pain)., Disp: , Rfl:    ALPRAZolam (XANAX) 0.5 MG tablet, Take 1 tablet (0.5 mg total) by mouth daily as needed., Disp: 90 tablet, Rfl: 0   b complex vitamins capsule, Take 1 capsule by mouth daily., Disp: , Rfl:    benzonatate (TESSALON PERLES) 100 MG capsule, 1-2 capsules up to twice daily as needed cough., Disp: 20 capsule, Rfl: 0   carvedilol (COREG) 3.125 MG tablet, Take 1 tablet (3.125 mg total) by mouth 2 (two) times daily with a meal., Disp: 180 tablet, Rfl: 3   Cholecalciferol (VITAMIN D3  PO), Take 1 capsule by mouth daily., Disp: , Rfl:    diphenhydrAMINE (BENADRYL) 50 MG tablet, Take 1 tablet ( ) 1 hr prior to CT appt, Disp: 1 tablet, Rfl: 0   ELIQUIS 5 MG TABS tablet, TAKE 1 TABLET BY MOUTH TWICE DAILY, Disp: 60 tablet, Rfl: 11   FARXIGA 10 MG TABS tablet, TAKE 1 TABLET BY MOUTH EACH MORNING BEFORE BREAKFAST, Disp: 30 tablet, Rfl: 11   fluticasone (FLONASE) 50 MCG/ACT nasal spray, Place 2 sprays into both nostrils daily. (Patient taking differently: Place 2 sprays into both nostrils daily as needed for allergies.), Disp: 48 g, Rfl: 1   furosemide (LASIX) 40 MG tablet, TAKE 2 TABLETS BY MOUTH EACH MORNING AND ONE EVERY EVENING, Disp: 90 tablet, Rfl: 3   levofloxacin (LEVAQUIN) 500 MG tablet, Take 1 tablet (500 mg total) by mouth daily for 10 days., Disp: 10 tablet, Rfl: 0   Multiple Vitamin (MULTIVITAMIN WITH MINERALS) TABS tablet, Take 1 tablet by mouth every morning., Disp: , Rfl:    potassium chloride SA (KLOR-CON M) 20 MEQ tablet, TAKE 1 TABLET BY MOUTH TWICE DAILY, Disp: 90 tablet, Rfl: 1   predniSONE (DELTASONE) 20 MG tablet, Take 2 tablets (40 mg total) by mouth daily., Disp: 10 tablet, Rfl: 0   predniSONE (DELTASONE) 50 MG tablet, Take 1 tablet ( ) 13 hr prior to CT scan, take 1 tablet ( ) 7 hr prior to CT scan, take 1 tablet ( ) 1 hr prior to CT scab, Disp: 3 tablet, Rfl: 0   sacubitril-valsartan (ENTRESTO) 97-103 MG, Take 1 tablet by mouth 2 (two) times daily., Disp: 60 tablet, Rfl: 6   spironolactone (ALDACTONE) 25 MG tablet, TAKE 1/2 TABLET BY MOUTH DAILY, Disp: 15 tablet, Rfl: 11   triamcinolone cream (KENALOG) 0.1 %, Apply topically 2 times daily as needed (rash)., Disp: 30 g, Rfl: 0   varenicline (CHANTIX) 1 MG tablet, Take 1 mg by mouth 2 (two) times daily., Disp: , Rfl:   Review of Systems:  Negative unless indicated in HPI.   Physical Exam: Vitals:   12/11/22 1445  BP: 102/70  Pulse: 72  Temp: 99.1 F (37.3 C)  TempSrc: Oral  SpO2: 98%   Weight: (!) 309 lb 6.4 oz (140.3 kg)    Body mass index is 39.72 kg/m.   Physical Exam Vitals reviewed.  Constitutional:      Appearance: Normal appearance.  HENT:     Head: Normocephalic and atraumatic.  Eyes:     Conjunctiva/sclera: Conjunctivae normal.     Pupils: Pupils are equal, round, and reactive to light.  Genitourinary:    Testes:        Right: Tenderness and swelling present.     Epididymis:     Right: Enlarged. Tenderness present.     Left:  Normal.  Skin:    General: Skin is warm and dry.  Neurological:     General: No focal deficit present.     Mental Status: He is alert and oriented to person, place, and time.  Psychiatric:        Mood and Affect: Mood normal.        Behavior: Behavior normal.        Thought Content: Thought content normal.        Judgment: Judgment normal.      Impression and Plan:  Acute epididymitis -     US SCROTUM; Future -     levoFLOXacin; Take 1 tablet (500 mg total) by mouth daily for 10 days.  Dispense: 10 tablet; Refill: 0 -     Urine cytology ancillary only  -Based on right-sided scrotal pain and rapidity of enlargement, I am concerned for acute epididymitis.  Sent for ultrasound, treat with Levaquin, I will test for GC and chlamydia.   Time spent:30 minutes reviewing chart, interviewing and examining patient and formulating plan of care.     Chaya Jan, MD [facility]

## 2022-12-12 ENCOUNTER — Other Ambulatory Visit (HOSPITAL_COMMUNITY): Payer: Self-pay | Admitting: Cardiology

## 2022-12-12 LAB — URINE CYTOLOGY ANCILLARY ONLY
Chlamydia: NEGATIVE
Comment: NEGATIVE
Comment: NEGATIVE
Comment: NORMAL
Neisseria Gonorrhea: NEGATIVE
Trichomonas: NEGATIVE

## 2022-12-12 MED ORDER — ENTRESTO 97-103 MG PO TABS: 1.0000 | ORAL_TABLET | Freq: Two times a day (BID) | ORAL | 3 refills | Status: DC

## 2022-12-18 ENCOUNTER — Ambulatory Visit (INDEPENDENT_AMBULATORY_CARE_PROVIDER_SITE_OTHER): Payer: BC Managed Care – PPO | Admitting: Adult Health

## 2022-12-18 VITALS — BP 110/78 | HR 65 | Temp 98.2°F | Ht 74.0 in | Wt 305.0 lb

## 2022-12-18 DIAGNOSIS — N492 Inflammatory disorders of scrotum: Secondary | ICD-10-CM

## 2022-12-18 DIAGNOSIS — I421 Obstructive hypertrophic cardiomyopathy: Secondary | ICD-10-CM | POA: Diagnosis not present

## 2022-12-18 DIAGNOSIS — Z0000 Encounter for general adult medical examination without abnormal findings: Secondary | ICD-10-CM

## 2022-12-18 DIAGNOSIS — G4733 Obstructive sleep apnea (adult) (pediatric): Secondary | ICD-10-CM

## 2022-12-18 DIAGNOSIS — I10 Essential (primary) hypertension: Secondary | ICD-10-CM | POA: Diagnosis not present

## 2022-12-18 DIAGNOSIS — Z720 Tobacco use: Secondary | ICD-10-CM

## 2022-12-18 DIAGNOSIS — G4700 Insomnia, unspecified: Secondary | ICD-10-CM

## 2022-12-18 DIAGNOSIS — F1010 Alcohol abuse, uncomplicated: Secondary | ICD-10-CM

## 2022-12-18 DIAGNOSIS — I5032 Chronic diastolic (congestive) heart failure: Secondary | ICD-10-CM

## 2022-12-18 DIAGNOSIS — E668 Other obesity: Secondary | ICD-10-CM

## 2022-12-18 LAB — LIPID PANEL
Cholesterol: 144 mg/dL (ref 0–200)
HDL: 43.8 mg/dL (ref >39.00)
LDL Cholesterol: 81 mg/dL (ref 0–99)
NonHDL: 99.71
Total CHOL/HDL Ratio: 3
Triglycerides: 94 mg/dL (ref 0.0–149.0)
VLDL: 18.8 mg/dL (ref 0.0–40.0)

## 2022-12-18 LAB — COMPREHENSIVE METABOLIC PANEL
ALT: 24 U/L (ref 0–53)
AST: 27 U/L (ref 0–37)
Albumin: 3.6 g/dL (ref 3.5–5.2)
Alkaline Phosphatase: 63 U/L (ref 39–117)
BUN: 18 mg/dL (ref 6–23)
CO2: 23 mEq/L (ref 19–32)
Calcium: 9.3 mg/dL (ref 8.4–10.5)
Chloride: 105 mEq/L (ref 96–112)
Creatinine, Ser: 1.26 mg/dL (ref 0.40–1.50)
GFR: 63.22 mL/min (ref >60.00)
Glucose, Bld: 89 mg/dL (ref 70–99)
Potassium: 4.7 mEq/L (ref 3.5–5.1)
Sodium: 140 mEq/L (ref 135–145)
Total Bilirubin: 0.8 mg/dL (ref 0.2–1.2)
Total Protein: 6.6 g/dL (ref 6.0–8.3)

## 2022-12-18 LAB — CBC
HCT: 43.4 % (ref 39.0–52.0)
Hemoglobin: 14.4 g/dL (ref 13.0–17.0)
MCHC: 33.2 g/dL (ref 30.0–36.0)
MCV: 94.9 fl (ref 78.0–100.0)
Platelets: 323 10*3/uL (ref 150.0–400.0)
RBC: 4.58 Mil/uL (ref 4.22–5.81)
RDW: 13.8 % (ref 11.5–15.5)
WBC: 7.3 10*3/uL (ref 4.0–10.5)

## 2022-12-18 LAB — PSA: PSA: 0.35 ng/mL (ref 0.10–4.00)

## 2022-12-18 LAB — TSH: TSH: 0.49 u[IU]/mL (ref 0.35–5.50)

## 2022-12-18 MED ORDER — TRIAMCINOLONE ACETONIDE 0.1 % EX CREA: 1.0000 | TOPICAL_CREAM | Freq: Two times a day (BID) | CUTANEOUS | 0 refills | Status: DC | PRN

## 2022-12-18 MED ORDER — ALPRAZOLAM 0.5 MG PO TABS: 0.5000 mg | ORAL_TABLET | Freq: Every day | ORAL | 0 refills | Status: DC | PRN

## 2022-12-18 NOTE — Progress Notes (Signed)
Subjective:    Patient ID: Chad Lyons, male    DOB: 10-28-1965, 57 y.o.   MRN: [id number]  HPI Patient presents for yearly preventative medicine examination. He is a pleasant 57 year old male who  has a past medical history of Diverticulitis, EKG, abnormal, GERD (gastroesophageal reflux disease), Heart murmur, Hypertension, OSA (obstructive sleep apnea) (11/27/2016), and Ventral incisional hernia.  He is mostly living and working in [place] now. He and his wife have separated.   Essential Hypertension - -managed with Coreg 3.125 mg twice daily, Entresto 97-103 twice daily, spironolactone 0.5 mg daily and Lasix 40 mg daily.  He denies dizziness, lightheadedness, chest pain, or shortness of breath BP Readings from Last 3 Encounters:  12/18/22 110/78  12/11/22 102/70  06/27/22 121/83   Insomnia - uses Xanax 0.5 mg PRN   OSA -CPAP therapy.  Uses his CPAP every night.  Does report getting good sleep, no daytime somnolence.  Is followed by pulmonary on a yearly basis  Chronic Diastolic CHF-managed with Lasix 40 mg and Farxiga 10 mg.  He denies shortness of breath with exertion, chest pain, lower extremity edema, or palpitations.  Hypertrophic Cardiomyopathy -managed with Coreg 3.125 mg twice daily, Entresto 97-103 twice daily, and spironolactone 0.5 mg daily.  Obesity-had bariatric surgery in 2011 and lost over 100 pounds.  His heaviest weight was 450 pounds. Wt Readings from Last 3 Encounters:  12/18/22 (!) 305 lb (138.3 kg)  12/11/22 (!) 309 lb 6.4 oz (140.3 kg)  04/25/22 (!) 320 lb (145.2 kg)   Afib- diagnosed in 08/2021.  He was placed on amiodarone and Lasix.  Had DCCV in February 2023 to normal sinus rhythm.  Denies shortness of breath, chest pain, or palpitations.  He has not had any bleeding issues.  Tobacco Use -  He quit smoking using Chantix over a year ago.  Alcohol Use - He drinks 1-3 drinks a day.   Acute epididymitis -seen 4 days ago by another provider in the  office for general malaise, fever, and diaphoresis.  He had been having right-sided scrotal pain for 3 days.  He initially thought he had a small boil in his groin.  Over the next 2 days he noticed significant right scrotal enlargement to the point where it was a least 3 times the size of his left scrotum.  He was started on Levaquin 500 milligrams daily x 10 days for acute epididymitis.  His ultrasound showed a 7 mm simple right epididymal cyst otherwise unremarkable testicular ultrasound. He reports that over the last few days he has been having foul smelling draining coming from the scrotum. Testicle on right side is tender to touch.   All immunizations and health maintenance protocols were reviewed with the patient and needed orders were placed.  Appropriate screening laboratory values were ordered for the patient including screening of hyperlipidemia, renal function and hepatic function.  Medication reconciliation,  past medical history, social history, problem list and allergies were reviewed in detail with the patient  Goals were established with regard to weight loss, exercise, and  diet in compliance with medications  Review of Systems  Constitutional: Negative.   HENT: Negative.    Eyes: Negative.   Respiratory: Negative.    Cardiovascular: Negative.   Gastrointestinal: Negative.   Endocrine: Negative.   Genitourinary:  Positive for scrotal swelling and testicular pain.  Musculoskeletal: Negative.   Skin: Negative.   Allergic/Immunologic: Negative.   Neurological: Negative.   Hematological: Negative.   Psychiatric/Behavioral: Negative.  All other systems reviewed and are negative.  Past Medical History:  Diagnosis Date   Diverticulitis    HX OF   EKG, abnormal    HX OF LEFT ANTERIO FASCICULAR BLOCK ON 02-15-15 EKG   GERD (gastroesophageal reflux disease)    Heart murmur    NOW RESOLVED   Hypertension    OSA (obstructive sleep apnea) 11/27/2016   Ventral incisional  hernia     Social History   Socioeconomic History   Marital status: Married    Spouse name: Dionis Autry   Number of children: 1   Years of education: Not on file   Highest education level: Bachelor's degree (e.g., BA, AB, BS)  Occupational History   Occupation: Sales  Tobacco Use   Smoking status: Former    Packs/day: .5    Types: Cigarettes   Smokeless tobacco: Never   Tobacco comments:    Current Cigars 1 every other day -----QUIT cigarettes in 2006>> 1/2PPD x 15 years  Vaping Use   Vaping Use: Never used  Substance and Sexual Activity   Alcohol use: Yes    Alcohol/week: 40.0 standard drinks of alcohol    Types: 40 Shots of liquor per week    Comment: 6-7 shots of liquor daily x 8 years. heavier on weekend   Drug use: No   Sexual activity: Not on file    Comment: still smokes cigars  Other Topics Concern   Not on file  Social History Narrative   Not on file   Social Determinants of Health   Financial Resource Strain: Low Risk  (09/05/2021)   Overall Financial Resource Strain (CARDIA)    Difficulty of Paying Living Expenses: Not hard at all  Food Insecurity: No Food Insecurity (08/15/2022)   Hunger Vital Sign    Worried About Running Out of Food in the Last Year: Never true    Ran Out of Food in the Last Year: Never true  Transportation Needs: No Transportation Needs (08/15/2022)   PRAPARE - Administrator, Civil Service (Medical): No    Lack of Transportation (Non-Medical): No  Physical Activity: Sufficiently Active (09/03/2021)   Exercise Vital Sign    Days of Exercise per Week: 5 days    Minutes of Exercise per Session: 30 min  Stress: Stress Concern Present (09/03/2021)   [employer] of Occupational Health - Occupational Stress Questionnaire    Feeling of Stress : To some extent  Social Connections: Socially Integrated (09/03/2021)   Social Connection and Isolation Panel [NHANES]    Frequency of Communication with Friends and Family: More  than three times a week    Frequency of Social Gatherings with Friends and Family: Once a week    Attends Religious Services: More than 4 times per year    Active Member of Clubs or Organizations: Yes    Attends Engineer, structural: More than 4 times per year    Marital Status: Married  Catering manager Violence: Not on file    Past Surgical History:  Procedure Laterality Date   CARDIOVERSION N/A 10/03/2021   Procedure: CARDIOVERSION;  Surgeon: Dolores Patty, MD;  Location: [facility] ENDOSCOPY;  Service: Cardiovascular;  Laterality: N/A;   COLON SURGERY  2006   colon resection for polyps   COLONOSCOPY WITH PROPOFOL N/A 10/31/2016   Procedure: COLONOSCOPY WITH PROPOFOL;  Surgeon: Napoleon Form, MD;  Location: [facility] ENDOSCOPY;  Service: Endoscopy;  Laterality: N/A;   HIATAL HERNIA REPAIR  1991   INSERTION OF  MESH N/A 02/20/2015   Procedure: INSERTION OF MESH;  Surgeon: Glenna Fellows, MD;  Location: [facility] ORS;  Service: General;  Laterality: N/A;   LAPAROSCOPIC GASTRIC BANDING  2011   VENTRAL HERNIA REPAIR N/A 02/20/2015   Procedure: OPEN REPAIR VENTRAL INCISIONAL HERNIA;  Surgeon: Glenna Fellows, MD;  Location: [facility] ORS;  Service: General;  Laterality: N/A;   WISDOM TOOTH EXTRACTION      Family History  Problem Relation Age of Onset   Heart disease Mother    High blood pressure Mother    Diabetes Mother    Hypertension Brother    Heart failure Brother    Stroke Brother    Heart disease Maternal Aunt    Cancer Maternal Uncle        pancreatic   Heart disease Maternal Uncle    Rheum arthritis Maternal Grandmother    High blood pressure Maternal Grandmother    Colon cancer Neg Hx     Allergies  Allergen Reactions   Hydromet [Hydrocodone Bit-Homatrop Mbr] Itching   Iohexol Hives and Itching    PT DEVELOPS ITCHING AND HIVES SEVERAL HOURS AFTER INJECTION OF OMNIPAQUE    Iodine-131 Itching   Penicillins Itching    Has patient had a PCN reaction causing immediate rash,  facial/tongue/throat swelling, SOB or lightheadedness with hypotension: Yes Has patient had a PCN reaction causing severe rash involving mucus membranes or skin necrosis: No Has patient had a PCN reaction that required hospitalization: No Has patient had a PCN reaction occurring within the last 10 years: No If all of the above answers are "NO", then may proceed with Cephalosporin use.    Red Dye Itching    Current Outpatient Medications on File Prior to Visit  Medication Sig Dispense Refill   acetaminophen (TYLENOL) 500 MG tablet Take 1,000 mg by mouth every 6 (six) hours as needed for headache (pain).     ALPRAZolam (XANAX) 0.5 MG tablet Take 1 tablet (0.5 mg total) by mouth daily as needed. 90 tablet 0   b complex vitamins capsule Take 1 capsule by mouth daily.     benzonatate (TESSALON PERLES) 100 MG capsule 1-2 capsules up to twice daily as needed cough. 20 capsule 0   carvedilol (COREG) 3.125 MG tablet Take 1 tablet (3.125 mg total) by mouth 2 (two) times daily with a meal. 180 tablet 3   Cholecalciferol (VITAMIN D3 PO) Take 1 capsule by mouth daily.     diphenhydrAMINE (BENADRYL) 50 MG tablet Take 1 tablet (50mg ) 1 hr prior to CT appt 1 tablet 0   ELIQUIS 5 MG TABS tablet TAKE 1 TABLET BY MOUTH TWICE DAILY 60 tablet 11   FARXIGA 10 MG TABS tablet TAKE 1 TABLET BY MOUTH EACH MORNING BEFORE BREAKFAST 30 tablet 11   fluticasone (FLONASE) 50 MCG/ACT nasal spray Place 2 sprays into both nostrils daily. (Patient taking differently: Place 2 sprays into both nostrils daily as needed for allergies.) 48 g 1   furosemide (LASIX) 40 MG tablet TAKE 2 TABLETS BY MOUTH EACH MORNING AND ONE EVERY EVENING 90 tablet 3   levofloxacin (LEVAQUIN) 500 MG tablet Take 1 tablet (500 mg total) by mouth daily for 10 days. 10 tablet 0   Multiple Vitamin (MULTIVITAMIN WITH MINERALS) TABS tablet Take 1 tablet by mouth every morning.     potassium chloride SA (KLOR-CON M) 20 MEQ tablet TAKE 1 TABLET BY MOUTH TWICE  DAILY 90 tablet 1   sacubitril-valsartan (ENTRESTO) 97-103 MG Take 1 tablet by mouth 2 (  two) times daily. 60 tablet 3   spironolactone (ALDACTONE) 25 MG tablet TAKE 1/2 TABLET BY MOUTH DAILY 15 tablet 11   triamcinolone cream (KENALOG) 0.1 % Apply topically 2 times daily as needed (rash). 30 g 0   varenicline (CHANTIX) 1 MG tablet Take 1 mg by mouth 2 (two) times daily.     No current facility-administered medications on file prior to visit.    BP 110/78   Pulse 65   Temp 98.2 F (36.8 C) (Oral)   Ht 6\' 2"  (1.88 m)   Wt (!) 305 lb (138.3 kg)   SpO2 98%   BMI 39.16 kg/m       Objective:   Physical Exam Vitals and nursing note reviewed.  Constitutional:      General: He is not in acute distress.    Appearance: Normal appearance. He is not ill-appearing.  HENT:     Head: Normocephalic and atraumatic.     Right Ear: Tympanic membrane, ear canal and external ear normal. There is no impacted cerumen.     Left Ear: Tympanic membrane, ear canal and external ear normal. There is no impacted cerumen.     Nose: Nose normal. No congestion or rhinorrhea.     Mouth/Throat:     Mouth: Mucous membranes are moist.     Pharynx: Oropharynx is clear.  Eyes:     Extraocular Movements: Extraocular movements intact.     Conjunctiva/sclera: Conjunctivae normal.     Pupils: Pupils are equal, round, and reactive to light.  Neck:     Vascular: No carotid bruit.  Cardiovascular:     Rate and Rhythm: Normal rate and regular rhythm.     Pulses: Normal pulses.     Heart sounds: No murmur heard.    No friction rub. No gallop.  Pulmonary:     Effort: Pulmonary effort is normal.     Breath sounds: Normal breath sounds.  Abdominal:     General: Abdomen is flat. Bowel sounds are normal. There is no distension.     Palpations: Abdomen is soft. There is no mass.     Tenderness: There is no abdominal tenderness. There is no guarding or rebound.     Hernia: No hernia is present.  Genitourinary:     Testes:        Right: Tenderness and swelling present.     Comments: Open wound on right side of scrotum, no active drainage noted. He is tender throughout the right testicle with palpation.  Musculoskeletal:        General: Normal range of motion.     Cervical back: Normal range of motion and neck supple.  Lymphadenopathy:     Cervical: No cervical adenopathy.  Skin:    General: Skin is warm and dry.     Capillary Refill: Capillary refill takes less than 2 seconds.  Neurological:     General: No focal deficit present.     Mental Status: He is alert and oriented to person, place, and time.  Psychiatric:        Mood and Affect: Mood normal.        Behavior: Behavior normal.        Thought Content: Thought content normal.        Judgment: Judgment normal.       Assessment & Plan:  1. Routine general medical examination at a health care facility Today patient counseled on age appropriate routine health concerns for screening and prevention, each reviewed and up  to date or declined. Immunizations reviewed and up to date or declined. Labs ordered and reviewed. Risk factors for depression reviewed and negative. Hearing function and visual acuity are intact. ADLs screened and addressed as needed. Functional ability and level of safety reviewed and appropriate. Education, counseling and referrals performed based on assessed risks today. Patient provided with a copy of personalized plan for preventive services. - Follow up in one year or sooner if needed  2. Essential hypertension - Well controlled. No change in medications  - Lipid panel; Future - TSH; Future - CBC; Future - Comprehensive metabolic panel; Future - PSA; Future  3. Hypertrophic obstructive cardiomyopathy (HCC) - Per cardiology  - Lipid panel; Future - TSH; Future - CBC; Future - Comprehensive metabolic panel; Future - PSA; Future  4. OSA (obstructive sleep apnea) - Continue CPAP - Lipid panel; Future - TSH;  Future - CBC; Future - Comprehensive metabolic panel; Future - PSA; Future  5. Alcohol abuse - Encouraged to refrain from alcohol consumption  - Lipid panel; Future - TSH; Future - CBC; Future - Comprehensive metabolic panel; Future - PSA; Future  6. Tobacco abuse  - Congratulated on quitting smoking  - Lipid panel; Future - TSH; Future - CBC; Future - Comprehensive metabolic panel; Future - PSA; Future  7. Other obesity - Continue to work on weight loss through diet and exercise  - Lipid panel; Future - TSH; Future - CBC; Future - Comprehensive metabolic panel; Future - PSA; Future  8. Chronic diastolic congestive heart failure (HCC) - Per cardiology  - Lipid panel; Future - TSH; Future - CBC; Future - Comprehensive metabolic panel; Future - PSA; Future  9. Scrotal abscess - Continue with antibiotics  - Ambulatory referral to Urology - Lipid panel; Future - TSH; Future - CBC; Future - Comprehensive metabolic panel; Future - PSA; Future  10. Insomnia, unspecified type  - ALPRAZolam (XANAX) 0.5 MG tablet; Take 1 tablet (0.5 mg total) by mouth daily as needed.  Dispense: 90 tablet; Refill: 0  Shirline Frees, NP

## 2022-12-24 ENCOUNTER — Other Ambulatory Visit (HOSPITAL_COMMUNITY): Payer: Self-pay | Admitting: Internal Medicine

## 2023-01-16 ENCOUNTER — Other Ambulatory Visit: Payer: Self-pay | Admitting: Adult Health

## 2023-01-19 ENCOUNTER — Other Ambulatory Visit (HOSPITAL_COMMUNITY): Payer: Self-pay | Admitting: Cardiology

## 2023-01-19 MED ORDER — DAPAGLIFLOZIN PROPANEDIOL 10 MG PO TABS: ORAL_TABLET | ORAL | 0 refills | Status: DC

## 2023-01-19 NOTE — Telephone Encounter (Signed)
Pt reports he works in [facility] during the week and visit home/[place] on the weekend Does not wish to change providers just needs scripts at local pharmacy as getting refills on the weekend can be a hassle  -meds sent Advised to schedule follow up

## 2023-01-29 ENCOUNTER — Other Ambulatory Visit (HOSPITAL_COMMUNITY): Payer: Self-pay | Admitting: Cardiology

## 2023-01-29 MED ORDER — FUROSEMIDE 40 MG PO TABS: ORAL_TABLET | ORAL | 0 refills | Status: DC

## 2023-01-29 MED ORDER — SPIRONOLACTONE 25 MG PO TABS: 12.5000 mg | ORAL_TABLET | Freq: Every day | ORAL | 0 refills | Status: DC

## 2023-01-29 MED ORDER — POTASSIUM CHLORIDE CRYS ER 20 MEQ PO TBCR: 20.0000 meq | EXTENDED_RELEASE_TABLET | Freq: Two times a day (BID) | ORAL | 0 refills | Status: DC

## 2023-01-29 NOTE — Telephone Encounter (Signed)
Pt reports he works in [place] during the week and visit home/[place] on the weekend Does not wish to change providers just needs scripts at local pharmacy as getting refills on the weekend can be a hassle  -meds sent Advised to schedule follow up 

## 2023-02-12 ENCOUNTER — Other Ambulatory Visit (HOSPITAL_COMMUNITY): Payer: Self-pay | Admitting: Cardiology

## 2023-02-12 MED ORDER — APIXABAN 5 MG PO TABS: 5.0000 mg | ORAL_TABLET | Freq: Two times a day (BID) | ORAL | 0 refills | Status: DC

## 2023-02-12 NOTE — Telephone Encounter (Signed)
Pt reports he works in [place] during the week and visit home/[place] on the weekend Does not wish to change providers just needs scripts at local pharmacy as getting refills on the weekend can be a hassle  -meds sent Advised to schedule follow up 

## 2023-02-16 ENCOUNTER — Other Ambulatory Visit (HOSPITAL_COMMUNITY): Payer: Self-pay | Admitting: Internal Medicine

## 2023-02-17 ENCOUNTER — Other Ambulatory Visit (HOSPITAL_COMMUNITY): Payer: Self-pay | Admitting: Cardiology

## 2023-02-17 MED ORDER — CARVEDILOL 3.125 MG PO TABS: 3.1250 mg | ORAL_TABLET | Freq: Two times a day (BID) | ORAL | 0 refills | Status: DC

## 2023-02-17 NOTE — Telephone Encounter (Signed)
  Pt reports he works in [facility] during the week and visit home/[place] on the weekend Does not wish to change providers just needs scripts at local pharmacy as getting refills on the weekend can be a hassle   -meds sent Advised to schedule follow up     Follow up 8/19

## 2023-02-21 ENCOUNTER — Other Ambulatory Visit (HOSPITAL_COMMUNITY): Payer: Self-pay | Admitting: Internal Medicine

## 2023-03-08 ENCOUNTER — Other Ambulatory Visit (HOSPITAL_COMMUNITY): Payer: Self-pay | Admitting: Internal Medicine

## 2023-03-14 ENCOUNTER — Other Ambulatory Visit (HOSPITAL_COMMUNITY): Payer: Self-pay | Admitting: Internal Medicine

## 2023-03-15 ENCOUNTER — Other Ambulatory Visit: Payer: Self-pay | Admitting: Family Medicine

## 2023-03-15 ENCOUNTER — Other Ambulatory Visit (HOSPITAL_COMMUNITY): Payer: Self-pay | Admitting: Internal Medicine

## 2023-04-03 NOTE — Progress Notes (Signed)
ADVANCED HF CLINIC NOTE   Primary Care: Shirline Frees, NP HF Cardiologist: Dr. Gala Romney  HPI: Chad Lyons is a 57 y.o.with history of HFpEF, diverticulitis, HTN, S/P Lap Band, tobacco abuse, and OSA (uses CPAP), and new diagnosis of hypertrophic cardiomyopathy.   Echo in 2018 EF 50-55%.    Admitted 1/23 with a/c CHF. He was diuresed with IV lasix. Echo showed EF 40-45%, +LVH (Dr. Gala Romney read as 50%). cMRI showed LVEF 35%, moderate asymmetric septal hypertrophy, no LVOT, RVEF 42%, findings consistent with hypertrophic cardiomyopathy. He was discharged on GDMT with spiro, Lasix, carvedilol and Entresto; discharge weight 325 lbs.  Hospital follow up up he was in Afib, CVR and volume overloaded. Amio and Eliquis started, DCCV arranged and Lasix increased.  DCCV 09/23/21 to NSR.  Echo 04/25/22: EF 40-45% Severe concentric LVH G1DD Asc Ao 4.5 cm   Zio 2 week (10/23): mostly NSR, 10 runs of NSVT, 5.4% PVC burden  CPX (10/23): mild to moderate function limitation, primarily due to body habitus. Slope 27  Coronary CTA (11/23): Coronary calcium score 1 (59th%), non obs CAD, severe asymmetric septal hypertrophy, mild AoV calcifications (AV calcium score 449)  Today he returns for HF follow up. Overall feeling fine. 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 He is not SOB walking up steps, has occasional dyspnea while rushing but not bothersome. Denies palpitations, CP, dizziness, abnormal bleeding, or PND/Orthopnea. Appetite ok. No fever or chills. Weight at home 288 pounds. Taking all medications. He is now separate from wife. Occasionally vaping, no cigs/cigars. Drinks 2 ETOH/night. Wearing CPAP. Has not seen Dr. Jomarie Longs.  FH: mother has Afib, brother has MI and AF  Cardiac Studies:  - CPX (11/23):  Pre-Exercise PFTs  FVC 3.17 (68%)      FEV1 2.42 (67%)        FEV1/FVC 76 (98%)        MVV 119 (75%)   Peak VO2: 16.4 (74% predicted peak VO2)  VE/VCO2 slope:  27  OUES: 2.79  Peak RER: 1.15    - Echo (9/23): EF 40-45%, severe concentric LVH, G1DD, Asc Ao 4.5 cm   - Echo (1/23): EF 40-45%, + LVH  - cMRI (1/23): LVEF 35%, RVEF 42%, study consistent w HCM.  Past Medical History:  Diagnosis Date   Diverticulitis    HX OF   EKG, abnormal    HX OF LEFT ANTERIO FASCICULAR BLOCK ON 02-15-15 EKG   GERD (gastroesophageal reflux disease)    Heart murmur    NOW RESOLVED   Hypertension    OSA (obstructive sleep apnea) 11/27/2016   Ventral incisional hernia    Current Outpatient Medications  Medication Sig Dispense Refill   acetaminophen (TYLENOL) 500 MG tablet Take 1,000 mg by mouth every 6 (six) hours as needed for headache (pain).     ALPRAZolam (XANAX) 0.5 MG tablet Take 1 tablet (0.5 mg total) by mouth daily as needed. 90 tablet 0   apixaban (ELIQUIS) 5 MG TABS tablet TAKE 1 TABLET BY MOUTH TWICE A DAY 60 tablet 11   b complex vitamins capsule Take 1 capsule by mouth daily.     carvedilol (COREG) 3.125 MG tablet TAKE 1 TABLET BY MOUTH TWICE A DAY WITH A MEAL 180 tablet 1   Cholecalciferol (VITAMIN D3 PO) Take 1 capsule by mouth daily.     FARXIGA 10 MG TABS tablet TAKE 1 TABLET (10 MG TOTAL) BY MOUTH DAILY. PLEASE CALL OFFICE TO SCHEDULE AN APPOINTMENT 30 tablet 0   fluticasone (  FLONASE) 50 MCG/ACT nasal spray Place 2 sprays into both nostrils daily. (Patient taking differently: Place 2 sprays into both nostrils daily as needed for allergies.) 48 g 1   furosemide (LASIX) 40 MG tablet TAKE 2 TABLETS BY MOUTH EACH MORNING AND ONE EVERY EVENING 270 tablet 3   Multiple Vitamin (MULTIVITAMIN WITH MINERALS) TABS tablet Take 1 tablet by mouth every morning.     potassium chloride SA (KLOR-CON M20) 20 MEQ tablet TAKE 1 TABLET BY MOUTH TWICE A DAY 180 tablet 3   sacubitril-valsartan (ENTRESTO) 97-103 MG Take 1 tablet by mouth 2 (two) times daily. 60 tablet 3   spironolactone (ALDACTONE) 25 MG tablet TAKE 1/2 TABLET BY MOUTH EVERY DAY 45 tablet 3   triamcinolone cream (KENALOG) 0.1 %  APPLY TOPICALLY 2 TIMES DAILY AS NEEDED (RASH). 30 g 0   No current facility-administered medications for this encounter.   Allergies  Allergen Reactions   Hydromet [Hydrocodone Bit-Homatrop Mbr] Itching   Iohexol Hives and Itching    PT DEVELOPS ITCHING AND HIVES SEVERAL HOURS AFTER INJECTION OF OMNIPAQUE    Iodine-131 Itching   Penicillins Itching    Has patient had a PCN reaction causing immediate rash, facial/tongue/throat swelling, SOB or lightheadedness with hypotension: Yes Has patient had a PCN reaction causing severe rash involving mucus membranes or skin necrosis: No Has patient had a PCN reaction that required hospitalization: No Has patient had a PCN reaction occurring within the last 10 years: No If all of the above answers are "NO", then may proceed with Cephalosporin use.    Red Dye #40 (Allura Red) Itching   Social History   Socioeconomic History   Marital status: Married    Spouse name: Chad Lyons   Number of children: 1   Years of education: Not on file   Highest education level: Bachelor's degree (e.g., BA, AB, BS)  Occupational History   Occupation: Sales  Tobacco Use   Smoking status: Former    Current packs/day: 0.50    Types: Cigarettes   Smokeless tobacco: Never   Tobacco comments:    Current Cigars 1 every other day -----QUIT cigarettes in 2006>> 1/2PPD x 15 years  Vaping Use   Vaping status: Never Used  Substance and Sexual Activity   Alcohol use: Yes    Alcohol/week: 40.0 standard drinks of alcohol    Types: 40 Shots of liquor per week    Comment: 6-7 shots of liquor daily x 8 years. heavier on weekend   Drug use: No   Sexual activity: Not on file    Comment: still smokes cigars  Other Topics Concern   Not on file  Social History Narrative   Not on file   Social Determinants of Health   Financial Resource Strain: Low Risk  (09/05/2021)   Overall Financial Resource Strain (CARDIA)    Difficulty of Paying Living Expenses: Not hard at  all  Food Insecurity: No Food Insecurity (08/15/2022)   Hunger Vital Sign    Worried About Running Out of Food in the Last Year: Never true    Ran Out of Food in the Last Year: Never true  Transportation Needs: No Transportation Needs (08/15/2022)   PRAPARE - Administrator, Civil Service (Medical): No    Lack of Transportation (Non-Medical): No  Physical Activity: Sufficiently Active (09/03/2021)   Exercise Vital Sign    Days of Exercise per Week: 5 days    Minutes of Exercise per Session: 30 min  Stress: Stress  Concern Present (09/03/2021)   [employer] of Occupational Health - Occupational Stress Questionnaire    Feeling of Stress : To some extent  Social Connections: Socially Integrated (09/03/2021)   Social Connection and Isolation Panel [NHANES]    Frequency of Communication with Friends and Family: More than three times a week    Frequency of Social Gatherings with Friends and Family: Once a week    Attends Religious Services: More than 4 times per year    Active Member of [employer] or Organizations: Yes    Attends Engineer, structural: More than 4 times per year    Marital Status: Married  Catering manager Violence: Not on file   Family History  Problem Relation Age of Onset   Heart disease Mother    High blood pressure Mother    Diabetes Mother    Hypertension Brother    Heart failure Brother    Stroke Brother    Heart disease Maternal Aunt    Cancer Maternal Uncle        pancreatic   Heart disease Maternal Uncle    Rheum arthritis Maternal Grandmother    High blood pressure Maternal Grandmother    Colon cancer Neg Hx    BP 120/78   Pulse (!) 57   Wt 135.2 kg (298 lb)   SpO2 99%   BMI 38.26 kg/m   Wt Readings from Last 3 Encounters:  04/06/23 135.2 kg (298 lb)  12/18/22 (!) 138.3 kg (305 lb)  12/11/22 (!) 140.3 kg (309 lb 6.4 oz)   PHYSICAL EXAM: General:  NAD. No resp difficulty, walked into clinic HEENT: Normal Neck: Supple. No  JVD. Carotids 2+ bilat; no bruits. No lymphadenopathy or thryomegaly appreciated. Cor: PMI nondisplaced. Regular rate & rhythm. No rubs, gallops or murmurs. Lungs: Clear Abdomen: Soft, obese, nontender, nondistended. No hepatosplenomegaly. No bruits or masses. Good bowel sounds. Extremities: No cyanosis, clubbing, rash, edema Neuro: Alert & oriented x 3, cranial nerves grossly intact. Moves all 4 extremities w/o difficulty. Affect pleasant.  ECG (personally reviewed): NSR 63 bpm, IVCD, + LVH w/ PVC   ASSESSMENT & PLAN: 1. Chronic HFmrEF, with HCM - Echo 2018 EF 50-55% - Echo 09/05/21 EF 40-45% + LVH - cMRI (1/23): LVEF 35%, RVEF 42%, study consistent w HCM.  Septal thickness 2.4 cm  - Echo 04/25/22: EF 40-45% Severe concentric LVH G1DD Asc Ao 4.5 cm No LVOT gradient  - CPX (11/23):  VE/VCO2 slope:  27, OUES: 2.79, Peak RER: 1.15; mild functional impairment, primarily limited by body habitus, pre-exercise spiro appears restrictive lung physiology  - Will re-refer to Dr. Jomarie Longs for genetic testing. First degree relatives will need genetic testing - Improved NYHA I-II, volume looks good. - Continue Farxiga 10 mg daily. - Continue Lasix 80/40 + 20 KCL daily. - Continue carvedilol 3.125 mg bid. - Continue Entresto 97/103 mg bid. - Continue spiro 12.5 mg daily. - No high risk features for SCD identified at this point . CPX reassuring regarding risk for SCD - F/u with genetic testing, we discussed this again today. - Labs today.  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 - CHADs-VASC: 2 - s/p DCCV 09/23/21--> NSR. - Remains in NSR. Off amio. If AF recurs -> ablation - Continue Eliquis 5 mg bid. No bleeding issues - Continue CPAP.  - CBC today  3. Aortic root dilation - 4.5 cm on 9/23 echo.  - CTA coronaries and aortic root (43 mm) - Follow with yearly echo.    4.  OSA - Continue CPAP nightly. - Continue with weight loss.    5.  HTN, severe  - Much improved. - Meds as above  6. Obesity  - s/p  lap band in 2012. - Body mass index is 38.26 kg/m. - Weight down almost 20 lbs since last visit. Congratulated - Consider [id number].   7. Tobacco abuse/ETOH Abuse - Quit smoking 2/23  - Discussed cutting back on ETOH, ideally quitting.  Update echo. Follow up in 6 months with Dr. Gala Romney.  Jacklynn Ganong, FNP  8:45 AM

## 2023-04-06 ENCOUNTER — Encounter (HOSPITAL_COMMUNITY): Payer: Self-pay

## 2023-04-06 ENCOUNTER — Ambulatory Visit (HOSPITAL_COMMUNITY)
Admission: RE | Admit: 2023-04-06 | Discharge: 2023-04-06 | Disposition: A | Discharge status: Home / Self Care | Payer: BC Managed Care – PPO | Source: Ambulatory Visit | Attending: Family Medicine | Admitting: Family Medicine

## 2023-04-06 VITALS — BP 120/78 | HR 57 | Wt 298.0 lb

## 2023-04-06 DIAGNOSIS — I10 Essential (primary) hypertension: Secondary | ICD-10-CM

## 2023-04-06 DIAGNOSIS — F1010 Alcohol abuse, uncomplicated: Secondary | ICD-10-CM

## 2023-04-06 DIAGNOSIS — I2510 Atherosclerotic heart disease of native coronary artery without angina pectoris: Secondary | ICD-10-CM | POA: Diagnosis not present

## 2023-04-06 DIAGNOSIS — Z9884 Bariatric surgery status: Secondary | ICD-10-CM | POA: Insufficient documentation

## 2023-04-06 DIAGNOSIS — Z79899 Other long term (current) drug therapy: Secondary | ICD-10-CM | POA: Insufficient documentation

## 2023-04-06 DIAGNOSIS — I5042 Chronic combined systolic (congestive) and diastolic (congestive) heart failure: Secondary | ICD-10-CM | POA: Insufficient documentation

## 2023-04-06 DIAGNOSIS — G4733 Obstructive sleep apnea (adult) (pediatric): Secondary | ICD-10-CM | POA: Diagnosis not present

## 2023-04-06 DIAGNOSIS — I110 Hypertensive heart disease with heart failure: Secondary | ICD-10-CM | POA: Diagnosis not present

## 2023-04-06 DIAGNOSIS — I4891 Unspecified atrial fibrillation: Secondary | ICD-10-CM | POA: Diagnosis not present

## 2023-04-06 DIAGNOSIS — I422 Other hypertrophic cardiomyopathy: Secondary | ICD-10-CM

## 2023-04-06 DIAGNOSIS — Z6838 Body mass index (BMI) 38.0-38.9, adult: Secondary | ICD-10-CM | POA: Insufficient documentation

## 2023-04-06 DIAGNOSIS — I459 Conduction disorder, unspecified: Secondary | ICD-10-CM | POA: Diagnosis not present

## 2023-04-06 DIAGNOSIS — Z7984 Long term (current) use of oral hypoglycemic drugs: Secondary | ICD-10-CM | POA: Diagnosis not present

## 2023-04-06 DIAGNOSIS — I5022 Chronic systolic (congestive) heart failure: Secondary | ICD-10-CM | POA: Diagnosis not present

## 2023-04-06 DIAGNOSIS — Z7901 Long term (current) use of anticoagulants: Secondary | ICD-10-CM | POA: Diagnosis not present

## 2023-04-06 DIAGNOSIS — I480 Paroxysmal atrial fibrillation: Secondary | ICD-10-CM | POA: Insufficient documentation

## 2023-04-06 DIAGNOSIS — I77810 Thoracic aortic ectasia: Secondary | ICD-10-CM | POA: Diagnosis not present

## 2023-04-06 DIAGNOSIS — Z87891 Personal history of nicotine dependence: Secondary | ICD-10-CM | POA: Diagnosis not present

## 2023-04-06 DIAGNOSIS — E669 Obesity, unspecified: Secondary | ICD-10-CM | POA: Insufficient documentation

## 2023-04-06 LAB — BASIC METABOLIC PANEL
Anion gap: 10 (ref 5–15)
BUN: 15 mg/dL (ref 6–20)
CO2: 27 mmol/L (ref 22–32)
Calcium: 9 mg/dL (ref 8.9–10.3)
Chloride: 100 mmol/L (ref 98–111)
Creatinine, Ser: 1.17 mg/dL (ref 0.61–1.24)
GFR, Estimated: >60 mL/min (ref >60)
Glucose, Bld: 89 mg/dL (ref 70–99)
Potassium: 3.7 mmol/L (ref 3.5–5.1)
Sodium: 137 mmol/L (ref 135–145)

## 2023-04-06 LAB — CBC
HCT: 48 % (ref 39.0–52.0)
Hemoglobin: 15.6 g/dL (ref 13.0–17.0)
MCH: 30.8 pg (ref 26.0–34.0)
MCHC: 32.5 g/dL (ref 30.0–36.0)
MCV: 94.9 fL (ref 80.0–100.0)
Platelets: 197 10*3/uL (ref 150–400)
RBC: 5.06 MIL/uL (ref 4.22–5.81)
RDW: 12.3 % (ref 11.5–15.5)
WBC: 6.8 10*3/uL (ref 4.0–10.5)
nRBC: 0 % (ref 0.0–0.2)

## 2023-04-06 LAB — BRAIN NATRIURETIC PEPTIDE: B Natriuretic Peptide: 277.4 pg/mL — ABNORMAL HIGH (ref 0.0–100.0)

## 2023-04-06 MED ORDER — APIXABAN 5 MG PO TABS: 5.0000 mg | ORAL_TABLET | Freq: Two times a day (BID) | ORAL | 3 refills | Status: DC

## 2023-04-06 MED ORDER — CARVEDILOL 3.125 MG PO TABS: 3.1250 mg | ORAL_TABLET | Freq: Two times a day (BID) | ORAL | 3 refills | Status: DC

## 2023-04-06 MED ORDER — POTASSIUM CHLORIDE CRYS ER 20 MEQ PO TBCR: 20.0000 meq | EXTENDED_RELEASE_TABLET | Freq: Two times a day (BID) | ORAL | 3 refills | Status: DC

## 2023-04-06 MED ORDER — DAPAGLIFLOZIN PROPANEDIOL 10 MG PO TABS: 10.0000 mg | ORAL_TABLET | Freq: Every day | ORAL | 3 refills | Status: DC

## 2023-04-06 MED ORDER — FUROSEMIDE 40 MG PO TABS: ORAL_TABLET | ORAL | 3 refills | Status: DC

## 2023-04-06 MED ORDER — SPIRONOLACTONE 25 MG PO TABS: 12.5000 mg | ORAL_TABLET | Freq: Every day | ORAL | 3 refills | Status: DC

## 2023-04-06 MED ORDER — ENTRESTO 97-103 MG PO TABS: 1.0000 | ORAL_TABLET | Freq: Two times a day (BID) | ORAL | 3 refills | Status: DC

## 2023-04-06 NOTE — Patient Instructions (Signed)
EKG done today.  Labs done today. We will contact you only if your labs are abnormal.  Your medications have been refilled.   No medication changes were made. Please continue all current medications as prescribed.  You have been referred to Dr. Sidney Ace with Genetic counseling. Her office will contact you to schedule an appointment.   Your physician recommends that you schedule a follow-up appointment in: 1 month for an echo and in 6 months with Dr. Gala Romney. Please contact our office in December to schedule a February 2025 appointment.   Your physician has requested that you have an echocardiogram. Echocardiography is a painless test that uses sound waves to create images of your heart. It provides your doctor with information about the size and shape of your heart and how well your heart's chambers and valves are working. This procedure takes approximately one hour. There are no restrictions for this procedure. Please do NOT wear cologne, perfume, aftershave, or lotions (deodorant is allowed). Please arrive 15 minutes prior to your appointment time.  If you have any questions or concerns before your next appointment please send [place] a message through Ambia or call our office at [phone].    TO LEAVE A MESSAGE FOR THE NURSE SELECT OPTION 2, PLEASE LEAVE A MESSAGE INCLUDING: YOUR NAME DATE OF BIRTH CALL BACK NUMBER REASON FOR CALL**this is important as we prioritize the call backs  YOU WILL RECEIVE A CALL BACK THE SAME DAY AS LONG AS YOU CALL BEFORE 4:00 PM   Do the following things EVERYDAY: Weigh yourself in the morning before breakfast. Write it down and keep it in a log. Take your medicines as prescribed Eat low salt foods--Limit salt (sodium) to 2000 mg per day.  Stay as active as you can everyday Limit all fluids for the day to less than 2 liters   At the [facility], you and your health needs are our priority. As part of our continuing mission to  provide you with exceptional heart care, we have created designated Provider Care Teams. These Care Teams include your primary Cardiologist (physician) and Advanced Practice Providers (APPs- Physician Assistants and Nurse Practitioners) who all work together to provide you with the care you need, when you need it.   You may see any of the following providers on your designated Care Team at your next follow up: Dr Arvilla Meres Dr Marca Ancona Dr. Marcos Eke, NP Robbie Lis, [place] [facility] [place], [place] Brynda Peon, NP Karle Plumber, PharmD   Please be sure to bring in all your medications bottles to every appointment.    Thank you for choosing [facility]

## 2023-04-21 ENCOUNTER — Other Ambulatory Visit: Payer: Self-pay | Admitting: Adult Health

## 2023-05-04 ENCOUNTER — Other Ambulatory Visit: Payer: Self-pay | Admitting: *Deleted

## 2023-05-04 ENCOUNTER — Ambulatory Visit (HOSPITAL_COMMUNITY)
Admission: RE | Admit: 2023-05-04 | Discharge: 2023-05-04 | Disposition: A | Discharge status: Home / Self Care | Payer: BC Managed Care – PPO | Source: Ambulatory Visit | Attending: Family Medicine | Admitting: Family Medicine

## 2023-05-04 DIAGNOSIS — I272 Pulmonary hypertension, unspecified: Secondary | ICD-10-CM | POA: Insufficient documentation

## 2023-05-04 DIAGNOSIS — G4730 Sleep apnea, unspecified: Secondary | ICD-10-CM | POA: Diagnosis not present

## 2023-05-04 DIAGNOSIS — I5022 Chronic systolic (congestive) heart failure: Secondary | ICD-10-CM

## 2023-05-04 DIAGNOSIS — I422 Other hypertrophic cardiomyopathy: Secondary | ICD-10-CM | POA: Diagnosis not present

## 2023-05-04 DIAGNOSIS — I110 Hypertensive heart disease with heart failure: Secondary | ICD-10-CM | POA: Insufficient documentation

## 2023-05-04 LAB — ECHOCARDIOGRAM COMPLETE
Area-P 1/2: 2.21 cm2
S' Lateral: 3.7 cm
Single Plane A4C EF: 44.8 %

## 2023-05-04 NOTE — Progress Notes (Signed)
  Echocardiogram 2D Echocardiogram has been performed.  Delcie Roch 05/04/2023, 9:52 AM

## 2023-05-22 ENCOUNTER — Telehealth (HOSPITAL_COMMUNITY): Payer: Self-pay

## 2023-05-22 NOTE — Telephone Encounter (Signed)
Called and left patient a voice message to return a call to our to discuss his lab results. In addition a letter has also been placed up front to be mailed.

## 2023-05-24 ENCOUNTER — Other Ambulatory Visit: Payer: Self-pay | Admitting: Adult Health

## 2023-06-10 NOTE — Telephone Encounter (Signed)
Pt returned call Aware of echo results

## 2023-07-01 ENCOUNTER — Ambulatory Visit: Payer: BC Managed Care – PPO | Admitting: Family Medicine

## 2023-07-01 VITALS — BP 100/70 | HR 71 | Temp 98.8°F | Ht 74.0 in | Wt 302.0 lb

## 2023-07-01 DIAGNOSIS — M545 Low back pain, unspecified: Secondary | ICD-10-CM | POA: Diagnosis not present

## 2023-07-01 DIAGNOSIS — R82998 Other abnormal findings in urine: Secondary | ICD-10-CM

## 2023-07-01 LAB — POC URINALSYSI DIPSTICK (AUTOMATED)
Bilirubin, UA: NEGATIVE
Blood, UA: NEGATIVE
Glucose, UA: POSITIVE — AB
Ketones, UA: 5
Leukocytes, UA: NEGATIVE
Nitrite, UA: NEGATIVE
Protein, UA: NEGATIVE
Spec Grav, UA: 1.015 (ref 1.010–1.025)
Urobilinogen, UA: 0.2 U/dL — AB
pH, UA: 5.5 (ref 5.0–8.0)

## 2023-07-01 NOTE — Progress Notes (Signed)
Established Patient Office Visit   Subjective  Patient ID: Chad Lyons, male    DOB: 1966/05/29  Age: 57 y.o. MRN: [id number]  Chief Complaint  Patient presents with   Urinary Tract Infection    Pt is a 57 yo male followed by Shirline Frees, NP and seen for ongoing concern.  Pt with right sided low back pain x 2-3 wks.   took Tylenol for back pain.  Pt notes dark appearing urine.  Denies nausea, vomiting, constipation, dysuria, urinary frequency, urgency, difficulty starting stream, nocturia.  Takes Lasix twice daily for CHF.  Patient states he drinks mostly water (4-5 large yeti cups per day) and occasionally cranberry juice.  Denies prior history kidney stones.  Patient mentions having antibiotics several weeks ago.  Patient travels a lot for work.  Will be in [place] tomorrow and next week.  Recently returned from [place].    Patient Active Problem List   Diagnosis Date Noted   Acute exacerbation of CHF (congestive heart failure) (HCC) 09/04/2021   Hypophosphatemia 02/24/2017   Pulmonary HTN (HCC) 02/23/2017   Mediastinal adenopathy 02/23/2017   Hypokalemia 02/23/2017   Acute diastolic CHF (congestive heart failure) (HCC) 02/23/2017   LVH (left ventricular hypertrophy)    Elevated troponin 02/22/2017   Abnormal LFTs 02/22/2017   Acute CHF (congestive heart failure) (HCC) 02/22/2017   CHF (congestive heart failure) (HCC) 02/21/2017   OSA (obstructive sleep apnea) 11/27/2016   Polyp of ascending colon    Polyp of rectum    Ventral incisional hernia 02/20/2015   Fitting and adjustment of gastric lap band 12/11/2011   ABDOMINAL MASS, RIGHT LOWER QUADRANT 03/17/2007   OBESITY, MORBID 01/22/2007   Essential hypertension 01/22/2007   Hypertrophic obstructive cardiomyopathy (HCC) 01/22/2007   DIVERTICULITIS, HX OF 01/22/2007   Past Medical History:  Diagnosis Date   Diverticulitis    HX OF   EKG, abnormal    HX OF LEFT ANTERIO FASCICULAR BLOCK ON 02-15-15 EKG   GERD  (gastroesophageal reflux disease)    Heart murmur    NOW RESOLVED   Hypertension    OSA (obstructive sleep apnea) 11/27/2016   Ventral incisional hernia    Past Surgical History:  Procedure Laterality Date   CARDIOVERSION N/A 10/03/2021   Procedure: CARDIOVERSION;  Surgeon: Dolores Patty, MD;  Location: [facility] [place] ENDOSCOPY;  Service: Cardiovascular;  Laterality: N/A;   COLON SURGERY  2006   colon resection for polyps   COLONOSCOPY WITH PROPOFOL N/A 10/31/2016   Procedure: COLONOSCOPY WITH PROPOFOL;  Surgeon: Napoleon Form, MD;  Location: [facility] ENDOSCOPY;  Service: Endoscopy;  Laterality: N/A;   HIATAL HERNIA REPAIR  1991   INSERTION OF MESH N/A 02/20/2015   Procedure: INSERTION OF MESH;  Surgeon: Glenna Fellows, MD;  Location: [facility] ORS;  Service: General;  Laterality: N/A;   LAPAROSCOPIC GASTRIC BANDING  2011   VENTRAL HERNIA REPAIR N/A 02/20/2015   Procedure: OPEN REPAIR VENTRAL INCISIONAL HERNIA;  Surgeon: Glenna Fellows, MD;  Location: [facility] ORS;  Service: General;  Laterality: N/A;   WISDOM TOOTH EXTRACTION     Social History   Tobacco Use   Smoking status: Former    Current packs/day: 0.50    Types: Cigarettes   Smokeless tobacco: Never   Tobacco comments:    Current Cigars 1 every other day -----QUIT cigarettes in 2006>> 1/2PPD x 15 years  Vaping Use   Vaping status: Never Used  Substance Use Topics   Alcohol use: Yes    Alcohol/week: 40.0  standard drinks of alcohol    Types: 40 Shots of liquor per week    Comment: 6-7 shots of liquor daily x 8 years. heavier on weekend   Drug use: No   Family History  Problem Relation Age of Onset   Heart disease Mother    High blood pressure Mother    Diabetes Mother    Hypertension Brother    Heart failure Brother    Stroke Brother    Heart disease Maternal Aunt    Cancer Maternal Uncle        pancreatic   Heart disease Maternal Uncle    Rheum arthritis Maternal Grandmother    High blood pressure Maternal Grandmother     Colon cancer Neg Hx    Allergies  Allergen Reactions   Hydrocodone Bit-Homatrop Mbr Itching   Iohexol Hives and Itching    PT DEVELOPS ITCHING AND HIVES SEVERAL HOURS AFTER INJECTION OF OMNIPAQUE    Iodine-131 Itching   Penicillins Itching    Has patient had a PCN reaction causing immediate rash, facial/tongue/throat swelling, SOB or lightheadedness with hypotension: Yes  Has patient had a PCN reaction causing severe rash involving mucus membranes or skin necrosis: No  Has patient had a PCN reaction that required hospitalization: No  Has patient had a PCN reaction occurring within the last 10 years: No  If all of the above answers are "NO", then may proceed with Cephalosporin use.  Has patient had a PCN reaction causing immediate rash, facial/tongue/throat swelling, SOB or lightheadedness with hypotension: Yes, Has patient had a PCN reaction causing severe rash involving mucus membranes or skin necrosis: No, Has patient had a PCN reaction that required hospitalization: No, Has patient had a PCN reaction occurring within the last 10 years: No, If all of the above answers are "NO", then may proceed with Cephalosporin use.   Red Dye #40 (Allura Red) Itching      ROS Negative unless stated above    Objective:     BP 100/70 (BP Location: Left Arm, Patient Position: Sitting, Cuff Size: Large)   Pulse 71   Temp 98.8 F (37.1 C) (Oral)   Ht 6\' 2"  (1.88 m)   Wt (!) 302 lb (137 kg)   SpO2 98%   BMI 38.77 kg/m  BP Readings from Last 3 Encounters:  07/01/23 100/70  04/06/23 120/78  12/18/22 110/78   Wt Readings from Last 3 Encounters:  07/01/23 (!) 302 lb (137 kg)  04/06/23 298 lb (135.2 kg)  12/18/22 (!) 305 lb (138.3 kg)      Physical Exam Constitutional:      General: He is not in acute distress.    Appearance: Normal appearance.  HENT:     Head: Normocephalic and atraumatic.     Nose: Nose normal.     Mouth/Throat:     Mouth: Mucous membranes are moist.  Eyes:      Extraocular Movements: Extraocular movements intact.     Conjunctiva/sclera: Conjunctivae normal.  Cardiovascular:     Rate and Rhythm: Normal rate and regular rhythm.     Heart sounds: Normal heart sounds. No murmur heard.    No gallop.  Pulmonary:     Effort: Pulmonary effort is normal. No respiratory distress.     Breath sounds: Normal breath sounds. No wheezing, rhonchi or rales.  Abdominal:     General: Bowel sounds are normal.     Palpations: Abdomen is soft.     Tenderness: There is no abdominal tenderness. There  is no right CVA tenderness, left CVA tenderness, guarding or rebound.  Skin:    General: Skin is warm and dry.  Neurological:     Mental Status: He is alert and oriented to person, place, and time.      Results for orders placed or performed in visit on 07/01/23  POCT Urinalysis Dipstick (Automated)  Result Value Ref Range   Color, UA yellow    Clarity, UA cloudy    Glucose, UA Positive (A) Negative   Bilirubin, UA neg    Ketones, UA 5    Spec Grav, UA 1.015 1.010 - 1.025   Blood, UA neg    pH, UA 5.5 5.0 - 8.0   Protein, UA Negative Negative   Urobilinogen, UA 0.2 (A) 0.2 or 1.0 E.U./dL   Nitrite, UA neg    Leukocytes, UA Negative Negative      Assessment & Plan:  Dark urine -     POCT Urinalysis Dipstick (Automated) -     Urinalysis, Routine w reflex microscopic -     CBC with Differential/Platelet -     Comprehensive metabolic panel -     Hemoglobin A1c -     Urine Culture  Acute right-sided low back pain without sciatica  Pt presents with acute darkening of urine and a few weeks of right-sided low back pain.  Initial concern for UTI versus renal calculi.  Also consider AKI, AIN, candidiasis.  POC UA largely negative with the exception of glucose however patient is taking [place].  Will obtain UCX, micro, and labs to further evaluate.  If needed based on lab results we will proceed with CT stone study to evaluate for stones or masses.  Continue  intake of fluids.  Given strict precautions.  Return if symptoms worsen or fail to improve.   Deeann Saint, MD

## 2023-07-02 LAB — COMPREHENSIVE METABOLIC PANEL
ALT: 22 U/L (ref 0–53)
AST: 31 U/L (ref 0–37)
Albumin: 4.3 g/dL (ref 3.5–5.2)
Alkaline Phosphatase: 56 U/L (ref 39–117)
BUN: 20 mg/dL (ref 6–23)
CO2: 27 meq/L (ref 19–32)
Calcium: 9.9 mg/dL (ref 8.4–10.5)
Chloride: 101 meq/L (ref 96–112)
Creatinine, Ser: 1.16 mg/dL (ref 0.40–1.50)
GFR: 69.55 mL/min (ref >60.00)
Glucose, Bld: 148 mg/dL — ABNORMAL HIGH (ref 70–99)
Potassium: 4.1 meq/L (ref 3.5–5.1)
Sodium: 136 meq/L (ref 135–145)
Total Bilirubin: 0.8 mg/dL (ref 0.2–1.2)
Total Protein: 7.5 g/dL (ref 6.0–8.3)

## 2023-07-02 LAB — CBC WITH DIFFERENTIAL/PLATELET
Basophils Absolute: 0 10*3/uL (ref 0.0–0.1)
Basophils Relative: 0.7 % (ref 0.0–3.0)
Eosinophils Absolute: 0.1 10*3/uL (ref 0.0–0.7)
Eosinophils Relative: 1.4 % (ref 0.0–5.0)
HCT: 48.4 % (ref 39.0–52.0)
Hemoglobin: 16 g/dL (ref 13.0–17.0)
Lymphocytes Relative: 14.7 % (ref 12.0–46.0)
Lymphs Abs: 1.1 10*3/uL (ref 0.7–4.0)
MCHC: 32.9 g/dL (ref 30.0–36.0)
MCV: 92.3 fL (ref 78.0–100.0)
Monocytes Absolute: 1 10*3/uL (ref 0.1–1.0)
Monocytes Relative: 13.1 % — ABNORMAL HIGH (ref 3.0–12.0)
Neutro Abs: 5.1 10*3/uL (ref 1.4–7.7)
Neutrophils Relative %: 70.1 % (ref 43.0–77.0)
Platelets: 221 10*3/uL (ref 150.0–400.0)
RBC: 5.24 Mil/uL (ref 4.22–5.81)
RDW: 13.5 % (ref 11.5–15.5)
WBC: 7.3 10*3/uL (ref 4.0–10.5)

## 2023-07-02 LAB — URINALYSIS, ROUTINE W REFLEX MICROSCOPIC
Bilirubin Urine: NEGATIVE
Hgb urine dipstick: NEGATIVE
Leukocytes,Ua: NEGATIVE
Nitrite: NEGATIVE
RBC / HPF: NONE SEEN (ref 0–?)
Specific Gravity, Urine: 1.02 (ref 1.000–1.030)
Total Protein, Urine: NEGATIVE
Urine Glucose: >=1000 — AB
Urobilinogen, UA: 0.2 (ref 0.0–1.0)
pH: 6 (ref 5.0–8.0)

## 2023-07-02 LAB — HEMOGLOBIN A1C: Hgb A1c MFr Bld: 5.7 % (ref 4.6–6.5)

## 2023-07-03 ENCOUNTER — Other Ambulatory Visit: Payer: Self-pay | Admitting: Family Medicine

## 2023-07-03 DIAGNOSIS — N3000 Acute cystitis without hematuria: Secondary | ICD-10-CM

## 2023-07-03 LAB — URINE CULTURE
MICRO NUMBER:: 15725438
SPECIMEN QUALITY:: ADEQUATE

## 2023-07-03 MED ORDER — CIPROFLOXACIN HCL 500 MG PO TABS: 500.0000 mg | ORAL_TABLET | Freq: Two times a day (BID) | ORAL | 0 refills | Status: AC

## 2023-07-23 ENCOUNTER — Other Ambulatory Visit: Payer: Self-pay | Admitting: Adult Health

## 2023-08-26 ENCOUNTER — Other Ambulatory Visit: Payer: Self-pay | Admitting: Adult Health

## 2023-09-28 ENCOUNTER — Other Ambulatory Visit: Payer: Self-pay | Admitting: Adult Health

## 2023-11-25 ENCOUNTER — Telehealth (HOSPITAL_COMMUNITY): Payer: Self-pay

## 2023-11-25 NOTE — Telephone Encounter (Signed)
 Called to confirm/remind patient of their appointment at the [facility] on 11/26/2023 9:30.   Appointment:   [x] Confirmed  [] Left mess   [] No answer/No voice mail  [] Phone not in service  Patient reminded to bring all medications and/or complete list.  Confirmed patient has transportation. Gave directions, instructed to utilize valet parking.

## 2023-11-26 ENCOUNTER — Encounter (HOSPITAL_COMMUNITY): Payer: Self-pay

## 2023-11-26 ENCOUNTER — Emergency Department (HOSPITAL_COMMUNITY)

## 2023-11-26 ENCOUNTER — Other Ambulatory Visit: Payer: Self-pay

## 2023-11-26 ENCOUNTER — Inpatient Hospital Stay (HOSPITAL_COMMUNITY)
Admission: EM | Admit: 2023-11-26 | Discharge: 2023-11-27 | DRG: 309 | Disposition: A | Discharge status: Home / Self Care | Attending: Cardiology | Admitting: Cardiology

## 2023-11-26 ENCOUNTER — Ambulatory Visit (HOSPITAL_COMMUNITY)
Admission: RE | Admit: 2023-11-26 | Discharge: 2023-11-26 | Disposition: A | Discharge status: Home / Self Care | Source: Ambulatory Visit | Attending: Adult Health | Admitting: Adult Health

## 2023-11-26 ENCOUNTER — Encounter (HOSPITAL_COMMUNITY): Payer: Self-pay | Admitting: Emergency Medicine

## 2023-11-26 VITALS — BP 70/40 | HR 149 | Ht 74.0 in | Wt 272.2 lb

## 2023-11-26 DIAGNOSIS — R110 Nausea: Secondary | ICD-10-CM | POA: Insufficient documentation

## 2023-11-26 DIAGNOSIS — I77810 Thoracic aortic ectasia: Secondary | ICD-10-CM | POA: Diagnosis present

## 2023-11-26 DIAGNOSIS — Z833 Family history of diabetes mellitus: Secondary | ICD-10-CM

## 2023-11-26 DIAGNOSIS — I5032 Chronic diastolic (congestive) heart failure: Secondary | ICD-10-CM | POA: Diagnosis present

## 2023-11-26 DIAGNOSIS — I4891 Unspecified atrial fibrillation: Principal | ICD-10-CM | POA: Diagnosis present

## 2023-11-26 DIAGNOSIS — I110 Hypertensive heart disease with heart failure: Secondary | ICD-10-CM | POA: Diagnosis present

## 2023-11-26 DIAGNOSIS — Z8261 Family history of arthritis: Secondary | ICD-10-CM

## 2023-11-26 DIAGNOSIS — Z823 Family history of stroke: Secondary | ICD-10-CM

## 2023-11-26 DIAGNOSIS — E861 Hypovolemia: Secondary | ICD-10-CM | POA: Diagnosis present

## 2023-11-26 DIAGNOSIS — I358 Other nonrheumatic aortic valve disorders: Secondary | ICD-10-CM

## 2023-11-26 DIAGNOSIS — Z8249 Family history of ischemic heart disease and other diseases of the circulatory system: Secondary | ICD-10-CM | POA: Diagnosis not present

## 2023-11-26 DIAGNOSIS — F17290 Nicotine dependence, other tobacco product, uncomplicated: Secondary | ICD-10-CM | POA: Diagnosis present

## 2023-11-26 DIAGNOSIS — I480 Paroxysmal atrial fibrillation: Principal | ICD-10-CM | POA: Diagnosis present

## 2023-11-26 DIAGNOSIS — I4460 Unspecified fascicular block: Secondary | ICD-10-CM | POA: Diagnosis present

## 2023-11-26 DIAGNOSIS — I5022 Chronic systolic (congestive) heart failure: Secondary | ICD-10-CM | POA: Diagnosis not present

## 2023-11-26 DIAGNOSIS — Z7901 Long term (current) use of anticoagulants: Secondary | ICD-10-CM | POA: Diagnosis not present

## 2023-11-26 DIAGNOSIS — R42 Dizziness and giddiness: Secondary | ICD-10-CM | POA: Insufficient documentation

## 2023-11-26 DIAGNOSIS — K219 Gastro-esophageal reflux disease without esophagitis: Secondary | ICD-10-CM | POA: Diagnosis present

## 2023-11-26 DIAGNOSIS — N179 Acute kidney failure, unspecified: Secondary | ICD-10-CM | POA: Diagnosis present

## 2023-11-26 DIAGNOSIS — Z888 Allergy status to other drugs, medicaments and biological substances status: Secondary | ICD-10-CM

## 2023-11-26 DIAGNOSIS — Z79899 Other long term (current) drug therapy: Secondary | ICD-10-CM

## 2023-11-26 DIAGNOSIS — R002 Palpitations: Secondary | ICD-10-CM | POA: Diagnosis present

## 2023-11-26 DIAGNOSIS — I493 Ventricular premature depolarization: Secondary | ICD-10-CM | POA: Diagnosis present

## 2023-11-26 DIAGNOSIS — E669 Obesity, unspecified: Secondary | ICD-10-CM | POA: Diagnosis present

## 2023-11-26 DIAGNOSIS — I472 Ventricular tachycardia, unspecified: Secondary | ICD-10-CM | POA: Diagnosis present

## 2023-11-26 DIAGNOSIS — I959 Hypotension, unspecified: Secondary | ICD-10-CM | POA: Diagnosis present

## 2023-11-26 DIAGNOSIS — Z880 Allergy status to penicillin: Secondary | ICD-10-CM

## 2023-11-26 DIAGNOSIS — R0602 Shortness of breath: Secondary | ICD-10-CM | POA: Insufficient documentation

## 2023-11-26 DIAGNOSIS — K5732 Diverticulitis of large intestine without perforation or abscess without bleeding: Secondary | ICD-10-CM | POA: Diagnosis present

## 2023-11-26 DIAGNOSIS — G4733 Obstructive sleep apnea (adult) (pediatric): Secondary | ICD-10-CM | POA: Diagnosis present

## 2023-11-26 DIAGNOSIS — I272 Pulmonary hypertension, unspecified: Secondary | ICD-10-CM | POA: Insufficient documentation

## 2023-11-26 DIAGNOSIS — Z6835 Body mass index (BMI) 35.0-35.9, adult: Secondary | ICD-10-CM

## 2023-11-26 DIAGNOSIS — I2510 Atherosclerotic heart disease of native coronary artery without angina pectoris: Secondary | ICD-10-CM | POA: Diagnosis present

## 2023-11-26 DIAGNOSIS — R001 Bradycardia, unspecified: Secondary | ICD-10-CM | POA: Diagnosis present

## 2023-11-26 DIAGNOSIS — I422 Other hypertrophic cardiomyopathy: Secondary | ICD-10-CM | POA: Diagnosis present

## 2023-11-26 LAB — CBC WITH DIFFERENTIAL/PLATELET
Abs Immature Granulocytes: 0 10*3/uL (ref 0.00–0.07)
Basophils Absolute: 0.1 10*3/uL (ref 0.0–0.1)
Basophils Relative: 1 %
Eosinophils Absolute: 0.1 10*3/uL (ref 0.0–0.5)
Eosinophils Relative: 1 %
HCT: 50.6 % (ref 39.0–52.0)
Hemoglobin: 17 g/dL (ref 13.0–17.0)
Lymphocytes Relative: 13 %
Lymphs Abs: 1.1 10*3/uL (ref 0.7–4.0)
MCH: 31.7 pg (ref 26.0–34.0)
MCHC: 33.6 g/dL (ref 30.0–36.0)
MCV: 94.2 fL (ref 80.0–100.0)
Monocytes Absolute: 0.9 10*3/uL (ref 0.1–1.0)
Monocytes Relative: 10 %
Neutro Abs: 6.5 10*3/uL (ref 1.7–7.7)
Neutrophils Relative %: 75 %
Platelets: 252 10*3/uL (ref 150–400)
RBC: 5.37 MIL/uL (ref 4.22–5.81)
RDW: 13.8 % (ref 11.5–15.5)
WBC: 8.7 10*3/uL (ref 4.0–10.5)
nRBC: 0 % (ref 0.0–0.2)
nRBC: 0 /100{WBCs}

## 2023-11-26 LAB — COMPREHENSIVE METABOLIC PANEL WITH GFR
ALT: 38 U/L (ref 0–44)
AST: 72 U/L — ABNORMAL HIGH (ref 15–41)
Albumin: 3.9 g/dL (ref 3.5–5.0)
Alkaline Phosphatase: 46 U/L (ref 38–126)
Anion gap: 14 (ref 5–15)
BUN: 28 mg/dL — ABNORMAL HIGH (ref 6–20)
CO2: 17 mmol/L — ABNORMAL LOW (ref 22–32)
Calcium: 9.8 mg/dL (ref 8.9–10.3)
Chloride: 106 mmol/L (ref 98–111)
Creatinine, Ser: 2.17 mg/dL — ABNORMAL HIGH (ref 0.61–1.24)
GFR, Estimated: 34 mL/min — ABNORMAL LOW (ref >60)
Glucose, Bld: 117 mg/dL — ABNORMAL HIGH (ref 70–99)
Potassium: 4.8 mmol/L (ref 3.5–5.1)
Sodium: 137 mmol/L (ref 135–145)
Total Bilirubin: 1.4 mg/dL — ABNORMAL HIGH (ref 0.0–1.2)
Total Protein: 7.7 g/dL (ref 6.5–8.1)

## 2023-11-26 LAB — HIV ANTIBODY (ROUTINE TESTING W REFLEX): HIV Screen 4th Generation wRfx: NONREACTIVE

## 2023-11-26 LAB — MAGNESIUM: Magnesium: 2.4 mg/dL (ref 1.7–2.4)

## 2023-11-26 LAB — T4, FREE: Free T4: 1.09 ng/dL (ref 0.61–1.12)

## 2023-11-26 MED ORDER — LACTATED RINGERS IV BOLUS
250.0000 mL | Freq: Once | INTRAVENOUS | Status: AC
  Administered 2023-11-26: 250 mL via INTRAVENOUS

## 2023-11-26 MED ORDER — ACETAMINOPHEN 500 MG PO TABS
1000.0000 mg | ORAL_TABLET | Freq: Four times a day (QID) | ORAL | Status: DC | PRN
Start: 2023-11-26 — End: 2023-11-26

## 2023-11-26 MED ORDER — LIDOCAINE 5 % EX PTCH
1.0000 | MEDICATED_PATCH | CUTANEOUS | Status: DC
  Administered 2023-11-26: 1 via TRANSDERMAL
  Filled 2023-11-26: qty 1

## 2023-11-26 MED ORDER — APIXABAN 5 MG PO TABS
5.0000 mg | ORAL_TABLET | Freq: Two times a day (BID) | ORAL | Status: DC
  Administered 2023-11-26 – 2023-11-27 (×2): 5 mg via ORAL
  Filled 2023-11-26 (×2): qty 1

## 2023-11-26 MED ORDER — AMIODARONE HCL IN DEXTROSE 360-4.14 MG/200ML-% IV SOLN
30.0000 mg/h | INTRAVENOUS | Status: AC
  Administered 2023-11-26: 30 mg/h via INTRAVENOUS
  Filled 2023-11-26 (×2): qty 200

## 2023-11-26 MED ORDER — ALPRAZOLAM 0.5 MG PO TABS: 0.5000 mg | ORAL_TABLET | Freq: Every day | ORAL | Status: DC | PRN

## 2023-11-26 MED ORDER — ETOMIDATE 2 MG/ML IV SOLN
INTRAVENOUS | Status: AC | PRN
  Administered 2023-11-26: 10 mg via INTRAVENOUS

## 2023-11-26 MED ORDER — AMIODARONE LOAD VIA INFUSION
150.0000 mg | Freq: Once | INTRAVENOUS | Status: AC
Start: 2023-11-26 — End: 2023-11-26
  Administered 2023-11-26: 150 mg via INTRAVENOUS
  Filled 2023-11-26: qty 83.34

## 2023-11-26 MED ORDER — SODIUM CHLORIDE 0.9% FLUSH: 3.0000 mL | INTRAVENOUS | Status: DC | PRN

## 2023-11-26 MED ORDER — ETOMIDATE 2 MG/ML IV SOLN: 10.0000 mg | Freq: Once | INTRAVENOUS | Status: DC

## 2023-11-26 MED ORDER — SODIUM CHLORIDE 0.9 % IV SOLN: 250.0000 mL | INTRAVENOUS | Status: AC | PRN

## 2023-11-26 MED ORDER — AMIODARONE HCL IN DEXTROSE 360-4.14 MG/200ML-% IV SOLN
60.0000 mg/h | INTRAVENOUS | Status: AC
  Administered 2023-11-26: 60 mg/h via INTRAVENOUS
  Filled 2023-11-26: qty 200

## 2023-11-26 MED ORDER — ACETAMINOPHEN 325 MG PO TABS
650.0000 mg | ORAL_TABLET | ORAL | Status: DC | PRN
  Administered 2023-11-27: 650 mg via ORAL
  Filled 2023-11-26: qty 2

## 2023-11-26 MED ORDER — SODIUM CHLORIDE 0.9% FLUSH
3.0000 mL | Freq: Two times a day (BID) | INTRAVENOUS | Status: DC
  Administered 2023-11-26 (×2): 3 mL via INTRAVENOUS
  Administered 2023-11-27: 10 mL via INTRAVENOUS

## 2023-11-26 MED ORDER — LACTATED RINGERS IV BOLUS
500.0000 mL | Freq: Once | INTRAVENOUS | Status: AC
  Administered 2023-11-26: 500 mL via INTRAVENOUS

## 2023-11-26 NOTE — Progress Notes (Signed)
 RT at bedside for the duration of a conscious sedation procedure. Airway cart, ambu-bag, suctioning, and ETCO2 set-up. Patient tolerated well.

## 2023-11-26 NOTE — Procedures (Addendum)
   DIRECT CURRENT CARDIOVERSION  NAME:  Chad Lyons    MRN: [id number] DOB:  [birth year]    ADMIT DATE: 11/26/2023  CARDIOVERSION:     Indications:  Symptomatic Atrial Fibrillation  Procedure Details:  Chad Lyons presented with hypotension associated with atrial fibrillation with RVR. He confirmed that he has not missed any doses of his anticoagulation. He agreed to proceed with cardioversion due to hemodynamically unstable atrial fibrillation.  The patient had the defibrillator pads placed in the anterior and posterior position. Once an appropriate level of sedation was confirmed, the patient was cardioverted x 1 with 200J of biphasic synchronized energy.  The patient converted to NSR.  There were no apparent complications.  The patient had normal neuro status and respiratory status post procedure with vitals stable as recorded elsewhere.  Adequate airway was maintained throughout and vital signs monitored per protocol. Sedation by emergency department staff.  Kateri Balch [facility] 11:13 AM

## 2023-11-26 NOTE — ED Triage Notes (Signed)
 Pt brought [facility] and vascular center in Afib RVR and BP 70/40. Pt recently started on metoprolol 25 mg, pt has not taken for 2 days due to feeling dizzy and drowsy.

## 2023-11-26 NOTE — Progress Notes (Signed)
 Heart Failure Navigator Progress Note  Assessed for Heart & Vascular TOC clinic readiness.  Patient does not meet criteria due to Advanced Heart Failure Team patient of Dr. Gala Romney. .   Navigator will sign off at this time.   Rhae Hammock, BSN, Scientist, clinical (histocompatibility and immunogenetics) Only

## 2023-11-26 NOTE — Plan of Care (Signed)

## 2023-11-26 NOTE — ED Provider Notes (Signed)
 [place] EMERGENCY DEPARTMENT AT [facility] Provider Note   CSN: [id number] Arrival date & time: 11/26/23  1005     History  Chief Complaint  Patient presents with   Atrial Fibrillation    Chad Lyons is a 58 y.o. male.  HPI 58 year old male presents from the cardiology clinic with A-fib.  Patient first went into A-fib a couple weeks ago when he was in [place].  He was kept overnight in the hospital due to low blood pressure.  He was put on new meds including metoprolol.  However since being on the metoprolol he has felt more fatigued and tired and dizzy and he did not take it today.  He has chronically been on Eliquis for years and has not missed any doses.  He felt himself go back into A-fib yesterday (he had converted while in the hospital a couple weeks ago) and has been having some shortness of breath ever since.  No chest pain.  Went to the cardiology clinic today for a visit and then they sent him down here due to A-fib with RVR.  Home Medications Prior to Admission medications   Medication Sig Start Date End Date Taking? Authorizing Provider  ALPRAZolam Prudy Feeler) 0.5 MG tablet Take 0.5 mg by mouth daily as needed for anxiety.   Yes [provider]  apixaban (ELIQUIS) 5 MG TABS tablet Take 1 tablet (5 mg total) by mouth 2 (two) times daily. 04/06/23  Yes Milford, Anderson Malta, FNP  B Complex Vitamins (B COMPLEX PO) Take 1 each by mouth daily. OTC B-Complex gummy supplement.   Yes [provider]  Cholecalciferol (VITAMIN D-3 PO) Take 1 each by mouth daily. OTC Vitamin D3 gummy supplement   Yes [provider]  dapagliflozin propanediol (FARXIGA) 10 MG TABS tablet Take 1 tablet (10 mg total) by mouth daily. 04/06/23  Yes Milford, Anderson Malta, FNP  furosemide (LASIX) 40 MG tablet TAKE 2 TABLETS BY MOUTH EACH MORNING AND ONE EVERY EVENING Patient taking differently: Take 40 mg by mouth daily. TAKE 2 TABLETS BY MOUTH EACH MORNING AND ONE EVERY  EVENING 04/06/23  Yes Milford, Anderson Malta, FNP  meloxicam (MOBIC) 15 MG tablet Take 15 mg by mouth daily as needed for pain.   Yes [provider]  metoprolol succinate (TOPROL-XL) 25 MG 24 hr tablet Take 25 mg by mouth daily.   Yes [provider]  Multiple Vitamins-Minerals (MENS MULTIVITAMIN GUMMIES) CHEW Chew 1 each by mouth daily.   Yes [provider]  OVER THE COUNTER MEDICATION Take 1 each by mouth daily. OTC Keto + ACV gummy supplement   Yes [provider]  potassium chloride SA (KLOR-CON M20) 20 MEQ tablet Take 1 tablet (20 mEq total) by mouth 2 (two) times daily. 04/06/23  Yes Milford, Anderson Malta, FNP  sacubitril-valsartan (ENTRESTO) 97-103 MG Take 1 tablet by mouth 2 (two) times daily. 04/06/23  Yes Milford, Anderson Malta, FNP  spironolactone (ALDACTONE) 25 MG tablet Take 0.5 tablets (12.5 mg total) by mouth daily. 04/06/23  Yes Milford, Anderson Malta, FNP  triamcinolone cream (KENALOG) 0.1 % APPLY TOPICALLY 2 TIMES DAILY AS NEEDED (RASH). 10/01/23  Yes Nafziger, Kandee Keen, NP      Allergies    Hydromet [hydrocodone bit-homatrop mbr], Omnipaque [iohexol], Iodine-131, Penicillins, and Red dye #40 (allura red)    Review of Systems   Review of Systems  Respiratory:  Positive for shortness of breath.   Cardiovascular:  Positive for palpitations. Negative for chest pain  and leg swelling.  Neurological:  Positive for dizziness and light-headedness.    Physical Exam Updated Vital Signs BP 95/79 (BP Location: Left Arm)   Pulse 65   Temp 98.2 F (36.8 C) (Oral)   Resp 18   Ht 6\' 2"  (1.88 m)   Wt 123.8 kg   SpO2 99%   BMI 35.04 kg/m  Physical Exam Vitals and nursing note reviewed.  Constitutional:      General: He is not in acute distress.    Appearance: He is well-developed. He is obese. He is not ill-appearing or diaphoretic.  HENT:     Head: Normocephalic and atraumatic.  Cardiovascular:     Rate and Rhythm: Tachycardia present. Rhythm irregular.      Heart sounds: Normal heart sounds.  Pulmonary:     Effort: Pulmonary effort is normal.     Breath sounds: Normal breath sounds. No wheezing.  Abdominal:     General: There is no distension.  Musculoskeletal:     Right lower leg: No edema.     Left lower leg: No edema.  Skin:    General: Skin is warm and dry.  Neurological:     Mental Status: He is alert.     ED Results / Procedures / Treatments   Labs (all labs ordered are listed, but only abnormal results are displayed) Labs Reviewed  COMPREHENSIVE METABOLIC PANEL WITH GFR - Abnormal; Notable for the following components:      Result Value   CO2 17 (*)    Glucose, Bld 117 (*)    BUN 28 (*)    Creatinine, Ser 2.17 (*)    AST 72 (*)    Total Bilirubin 1.4 (*)    GFR, Estimated 34 (*)    All other components within normal limits  CBC WITH DIFFERENTIAL/PLATELET  MAGNESIUM  T4, FREE  HIV ANTIBODY (ROUTINE TESTING W REFLEX)    EKG EKG Interpretation Date/Time:  Thursday November 26 2023 10:46:26 EDT Ventricular Rate:  84 PR Interval:  180 QRS Duration:  131 QT Interval:  346 QTC Calculation: 409 R Axis:   -73  Text Interpretation: Sinus rhythm Ventricular premature complex Nonspecific IVCD with LAD Anteroseptal infarct, old Abnormal T, consider ischemia, lateral leads afib/rapid rate has now resolved Confirmed by Pricilla Loveless [phone]) on 11/26/2023 12:51:00 PM  Radiology DG Chest Portable 1 View Result Date: 11/26/2023 CLINICAL DATA:  afib, dyspnea. EXAM: PORTABLE CHEST 1 VIEW COMPARISON:  09/04/2021. FINDINGS: Low lung volume. Bilateral lung fields are clear. Bilateral costophrenic angles are clear. Normal cardio-mediastinal silhouette. No acute osseous abnormalities. The soft tissues are within normal limits. IMPRESSION: *No active disease. Electronically Signed   By: Jules Schick M.D.   On: 11/26/2023 11:45    Procedures .Critical Care  Performed by: Pricilla Loveless, MD Authorized by: Pricilla Loveless, MD    Critical care provider statement:    Critical care time (minutes):  35   Critical care time was exclusive of:  Separately billable procedures and treating other patients   Critical care was necessary to treat or prevent imminent or life-threatening deterioration of the following conditions:  Cardiac failure, circulatory failure and shock   Critical care was time spent personally by me on the following activities:  Development of treatment plan with patient or surrogate, discussions with consultants, evaluation of patient's response to treatment, examination of patient, ordering and review of laboratory studies, ordering and review of radiographic studies, ordering and performing treatments and interventions, pulse oximetry, re-evaluation of patient's condition and  review of old charts .Sedation  Date/Time: 11/26/2023 10:58 AM  Performed by: Pricilla Loveless, MD Authorized by: Pricilla Loveless, MD   Consent:    Consent obtained:  Verbal and written   Consent given by:  Patient Universal protocol:    Immediately prior to procedure, a time out was called: yes     Patient identity confirmed:  Verbally with patient Indications:    Procedure performed:  Cardioversion   Procedure necessitating sedation performed by:  Different physician Pre-sedation assessment:    Time since last food or drink:  10+ hours   ASA classification: class 3 - patient with severe systemic disease     Mallampati score:  I - soft palate, uvula, fauces, pillars visible   Pre-sedation assessments completed and reviewed: airway patency, cardiovascular function, hydration status, mental status, nausea/vomiting, pain level, respiratory function and temperature   A pre-sedation assessment was completed prior to the start of the procedure Immediate pre-procedure details:    Reassessment: Patient reassessed immediately prior to procedure     Reviewed: vital signs, relevant labs/tests and NPO status     Verified: bag valve mask  available, emergency equipment available, intubation equipment available, IV patency confirmed, oxygen available and suction available   Procedure details (see MAR for exact dosages):    Preoxygenation:  Nasal cannula   Sedation:  Etomidate   Intended level of sedation: deep   Analgesia:  None   Intra-procedure monitoring:  Blood pressure monitoring, cardiac monitor, continuous pulse oximetry, continuous capnometry, frequent LOC assessments and frequent vital sign checks   Intra-procedure events: none     Total Provider sedation time (minutes):  10 Post-procedure details:   A post-sedation assessment was completed following the completion of the procedure.   Attendance: Constant attendance by certified staff until patient recovered     Recovery: Patient returned to pre-procedure baseline     Post-sedation assessments completed and reviewed: airway patency, cardiovascular function, hydration status, mental status, nausea/vomiting, pain level, respiratory function and temperature     Patient is stable for discharge or admission: yes     Procedure completion:  Tolerated well, no immediate complications     Medications Ordered in ED Medications  amiodarone (NEXTERONE) 1.8 mg/mL load via infusion 150 mg (150 mg Intravenous Bolus from Bag 11/26/23 1032)    Followed by  amiodarone (NEXTERONE PREMIX) 360-4.14 MG/200ML-% (1.8 mg/mL) IV infusion (60 mg/hr Intravenous Handoff 11/26/23 1348)    Followed by  amiodarone (NEXTERONE PREMIX) 360-4.14 MG/200ML-% (1.8 mg/mL) IV infusion (has no administration in time range)  ALPRAZolam (XANAX) tablet 0.5 mg (has no administration in time range)  apixaban (ELIQUIS) tablet 5 mg (has no administration in time range)  sodium chloride flush (NS) 0.9 % injection 3 mL (3 mLs Intravenous Given 11/26/23 1358)  sodium chloride flush (NS) 0.9 % injection 3 mL (has no administration in time range)  0.9 %  sodium chloride infusion (has no administration in time range)   acetaminophen (TYLENOL) tablet 650 mg (has no administration in time range)  lactated ringers bolus 500 mL (0 mLs Intravenous Stopped 11/26/23 1113)  etomidate (AMIDATE) injection (10 mg Intravenous Given 11/26/23 1044)    ED Course/ Medical Decision Making/ A&P                                 Medical Decision Making Amount and/or Complexity of Data Reviewed External Data Reviewed: notes. Labs: ordered.    Details: Acute  kidney injury Radiology: ordered and independent interpretation performed.    Details: No CHF ECG/medicine tests: ordered and independent interpretation performed.    Details: A-fib with RVR  Risk Prescription drug management. Decision regarding hospitalization.   Patient presents with A-fib with RVR.  Symptoms recurred yesterday and he has been compliant with Eliquis.  Cardiology is at the bedside shortly after I have seen him and they agree with cardioversion.  See cardiology notes for the cardioversion details.  He was sedated without any obvious complication.  He does have worsening renal function and was given some fluids.  He does have a history of CHF.  Cardiology will admit for observation.        Final Clinical Impression(s) / ED Diagnoses Final diagnoses:  Atrial fibrillation with RVR [facility])    Rx / DC Orders ED Discharge Orders     None         Pricilla Loveless, MD 11/26/23 1601

## 2023-11-26 NOTE — ED Notes (Signed)
 CCMD called and notified

## 2023-11-26 NOTE — H&P (Cosign Needed)
 Advanced Heart Failure Team History and Physical Note   PCP:  Shirline Frees, NP  PCP-Cardiology: Lance Muss, MD    [facility]: Dr. Gala Romney   Reason for Admission: Afib w/ RVR    HPI:     Mr Hymas is a 58 y.o.with history of HFpEF, diverticulitis, HTN, S/P Lap Band, tobacco abuse, and OSA (uses CPAP), and new diagnosis of hypertrophic cardiomyopathy.   Echo in 2018 EF 50-55%.    Admitted 1/23 with a/c CHF. He was diuresed with IV lasix. Echo showed EF 40-45%, +LVH (Dr. Gala Romney read as 50%). cMRI showed LVEF 35%, moderate asymmetric septal hypertrophy, no LVOT, RVEF 42%, findings consistent with hypertrophic cardiomyopathy. He was discharged on GDMT with spiro, Lasix, carvedilol and Entresto; discharge weight 325 lbs.   Hospital follow up up he was in Afib, CVR and volume overloaded. Amio and Eliquis started, DCCV arranged and Lasix increased.   DCCV 09/23/21 to NSR.   Echo 04/25/22: EF 40-45% Severe concentric LVH G1DD Asc Ao 4.5 cm    Zio 2 week (10/23): mostly NSR, 10 runs of NSVT, 5.4% PVC burden   CPX (10/23): mild to moderate function limitation, primarily due to body habitus. Slope 27   Coronary CTA (11/23): Coronary calcium score 1 (59th%), non obs CAD, severe asymmetric septal hypertrophy, mild AoV calcifications (AV calcium score 449).  Pt recently was admitted to [facility] in [facility] last week. Recently relocated there for work. Per notes in Care Everywhere, he presented to the ED w/ CC of chest pain and was found to be in Afib w/ RVR. Was given IV diltiazem and digoxin and oral metoprolol. Fortunately converted to normal sinus rhythm. He is anticoagulated with Eliquis. Echo there showed normal LVEF 55-65%, severe LVH w/ severe interventricular septal wall hypertrophy and moderate posterior  wall hypertrophy noted. RV and IVC size normal. Troponin T was slightly elevated w/ low level trend, 43>>44>>83>>81. TSH was low at 0.21 but free T4 was WNL 1.57. He was discharged  on 4/1.   Several days post d/c, he felt he was back out of rhythm. Symptomatic w/ palpitations. Traveled back to [place] and had scheduled f/u in the [facility] for today. On arrival, he was noted to be in Afib w/ RVR in the 150s and hypotensive, though mentation ok. No respiratory difficulty. He was sent to the ED by rapid response for further management.   While in the ED, he continued w/ V-rates in the 150s and SBPs in the 80s. He denied any misses doses of Eliquis in the last 30 days. He was placed on an amiodarone gtt w/ bolus, sedated and underwent DCCV back to NSR.   He is now being admitted for overnight observation.   On exam, he remains in SB, upper 50s. BP still soft but trending up, now in the 90s systolic. Mentation ok. Denies any current CP or dyspnea. Euvolemic on exam. Took all AM meds prior to coming to clinic today.   Pertinent labs, WBC 8.7, Hgb 17.0, K 4.8, Mg 2.4, CO2 17, BUN 28, SCr 2.17 b/l SCr ~1.2.    Review of Systems: [y] = yes, [ ]  = no   General: Weight gain [ ] ; Weight loss [ ] ; Anorexia [ ] ; Fatigue [ ] ; Fever [ ] ; Chills [ ] ; Weakness [ ]   Cardiac: Chest pain/pressure [ ] ; Resting SOB [ ] ; Exertional SOB [ ] ; Orthopnea [ ] ; Pedal Edema [ ] ; Palpitations [ ] ; Syncope [ ] ; Presyncope [ ] ; Paroxysmal nocturnal dyspnea[ ]   Pulmonary: Cough [ ] ; Wheezing[ ] ; Hemoptysis[ ] ; Sputum [ ] ; Snoring [ ]   GI: Vomiting[ ] ; Dysphagia[ ] ; Melena[ ] ; Hematochezia [ ] ; Heartburn[ ] ; Abdominal pain [ ] ; Constipation [ ] ; Diarrhea [ ] ; BRBPR [ ]   GU: Hematuria[ ] ; Dysuria [ ] ; Nocturia[ ]   Vascular: Pain in legs with walking [ ] ; Pain in feet with lying flat [ ] ; Non-healing sores [ ] ; Stroke [ ] ; TIA [ ] ; Slurred speech [ ] ;  Neuro: Headaches[ ] ; Vertigo[ ] ; Seizures[ ] ; Paresthesias[ ] ;Blurred vision [ ] ; Diplopia [ ] ; Vision changes [ ]   Ortho/Skin: Arthritis [ ] ; Joint pain [ ] ; Muscle pain [ ] ; Joint swelling [ ] ; Back Pain [ ] ; Rash [ ]   Psych: Depression[ ] ; Anxiety[ ]   Heme:  Bleeding problems [ ] ; Clotting disorders [ ] ; Anemia [ ]   Endocrine: Diabetes [ ] ; Thyroid dysfunction[ ]    Home Medications Prior to Admission medications   Medication Sig Start Date End Date Taking? Authorizing Provider  acetaminophen (TYLENOL) 500 MG tablet Take 1,000 mg by mouth every 6 (six) hours as needed for headache (pain).    [provider]  ALPRAZolam Prudy Feeler) 0.5 MG tablet Take 1 tablet (0.5 mg total) by mouth daily as needed. 12/18/22   Nafziger, Kandee Keen, NP  apixaban (ELIQUIS) 5 MG TABS tablet Take 1 tablet (5 mg total) by mouth 2 (two) times daily. 04/06/23   Jacklynn Ganong, FNP  b complex vitamins capsule Take 1 capsule by mouth daily.    [provider]  Cholecalciferol (VITAMIN D3 PO) Take 1 capsule by mouth daily.    [provider]  dapagliflozin propanediol (FARXIGA) 10 MG TABS tablet Take 1 tablet (10 mg total) by mouth daily. 04/06/23   Milford, Anderson Malta, FNP  fluticasone (FLONASE) 50 MCG/ACT nasal spray Place 2 sprays into both nostrils daily. Patient taking differently: Place 2 sprays into both nostrils daily as needed for allergies. 03/06/20   Nafziger, Kandee Keen, NP  furosemide (LASIX) 40 MG tablet TAKE 2 TABLETS BY MOUTH EACH MORNING AND ONE EVERY EVENING 04/06/23   Milford, Anderson Malta, FNP  Multiple Vitamin (MULTIVITAMIN WITH MINERALS) TABS tablet Take 1 tablet by mouth every morning. Patient not taking: Reported on 11/26/2023    [provider]  potassium chloride SA (KLOR-CON M20) 20 MEQ tablet Take 1 tablet (20 mEq total) by mouth 2 (two) times daily. 04/06/23   Milford, Anderson Malta, FNP  sacubitril-valsartan (ENTRESTO) 97-103 MG Take 1 tablet by mouth 2 (two) times daily. 04/06/23   Jacklynn Ganong, FNP  spironolactone (ALDACTONE) 25 MG tablet Take 0.5 tablets (12.5 mg total) by mouth daily. 04/06/23   Milford, Anderson Malta, FNP  triamcinolone cream (KENALOG) 0.1 % APPLY TOPICALLY 2 TIMES DAILY AS NEEDED (RASH). 10/01/23   Shirline Frees, NP     Past Medical History: Past Medical History:  Diagnosis Date   Diverticulitis    HX OF   EKG, abnormal    HX OF LEFT ANTERIO FASCICULAR BLOCK ON 02-15-15 EKG   GERD (gastroesophageal reflux disease)    Heart murmur    NOW RESOLVED   Hypertension    OSA (obstructive sleep apnea) 11/27/2016   Ventral incisional hernia     Past Surgical History: Past Surgical History:  Procedure Laterality Date   CARDIOVERSION N/A 10/03/2021   Procedure: CARDIOVERSION;  Surgeon: Dolores Patty, MD;  Location: [facility] ENDOSCOPY;  Service: Cardiovascular;  Laterality: N/A;   COLON SURGERY  2006   colon resection for polyps  COLONOSCOPY WITH PROPOFOL N/A 10/31/2016   Procedure: COLONOSCOPY WITH PROPOFOL;  Surgeon: Napoleon Form, MD;  Location: [facility] ENDOSCOPY;  Service: Endoscopy;  Laterality: N/A;   HIATAL HERNIA REPAIR  1991   INSERTION OF MESH N/A 02/20/2015   Procedure: INSERTION OF MESH;  Surgeon: Glenna Fellows, MD;  Location: [facility] ORS;  Service: General;  Laterality: N/A;   LAPAROSCOPIC GASTRIC BANDING  2011   VENTRAL HERNIA REPAIR N/A 02/20/2015   Procedure: OPEN REPAIR VENTRAL INCISIONAL HERNIA;  Surgeon: Glenna Fellows, MD;  Location: [facility] ORS;  Service: General;  Laterality: N/A;   WISDOM TOOTH EXTRACTION      Family History:  Family History  Problem Relation Age of Onset   Heart disease Mother    High blood pressure Mother    Diabetes Mother    Hypertension Brother    Heart failure Brother    Stroke Brother    Heart disease Maternal Aunt    Cancer Maternal Uncle        pancreatic   Heart disease Maternal Uncle    Rheum arthritis Maternal Grandmother    High blood pressure Maternal Grandmother    Colon cancer Neg Hx     Social History: Social History   Socioeconomic History   Marital status: Married    Spouse name: Tomoki Lucken   Number of children: 1   Years of education: Not on file   Highest education level: Master's degree (e.g., MA, MS, MEng, MEd, MSW, MBA)   Occupational History   Occupation: Sales  Tobacco Use   Smoking status: Former    Current packs/day: 0.50    Types: Cigarettes   Smokeless tobacco: Never   Tobacco comments:    Current Cigars 1 every other day -----QUIT cigarettes in 2006>> 1/2PPD x 15 years  Vaping Use   Vaping status: Never Used  Substance and Sexual Activity   Alcohol use: Yes    Alcohol/week: 40.0 standard drinks of alcohol    Types: 40 Shots of liquor per week    Comment: 6-7 shots of liquor daily x 8 years. heavier on weekend   Drug use: No   Sexual activity: Not on file    Comment: still smokes cigars  Other Topics Concern   Not on file  Social History Narrative   Not on file   Social Drivers of Health   Financial Resource Strain: Low Risk  (07/01/2023)   Overall Financial Resource Strain (CARDIA)    Difficulty of Paying Living Expenses: Not hard at all  Food Insecurity: No Food Insecurity (07/01/2023)   Hunger Vital Sign    Worried About Running Out of Food in the Last Year: Never true    Ran Out of Food in the Last Year: Never true  Transportation Needs: No Transportation Needs (07/01/2023)   PRAPARE - Administrator, Civil Service (Medical): No    Lack of Transportation (Non-Medical): No  Physical Activity: Insufficiently Active (07/01/2023)   Exercise Vital Sign    Days of Exercise per Week: 3 days    Minutes of Exercise per Session: 10 min  Stress: No Stress Concern Present (07/01/2023)   [employer] of Occupational Health - Occupational Stress Questionnaire    Feeling of Stress : Only a little  Social Connections: Moderately Integrated (07/01/2023)   Social Connection and Isolation Panel [NHANES]    Frequency of Communication with Friends and Family: More than three times a week    Frequency of Social Gatherings with Friends and Family: Twice  a week    Attends Religious Services: More than 4 times per year    Active Member of Clubs or Organizations: Yes    Attends Occupational hygienist Meetings: More than 4 times per year    Marital Status: Separated    Allergies:  Allergies  Allergen Reactions   Hydrocodone Bit-Homatrop Mbr Itching   Iohexol Hives and Itching    PT DEVELOPS ITCHING AND HIVES SEVERAL HOURS AFTER INJECTION OF OMNIPAQUE    Iodine-131 Itching   Penicillins Itching    Has patient had a PCN reaction causing immediate rash, facial/tongue/throat swelling, SOB or lightheadedness with hypotension: Yes  Has patient had a PCN reaction causing severe rash involving mucus membranes or skin necrosis: No  Has patient had a PCN reaction that required hospitalization: No  Has patient had a PCN reaction occurring within the last 10 years: No  If all of the above answers are "NO", then may proceed with Cephalosporin use.  Has patient had a PCN reaction causing immediate rash, facial/tongue/throat swelling, SOB or lightheadedness with hypotension: Yes, Has patient had a PCN reaction causing severe rash involving mucus membranes or skin necrosis: No, Has patient had a PCN reaction that required hospitalization: No, Has patient had a PCN reaction occurring within the last 10 years: No, If all of the above answers are "NO", then may proceed with Cephalosporin use.   Red Dye #40 (Allura Red) Itching    Objective:    Vital Signs:   Temp:  [98.3 F (36.8 C)] 98.3 F (36.8 C) (04/10 1007) Pulse Rate:  [63-155] 78 (04/10 1050) Resp:  [10-21] 21 (04/10 1050) BP: (81-100)/(58-76) 93/64 (04/10 1050) SpO2:  [98 %-100 %] 100 % (04/10 1050) Weight:  [098 kg] 123 kg (04/10 1008)   Filed Weights   11/26/23 1008  Weight: 123 kg     Physical Exam     General:  Well appearing. No respiratory difficulty HEENT: Normal Neck: Supple. no JVD. Carotids 2+ bilat; no bruits. No lymphadenopathy or thyromegaly appreciated. Cor: PMI nondisplaced. Regular rate & rhythm. No rubs, gallops or murmurs. Lungs: Clear Abdomen: Soft, nontender, nondistended. No  hepatosplenomegaly. No bruits or masses. Good bowel sounds. Extremities: No cyanosis, clubbing, rash, edema Neuro: Alert & oriented x 3, cranial nerves grossly intact. moves all 4 extremities w/o difficulty. Affect pleasant.   Telemetry   Post cardioversion, Sinus bradycardia 59 bpm   EKG   Post cardioversion, NSR 84 bpm   Labs     Basic Metabolic Panel: No results for input(s): "NA", "K", "CL", "CO2", "GLUCOSE", "BUN", "CREATININE", "CALCIUM", "MG", "PHOS" in the last 168 hours.  Liver Function Tests: No results for input(s): "AST", "ALT", "ALKPHOS", "BILITOT", "PROT", "ALBUMIN" in the last 168 hours. No results for input(s): "LIPASE", "AMYLASE" in the last 168 hours. No results for input(s): "AMMONIA" in the last 168 hours.  CBC: Recent Labs  Lab 11/26/23 1014  WBC 8.7  NEUTROABS PENDING  HGB 17.0  HCT 50.6  MCV 94.2  PLT 252    Cardiac Enzymes: No results for input(s): "CKTOTAL", "CKMB", "CKMBINDEX", "TROPONINI" in the last 168 hours.  BNP: BNP (last 3 results) Recent Labs    04/06/23 0901  BNP 277.4*    ProBNP (last 3 results) No results for input(s): "PROBNP" in the last 8760 hours.   CBG: No results for input(s): "GLUCAP" in the last 168 hours.  Coagulation Studies: No results for input(s): "LABPROT", "INR" in the last 72 hours.  Imaging: No results found.  Patient Profile   58 y/o male w/ chronic HFmrEF w/ HCM, PAF, OSA on CPAP, HTN, Obesity s/p lap band surgery and aortic root dilation, admitted for hemodynamically unstable atrial fibrillation w/ RVR, requiring urgent DCCV.    Assessment/Plan   1. Afib w/ RVR - initially presented w/ V-rates 150s, hypotensive 80s systolic - s/p urgent DCCV while in the ED back to NSR (denies missed doses of Eliquis) - admit to progressive care unit, monitor on tele - continue amio gtt at 60/hr x 6 hrs>>30 mg hr x 18 hr>>PO amio 200 mg bid for at least short term  - continue Eliquis 5 mg bid  - continue  CPAP at bedtime  - this is 2nd occurrence in the last 4 wks, recommend outpatient referral to EP to consider Afib ablation  - Hold Toprol XL for now w/ bradycardia and soft BP  - Recent Echo at [facility] showed normal LVEF, may benefit from addition of Cardizem given associated HCM once BP improves   2. Chronic HFmrEF, HCM  - Echo 2018 EF 50-55% - Echo 09/05/21 EF 40-45% + LVH - cMRI (1/23): LVEF 35%, RVEF 42%, study consistent w HCM.  Septal thickness 2.4 cm  - Echo 04/25/22: EF 40-45% Severe concentric LVH G1DD Asc Ao 4.5 cm No LVOT gradient  - Echo 11/2023 [facility]) EF 55-65%, severe LVH, RV ok  - Euvolemic-dry on exam. Elevated SCr/BUN and Hgb (17) also suggest he may be a little dry. Will discuss fluid bolus w/ MD.   - Hold diuretics and HF GDMT for now given AKI and soft BP  - has been referred to Dr. Jomarie Longs for genetic testing - per above, would benefit from outpatient EP consultation. ? ICD given HCM  3. AKI - baseline SCr ~1.2. SCr elevated at 2.17 on admit - suspect AKI likely 2/2 hypotension from rapid afib +/- low volume status - hold diuretics/GDMT - avoid hypotension. May benefit from 1x fluid bolus - follow BMP    4. OSA - CPAP at bedtime   5. 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  - CTA coronaries and aortic root (43 mm) - recent echo at [facility], Ao Root Diam 4.3 cm  - follow annually    Robbie Lis, PA-C 11/26/2023, 11:00 AM  Advanced Heart Failure Team Pager [phone] (M-F; 7a - 5p)  Please contact [facility] for night-coverage after hours (4p -7a ) and weekends on [email or web address]  Patient seen with APP, plan extensively discussed.   Subjective: -Reports that he feels very weak   Exam: Blood pressure 105/80, pulse (!) 53, temperature 98.7 F (37.1 C), temperature source Oral, resp. rate 18, height 6\' 2"  (1.88 m), weight 126.6 kg, SpO2 100%.  GENERAL: NAD Lungs-CTA CARDIAC:  JVP: 5 cm          Tachycardic and irregular.  No murmur.   No edema.  ABDOMEN: Soft, non-tender, non-distended.  EXTREMITIES: Warm and well perfused.  NEUROLOGIC: No obvious FND  A/P Mr. Obrecht is a 58 year old male with HFpEF, diverticulitis, hypertension, tobacco use, obstructive sleep apnea and hypertrophic cardiomyopathy that is well-known to the advanced heart failure service.  He was recently admitted in [place] for atrial fibrillation and shortness of breath.  Patient was reportedly given IV diltiazem/digoxin and metoprolol with conversion to normal sinus rhythm.  He was discharged home on Eliquis with echocardiogram there demonstrating EF of 55% and severe LVH.  Today Mr. Burruss called clinic reporting that he felt extremely  short of breath and unwell.  In clinic he was noted to be in atrial fibrillation with RVR and hypotensive to the 80s.  Patient sent urgently to the emergency department.  On my evaluation blood pressure ranging from 75/52 - 85/60.   - On exam patient appears euvolemic to hypovolemic.  Reports he has had several days of nausea and emesis. -Discussed with emergency department staff.  Will start amiodarone infusion preceded by 150 mg bolus.  Plan for cardioversion.  Reports compliance with anticoagulation.  Has not missed any doses. - Will admit for 24-hour monitoring.   Aditya Sabharwal Advanced Heart Failure

## 2023-11-26 NOTE — Progress Notes (Signed)
 This was not seen due to acute decompensation.   He was sent to ED via [facility]  On arrival, SOB, dizzy, and nauseated. EKG - A fib RVR 161 bpm  SBP 70  Zhanna Melin NP-C  10:07 AM

## 2023-11-26 NOTE — Sedation Documentation (Signed)
Cardioverted at 200J

## 2023-11-27 ENCOUNTER — Inpatient Hospital Stay (HOSPITAL_COMMUNITY)

## 2023-11-27 DIAGNOSIS — I5032 Chronic diastolic (congestive) heart failure: Secondary | ICD-10-CM | POA: Diagnosis not present

## 2023-11-27 LAB — ECHOCARDIOGRAM COMPLETE
AR max vel: 3.97 cm2
AV Peak grad: 6 mmHg
Ao pk vel: 1.23 m/s
Area-P 1/2: 2.76 cm2
Calc EF: 47 %
Height: 74 in
S' Lateral: 3.9 cm
Single Plane A2C EF: 33.2 %
Single Plane A4C EF: 50.5 %
Weight: 4465.6 [oz_av]

## 2023-11-27 LAB — BASIC METABOLIC PANEL WITH GFR
Anion gap: 7 (ref 5–15)
BUN: 26 mg/dL — ABNORMAL HIGH (ref 6–20)
CO2: 24 mmol/L (ref 22–32)
Calcium: 9.6 mg/dL (ref 8.9–10.3)
Chloride: 106 mmol/L (ref 98–111)
Creatinine, Ser: 1.65 mg/dL — ABNORMAL HIGH (ref 0.61–1.24)
GFR, Estimated: 48 mL/min — ABNORMAL LOW (ref >60)
Glucose, Bld: 97 mg/dL (ref 70–99)
Potassium: 4.6 mmol/L (ref 3.5–5.1)
Sodium: 137 mmol/L (ref 135–145)

## 2023-11-27 MED ORDER — AMIODARONE HCL 200 MG PO TABS: 400.0000 mg | ORAL_TABLET | Freq: Every day | ORAL | 1 refills | Status: DC

## 2023-11-27 MED ORDER — AMIODARONE HCL 200 MG PO TABS
400.0000 mg | ORAL_TABLET | Freq: Two times a day (BID) | ORAL | Status: DC
  Administered 2023-11-27: 400 mg via ORAL
  Filled 2023-11-27: qty 2

## 2023-11-27 MED ORDER — APIXABAN 5 MG PO TABS
5.0000 mg | ORAL_TABLET | Freq: Two times a day (BID) | ORAL | Status: DC
  Administered 2023-11-27: 5 mg via ORAL
  Filled 2023-11-27: qty 1

## 2023-11-27 MED ORDER — FUROSEMIDE 40 MG PO TABS: ORAL_TABLET | ORAL | Status: DC

## 2023-11-27 MED ORDER — SODIUM CHLORIDE 0.9 % IV SOLN: INTRAVENOUS | Status: DC

## 2023-11-27 MED ORDER — FUROSEMIDE 40 MG PO TABS: 40.0000 mg | ORAL_TABLET | ORAL | Status: DC | PRN

## 2023-11-27 MED ORDER — LACTATED RINGERS IV BOLUS
500.0000 mL | Freq: Once | INTRAVENOUS | Status: AC
  Administered 2023-11-27: 500 mL via INTRAVENOUS

## 2023-11-27 NOTE — Plan of Care (Signed)
  Problem: Education: Goal: Knowledge of General Education information will improve Description: Including pain rating scale, medication(s)/side effects and non-pharmacologic comfort measures Outcome: Adequate for Discharge   Problem: Health Behavior/Discharge Planning: Goal: Ability to manage health-related needs will improve Outcome: Adequate for Discharge   Problem: Clinical Measurements: Goal: Ability to maintain clinical measurements within normal limits will improve Outcome: Adequate for Discharge Goal: Will remain free from infection Outcome: Completed/Met Goal: Diagnostic test results will improve Outcome: Completed/Met Goal: Respiratory complications will improve Outcome: Completed/Met Goal: Cardiovascular complication will be avoided Outcome: Adequate for Discharge   Problem: Activity: Goal: Risk for activity intolerance will decrease Outcome: Adequate for Discharge   Problem: Nutrition: Goal: Adequate nutrition will be maintained Outcome: Completed/Met   Problem: Nutrition: Goal: Adequate nutrition will be maintained Outcome: Completed/Met   Problem: Coping: Goal: Level of anxiety will decrease Outcome: Completed/Met   Problem: Elimination: Goal: Will not experience complications related to bowel motility Outcome: Adequate for Discharge Goal: Will not experience complications related to urinary retention Outcome: Completed/Met   Problem: Pain Managment: Goal: General experience of comfort will improve and/or be controlled Outcome: Completed/Met   Problem: Safety: Goal: Ability to remain free from injury will improve Outcome: Completed/Met   Problem: Skin Integrity: Goal: Risk for impaired skin integrity will decrease Outcome: Adequate for Discharge   Problem: Education: Goal: Knowledge of disease or condition will improve Outcome: Adequate for Discharge Goal: Understanding of medication regimen will improve Outcome: Adequate for Discharge Goal:  Individualized Educational Video(s) Outcome: Not Applicable   Problem: Activity: Goal: Ability to tolerate increased activity will improve Outcome: Completed/Met   Problem: Cardiac: Goal: Ability to achieve and maintain adequate cardiopulmonary perfusion will improve Outcome: Adequate for Discharge   Problem: Health Behavior/Discharge Planning: Goal: Ability to safely manage health-related needs after discharge will improve Outcome: Completed/Met

## 2023-11-27 NOTE — Discharge Instructions (Signed)

## 2023-11-27 NOTE — Progress Notes (Signed)
   11/26/23 2258  BiPAP/CPAP/SIPAP  $ Face Mask Medium Yes  BiPAP/CPAP/SIPAP Pt Type Adult  BiPAP/CPAP/SIPAP DREAMSTATIOND  Mask Type Full face mask  Dentures removed? Not applicable  Mask Size Medium  EPAP 4 cmH2O (Pressure of 4)  Patient Home Machine No  Patient Home Mask No  Patient Home Tubing No  Auto Titrate No  CPAP/SIPAP surface wiped down Yes  Device Plugged into RED Power Outlet Yes  BiPAP/CPAP /SiPAP Vitals  Pulse Rate (!) 57  Resp 18  SpO2 97 %  Bilateral Breath Sounds Clear;Diminished  MEWS Score/Color  MEWS Score 0  MEWS Score Color Chad Lyons

## 2023-11-27 NOTE — Progress Notes (Signed)
 Advanced Heart Failure Rounding Note  Cardiologist: Lance Muss, MD  [facility]: Dr. Gala Romney  Chief Complaint: Atrial Fibrillation  Subjective:    S/p urgent DCCV yesterday>>NSR  Maintaining NSR/SB HR upper 50s on amio gtt at 30/hr  AKI improving, SCr 2.17>>1.65. SBPs still soft, low 100s.   Overall feeling better. Denies dyspnea. No CP.    Objective:   Weight Range: 126.6 kg Body mass index is 35.83 kg/m.   Vital Signs:   Temp:  [97.4 F (36.3 C)-98.6 F (37 C)] 97.4 F (36.3 C) (04/11 0831) Pulse Rate:  [47-155] 47 (04/11 0831) Resp:  [9-21] 20 (04/11 0831) BP: (72-101)/(52-95) 100/69 (04/11 0831) SpO2:  [95 %-100 %] 100 % (04/11 0831) Weight:  [123 kg-126.6 kg] 126.6 kg (04/11 0500) Last BM Date : 11/25/23 (per pt in the afternoon)  Weight change: Filed Weights   11/26/23 1008 11/26/23 1350 11/27/23 0500  Weight: 123 kg 123.8 kg 126.6 kg    Intake/Output:   Intake/Output Summary (Last 24 hours) at 11/27/2023 0900 Last data filed at 11/27/2023 0800 Gross per 24 hour  Intake 2109.65 ml  Output 2 ml  Net 2107.65 ml      Physical Exam    General:  Well appearing. No resp difficulty HEENT: Normal Neck: Supple. JVP not elevated . Carotids 2+ bilat; no bruits. No lymphadenopathy or thyromegaly appreciated. Cor: PMI nondisplaced. Regular rate & rhythm. No rubs, gallops or murmurs. Lungs: Clear Abdomen: Soft, nontender, nondistended. No hepatosplenomegaly. No bruits or masses. Good bowel sounds. Extremities: No cyanosis, clubbing, rash, edema Neuro: Alert & orientedx3, cranial nerves grossly intact. moves all 4 extremities w/o difficulty. Affect pleasant   Telemetry   Sinus brady upper 50s  EKG    N/A  Labs    CBC Recent Labs    11/26/23 1014  WBC 8.7  NEUTROABS 6.5  HGB 17.0  HCT 50.6  MCV 94.2  PLT 252   Basic Metabolic Panel Recent Labs    16/10/96 1014 11/27/23 0531  NA 137 137  K 4.8 4.6  CL 106 106  CO2 17* 24  GLUCOSE  117* 97  BUN 28* 26*  CREATININE 2.17* 1.65*  CALCIUM 9.8 9.6  MG 2.4  --    Liver Function Tests Recent Labs    11/26/23 1014  AST 72*  ALT 38  ALKPHOS 46  BILITOT 1.4*  PROT 7.7  ALBUMIN 3.9   No results for input(s): "LIPASE", "AMYLASE" in the last 72 hours. Cardiac Enzymes No results for input(s): "CKTOTAL", "CKMB", "CKMBINDEX", "TROPONINI" in the last 72 hours.  BNP: BNP (last 3 results) Recent Labs    04/06/23 0901  BNP 277.4*    ProBNP (last 3 results) No results for input(s): "PROBNP" in the last 8760 hours.   D-Dimer No results for input(s): "DDIMER" in the last 72 hours. Hemoglobin A1C No results for input(s): "HGBA1C" in the last 72 hours. Fasting Lipid Panel No results for input(s): "CHOL", "HDL", "LDLCALC", "TRIG", "CHOLHDL", "LDLDIRECT" in the last 72 hours. Thyroid Function Tests No results for input(s): "TSH", "T4TOTAL", "T3FREE", "THYROIDAB" in the last 72 hours.  Invalid input(s): "FREET3"  Other results:   Imaging    DG Chest Portable 1 View Result Date: 11/26/2023 CLINICAL DATA:  afib, dyspnea. EXAM: PORTABLE CHEST 1 VIEW COMPARISON:  09/04/2021. FINDINGS: Low lung volume. Bilateral lung fields are clear. Bilateral costophrenic angles are clear. Normal cardio-mediastinal silhouette. No acute osseous abnormalities. The soft tissues are within normal limits. IMPRESSION: *No active disease. Electronically  Signed   By: Jules Schick M.D.   On: 11/26/2023 11:45     Medications:     Scheduled Medications:  apixaban  5 mg Oral BID   lidocaine  1 patch Transdermal Q24H   sodium chloride flush  3 mL Intravenous Q12H    Infusions:  sodium chloride     amiodarone 30 mg/hr (11/27/23 0800)    PRN Medications: sodium chloride, acetaminophen, ALPRAZolam, sodium chloride flush    Patient Profile   58 y/o male w/ chronic HFmrEF w/ HCM, PAF, OSA on CPAP, HTN, Obesity s/p lap band surgery and aortic root dilation, admitted for  hemodynamically unstable atrial fibrillation w/ RVR, requiring urgent DCCV.    Assessment/Plan   1. Afib w/ RVR - initially presented w/ V-rates 150s, hypotensive 80s systolic - s/p urgent DCCV while in the ED back to NSR (denies missed doses of Eliquis) - maintaining NSR/SB on amio gtt  - transition to PO amiodarone 400 mg bid x 7days>>200 mg bid  - continue Eliquis 5 mg bid  - continue CPAP at bedtime  - this is 2nd occurrence in the last 4 wks, recommend outpatient referral to EP to consider Afib ablation (informed pt, he will discuss w/ his new cardiologist in [place]) - Hold Toprol XL for now w/ bradycardia and soft BP     2. Chronic HFmrEF, HCM  - Echo 2018 EF 50-55% - Echo 09/05/21 EF 40-45% + LVH - cMRI (1/23): LVEF 35%, RVEF 42%, study consistent w HCM.  Septal thickness 2.4 cm  - Echo 04/25/22: EF 40-45% Severe concentric LVH G1DD Asc Ao 4.5 cm No LVOT gradient  - Echo 11/2023 [facility]) EF 55-65%, severe LVH, RV ok  - He was dry on admission, received 2 IVF boluses, AKI improving. BP remains soft - Hold diuretics and HF GDMT for now given AKI and soft BP. Will need early post hospital f/u to try to get back on regimen  - needs to complete genetic testing. Has been referred to Dr. Jomarie Longs but not seen yet. - per above, would benefit from outpatient EP consultation. ? ICD given HCM   3. AKI - baseline SCr ~1.2. SCr elevated at 2.17 on admit - suspect AKI 2/2 hypotension from rapid afib + low volume status - now s/p DCCV and given fluid boluses x 2  - improving, SCr 1.65 today, SBPs remain soft low 100s  - would continue to hold diuretics and BP active meds for now  - follow BMP     4. OSA - CPAP at bedtime    5. 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  - CTA coronaries and aortic root (43 mm) - recent echo at [facility], Ao Root Diam 4.3 cm  - follow annually   Anticipate d/c home later today after MD sees    Length of Stay: 1  Torianne Laflam, PA-C  11/27/2023,  9:00 AM  Advanced Heart Failure Team Pager [phone] (M-F; 7a - 5p)  Please contact [facility] for night-coverage after hours (5p -7a ) and weekends on [email or web address]

## 2023-11-27 NOTE — TOC Initial Note (Signed)
 Transition of Care [facility]) - Initial/Assessment Note    Patient Details  Name: Chad Lyons MRN: [id number] Date of Birth: [birth year]  Transition of Care [facility]) CM/SW Contact:    Nicanor Bake Phone Number: [phone] 11/27/2023, 1:03 PM  Clinical Narrative: HF CSW met with patient and brother at bedside. Patient stated that he lives alone. Patient stated that he has no history of HH services. Patient stated that the only equipment he uses is a CPAP machine. Patient stated that he has a scale at home. Patient stated that he has a PCP, and requested a morning appointment. CSW explained that a hospital follow up appointment will be scheduled closer towards dc. Patient agrees. Patient drove himself here and plans to drive home at dc.   HF CSW called to schedule patients hospital follow up appointment for Friday, December 11, 2023 at 9:30 AM.  [facility] will continue following.                   Expected Discharge Plan: Home/Self Care Barriers to Discharge: Continued Medical Work up   Patient Goals and CMS Choice            Expected Discharge Plan and Services       Living arrangements for the past 2 months: Single Family Home                                      Prior Living Arrangements/Services Living arrangements for the past 2 months: Single Family Home Lives with:: Self Patient language and need for interpreter reviewed:: Yes Do you feel safe going back to the place where you live?: Yes      Need for Family Participation in Patient Care: No (Comment) Care giver support system in place?: Yes (comment)   Criminal Activity/Legal Involvement Pertinent to Current Situation/Hospitalization: No - Comment as needed  Activities of Daily Living   ADL Screening (condition at time of admission) Independently performs ADLs?: Yes (appropriate for developmental age) Is the patient deaf or have difficulty hearing?: No Does the patient have difficulty seeing, even when  wearing glasses/contacts?: No Does the patient have difficulty concentrating, remembering, or making decisions?: No  Permission Sought/Granted                  Emotional Assessment Appearance:: Appears stated age Attitude/Demeanor/Rapport: Engaged   Orientation: : Oriented to Self, Oriented to Place, Oriented to  Time, Oriented to Situation Alcohol / Substance Use: Not Applicable Psych Involvement: No (comment)  Admission diagnosis:  Atrial fibrillation with RVR (HCC) [I48.91] Patient Active Problem List   Diagnosis Date Noted   Atrial fibrillation with RVR (HCC) 11/26/2023   Acute exacerbation of CHF (congestive heart failure) (HCC) 09/04/2021   Hypophosphatemia 02/24/2017   Pulmonary HTN (HCC) 02/23/2017   Mediastinal adenopathy 02/23/2017   Hypokalemia 02/23/2017   Acute diastolic CHF (congestive heart failure) (HCC) 02/23/2017   LVH (left ventricular hypertrophy)    Elevated troponin 02/22/2017   Abnormal LFTs 02/22/2017   Acute CHF (congestive heart failure) (HCC) 02/22/2017   CHF (congestive heart failure) (HCC) 02/21/2017   OSA (obstructive sleep apnea) 11/27/2016   Polyp of ascending colon    Polyp of rectum    Ventral incisional hernia 02/20/2015   Fitting and adjustment of gastric lap band 12/11/2011   ABDOMINAL MASS, RIGHT LOWER QUADRANT 03/17/2007   OBESITY, MORBID 01/22/2007   Essential hypertension 01/22/2007  Hypertrophic obstructive cardiomyopathy (HCC) 01/22/2007   DIVERTICULITIS, HX OF 01/22/2007   PCP:  Shirline Frees, NP Pharmacy:   [id number] - [place], [place] - [address] [place] [address] Phone: [phone] Fax: [phone]  [facility] - [address], [place] - [address] [place] [address] Phone: [phone] Fax: [phone]  [id number] - [place], [facility] - [phone] [facility] [address] [place] [address] Phone:  [phone] Fax: [phone]  [facility] [address] [place] [address] Phone: [phone] Fax: [phone]     Social Drivers of Health (SDOH) Social History: SDOH Screenings   Food Insecurity: No Food Insecurity (11/26/2023)  Housing: Low Risk  (11/26/2023)  Transportation Needs: No Transportation Needs (11/26/2023)  Utilities: Not At Risk (11/26/2023)  Alcohol Screen: Low Risk  (07/01/2023)  Depression (PHQ2-9): Medium Risk (12/11/2022)  Financial Resource Strain: Low Risk  (07/01/2023)  Physical Activity: Insufficiently Active (07/01/2023)  Social Connections: Moderately Integrated (11/26/2023)  Stress: No Stress Concern Present (07/01/2023)  Tobacco Use: Medium Risk (11/26/2023)   SDOH Interventions:     Readmission Risk Interventions     No data to display

## 2023-11-27 NOTE — Discharge Summary (Addendum)
 Advanced Heart Failure Team  Discharge Summary   Patient ID: KAITLIN ARDITO MRN: [id number], DOB/AGE: [birth year] 58 y.o. Admit date: 11/26/2023 D/C date:     11/27/2023   Primary Discharge Diagnoses:  Atrial Fibrillation w/ RVR, s/p DCCV AKI  Secondary Discharge Diagnoses:  Chronic HFpEF, HCM   Hospital Course:   Mr Wempe is a 58 y.o.with history of HFpEF, diverticulitis, HTN, S/P Lap Band, tobacco abuse, and OSA (uses CPAP), and new diagnosis of hypertrophic cardiomyopathy.   Echo in 2018 EF 50-55%.    Admitted 1/23 with a/c CHF. He was diuresed with IV lasix. Echo showed EF 40-45%, +LVH (Dr. Gala Romney read as 50%). cMRI showed LVEF 35%, moderate asymmetric septal hypertrophy, no LVOT, RVEF 42%, findings consistent with hypertrophic cardiomyopathy. He was discharged on GDMT with spiro, Lasix, carvedilol and Entresto; discharge weight 325 lbs.   Hospital follow up up he was in Afib, CVR and volume overloaded. Amio and Eliquis started, DCCV arranged and Lasix increased.   DCCV 09/23/21 to NSR.   Echo 04/25/22: EF 40-45% Severe concentric LVH G1DD Asc Ao 4.5 cm    Zio 2 week (10/23): mostly NSR, 10 runs of NSVT, 5.4% PVC burden   CPX (10/23): mild to moderate function limitation, primarily due to body habitus. Slope 27   Coronary CTA (11/23): Coronary calcium score 1 (59th%), non obs CAD, severe asymmetric septal hypertrophy, mild AoV calcifications (AV calcium score 449).   Pt recently was admitted to [facility] in [facility] last week. Recently relocated there for work. Per notes in Care Everywhere, he presented to the ED w/ CC of chest pain and was found to be in Afib w/ RVR. Was given IV diltiazem and digoxin and oral metoprolol. Fortunately converted to normal sinus rhythm. He is anticoagulated with Eliquis. Echo there showed normal LVEF 55-65%, severe LVH w/ severe interventricular septal wall hypertrophy and moderate posterior  wall hypertrophy noted. RV and IVC size normal. Troponin T was  slightly elevated w/ low level trend, 43>>44>>83>>81. TSH was low at 0.21 but free T4 was WNL 1.57. He was discharged on 4/1.    Several days post d/c, he felt he was back out of rhythm. Symptomatic w/ palpitations. Traveled back to [place] and had scheduled f/u in the [facility] for today. On arrival, he was noted to be in Afib w/ RVR in the 150s and hypotensive, though mentation ok. No respiratory difficulty. He was sent to the ED by rapid response for further management.    While in the ED, he continued w/ V-rates in the 150s and SBPs in the 80s. Labs showed normal K and Mg but elevated SCr c/w AKI (Scr 2.1). He was not volume overloaded. He denied any misses doses of Eliquis in the last 30 days. He was placed on an amiodarone gtt w/ bolus, sedated and underwent DCCV back to NSR. Also given multiple fluid bolues to help w/ AKI and soft BP. Echo showed HCM and small underfilled IVC.   He was admitted for overnight observation and continued on amio gtt. Maintained NSR and transitioned to PO amiodarone the next. BP remained soft but AKI showed signs of improvement. He was kept off of all BP active meds. Marcelline Deist held for low volume status. Instructed to take Lasix only PRN    F/u arranged in the [facility] next wk.     Discharge Weight Range: 279 lb Discharge Vitals: Blood pressure 105/80, pulse (!) 53, temperature 98.7 F (37.1 C), temperature source Oral, resp. rate 18, height  6\' 2"  (1.88 m), weight 126.6 kg, SpO2 100%.  Labs: Lab Results  Component Value Date   WBC 8.7 11/26/2023   HGB 17.0 11/26/2023   HCT 50.6 11/26/2023   MCV 94.2 11/26/2023   PLT 252 11/26/2023    Recent Labs  Lab 11/26/23 1014 11/27/23 0531  NA 137 137  K 4.8 4.6  CL 106 106  CO2 17* 24  BUN 28* 26*  CREATININE 2.17* 1.65*  CALCIUM 9.8 9.6  PROT 7.7  --   BILITOT 1.4*  --   ALKPHOS 46  --   ALT 38  --   AST 72*  --   GLUCOSE 117* 97   Lab Results  Component Value Date   CHOL 144 12/18/2022   HDL 43.80 12/18/2022    LDLCALC 81 12/18/2022   TRIG 94.0 12/18/2022   BNP (last 3 results) Recent Labs    04/06/23 0901  BNP 277.4*    ProBNP (last 3 results) No results for input(s): "PROBNP" in the last 8760 hours.   Diagnostic Studies/Procedures   DG Chest Portable 1 View Result Date: 11/26/2023 CLINICAL DATA:  afib, dyspnea. EXAM: PORTABLE CHEST 1 VIEW COMPARISON:  09/04/2021. FINDINGS: Low lung volume. Bilateral lung fields are clear. Bilateral costophrenic angles are clear. Normal cardio-mediastinal silhouette. No acute osseous abnormalities. The soft tissues are within normal limits. IMPRESSION: *No active disease. Electronically Signed   By: Jules Schick M.D.   On: 11/26/2023 11:45    Discharge Medications   Allergies as of 11/27/2023       Reactions   Hydromet [hydrocodone Bit-homatrop Mbr] Itching   Omnipaque [iohexol] Hives, Itching   Itching and hives develop several hours after injection   Iodine-131 Itching   Penicillins Itching   Red Dye #40 (allura Red) Itching        Medication List     STOP taking these medications    dapagliflozin propanediol 10 MG Tabs tablet Commonly known as: Farxiga   Entresto 97-103 MG Generic drug: sacubitril-valsartan   metoprolol succinate 25 MG 24 hr tablet Commonly known as: TOPROL-XL   potassium chloride SA 20 MEQ tablet Commonly known as: Klor-Con M20   spironolactone 25 MG tablet Commonly known as: ALDACTONE       TAKE these medications    ALPRAZolam 0.5 MG tablet Commonly known as: XANAX Take 0.5 mg by mouth daily as needed for anxiety.   amiodarone 200 MG tablet Commonly known as: PACERONE Take 2 tablets (400 mg total) by mouth daily.   apixaban 5 MG Tabs tablet Commonly known as: Eliquis Take 1 tablet (5 mg total) by mouth 2 (two) times daily.   B COMPLEX PO Take 1 each by mouth daily. OTC B-Complex gummy supplement.   furosemide 40 MG tablet Commonly known as: LASIX Take 1 tablet (40 mg total) by mouth as  needed. What changed:  how much to take how to take this when to take this reasons to take this additional instructions   meloxicam 15 MG tablet Commonly known as: MOBIC Take 15 mg by mouth daily as needed for pain.   Mens Multivitamin Gummies Chew Chew 1 each by mouth daily.   OVER THE COUNTER MEDICATION Take 1 each by mouth daily. OTC Keto + ACV gummy supplement   triamcinolone cream 0.1 % Commonly known as: KENALOG APPLY TOPICALLY 2 TIMES DAILY AS NEEDED (RASH).   VITAMIN D-3 PO Take 1 each by mouth daily. OTC Vitamin D3 gummy supplement  Disposition   The patient will be discharged in stable condition to home. Discharge Instructions     Discharge patient   Complete by: As directed    Discharge disposition: 01-Home or Self Care   Discharge patient date: 11/27/2023       Follow-up Information     Shirline Frees, NP. Go in 14 day(s).   Specialty: Family Medicine Why: Hospital follow up appointment scheduled for Friday, December 11, 2023 at 9:30 AM. PLEASE ARRIVE 10-15 minutes early.  PLEASE BRING insurance card, copay, and ID.  PLEASE call to cancel/reschedule appointment if you CANNOT make it. Contact information: [address] [place] [address] [phone]         Dorthula Nettles, DO Follow up.   Specialty: Cardiology Why: Hospital F/u in the Advanced Heart Failure Clinic at [facility]  4/18 at 11:40 AM Contact information: [address] [place] [address] [phone]                   Duration of Discharge Encounter: Greater than 35 minutes   Signed, Knute Neu  11/27/2023, 5:21 PM  Patient seen with APP, plan extensively discussed.   Subjective: - feels much better   Exam: Blood pressure 105/80, pulse (!) 53, temperature 98.7 F (37.1 C), temperature source Oral, resp. rate 18, height 6\' 2"  (1.88 m), weight 126.6 kg, SpO2 100%.  GENERAL: NAD Lungs-  cta CARDIAC:  JVP: 6 cm          Normal rate with regular rhythm. no murmur.  no edema.  ABDOMEN: Soft, non-tender, non-distended.  EXTREMITIES: Warm and well perfused.  NEUROLOGIC: No obvious FND  A/P - Euvolemic on exam now; extensive discussion with patient. Telemetry now normal sinus rhythm.  - Reviewed echocardiogram today; IVC is 1.9cm and completely collapsible with inspiration. 500cc IVF prior to discharge.   Raylee Strehl Advanced Heart Failure  Time spent on discharge: 40 minutes

## 2023-11-27 NOTE — Progress Notes (Signed)
  Echocardiogram 2D Echocardiogram has been performed.  Chad Lyons 11/27/2023, 5:18 PM

## 2023-11-30 ENCOUNTER — Telehealth: Payer: Self-pay

## 2023-11-30 NOTE — Transitions of Care (Post Inpatient/ED Visit) (Signed)
   11/30/2023  Name: Chad Lyons MRN: [id number] DOB: 01-09-1966  Today's TOC FU Call Status: Today's TOC FU Call Status:: Unsuccessful Call (1st Attempt) Unsuccessful Call (1st Attempt) Date: 11/30/23  Attempted to reach the patient regarding the most recent Inpatient/ED visit.  Follow Up Plan: Additional outreach attempts will be made to reach the patient to complete the Transitions of Care (Post Inpatient/ED visit) call.   Orpha Blade, RN, BSN, CEN [employer]- Transition of Care Team.  [facility] [phone]

## 2023-12-01 ENCOUNTER — Telehealth: Payer: Self-pay

## 2023-12-01 NOTE — Transitions of Care (Post Inpatient/ED Visit) (Signed)
   12/01/2023  Name: Chad Lyons MRN: [id number] DOB: 15-Mar-1966  Today's TOC FU Call Status: Today's TOC FU Call Status:: Unsuccessful Call (2nd Attempt) Unsuccessful Call (2nd Attempt) Date: 12/01/23  Attempted to reach the patient regarding the most recent Inpatient/ED visit.  Follow Up Plan: Additional outreach attempts will be made to reach the patient to complete the Transitions of Care (Post Inpatient/ED visit) call.   Orpha Blade, RN, BSN, CEN [employer]- Transition of Care Team.  [facility] [phone]

## 2023-12-02 ENCOUNTER — Telehealth: Payer: Self-pay

## 2023-12-02 NOTE — Transitions of Care (Post Inpatient/ED Visit) (Signed)
   12/02/2023  Name: Chad Lyons MRN: [id number] DOB: Jan 21, 1966  Today's TOC FU Call Status:    Attempted to reach the patient regarding the most recent Inpatient/ED visit.  Follow Up Plan: No further outreach attempts will be made at this time. We have been unable to contact the patient.  Orpha Blade, RN, BSN, CEN [employer]- Transition of Care Team.  [facility] [phone]

## 2023-12-03 NOTE — Progress Notes (Signed)
 ADVANCED HF CLINIC NOTE   Primary Care: [place], NP HF Cardiologist: Dr. Julane Ny  HPI: Chad Lyons is a 58 y.o.with history of HFpEF, diverticulitis, HTN, S/P Lap Band, tobacco abuse, and OSA (uses CPAP), and recently diagnosed hypertrophic cardiomyopath presenting today for post-hospital follow up.   Echo in 2018 EF 50-55%.    Admitted 1/23 with a/c CHF. He was diuresed with IV lasix . Echo showed EF 40-45%, +LVH (Dr. Julane Ny read as 50%). cMRI showed LVEF 35%, moderate asymmetric septal hypertrophy, no LVOT, RVEF 42%, findings consistent with hypertrophic cardiomyopathy. He was discharged on GDMT with spiro, Lasix , carvedilol  and Entresto ; discharge weight 325 lbs.  Last week Chad Lyons was admitted to [facility] directly from clinic after presenting with symptomatic atrial fibrillation with RVR. He was hypotensive to the 70s-80s systolic. Patient was emergently cardioverted in the emergency department and loaded with amiodarone . One week prior to admission here, he was also admitted at [place] in [facility] for similar presentation.   Today he reports feeling much better. He has no PND, DOE, palpitations, chest pain or lightheadedness. He is active and continues to work from home without limitations.   FH: mother has Afib, brother has MI and AF  Cardiac Studies:  - CPX (11/23):  Pre-Exercise PFTs  FVC 3.17 (68%)      FEV1 2.42 (67%)        FEV1/FVC 76 (98%)        MVV 119 (75%)   Peak VO2: 16.4 (74% predicted peak VO2)  VE/VCO2 slope:  27  OUES: 2.79  Peak RER: 1.15   - Echo (9/23): EF 40-45%, severe concentric LVH, G1DD, Asc Ao 4.5 cm   - Echo (1/23): EF 40-45%, + LVH  - cMRI (1/23): LVEF 35%, RVEF 42%, study consistent w HCM.  Echo 04/25/22: EF 40-45% Severe concentric LVH G1DD Asc Ao 4.5 cm   Zio 2 week (10/23): mostly NSR, 10 runs of NSVT, 5.4% PVC burden  CPX (10/23): mild to moderate function limitation, primarily due to body habitus. Slope 27  Coronary CTA  (11/23): Coronary calcium score 1 (59th%), non obs CAD, severe asymmetric septal hypertrophy, mild AoV calcifications (AV calcium score 449)  Past Medical History:  Diagnosis Date   Diverticulitis    HX OF   EKG, abnormal    HX OF LEFT ANTERIO FASCICULAR BLOCK ON 02-15-15 EKG   GERD (gastroesophageal reflux disease)    Heart murmur    NOW RESOLVED   Hypertension    OSA (obstructive sleep apnea) 11/27/2016   Ventral incisional hernia    Current Outpatient Medications  Medication Sig Dispense Refill   ALPRAZolam  (XANAX ) 0.5 MG tablet Take 0.5 mg by mouth daily as needed for anxiety.     amiodarone  (PACERONE ) 200 MG tablet Take 2 tablets (400 mg total) by mouth daily. 60 tablet 1   apixaban  (ELIQUIS ) 5 MG TABS tablet Take 1 tablet (5 mg total) by mouth 2 (two) times daily. 180 tablet 3   B Complex Vitamins (B COMPLEX PO) Take 1 each by mouth daily. OTC B-Complex gummy supplement.     Cholecalciferol (VITAMIN D -3 PO) Take 1 each by mouth daily. OTC Vitamin D3 gummy supplement     furosemide  (LASIX ) 40 MG tablet Take 1 tablet (40 mg total) by mouth as needed.     meloxicam (MOBIC) 15 MG tablet Take 15 mg by mouth daily as needed for pain.     Multiple Vitamins-Minerals (MENS MULTIVITAMIN GUMMIES) CHEW Chew 1 each by mouth  daily.     OVER THE COUNTER MEDICATION Take 1 each by mouth daily. OTC Keto + ACV gummy supplement     triamcinolone  cream (KENALOG ) 0.1 % APPLY TOPICALLY 2 TIMES DAILY AS NEEDED (RASH). 30 g 0   No current facility-administered medications for this encounter.   Allergies  Allergen Reactions   Hydromet [Hydrocodone  Bit-Homatrop Mbr] Itching   Omnipaque  [Iohexol ] Hives and Itching    Itching and hives develop several hours after injection   Iodine-131 Itching   Penicillins Itching   Red Dye #40 (Allura Red) Itching   Social History   Socioeconomic History   Marital status: Married    Spouse name: Bryker Fletchall   Number of children: 1   Years of education: Not on  file   Highest education level: Master's degree (e.g., MA, MS, MEng, MEd, MSW, MBA)  Occupational History   Occupation: Sales  Tobacco Use   Smoking status: Former    Current packs/day: 0.50    Types: Cigarettes   Smokeless tobacco: Never   Tobacco comments:    Current Cigars 1 every other day -----QUIT cigarettes in 2006>> 1/2PPD x 15 years  Vaping Use   Vaping status: Never Used  Substance and Sexual Activity   Alcohol use: Yes    Alcohol/week: 40.0 standard drinks of alcohol    Types: 40 Shots of liquor per week    Comment: 6-7 shots of liquor daily x 8 years. heavier on weekend   Drug use: No   Sexual activity: Not on file    Comment: still smokes cigars  Other Topics Concern   Not on file  Social History Narrative   Not on file   Social Drivers of Health   Financial Resource Strain: Low Risk  (07/01/2023)   Overall Financial Resource Strain (CARDIA)    Difficulty of Paying Living Expenses: Not hard at all  Food Insecurity: No Food Insecurity (11/26/2023)   Hunger Vital Sign    Worried About Running Out of Food in the Last Year: Never true    Ran Out of Food in the Last Year: Never true  Transportation Needs: No Transportation Needs (11/26/2023)   PRAPARE - Administrator, Civil Service (Medical): No    Lack of Transportation (Non-Medical): No  Physical Activity: Insufficiently Active (07/01/2023)   Exercise Vital Sign    Days of Exercise per Week: 3 days    Minutes of Exercise per Session: 10 min  Stress: No Stress Concern Present (07/01/2023)   [employer] of Occupational Health - Occupational Stress Questionnaire    Feeling of Stress : Only a little  Social Connections: Moderately Integrated (11/26/2023)   Social Connection and Isolation Panel [NHANES]    Frequency of Communication with Friends and Family: More than three times a week    Frequency of Social Gatherings with Friends and Family: Twice a week    Attends Religious Services: More than  4 times per year    Active Member of [employer] or Organizations: Yes    Attends Banker Meetings: More than 4 times per year    Marital Status: Separated  Intimate Partner Violence: Not At Risk (11/26/2023)   Humiliation, Afraid, Rape, and Kick questionnaire    Fear of Current or Ex-Partner: No    Emotionally Abused: No    Physically Abused: No    Sexually Abused: No   Family History  Problem Relation Age of Onset   Heart disease Mother    High blood pressure  Mother    Diabetes Mother    Hypertension Brother    Heart failure Brother    Stroke Brother    Heart disease Maternal Aunt    Cancer Maternal Uncle        pancreatic   Heart disease Maternal Uncle    Rheum arthritis Maternal Grandmother    High blood pressure Maternal Grandmother    Colon cancer Neg Hx    BP 132/86   Pulse (!) 50   Wt 126 kg (277 lb 12.8 oz)   SpO2 100%   BMI 35.67 kg/m   Wt Readings from Last 3 Encounters:  12/04/23 126 kg (277 lb 12.8 oz)  11/27/23 126.6 kg (279 lb 1.6 oz)  11/26/23 123.5 kg (272 lb 3.2 oz)   PHYSICAL EXAM: Vitals:   12/04/23 1144  BP: 132/86  Pulse: (!) 50  SpO2: 100%   GENERAL: NAD Lungs- CTA CARDIAC:  JVP: 6 cm          Normal rate with regular rhythm. No murmur.  Pulses 2+. No edema.  ABDOMEN: Soft, non-tender, non-distended.  EXTREMITIES: Warm and well perfused.  NEUROLOGIC: No obvious FND   ECG (personally reviewed): NSR 63 bpm, IVCD, + LVH w/ PVC   ASSESSMENT & PLAN: 1. Chronic HFmrEF, with HCM - Echo 2018 EF 50-55% - Echo 09/05/21 EF 40-45% + LVH - cMRI (1/23): LVEF 35%, RVEF 42%, study consistent w HCM.  Septal thickness 2.4 cm  - Echo 04/25/22: EF 40-45% Severe concentric LVH G1DD Asc Ao 4.5 cm No LVOT gradient  - Echo 11/27/23: EF 50-55%, low normal RV function. Severe LVH. Grade I DD.  - CPX (11/23):  VE/VCO2 slope:  27, OUES: 2.79, Peak RER: 1.15; mild functional impairment, primarily limited by body habitus, pre-exercise spiro appears  restrictive lung physiology  - Will re-refer to Dr. Drexel Gentles for genetic testing. First degree relatives will need genetic testing.He has still not heard back from genetic clinic. Will obtain genetic testing today.  - Improved NYHA I-II, euvolemic on exam. Repeat BMP/BNP today.  - Continue Farxiga  10 mg daily. - Euvolemic on exam; lasix  40mg  every other day.  - No high risk features for SCD identified at this point . CPX reassuring regarding risk for SCD - will obtain genetic testing today; reports he has not had this done yet. Refer to HCM clinic.  - Repeat BMP/BNP today.  - Unable to tolerate GDMT currently. Will follow up in the near future and try to add back on.   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 - CHADs-VASC: 2 - s/p DCCV 09/23/21--> NSR. - Continue amiodarone  200mg  daily - Continue Eliquis  5 mg bid. No bleeding issues - Continue CPAP.  - Two episodes of afib with RVR over the past 1 month requiring hospitalization. Will continue amiodarone  for the time being. Refer to EP to evaluate for afib ablation. Discussed with patient today.   3. Aortic root dilation - 4.5 cm on 9/23 echo.  - CTA coronaries and aortic root (43 mm) - Follow with yearly echo.    4. OSA - Continue CPAP nightly. - Continue with weight loss.    5.  HTN - Resolved; currently unable to tolerate much GDMT. Will hold off on aggressive afterload reduction.   6. Obesity  - s/p lap band in 2012. - Body mass index is 35.67 kg/m. - Weight down almost 20 lbs since last visit. Congratulated   7. Tobacco abuse/ETOH Abuse - Quit smoking 2/23  - Discussed cutting back  on ETOH, ideally quitting.  Follow up in 1-17m with Dr. Julane Ny. Refer to HCM clinic. Genetic testing today. Repeat labs today. Refer to EP for AF ablation.   I spent 40 minutes caring for this patient today including face to face time, ordering and reviewing labs, reviewing records from hospitalization in 4/25, seeing the patient, discussing AF ablation,  HCM clinic, discussing genetic testing,  documenting in the record, and arranging follow ups.   Talia Hoheisel, DO  12:15 PM

## 2023-12-04 ENCOUNTER — Ambulatory Visit (HOSPITAL_COMMUNITY)
Admit: 2023-12-04 | Discharge: 2023-12-04 | Disposition: A | Discharge status: Home / Self Care | Source: Ambulatory Visit | Attending: Cardiology | Admitting: Cardiology

## 2023-12-04 VITALS — BP 132/86 | HR 50 | Wt 277.8 lb

## 2023-12-04 DIAGNOSIS — Z7901 Long term (current) use of anticoagulants: Secondary | ICD-10-CM | POA: Insufficient documentation

## 2023-12-04 DIAGNOSIS — Z7984 Long term (current) use of oral hypoglycemic drugs: Secondary | ICD-10-CM | POA: Diagnosis not present

## 2023-12-04 DIAGNOSIS — I5032 Chronic diastolic (congestive) heart failure: Secondary | ICD-10-CM | POA: Diagnosis present

## 2023-12-04 DIAGNOSIS — Z9884 Bariatric surgery status: Secondary | ICD-10-CM | POA: Insufficient documentation

## 2023-12-04 DIAGNOSIS — I110 Hypertensive heart disease with heart failure: Secondary | ICD-10-CM | POA: Diagnosis not present

## 2023-12-04 DIAGNOSIS — G4733 Obstructive sleep apnea (adult) (pediatric): Secondary | ICD-10-CM | POA: Diagnosis not present

## 2023-12-04 DIAGNOSIS — I4891 Unspecified atrial fibrillation: Secondary | ICD-10-CM | POA: Diagnosis not present

## 2023-12-04 DIAGNOSIS — Z6835 Body mass index (BMI) 35.0-35.9, adult: Secondary | ICD-10-CM | POA: Insufficient documentation

## 2023-12-04 DIAGNOSIS — I480 Paroxysmal atrial fibrillation: Secondary | ICD-10-CM | POA: Diagnosis not present

## 2023-12-04 DIAGNOSIS — Z87891 Personal history of nicotine dependence: Secondary | ICD-10-CM | POA: Diagnosis not present

## 2023-12-04 DIAGNOSIS — F1010 Alcohol abuse, uncomplicated: Secondary | ICD-10-CM | POA: Diagnosis not present

## 2023-12-04 DIAGNOSIS — I421 Obstructive hypertrophic cardiomyopathy: Secondary | ICD-10-CM

## 2023-12-04 DIAGNOSIS — Z79899 Other long term (current) drug therapy: Secondary | ICD-10-CM | POA: Insufficient documentation

## 2023-12-04 DIAGNOSIS — E669 Obesity, unspecified: Secondary | ICD-10-CM | POA: Insufficient documentation

## 2023-12-04 LAB — BASIC METABOLIC PANEL WITH GFR
Anion gap: 9 (ref 5–15)
BUN: 14 mg/dL (ref 6–20)
CO2: 27 mmol/L (ref 22–32)
Calcium: 9.5 mg/dL (ref 8.9–10.3)
Chloride: 105 mmol/L (ref 98–111)
Creatinine, Ser: 1.28 mg/dL — ABNORMAL HIGH (ref 0.61–1.24)
GFR, Estimated: >60 mL/min (ref >60)
Glucose, Bld: 84 mg/dL (ref 70–99)
Potassium: 4.6 mmol/L (ref 3.5–5.1)
Sodium: 141 mmol/L (ref 135–145)

## 2023-12-04 LAB — BRAIN NATRIURETIC PEPTIDE: B Natriuretic Peptide: 183.7 pg/mL — ABNORMAL HIGH (ref 0.0–100.0)

## 2023-12-04 NOTE — Patient Instructions (Signed)
 Medication Changes:  None, continue current medications  Lab Work:  Labs done today, your results will be available in MyChart, we will contact you for abnormal readings.  Genetic testing has been collected, this has to be sent to [place]  for processing and can take 1-2 weeks for us  to get results back.  We will let you know the results once reviewed by your provider.  Referrals:  You have been referred to EP to discuss possible afib ablation, they will call you to schedule  You have been referred to Dr Paulita Boss for further evaluation and treatment of your hypertrophic cardiomyopathy  Special Instructions // Education:  Do the following things EVERYDAY: Weigh yourself in the morning before breakfast. Write it down and keep it in a log. Take your medicines as prescribed Eat low salt foods--Limit salt (sodium) to 2000 mg per day.  Stay as active as you can everyday Limit all fluids for the day to less than 2 liters   Follow-Up in: 2 months with Dr Julane Ny   At the [facility], you and your health needs are our priority. We have a designated team specialized in the treatment of Heart Failure. This Care Team includes your primary Heart Failure Specialized Cardiologist (physician), Advanced Practice Providers (APPs- Physician Assistants and Nurse Practitioners), and Pharmacist who all work together to provide you with the care you need, when you need it.   You may see any of the following providers on your designated Care Team at your next follow up:  Dr. Jules Oar Dr. Peder Bourdon Dr. Alwin Baars Dr. Judyth Nunnery Nieves Bars, NP Ruddy Corral, [place] [facility] [place], [place] Dennise Fitz, NP [place] Lee, NP Luster Salters, PharmD   Please be sure to bring in all your medications bottles to every appointment.   Need to Contact Us :  If you have any questions or concerns before your next appointment please send us  a message through  [place] or call our office at [phone].    TO LEAVE A MESSAGE FOR THE NURSE SELECT OPTION 2, PLEASE LEAVE A MESSAGE INCLUDING: YOUR NAME DATE OF BIRTH CALL BACK NUMBER REASON FOR CALL**this is important as we prioritize the call backs  YOU WILL RECEIVE A CALL BACK THE SAME DAY AS LONG AS YOU CALL BEFORE 4:00 PM

## 2023-12-10 ENCOUNTER — Encounter (HOSPITAL_COMMUNITY)

## 2023-12-11 ENCOUNTER — Inpatient Hospital Stay: Admitting: Adult Health

## 2023-12-20 ENCOUNTER — Other Ambulatory Visit: Payer: Self-pay | Admitting: Cardiology

## 2023-12-22 ENCOUNTER — Encounter: Admitting: Adult Health

## 2024-01-05 NOTE — Telephone Encounter (Signed)
 Copied from CRM [id number]. Topic: General - Other >> Jan 05, 2024  4:08 PM Howard Macho wrote: Reason for CRM: patient returning a call to [facility] at the office

## 2024-01-08 ENCOUNTER — Ambulatory Visit (INDEPENDENT_AMBULATORY_CARE_PROVIDER_SITE_OTHER): Admitting: Adult Health

## 2024-01-08 ENCOUNTER — Encounter: Admitting: Adult Health

## 2024-01-08 VITALS — BP 132/80 | HR 70 | Temp 98.5°F | Ht 73.0 in | Wt 287.0 lb

## 2024-01-08 DIAGNOSIS — I10 Essential (primary) hypertension: Secondary | ICD-10-CM

## 2024-01-08 DIAGNOSIS — Z0000 Encounter for general adult medical examination without abnormal findings: Secondary | ICD-10-CM

## 2024-01-08 DIAGNOSIS — G4733 Obstructive sleep apnea (adult) (pediatric): Secondary | ICD-10-CM | POA: Diagnosis not present

## 2024-01-08 DIAGNOSIS — J0100 Acute maxillary sinusitis, unspecified: Secondary | ICD-10-CM | POA: Diagnosis not present

## 2024-01-08 DIAGNOSIS — G4700 Insomnia, unspecified: Secondary | ICD-10-CM | POA: Diagnosis not present

## 2024-01-08 DIAGNOSIS — I421 Obstructive hypertrophic cardiomyopathy: Secondary | ICD-10-CM

## 2024-01-08 DIAGNOSIS — I5032 Chronic diastolic (congestive) heart failure: Secondary | ICD-10-CM

## 2024-01-08 DIAGNOSIS — E66812 Obesity, class 2: Secondary | ICD-10-CM

## 2024-01-08 DIAGNOSIS — Z23 Encounter for immunization: Secondary | ICD-10-CM | POA: Diagnosis not present

## 2024-01-08 DIAGNOSIS — I4891 Unspecified atrial fibrillation: Secondary | ICD-10-CM

## 2024-01-08 DIAGNOSIS — Z125 Encounter for screening for malignant neoplasm of prostate: Secondary | ICD-10-CM

## 2024-01-08 MED ORDER — BENZONATATE 200 MG PO CAPS: 200.0000 mg | ORAL_CAPSULE | Freq: Three times a day (TID) | ORAL | 0 refills | Status: AC | PRN

## 2024-01-08 MED ORDER — DOXYCYCLINE HYCLATE 100 MG PO CAPS: 100.0000 mg | ORAL_CAPSULE | Freq: Two times a day (BID) | ORAL | 0 refills | Status: DC

## 2024-01-08 NOTE — Progress Notes (Signed)
 Subjective:    Patient ID: DADE RODIN, male    DOB: December 01, 1965, 58 y.o.   MRN: [id number]  HPI Patient presents for yearly preventative medicine examination. He is a 58 year old male who  has a past medical history of Diverticulitis, EKG, abnormal, GERD (gastroesophageal reflux disease), Heart murmur, Hypertension, OSA (obstructive sleep apnea) (11/27/2016), and Ventral incisional hernia.  Essential Hypertension - -managed with Lasix  40 mg daily.  He denies dizziness, lightheadedness, chest pain, or shortness of breath BP Readings from Last 3 Encounters:  01/08/24 (!) 160/100  12/04/23 132/86  11/27/23 105/80   Insomnia - uses Xanax  0.5 mg PRN   OSA -CPAP therapy.  Uses his CPAP every night.  Does report getting good sleep, no daytime somnolence.  Is followed by pulmonary on a yearly basis  Chronic Diastolic CHF-managed with Lasix  40 mg daily.   He denies shortness of breath with exertion, chest pain, lower extremity edema, or palpitations.  Hypertrophic Cardiomyopathy -managed with lasix  40 mg daily.    Obesity- He has been working on healthy eating and staying active and has been able to lose weight   Wt Readings from Last 10 Encounters:  01/08/24 287 lb (130.2 kg)  12/04/23 277 lb 12.8 oz (126 kg)  11/27/23 279 lb 1.6 oz (126.6 kg)  11/26/23 272 lb 3.2 oz (123.5 kg)  07/01/23 (!) 302 lb (137 kg)  04/06/23 298 lb (135.2 kg)  12/18/22 (!) 305 lb (138.3 kg)  12/11/22 (!) 309 lb 6.4 oz (140.3 kg)  04/25/22 (!) 320 lb (145.2 kg)  11/13/21 (!) 317 lb (143.8 kg)   Afib- diagnosed in 08/2021.  He was placed on amiodarone  and Lasix .  Had DCCV in February 2023 to normal sinus rhythm.  In March 2025 he was admitted to [facility] in [place]  complaining of chest pain and found to be in A-fib with RVR.  He was given IV diltiazem and digoxin as well as oral metoprolol.  He did convert to normal sinus rhythm.  He continues to take Eliquis  for anticoagulation.  Echo there showed a  normal LVEF of 55 to 65%, severe LVH with severe intraventricular septal wall hypertrophy and moderate posterior wall hypertrophy noted.  He was discharged on 11/17/2023.  Several days postdischarge he felt back out of rhythm and was symptomatic with palpitations.  He traveled back to [place]  had a scheduled follow-up with the advanced heart failure clinic.  In clinic he was noted to be in A-fib with RVR in 150s and hypotensive.  He was placed on amiodarone  drip with a bolus and sedated and underwent DCCV back to normal sinus rhythm.  He was admitted overnight and maintaining sinus rhythm transition to p.o. amiodarone  the next day.  All immunizations and health maintenance protocols were reviewed with the patient and needed orders were placed.  Appropriate screening laboratory values were ordered for the patient including screening of hyperlipidemia, renal function and hepatic function. If indicated by BPH, a PSA was ordered.  Medication reconciliation,  past medical history, social history, problem list and allergies were reviewed in detail with the patient  Goals were established with regard to weight loss, exercise, and  diet in compliance with medications  He is up to date on routine colon cancer screening   Acutely he reports that for the last 7-10 days he has had a productive cough with discolored sputum, sinus pressure, nasal congestion and rhinorrhea. He has been using Mucinex  without relief.    Review of  Systems  Constitutional: Negative.   HENT:  Positive for congestion, rhinorrhea, sinus pressure and sinus pain.   Eyes: Negative.   Respiratory:  Positive for cough.   Cardiovascular:  Positive for leg swelling.  Gastrointestinal: Negative.   Endocrine: Negative.   Genitourinary: Negative.   Musculoskeletal: Negative.   Skin: Negative.   Allergic/Immunologic: Negative.   Neurological: Negative.   Hematological: Negative.   Psychiatric/Behavioral: Negative.    All other  systems reviewed and are negative.  Past Medical History:  Diagnosis Date   Diverticulitis    HX OF   EKG, abnormal    HX OF LEFT ANTERIO FASCICULAR BLOCK ON 02-15-15 EKG   GERD (gastroesophageal reflux disease)    Heart murmur    NOW RESOLVED   Hypertension    OSA (obstructive sleep apnea) 11/27/2016   Ventral incisional hernia     Social History   Socioeconomic History   Marital status: Married    Spouse name: Jaryan Chicoine   Number of children: 1   Years of education: Not on file   Highest education level: Master's degree (e.g., MA, MS, MEng, MEd, MSW, MBA)  Occupational History   Occupation: Sales  Tobacco Use   Smoking status: Former    Current packs/day: 0.50    Types: Cigarettes   Smokeless tobacco: Never   Tobacco comments:    Current Cigars 1 every other day -----QUIT cigarettes in 2006>> 1/2PPD x 15 years  Vaping Use   Vaping status: Never Used  Substance and Sexual Activity   Alcohol use: Yes    Alcohol/week: 40.0 standard drinks of alcohol    Types: 40 Shots of liquor per week    Comment: 6-7 shots of liquor daily x 8 years. heavier on weekend   Drug use: No   Sexual activity: Not on file    Comment: still smokes cigars  Other Topics Concern   Not on file  Social History Narrative   Not on file   Social Drivers of Health   Financial Resource Strain: Low Risk  (01/07/2024)   Overall Financial Resource Strain (CARDIA)    Difficulty of Paying Living Expenses: Not hard at all  Food Insecurity: No Food Insecurity (01/07/2024)   Hunger Vital Sign    Worried About Running Out of Food in the Last Year: Never true    Ran Out of Food in the Last Year: Never true  Transportation Needs: No Transportation Needs (01/07/2024)   PRAPARE - Administrator, Civil Service (Medical): No    Lack of Transportation (Non-Medical): No  Physical Activity: Insufficiently Active (01/07/2024)   Exercise Vital Sign    Days of Exercise per Week: 5 days    Minutes of  Exercise per Session: 10 min  Stress: No Stress Concern Present (01/07/2024)   [employer] of Occupational Health - Occupational Stress Questionnaire    Feeling of Stress : Only a little  Social Connections: Moderately Integrated (01/07/2024)   Social Connection and Isolation Panel [NHANES]    Frequency of Communication with Friends and Family: More than three times a week    Frequency of Social Gatherings with Friends and Family: More than three times a week    Attends Religious Services: More than 4 times per year    Active Member of [employer] or Organizations: Yes    Attends Banker Meetings: More than 4 times per year    Marital Status: Separated  Intimate Partner Violence: Not At Risk (11/26/2023)   Humiliation, Afraid,  Rape, and Kick questionnaire    Fear of Current or Ex-Partner: No    Emotionally Abused: No    Physically Abused: No    Sexually Abused: No    Past Surgical History:  Procedure Laterality Date   CARDIOVERSION N/A 10/03/2021   Procedure: CARDIOVERSION;  Surgeon: Mardell Shade, MD;  Location: [facility] ENDOSCOPY;  Service: Cardiovascular;  Laterality: N/A;   COLON SURGERY  2006   colon resection for polyps   COLONOSCOPY WITH PROPOFOL  N/A 10/31/2016   Procedure: COLONOSCOPY WITH PROPOFOL ;  Surgeon: Sergio Dandy, MD;  Location: [facility] ENDOSCOPY;  Service: Endoscopy;  Laterality: N/A;   HIATAL HERNIA REPAIR  1991   INSERTION OF MESH N/A 02/20/2015   Procedure: INSERTION OF MESH;  Surgeon: Ayesha Lente, MD;  Location: [facility] ORS;  Service: General;  Laterality: N/A;   LAPAROSCOPIC GASTRIC BANDING  2011   VENTRAL HERNIA REPAIR N/A 02/20/2015   Procedure: OPEN REPAIR VENTRAL INCISIONAL HERNIA;  Surgeon: Ayesha Lente, MD;  Location: [facility] ORS;  Service: General;  Laterality: N/A;   WISDOM TOOTH EXTRACTION      Family History  Problem Relation Age of Onset   Heart disease Mother    High blood pressure Mother    Diabetes Mother    Hypertension Brother     Heart failure Brother    Stroke Brother    Heart disease Maternal Aunt    Cancer Maternal Uncle        pancreatic   Heart disease Maternal Uncle    Rheum arthritis Maternal Grandmother    High blood pressure Maternal Grandmother    Colon cancer Neg Hx     Allergies  Allergen Reactions   Hydromet [Hydrocodone  Bit-Homatrop Mbr] Itching   Omnipaque  [Iohexol ] Hives and Itching    Itching and hives develop several hours after injection   Iodine-131 Itching   Penicillins Itching   Red Dye #40 (Allura Red) Itching    Current Outpatient Medications on File Prior to Visit  Medication Sig Dispense Refill   ALPRAZolam  (XANAX ) 0.5 MG tablet Take 0.5 mg by mouth daily as needed for anxiety.     amiodarone  (PACERONE ) 200 MG tablet Take 2 tablets (400 mg total) by mouth daily. 60 tablet 1   apixaban  (ELIQUIS ) 5 MG TABS tablet Take 1 tablet (5 mg total) by mouth 2 (two) times daily. 180 tablet 3   B Complex Vitamins (B COMPLEX PO) Take 1 each by mouth daily. OTC B-Complex gummy supplement.     Cholecalciferol (VITAMIN D -3 PO) Take 1 each by mouth daily. OTC Vitamin D3 gummy supplement     furosemide  (LASIX ) 40 MG tablet Take 1 tablet (40 mg total) by mouth as needed.     meloxicam (MOBIC) 15 MG tablet Take 15 mg by mouth daily as needed for pain.     Multiple Vitamins-Minerals (MENS MULTIVITAMIN GUMMIES) CHEW Chew 1 each by mouth daily.     OVER THE COUNTER MEDICATION Take 1 each by mouth daily. OTC Keto + ACV gummy supplement     triamcinolone  cream (KENALOG ) 0.1 % APPLY TOPICALLY 2 TIMES DAILY AS NEEDED (RASH). 30 g 0   No current facility-administered medications on file prior to visit.    BP (!) 160/100   Pulse 70   Temp 98.5 F (36.9 C) (Oral)   Ht 6\' 1"  (1.854 m)   Wt 287 lb (130.2 kg)   SpO2 95%   BMI 37.87 kg/m       Objective:   Physical Exam  Vitals and nursing note reviewed.  Constitutional:      General: He is not in acute distress.    Appearance: Normal appearance.  He is obese. He is not ill-appearing.  HENT:     Head: Normocephalic and atraumatic.     Right Ear: Tympanic membrane, ear canal and external ear normal. There is no impacted cerumen.     Left Ear: Tympanic membrane, ear canal and external ear normal. There is no impacted cerumen.     Nose: No congestion or rhinorrhea.     Right Turbinates: Enlarged and swollen.     Left Turbinates: Enlarged and swollen.     Right Sinus: Maxillary sinus tenderness present.     Left Sinus: Maxillary sinus tenderness present.     Mouth/Throat:     Mouth: Mucous membranes are moist.     Pharynx: Oropharynx is clear.  Eyes:     Extraocular Movements: Extraocular movements intact.     Conjunctiva/sclera: Conjunctivae normal.     Pupils: Pupils are equal, round, and reactive to light.  Neck:     Vascular: No carotid bruit.  Cardiovascular:     Rate and Rhythm: Normal rate and regular rhythm.     Pulses: Normal pulses.     Heart sounds: No murmur heard.    No friction rub. No gallop.  Pulmonary:     Effort: Pulmonary effort is normal.     Breath sounds: Normal breath sounds.  Abdominal:     General: Abdomen is flat. Bowel sounds are normal. There is no distension.     Palpations: Abdomen is soft. There is no mass.     Tenderness: There is no abdominal tenderness. There is no guarding or rebound.     Hernia: No hernia is present.  Musculoskeletal:        General: Normal range of motion.     Cervical back: Normal range of motion and neck supple.  Lymphadenopathy:     Cervical: No cervical adenopathy.  Skin:    General: Skin is warm and dry.     Capillary Refill: Capillary refill takes less than 2 seconds.  Neurological:     General: No focal deficit present.     Mental Status: He is alert and oriented to person, place, and time.  Psychiatric:        Mood and Affect: Mood normal.        Behavior: Behavior normal.        Thought Content: Thought content normal.        Judgment: Judgment normal.         Assessment & Plan:  1. Routine general medical examination at a health care facility (Primary) Today patient counseled on age appropriate routine health concerns for screening and prevention, each reviewed and up to date or declined. Immunizations reviewed and up to date or declined. Labs ordered and reviewed. Risk factors for depression reviewed and negative. Hearing function and visual acuity are intact. ADLs screened and addressed as needed. Functional ability and level of safety reviewed and appropriate. Education, counseling and referrals performed based on assessed risks today. Patient provided with a copy of personalized plan for preventive services.   2. Essential hypertension - Well controlled. No change in medication  - Lipid panel; Future - TSH; Future - CBC; Future - Comprehensive metabolic panel with GFR; Future  3. Insomnia, unspecified type - Continue with xanax  PRN - does not need a refill at this time   4. OSA (obstructive sleep apnea) -  Continue with CPAP - Lipid panel; Future - TSH; Future - CBC; Future - Comprehensive metabolic panel with GFR; Future  5. Chronic diastolic congestive heart failure (HCC) - Continue with lasix   - Lipid panel; Future - TSH; Future - CBC; Future - Comprehensive metabolic panel with GFR; Future  6. Hypertrophic obstructive cardiomyopathy (HCC) - Per cardiology  - Lipid panel; Future - TSH; Future - CBC; Future - Comprehensive metabolic panel with GFR; Future  7. Class 2 severe obesity due to excess calories with serious comorbidity in adult, unspecified BMI (HCC) - Continue to work on weight loss through diet and exercise  - Lipid panel; Future - TSH; Future - CBC; Future - Comprehensive metabolic panel with GFR; Future  8. Atrial fibrillation with RVR (HCC) - SR today.  - Continue with Amiodarone  and Eliquis  - Lipid panel; Future - TSH; Future - CBC; Future - Comprehensive metabolic panel with GFR;  Future  9. Acute non-recurrent maxillary sinusitis  - doxycycline  (VIBRAMYCIN ) 100 MG capsule; Take 1 capsule (100 mg total) by mouth 2 (two) times daily.  Dispense: 14 capsule; Refill: 0 - benzonatate  (TESSALON ) 200 MG capsule; Take 1 capsule (200 mg total) by mouth 3 (three) times daily as needed.  Dispense: 30 capsule; Refill: 0  10. Prostate cancer screening  - PSA; Future  11. Need for tetanus booster  - Tdap vaccine greater than or equal to 7yo IM  Achol Azpeitia, NP

## 2024-01-08 NOTE — Patient Instructions (Signed)
 It was great seeing you today   We will follow up with you regarding your lab work   Please let me know if you need anything

## 2024-01-08 NOTE — Addendum Note (Signed)
 Addended by: Twyla Galeazzi R on: 01/08/2024 04:08 PM   Modules accepted: Orders

## 2024-01-09 LAB — LIPID PANEL
Cholesterol: 141 mg/dL (ref <200)
HDL: 84 mg/dL (ref >=40)
LDL Cholesterol (Calc): 43 mg/dL
Non-HDL Cholesterol (Calc): 57 mg/dL (ref <130)
Total CHOL/HDL Ratio: 1.7 (calc) (ref <5.0)
Triglycerides: 60 mg/dL (ref <150)

## 2024-01-09 LAB — CBC
HCT: 37.6 % — ABNORMAL LOW (ref 38.5–50.0)
Hemoglobin: 12.6 g/dL — ABNORMAL LOW (ref 13.2–17.1)
MCH: 32.1 pg (ref 27.0–33.0)
MCHC: 33.5 g/dL (ref 32.0–36.0)
MCV: 95.9 fL (ref 80.0–100.0)
MPV: 10.8 fL (ref 7.5–12.5)
Platelets: 280 10*3/uL (ref 140–400)
RBC: 3.92 10*6/uL — ABNORMAL LOW (ref 4.20–5.80)
RDW: 12.9 % (ref 11.0–15.0)
WBC: 8.1 10*3/uL (ref 3.8–10.8)

## 2024-01-09 LAB — COMPREHENSIVE METABOLIC PANEL WITH GFR
AG Ratio: 1.4 (calc) (ref 1.0–2.5)
ALT: 17 U/L (ref 9–46)
AST: 25 U/L (ref 10–35)
Albumin: 3.9 g/dL (ref 3.6–5.1)
Alkaline phosphatase (APISO): 67 U/L (ref 35–144)
BUN: 9 mg/dL (ref 7–25)
CO2: 31 mmol/L (ref 20–32)
Calcium: 9.3 mg/dL (ref 8.6–10.3)
Chloride: 100 mmol/L (ref 98–110)
Creat: 1.13 mg/dL (ref 0.70–1.30)
Globulin: 2.8 g/dL (ref 1.9–3.7)
Glucose, Bld: 95 mg/dL (ref 65–99)
Potassium: 3.1 mmol/L — ABNORMAL LOW (ref 3.5–5.3)
Sodium: 143 mmol/L (ref 135–146)
Total Bilirubin: 0.8 mg/dL (ref 0.2–1.2)
Total Protein: 6.7 g/dL (ref 6.1–8.1)
eGFR: 75 mL/min/{1.73_m2} (ref >=60)

## 2024-01-09 LAB — PSA: PSA: 0.92 ng/mL (ref <=4.00)

## 2024-01-09 LAB — TSH: TSH: 0.91 m[IU]/L (ref 0.40–4.50)

## 2024-01-13 ENCOUNTER — Ambulatory Visit: Payer: Self-pay | Admitting: Adult Health

## 2024-01-13 ENCOUNTER — Other Ambulatory Visit: Payer: Self-pay | Admitting: Adult Health

## 2024-01-13 DIAGNOSIS — E876 Hypokalemia: Secondary | ICD-10-CM

## 2024-01-13 DIAGNOSIS — R710 Precipitous drop in hematocrit: Secondary | ICD-10-CM

## 2024-01-13 NOTE — Telephone Encounter (Signed)
 Copied from CRM [id number]. Topic: Clinical - Lab/Test Results >> Jan 13, 2024 12:17 PM Magdalene [employer] wrote: Reason for CRM: Patient returning call for lab results. I relayed notes and patient expressed understanding.

## 2024-01-13 NOTE — Telephone Encounter (Signed)
 Noted

## 2024-01-19 ENCOUNTER — Encounter: Payer: Self-pay | Admitting: Adult Health

## 2024-01-19 ENCOUNTER — Telehealth (INDEPENDENT_AMBULATORY_CARE_PROVIDER_SITE_OTHER): Admitting: Adult Health

## 2024-01-19 VITALS — Ht 73.0 in | Wt 287.0 lb

## 2024-01-19 DIAGNOSIS — R0602 Shortness of breath: Secondary | ICD-10-CM

## 2024-01-19 DIAGNOSIS — R600 Localized edema: Secondary | ICD-10-CM

## 2024-01-19 NOTE — Telephone Encounter (Signed)
 Please advise if a virtual visit is appropriate.

## 2024-01-19 NOTE — Telephone Encounter (Signed)
 Pt scheduled

## 2024-01-19 NOTE — Progress Notes (Signed)
 Virtual Visit via Video Note  I connected with Chad Lyons on 01/19/24 at  5:00 PM EDT by a video enabled telemedicine application and verified that I am speaking with the correct person using two identifiers.  Location patient: home Location provider:work or home office Persons participating in the virtual visit: patient, provider  I discussed the limitations of evaluation and management by telemedicine and the availability of in person appointments. The patient expressed understanding and agreed to proceed.   HPI: 58 year old male who  has a past medical history of Diverticulitis, EKG, abnormal, GERD (gastroesophageal reflux disease), Heart murmur, Hypertension, OSA (obstructive sleep apnea) (11/27/2016), and Ventral incisional hernia.  He reports that he recently drove from his home in [place]  to [place] for work and was in the car for 11 hours.  He noted that his legs became more swollen while driving, felt short of breath with minimal exertion, has a cough and wheezing.  He reports that he is wheezing using his CPAP at night.  He has known history of HFrEF and hypertrophic cardiomyopathy as well as PAF.  He is taking his Lasix  but has not urinating as much as he would think he should.    ROS: See pertinent positives and negatives per HPI.  Past Medical History:  Diagnosis Date   Diverticulitis    HX OF   EKG, abnormal    HX OF LEFT ANTERIO FASCICULAR BLOCK ON 02-15-15 EKG   GERD (gastroesophageal reflux disease)    Heart murmur    NOW RESOLVED   Hypertension    OSA (obstructive sleep apnea) 11/27/2016   Ventral incisional hernia     Past Surgical History:  Procedure Laterality Date   CARDIOVERSION N/A 10/03/2021   Procedure: CARDIOVERSION;  Surgeon: Mardell Shade, MD;  Location: [facility] ENDOSCOPY;  Service: Cardiovascular;  Laterality: N/A;   COLON SURGERY  2006   colon resection for polyps   COLONOSCOPY WITH PROPOFOL  N/A 10/31/2016   Procedure: COLONOSCOPY WITH  PROPOFOL ;  Surgeon: Sergio Dandy, MD;  Location: [facility] ENDOSCOPY;  Service: Endoscopy;  Laterality: N/A;   HIATAL HERNIA REPAIR  1991   INSERTION OF MESH N/A 02/20/2015   Procedure: INSERTION OF MESH;  Surgeon: Ayesha Lente, MD;  Location: [facility] ORS;  Service: General;  Laterality: N/A;   LAPAROSCOPIC GASTRIC BANDING  2011   VENTRAL HERNIA REPAIR N/A 02/20/2015   Procedure: OPEN REPAIR VENTRAL INCISIONAL HERNIA;  Surgeon: Ayesha Lente, MD;  Location: [facility] ORS;  Service: General;  Laterality: N/A;   WISDOM TOOTH EXTRACTION      Family History  Problem Relation Age of Onset   Heart disease Mother    High blood pressure Mother    Diabetes Mother    Hypertension Brother    Heart failure Brother    Stroke Brother    Heart disease Maternal Aunt    Cancer Maternal Uncle        pancreatic   Heart disease Maternal Uncle    Rheum arthritis Maternal Grandmother    High blood pressure Maternal Grandmother    Colon cancer Neg Hx        Current Outpatient Medications:    ALPRAZolam  (XANAX ) 0.5 MG tablet, Take 0.5 mg by mouth daily as needed for anxiety., Disp: , Rfl:    amiodarone  (PACERONE ) 200 MG tablet, Take 2 tablets (400 mg total) by mouth daily., Disp: 60 tablet, Rfl: 1   apixaban  (ELIQUIS ) 5 MG TABS tablet, Take 1 tablet (5 mg total) by mouth  2 (two) times daily., Disp: 180 tablet, Rfl: 3   B Complex Vitamins (B COMPLEX PO), Take 1 each by mouth daily. OTC B-Complex gummy supplement., Disp: , Rfl:    benzonatate  (TESSALON ) 200 MG capsule, Take 1 capsule (200 mg total) by mouth 3 (three) times daily as needed., Disp: 30 capsule, Rfl: 0   Cholecalciferol (VITAMIN D -3 PO), Take 1 each by mouth daily. OTC Vitamin D3 gummy supplement, Disp: , Rfl:    doxycycline  (VIBRAMYCIN ) 100 MG capsule, Take 1 capsule (100 mg total) by mouth 2 (two) times daily., Disp: 14 capsule, Rfl: 0   furosemide  (LASIX ) 40 MG tablet, Take 1 tablet (40 mg total) by mouth as needed., Disp: , Rfl:    meloxicam  (MOBIC) 15 MG tablet, Take 15 mg by mouth daily as needed for pain., Disp: , Rfl:    Multiple Vitamins-Minerals (MENS MULTIVITAMIN GUMMIES) CHEW, Chew 1 each by mouth daily., Disp: , Rfl:    OVER THE COUNTER MEDICATION, Take 1 each by mouth daily. OTC Keto + ACV gummy supplement, Disp: , Rfl:    triamcinolone  cream (KENALOG ) 0.1 %, APPLY TOPICALLY 2 TIMES DAILY AS NEEDED (RASH)., Disp: 30 g, Rfl: 0  EXAM:  VITALS per patient if applicable:  GENERAL: alert, oriented, appears well and in no acute distress  HEENT: atraumatic, conjunttiva clear, no obvious abnormalities on inspection of external nose and ears  NECK: normal movements of the head and neck  LUNGS: on inspection no signs of respiratory distress, breathing rate appears normal, no obvious gross SOB, gasping or wheezing  CV: no obvious cyanosis  MS: moves all visible extremities without noticeable abnormality  PSYCH/NEURO: pleasant and cooperative, no obvious depression or anxiety, speech and thought processing grossly intact  ASSESSMENT AND PLAN:  Discussed the following assessment and plan:  1. Shortness of breath (Primary) - I am worried that he may be having an acute exacerbation of heart failure.  He is going to try and get in with his cardiologist tomorrow morning and if he is not able to see him then we will see him in the office tomorrow afternoon at 130.  He was encouraged to continue with his Lasix  and all cardiac medication.  If symptoms worsen he will go to the emergency room.  2. Lower extremity edema       I discussed the assessment and treatment plan with the patient. The patient was provided an opportunity to ask questions and all were answered. The patient agreed with the plan and demonstrated an understanding of the instructions.   The patient was advised to call back or seek an in-person evaluation if the symptoms worsen or if the condition fails to improve as anticipated.  Time spent with patient  today was 31 minutes which consisted of chart review, discussing SOB lower extremity edema, work up, treatment answering questions and documentation.    Saba Neuman, NP

## 2024-01-20 ENCOUNTER — Emergency Department (HOSPITAL_COMMUNITY)

## 2024-01-20 ENCOUNTER — Encounter (HOSPITAL_COMMUNITY): Payer: Self-pay | Admitting: *Deleted

## 2024-01-20 ENCOUNTER — Other Ambulatory Visit: Payer: Self-pay | Admitting: Cardiology

## 2024-01-20 ENCOUNTER — Telehealth (HOSPITAL_COMMUNITY): Payer: Self-pay

## 2024-01-20 ENCOUNTER — Inpatient Hospital Stay (HOSPITAL_COMMUNITY)
Admission: EM | Admit: 2024-01-20 | Discharge: 2024-01-28 | DRG: 291 | Disposition: A | Discharge status: Home / Self Care | Attending: Internal Medicine | Admitting: Internal Medicine

## 2024-01-20 ENCOUNTER — Ambulatory Visit: Admitting: Adult Health

## 2024-01-20 ENCOUNTER — Other Ambulatory Visit: Payer: Self-pay

## 2024-01-20 DIAGNOSIS — I480 Paroxysmal atrial fibrillation: Secondary | ICD-10-CM | POA: Diagnosis present

## 2024-01-20 DIAGNOSIS — E876 Hypokalemia: Secondary | ICD-10-CM | POA: Diagnosis present

## 2024-01-20 DIAGNOSIS — I110 Hypertensive heart disease with heart failure: Principal | ICD-10-CM | POA: Diagnosis present

## 2024-01-20 DIAGNOSIS — I77810 Thoracic aortic ectasia: Secondary | ICD-10-CM | POA: Diagnosis present

## 2024-01-20 DIAGNOSIS — Z6838 Body mass index (BMI) 38.0-38.9, adult: Secondary | ICD-10-CM | POA: Diagnosis not present

## 2024-01-20 DIAGNOSIS — G4733 Obstructive sleep apnea (adult) (pediatric): Secondary | ICD-10-CM | POA: Diagnosis present

## 2024-01-20 DIAGNOSIS — Z7901 Long term (current) use of anticoagulants: Secondary | ICD-10-CM

## 2024-01-20 DIAGNOSIS — F17290 Nicotine dependence, other tobacco product, uncomplicated: Secondary | ICD-10-CM | POA: Diagnosis present

## 2024-01-20 DIAGNOSIS — E860 Dehydration: Secondary | ICD-10-CM | POA: Diagnosis present

## 2024-01-20 DIAGNOSIS — E66812 Obesity, class 2: Secondary | ICD-10-CM | POA: Diagnosis present

## 2024-01-20 DIAGNOSIS — Z888 Allergy status to other drugs, medicaments and biological substances status: Secondary | ICD-10-CM | POA: Diagnosis not present

## 2024-01-20 DIAGNOSIS — Z885 Allergy status to narcotic agent status: Secondary | ICD-10-CM | POA: Diagnosis not present

## 2024-01-20 DIAGNOSIS — Z833 Family history of diabetes mellitus: Secondary | ICD-10-CM | POA: Diagnosis not present

## 2024-01-20 DIAGNOSIS — J9601 Acute respiratory failure with hypoxia: Secondary | ICD-10-CM | POA: Diagnosis present

## 2024-01-20 DIAGNOSIS — K219 Gastro-esophageal reflux disease without esophagitis: Secondary | ICD-10-CM | POA: Diagnosis present

## 2024-01-20 DIAGNOSIS — I5043 Acute on chronic combined systolic (congestive) and diastolic (congestive) heart failure: Secondary | ICD-10-CM | POA: Diagnosis present

## 2024-01-20 DIAGNOSIS — F419 Anxiety disorder, unspecified: Secondary | ICD-10-CM | POA: Diagnosis present

## 2024-01-20 DIAGNOSIS — I447 Left bundle-branch block, unspecified: Secondary | ICD-10-CM | POA: Diagnosis present

## 2024-01-20 DIAGNOSIS — F17210 Nicotine dependence, cigarettes, uncomplicated: Secondary | ICD-10-CM | POA: Diagnosis present

## 2024-01-20 DIAGNOSIS — R001 Bradycardia, unspecified: Secondary | ICD-10-CM | POA: Diagnosis not present

## 2024-01-20 DIAGNOSIS — Z880 Allergy status to penicillin: Secondary | ICD-10-CM

## 2024-01-20 DIAGNOSIS — I10 Essential (primary) hypertension: Secondary | ICD-10-CM | POA: Diagnosis present

## 2024-01-20 DIAGNOSIS — I509 Heart failure, unspecified: Principal | ICD-10-CM

## 2024-01-20 DIAGNOSIS — Z9102 Food additives allergy status: Secondary | ICD-10-CM

## 2024-01-20 DIAGNOSIS — Z79899 Other long term (current) drug therapy: Secondary | ICD-10-CM | POA: Diagnosis not present

## 2024-01-20 DIAGNOSIS — Z792 Long term (current) use of antibiotics: Secondary | ICD-10-CM

## 2024-01-20 DIAGNOSIS — Z8249 Family history of ischemic heart disease and other diseases of the circulatory system: Secondary | ICD-10-CM | POA: Diagnosis not present

## 2024-01-20 DIAGNOSIS — I5033 Acute on chronic diastolic (congestive) heart failure: Secondary | ICD-10-CM | POA: Diagnosis not present

## 2024-01-20 DIAGNOSIS — I421 Obstructive hypertrophic cardiomyopathy: Secondary | ICD-10-CM | POA: Diagnosis present

## 2024-01-20 LAB — CBC
HCT: 37.9 % — ABNORMAL LOW (ref 39.0–52.0)
Hemoglobin: 12.1 g/dL — ABNORMAL LOW (ref 13.0–17.0)
MCH: 31.9 pg (ref 26.0–34.0)
MCHC: 31.9 g/dL (ref 30.0–36.0)
MCV: 100 fL (ref 80.0–100.0)
Platelets: 248 10*3/uL (ref 150–400)
RBC: 3.79 MIL/uL — ABNORMAL LOW (ref 4.22–5.81)
RDW: 13.2 % (ref 11.5–15.5)
WBC: 7.7 10*3/uL (ref 4.0–10.5)
nRBC: 0 % (ref 0.0–0.2)

## 2024-01-20 LAB — BASIC METABOLIC PANEL WITH GFR
Anion gap: 13 (ref 5–15)
BUN: 13 mg/dL (ref 6–20)
CO2: 26 mmol/L (ref 22–32)
Calcium: 9 mg/dL (ref 8.9–10.3)
Chloride: 100 mmol/L (ref 98–111)
Creatinine, Ser: 1.17 mg/dL (ref 0.61–1.24)
GFR, Estimated: >60 mL/min (ref >60)
Glucose, Bld: 93 mg/dL (ref 70–99)
Potassium: 3.1 mmol/L — ABNORMAL LOW (ref 3.5–5.1)
Sodium: 139 mmol/L (ref 135–145)

## 2024-01-20 LAB — TROPONIN I (HIGH SENSITIVITY)
Troponin I (High Sensitivity): 47 ng/L — ABNORMAL HIGH (ref <18)
Troponin I (High Sensitivity): 46 ng/L — ABNORMAL HIGH (ref <18)

## 2024-01-20 LAB — TSH: TSH: 1.488 u[IU]/mL (ref 0.350–4.500)

## 2024-01-20 LAB — BRAIN NATRIURETIC PEPTIDE: B Natriuretic Peptide: 1170.1 pg/mL — ABNORMAL HIGH (ref 0.0–100.0)

## 2024-01-20 MED ORDER — BISACODYL 5 MG PO TBEC: 5.0000 mg | DELAYED_RELEASE_TABLET | Freq: Every day | ORAL | Status: DC | PRN

## 2024-01-20 MED ORDER — ONDANSETRON HCL 4 MG/2ML IJ SOLN: 4.0000 mg | Freq: Four times a day (QID) | INTRAMUSCULAR | Status: DC | PRN

## 2024-01-20 MED ORDER — POTASSIUM CHLORIDE CRYS ER 20 MEQ PO TBCR
40.0000 meq | EXTENDED_RELEASE_TABLET | Freq: Once | ORAL | Status: AC
  Administered 2024-01-20: 40 meq via ORAL
  Filled 2024-01-20: qty 2

## 2024-01-20 MED ORDER — APIXABAN 5 MG PO TABS
5.0000 mg | ORAL_TABLET | Freq: Two times a day (BID) | ORAL | Status: DC
  Administered 2024-01-20 – 2024-01-28 (×16): 5 mg via ORAL
  Filled 2024-01-20 (×12): qty 1
  Filled 2024-01-20: qty 2
  Filled 2024-01-20 (×3): qty 1

## 2024-01-20 MED ORDER — AMIODARONE HCL 200 MG PO TABS
400.0000 mg | ORAL_TABLET | Freq: Every day | ORAL | Status: DC
  Administered 2024-01-20 – 2024-01-26 (×7): 400 mg via ORAL
  Filled 2024-01-20 (×8): qty 2

## 2024-01-20 MED ORDER — FUROSEMIDE 10 MG/ML IJ SOLN
80.0000 mg | Freq: Once | INTRAMUSCULAR | Status: AC
  Administered 2024-01-20: 80 mg via INTRAVENOUS
  Filled 2024-01-20: qty 8

## 2024-01-20 MED ORDER — ONDANSETRON HCL 4 MG PO TABS: 4.0000 mg | ORAL_TABLET | Freq: Four times a day (QID) | ORAL | Status: DC | PRN

## 2024-01-20 MED ORDER — ACETAMINOPHEN 650 MG RE SUPP: 650.0000 mg | Freq: Four times a day (QID) | RECTAL | Status: DC | PRN

## 2024-01-20 MED ORDER — ACETAMINOPHEN 325 MG PO TABS
650.0000 mg | ORAL_TABLET | Freq: Four times a day (QID) | ORAL | Status: DC | PRN
  Administered 2024-01-20 – 2024-01-23 (×3): 650 mg via ORAL
  Filled 2024-01-20 (×4): qty 2

## 2024-01-20 MED ORDER — CARVEDILOL 3.125 MG PO TABS
3.1250 mg | ORAL_TABLET | Freq: Two times a day (BID) | ORAL | Status: DC
  Administered 2024-01-20 – 2024-01-27 (×8): 3.125 mg via ORAL
  Filled 2024-01-20 (×11): qty 1

## 2024-01-20 MED ORDER — ALPRAZOLAM 0.5 MG PO TABS
0.5000 mg | ORAL_TABLET | Freq: Every day | ORAL | Status: DC | PRN
  Administered 2024-01-20 – 2024-01-22 (×2): 0.5 mg via ORAL
  Filled 2024-01-20: qty 1
  Filled 2024-01-20: qty 2

## 2024-01-20 NOTE — Progress Notes (Signed)
   01/20/24 2226  BiPAP/CPAP/SIPAP  $ Non-Invasive Ventilator  Non-Invasive Vent Set Up  $ Face Mask Large  Yes  BiPAP/CPAP/SIPAP Pt Type Adult  BiPAP/CPAP/SIPAP Resmed  Mask Type Full face mask  Dentures removed? Not applicable  Mask Size Large  Respiratory Rate 20 breaths/min  EPAP  (CPAP pressure of 4)  FiO2 (%) 21 %  Patient Home Machine No  Patient Home Mask No  Patient Home Tubing No  Auto Titrate No  CPAP/SIPAP surface wiped down Yes  Device Plugged into RED Power Outlet Yes  BiPAP/CPAP /SiPAP Vitals  Pulse Rate (!) 57  Resp 20  SpO2 97 %  Bilateral Breath Sounds Clear;Diminished  MEWS Score/Color  MEWS Score 0  MEWS Score Color Green

## 2024-01-20 NOTE — ED Provider Triage Note (Signed)
 Emergency Medicine Provider Triage Evaluation Note  Chad Lyons , a 58 y.o. male  was evaluated in triage.  Pt complains of shortness of breath and leg swelling.  Pt has a history of chf.   Review of Systems  Positive: Short of breath Negative: fever  Physical Exam  BP (!) 160/102   Pulse 64   Temp 98.7 F (37.1 C)   Resp 16   SpO2 97%  Gen:   Awake, no distress   Resp:  Normal effort  MSK:   Moves extremities without difficulty  Other:  Swollen bilat legs   Medical Decision Making  Medically screening exam initiated at 2:10 PM.  Appropriate orders placed.  Chad Lyons was informed that the remainder of the evaluation will be completed by another provider, this initial triage assessment does not replace that evaluation, and the importance of remaining in the ED until their evaluation is complete.     Sandi Crosby, PA-C 01/20/24 1610

## 2024-01-20 NOTE — Telephone Encounter (Signed)
 Patient called office this am with concerns about increased weight and shortness of breath. Patient had been traveling and is back home in [place] . Patient states his weight is up swelling in legs to thighs. Wearing compression hose. Patient advised to go to ER. Patient agreeable

## 2024-01-20 NOTE — ED Triage Notes (Signed)
 Pt is here for evaluation due to 20lb weight gain over the past 2 weeks. Pt has notable edema to BLE and reports sob and some tightness in his chest.  Pt spoke with Dr. Lorayne Rocks who wanted him to come here for evaluation.  Pt has has also had URI with cough and congestion.

## 2024-01-20 NOTE — H&P (Signed)
 [facility] H&P   Patient Demographics:    Chad Lyons, is a 58 y.o. male  MRN: [id number]   DOB - 1965-11-11  Admit Date - 01/20/2024  Outpatient Primary MD for the patient is Alto Atta, NP  Outpatient Specialists: Dr. Dasie Epps  Patient coming from: Home  Chief Complaint  Patient presents with   Congestive Heart Failure      HPI:    Chad Lyons  is a 58 y.o. male, with history of paroxysmal atrial fibrillation, s/p cardioversion, on amiodarone  and Eliquis , chronic diastolic CHF, GERD, obstructive sleep apnea uses CPAP at night, anxiety, who was in his usual state of health until about a month ago when he went into atrial fibrillation, patient prior to that episode was taking 120 mg of Lasix  daily, at the time of atrial fibrillation it was thought that he was over diuresed and that put him in A-fib RVR, at that time his Lasix  was stopped and cut down to 40 mg daily as needed.  Over the last  month since his Lasix  dose was reduced patient has noticed a gradually progressive rise in his weight, lower extremity swelling, initially dry cough now producing white frothy phlegm, problems laying flat as he becomes short of breath, now exertional shortness of breath, presented to the ER where he has 2+ edema, crackles, elevated BNP, diagnosed with acute on chronic diastolic CHF and I was requested to admit the patient.  Patient currently denies any fever or chills, no headache, no chest pain or palpitations, no abdominal pain or discomfort, no blood in stool or urine, no dysuria, no focal weakness.  At rest he is in a semiupright position and not short of breath however gets short of breath upon laying flat or upon exertion.    Review of systems:    A full 10 point Review of Systems was done, except as stated above, all other Review of Systems were negative.   With Past History of the following :    Past Medical History:  Diagnosis Date   Diverticulitis    HX OF   EKG, abnormal    HX OF LEFT ANTERIO FASCICULAR BLOCK ON 02-15-15 EKG   GERD (gastroesophageal reflux disease)    Heart murmur    NOW RESOLVED   Hypertension    OSA (obstructive sleep apnea) 11/27/2016  Ventral incisional hernia       Past Surgical History:  Procedure Laterality Date   CARDIOVERSION N/A 10/03/2021   Procedure: CARDIOVERSION;  Surgeon: Mardell Shade, MD;  Location: [facility] ENDOSCOPY;  Service: Cardiovascular;  Laterality: N/A;   COLON SURGERY  2006   colon resection for polyps   COLONOSCOPY WITH PROPOFOL  N/A 10/31/2016   Procedure: COLONOSCOPY WITH PROPOFOL ;  Surgeon: Sergio Dandy, MD;  Location: [facility] ENDOSCOPY;  Service: Endoscopy;  Laterality: N/A;   HIATAL HERNIA REPAIR  1991   INSERTION OF MESH N/A 02/20/2015   Procedure: INSERTION OF MESH;  Surgeon: Ayesha Lente, MD;  Location: [facility] ORS;  Service: General;  Laterality: N/A;   LAPAROSCOPIC GASTRIC BANDING  2011   VENTRAL HERNIA REPAIR N/A 02/20/2015   Procedure: OPEN REPAIR VENTRAL INCISIONAL HERNIA;  Surgeon: Ayesha Lente, MD;  Location: [facility] ORS;  Service: General;  Laterality: N/A;   WISDOM TOOTH EXTRACTION        Social History:     Social History   Tobacco Use   Smoking status: Former    Current packs/day: 0.50    Types: Cigarettes   Smokeless tobacco: Never   Tobacco comments:    Current Cigars 1 every other day -----QUIT cigarettes in 2006>> 1/2PPD x 15 years  Substance Use Topics   Alcohol use: Yes    Alcohol/week: 40.0 standard drinks of alcohol    Types: 40 Shots of liquor per week    Comment: 6-7 shots of liquor daily x 8 years. heavier on weekend        Family History :     Family History  Problem Relation Age of Onset   Heart  disease Mother    High blood pressure Mother    Diabetes Mother    Hypertension Brother    Heart failure Brother    Stroke Brother    Heart disease Maternal Aunt    Cancer Maternal Uncle        pancreatic   Heart disease Maternal Uncle    Rheum arthritis Maternal Grandmother    High blood pressure Maternal Grandmother    Colon cancer Neg Hx       Home Medications:   Prior to Admission medications   Medication Sig Start Date End Date Taking? Authorizing Provider  ALPRAZolam  (XANAX ) 0.5 MG tablet Take 0.5 mg by mouth daily as needed for anxiety.    [provider]  amiodarone  (PACERONE ) 200 MG tablet Take 2 tablets (400 mg total) by mouth daily. 11/27/23   Ruddy Corral M, PA-C  apixaban  (ELIQUIS ) 5 MG TABS tablet Take 1 tablet (5 mg total) by mouth 2 (two) times daily. 04/06/23   Milford, Arlice Bene, FNP  B Complex Vitamins (B COMPLEX PO) Take 1 each by mouth daily. OTC B-Complex gummy supplement.    [provider]  benzonatate  (TESSALON ) 200 MG capsule Take 1 capsule (200 mg total) by mouth 3 (three) times daily as needed. 01/08/24   Nafziger, Randel Buss, NP  Cholecalciferol (VITAMIN D -3 PO) Take 1 each by mouth daily. OTC Vitamin D3 gummy supplement    [provider]  doxycycline  (VIBRAMYCIN ) 100 MG capsule Take 1 capsule (100 mg total) by mouth 2 (two) times daily. 01/08/24   Nafziger, Randel Buss, NP  furosemide  (LASIX ) 40 MG tablet Take 1 tablet (40 mg total) by mouth as needed. 11/27/23   Ruddy Corral M, PA-C  meloxicam (MOBIC) 15 MG tablet Take 15 mg by mouth daily as needed for pain.  [provider]  Multiple Vitamins-Minerals (MENS MULTIVITAMIN GUMMIES) CHEW Chew 1 each by mouth daily.    [provider]  OVER THE COUNTER MEDICATION Take 1 each by mouth daily. OTC Keto + ACV gummy supplement    [provider]  triamcinolone  cream (KENALOG ) 0.1 % APPLY TOPICALLY 2 TIMES DAILY AS NEEDED (RASH). 10/01/23   Nafziger, Cory, NP      Allergies:     Allergies  Allergen Reactions   Hydromet [Hydrocodone  Bit-Homatrop Mbr] Itching   Omnipaque  [Iohexol ] Hives and Itching    Itching and hives develop several hours after injection   Iodine-131 Itching   Penicillins Itching   Red Dye #40 (Allura Red) Itching     Physical Exam:   Vitals  Blood pressure (!) 127/103, pulse 62, temperature 98.7 F (37.1 C), resp. rate (!) 22, SpO2 94%.   1. General Middle-aged obese African-American gentleman sitting up in hospital bed in no distress  2. Normal affect and insight, Not Suicidal or Homicidal, Awake Alert,   3. No F.N deficits, ALL C.Nerves Intact, Strength 5/5 all 4 extremities, Sensation intact all 4 extremities, Plantars down going.  4. Ears and Eyes appear Normal, Conjunctivae clear, PERRLA. Moist Oral Mucosa.  5. Supple Neck, No JVD, No cervical lymphadenopathy appriciated, No Carotid Bruits.  6. Symmetrical Chest wall movement, Good air movement bilaterally, bibasilar crackles  7. RRR, No Gallops, Rubs or Murmurs, No Parasternal Heave.  8. Positive Bowel Sounds, Abdomen Soft, No tenderness, No organomegaly appriciated,No rebound -guarding or rigidity.  9.  No Cyanosis, Normal Skin Turgor, No Skin Rash or Bruise.  2+ lower extremity edema  10. Good muscle tone,  joints appear normal , no effusions, Normal ROM.  11. No Palpable Lymph Nodes in Neck or Axillae      Data Review:   Recent Labs  Lab 01/20/24 1258  WBC 7.7  HGB 12.1*  HCT 37.9*  PLT 248  MCV 100.0  MCH 31.9  MCHC 31.9  RDW 13.2    Recent Labs  Lab 01/20/24 1258 01/20/24 1306  NA 139  --   K 3.1*  --   CL 100  --   CO2 26  --   ANIONGAP 13  --   GLUCOSE 93  --   BUN 13  --   CREATININE 1.17  --   BNP  --  1,170.1*  CALCIUM 9.0  --     Lab Results  Component Value Date   CHOL 141 01/08/2024   HDL 84 01/08/2024   LDLCALC 43 01/08/2024   TRIG 60 01/08/2024   CHOLHDL 1.7 01/08/2024    Recent Labs  Lab 01/20/24 1258  01/20/24 1306  BNP  --  1,170.1*  CALCIUM 9.0  --     Recent Labs  Lab 01/20/24 1258  WBC 7.7  PLT 248  CREATININE 1.17    Urinalysis    Component Value Date/Time   COLORURINE YELLOW 07/01/2023 1601   APPEARANCEUR Sl Cloudy (A) 07/01/2023 1601   LABSPEC 1.020 07/01/2023 1601   PHURINE 6.0 07/01/2023 1601   GLUCOSEU >=1000 (A) 07/01/2023 1601   HGBUR NEGATIVE 07/01/2023 1601   HGBUR negative 07/27/2009 1017   BILIRUBINUR neg 07/01/2023 1602   BILIRUBINUR NEGATIVE 07/01/2023 1601   KETONESUR TRACE (A) 07/01/2023 1601   PROTEINUR Negative 07/01/2023 1602   PROTEINUR NEGATIVE 02/22/2017 1132   UROBILINOGEN 0.2 (A) 07/01/2023 1602   UROBILINOGEN 0.2 07/01/2023 1601   NITRITE neg 07/01/2023 1602   NITRITE NEGATIVE 07/01/2023  1601   LEUKOCYTESUR Negative 07/01/2023 1602   LEUKOCYTESUR NEGATIVE 07/01/2023 1601      Imaging Results:    DG Chest 2 View Result Date: 01/20/2024 CLINICAL DATA:  Shortness of breath and some chest tightness. Bilateral lower extremity edema. 20 lb weight gain over the past 2 weeks. Upper respiratory tract infection with cough and congestion. EXAM: CHEST - 2 VIEW COMPARISON:  11/26/2023 FINDINGS: Enlarged cardiac silhouette with an increase in size. Mild increase in prominence of the pulmonary vasculature and interstitial markings. Interval small bilateral pleural effusions. Thoracolumbar spine degenerative changes. IMPRESSION: Interval development of mild congestive heart failure. Electronically Signed   By: Catherin Closs M.D.   On: 01/20/2024 13:54    My personal review of EKG: Rhythm NSR,   no Acute ST changes   Assessment & Plan:    1.  Acute on chronic diastolic CHF EF 55% on recent echocardiogram.  Likely due to recent change in his diuretic regimen, patient has clear evidence of 20 to 25 pound of fluid overload, he will be admitted to a telemetry bed, IV Lasix  80 mg given in the ER, placed on fluid restriction, supplemental oxygen  upon exertion,  low-dose Coreg , readdress Lasix  dose tomorrow and monitor.  Echo was done in April 2025 hence will not repeat.  2.  Paroxysmal atrial fibrillation.  [place] vas 2 score of greater than 3.  Currently in sinus, has been cardioverted before, continue amiodarone , low-dose Coreg , continue Eliquis  and monitor on telemetry.  3.  Obesity with OSA.  Follow-up with PCP for weight loss, BMI greater than 35, nighttime CPAP.  4.  GERD.  PPI.  5.  Anxiety.  Home dose benzodiazepine.   DVT Prophylaxis Eliquis   AM Labs Ordered, also please review Full Orders  Family Communication: Admission, patients condition and plan of care including tests being ordered have been discussed with the patient  who indicates understanding and agree with the plan and Code Status.  Code Status Full  Likely DC to  Home  Condition Fair  Consults called: None    Admission status: Inpt    Time spent in minutes : 35  Signature  -    Lynnwood Sauer M.D on 01/20/2024 at 4:29 PM   -  To page go to [email or web address]

## 2024-01-20 NOTE — ED Provider Notes (Signed)
 [place] [facility] Provider Note   CSN: [id number] Arrival date & time: 01/20/24  1212     History Chief Complaint  Patient presents with   Congestive Heart Failure    Chad Lyons is a 58 y.o. male patient with history of hypertension, congestive heart failure, OSA who presents the emergency department with a 2-week history of progressively worsening shortness of breath and bilateral lower extremity edema.  Patient states over the last 2 weeks he has been noticing he has been little little more winded than normal.  He states that he normally takes 120 mg of Lasix  a day but stopped roughly a month and a half ago as he went into A-fib and I thought the dehydration was the cause.  He was then switched to 40 mg/day as needed.  He started taking this about 2 weeks ago.  He recently increased the dose to 80 mg/day over the last couple of days.  He reports associated orthopnea.  He denies any chest pain, fever, chills.  He does endorse associated cough as well with some productivity.   Congestive Heart Failure       Home Medications Prior to Admission medications   Medication Sig Start Date End Date Taking? Authorizing Provider  ALPRAZolam  (XANAX ) 0.5 MG tablet Take 0.5 mg by mouth daily as needed for anxiety.    [provider]  amiodarone  (PACERONE ) 200 MG tablet Take 2 tablets (400 mg total) by mouth daily. 11/27/23   Ruddy Corral M, PA-C  apixaban  (ELIQUIS ) 5 MG TABS tablet Take 1 tablet (5 mg total) by mouth 2 (two) times daily. 04/06/23   Milford, Arlice Bene, FNP  B Complex Vitamins (B COMPLEX PO) Take 1 each by mouth daily. OTC B-Complex gummy supplement.    [provider]  benzonatate  (TESSALON ) 200 MG capsule Take 1 capsule (200 mg total) by mouth 3 (three) times daily as needed. 01/08/24   Nafziger, Randel Buss, NP  Cholecalciferol (VITAMIN D -3 PO) Take 1 each by mouth daily. OTC Vitamin D3 gummy supplement    [provider]  doxycycline  (VIBRAMYCIN ) 100 MG capsule Take 1 capsule (100 mg total) by mouth 2 (two) times daily. 01/08/24   Nafziger, Randel Buss, NP  furosemide  (LASIX ) 40 MG tablet Take 1 tablet (40 mg total) by mouth as needed. 11/27/23   Ruddy Corral M, PA-C  meloxicam (MOBIC) 15 MG tablet Take 15 mg by mouth daily as needed for pain.    [provider]  Multiple Vitamins-Minerals (MENS MULTIVITAMIN GUMMIES) CHEW Chew 1 each by mouth daily.    [provider]  OVER THE COUNTER MEDICATION Take 1 each by mouth daily. OTC Keto + ACV gummy supplement    [provider]  triamcinolone  cream (KENALOG ) 0.1 % APPLY TOPICALLY 2 TIMES DAILY AS NEEDED (RASH). 10/01/23   Nafziger, Randel Buss, NP      Allergies    Hydromet [hydrocodone  bit-homatrop mbr], Omnipaque  [iohexol ], Iodine-131, Penicillins, and Red dye #40 (allura red)    Review of Systems   Review of Systems  All other systems reviewed and are negative.   Physical Exam Updated Vital Signs BP (!) 127/103   Pulse 62   Temp 98.7 F (37.1 C)   Resp (!) 22   SpO2 94%  Physical Exam Vitals and nursing note reviewed.  Constitutional:      General: He is not in acute distress.    Appearance: Normal appearance.  HENT:     Head: Normocephalic  and atraumatic.  Eyes:     General:        Right eye: No discharge.        Left eye: No discharge.  Cardiovascular:     Comments: Regular rate and rhythm.  S1/S2 are distinct without any evidence of murmur, rubs, or gallops.  Radial pulses are 2+ bilaterally.  Dorsalis pedis pulses are 2+ bilaterally.   Pulmonary:     Comments: Clear to auscultation bilaterally.  Normal effort.  No respiratory distress.  No evidence of wheezes, rales, or rhonchi heard throughout. Abdominal:     General: Abdomen is flat. Bowel sounds are normal. There is no distension.     Tenderness: There is no abdominal tenderness. There is no guarding or rebound.  Musculoskeletal:        General: Normal  range of motion.     Cervical back: Neck supple.     Right lower leg: 2+ Edema present.     Left lower leg: 2+ Edema present.  Skin:    General: Skin is warm and dry.     Findings: No rash.  Neurological:     General: No focal deficit present.     Mental Status: He is alert.  Psychiatric:        Mood and Affect: Mood normal.        Behavior: Behavior normal.     ED Results / Procedures / Treatments   Labs (all labs ordered are listed, but only abnormal results are displayed) Labs Reviewed  BASIC METABOLIC PANEL WITH GFR - Abnormal; Notable for the following components:      Result Value   Potassium 3.1 (*)    All other components within normal limits  CBC - Abnormal; Notable for the following components:   RBC 3.79 (*)    Hemoglobin 12.1 (*)    HCT 37.9 (*)    All other components within normal limits  BRAIN NATRIURETIC PEPTIDE - Abnormal; Notable for the following components:   B Natriuretic Peptide 1,170.1 (*)    All other components within normal limits  TROPONIN I (HIGH SENSITIVITY) - Abnormal; Notable for the following components:   Troponin I (High Sensitivity) 47 (*)    All other components within normal limits  TSH  HEMOGLOBIN A1C  TROPONIN I (HIGH SENSITIVITY)    EKG EKG Interpretation Date/Time:  Wednesday January 20 2024 13:52:10 EDT Ventricular Rate:  57 PR Interval:  180 QRS Duration:  142 QT Interval:  454 QTC Calculation: 441 R Axis:   -68  Text Interpretation: Sinus bradycardia Left axis deviation Non-specific intra-ventricular conduction block Nonspecific T wave abnormality Abnormal ECG When compared with ECG of 04-Dec-2023 11:45, PREVIOUS ECG IS PRESENT since last tracing no significant change Confirmed by Hershel Los [phone]) on 01/20/2024 1:58:15 PM  Radiology DG Chest 2 View Result Date: 01/20/2024 CLINICAL DATA:  Shortness of breath and some chest tightness. Bilateral lower extremity edema. 20 lb weight gain over the past 2 weeks. Upper  respiratory tract infection with cough and congestion. EXAM: CHEST - 2 VIEW COMPARISON:  11/26/2023 FINDINGS: Enlarged cardiac silhouette with an increase in size. Mild increase in prominence of the pulmonary vasculature and interstitial markings. Interval small bilateral pleural effusions. Thoracolumbar spine degenerative changes. IMPRESSION: Interval development of mild congestive heart failure. Electronically Signed   By: Catherin Closs M.D.   On: 01/20/2024 13:54    Procedures Procedures    Medications Ordered in ED Medications  potassium chloride  SA (KLOR-CON  M) CR tablet 40 mEq (  has no administration in time range)  ALPRAZolam  (XANAX ) tablet 0.5 mg (has no administration in time range)  amiodarone  (PACERONE ) tablet 400 mg (has no administration in time range)  apixaban  (ELIQUIS ) tablet 5 mg (has no administration in time range)  carvedilol  (COREG ) tablet 3.125 mg (has no administration in time range)  acetaminophen  (TYLENOL ) tablet 650 mg (has no administration in time range)    Or  acetaminophen  (TYLENOL ) suppository 650 mg (has no administration in time range)  bisacodyl (DULCOLAX) EC tablet 5 mg (has no administration in time range)  ondansetron  (ZOFRAN ) tablet 4 mg (has no administration in time range)    Or  ondansetron  (ZOFRAN ) injection 4 mg (has no administration in time range)  furosemide  (LASIX ) injection 80 mg (80 mg Intravenous Given 01/20/24 1601)  potassium chloride  SA (KLOR-CON  M) CR tablet 40 mEq (40 mEq Oral Given 01/20/24 1600)    ED Course/ Medical Decision Making/ A&P Clinical Course as of 01/20/24 1631  Wed Jan 20, 2024  1603 CBC(!) There is some mild evidence of anemia which is comparable to most recent labs. [CF]  1603 Basic metabolic panel(!) Mild hypokalemia.  No other electrolyte abnormalities. [CF]  1603 Brain natriuretic peptide(!) Certainly meets criteria for heart failure. [CF]  1603 DG Chest 2 View There is some evidence of pulmonary edema although no  significant pleural effusions.  I do agree with the radiologist interpretation. [CF]  1618 I spoke with Dr. Zelda Hickman with [facility] who agrees to admit the patient.  [CF]    Clinical Course User Index [CF] Darletta Ehrich, PA-C   {   Click here for ABCD2, HEART and other calculators  Medical Decision Making JEDAIAH RATHBUN is a 58 y.o. male patient who presents to the emergency department today for further evaluation of shortness of breath and lower extremity edema.  Patient clinically appears volume overloaded.  Do not hear a ton of crackles in the bases but given the significant pitting edema and elevated BNP and findings on chest x-ray will likely admit for CHF exacerbation for diuresis.  I will also replete his potassium here.  Patient agreeable with plan.  He is stable for admission.   Amount and/or Complexity of Data Reviewed Labs: ordered. Decision-making details documented in ED Course. Radiology: ordered. Decision-making details documented in ED Course.  Risk Prescription drug management. Decision regarding hospitalization.    Final Clinical Impression(s) / ED Diagnoses Final diagnoses:  Acute congestive heart failure, unspecified heart failure type [facility])    Rx / DC Orders ED Discharge Orders     None         Olympia Bianchi 01/20/24 1631    Rosealee Concha, MD 01/20/24 2131

## 2024-01-20 NOTE — ED Notes (Signed)
 CCMD contacted

## 2024-01-21 DIAGNOSIS — I5033 Acute on chronic diastolic (congestive) heart failure: Secondary | ICD-10-CM

## 2024-01-21 LAB — CBC WITH DIFFERENTIAL/PLATELET
Abs Immature Granulocytes: 0.03 10*3/uL (ref 0.00–0.07)
Basophils Absolute: 0.1 10*3/uL (ref 0.0–0.1)
Basophils Relative: 1 %
Eosinophils Absolute: 0.2 10*3/uL (ref 0.0–0.5)
Eosinophils Relative: 3 %
HCT: 36 % — ABNORMAL LOW (ref 39.0–52.0)
Hemoglobin: 11.8 g/dL — ABNORMAL LOW (ref 13.0–17.0)
Immature Granulocytes: 1 %
Lymphocytes Relative: 17 %
Lymphs Abs: 1.1 10*3/uL (ref 0.7–4.0)
MCH: 33.4 pg (ref 26.0–34.0)
MCHC: 32.8 g/dL (ref 30.0–36.0)
MCV: 102 fL — ABNORMAL HIGH (ref 80.0–100.0)
Monocytes Absolute: 1 10*3/uL (ref 0.1–1.0)
Monocytes Relative: 15 %
Neutro Abs: 4.1 10*3/uL (ref 1.7–7.7)
Neutrophils Relative %: 63 %
Platelets: 202 10*3/uL (ref 150–400)
RBC: 3.53 MIL/uL — ABNORMAL LOW (ref 4.22–5.81)
RDW: 13.4 % (ref 11.5–15.5)
WBC: 6.5 10*3/uL (ref 4.0–10.5)
nRBC: 0 % (ref 0.0–0.2)

## 2024-01-21 LAB — BASIC METABOLIC PANEL WITH GFR
Anion gap: 10 (ref 5–15)
BUN: 12 mg/dL (ref 6–20)
CO2: 26 mmol/L (ref 22–32)
Calcium: 8.7 mg/dL — ABNORMAL LOW (ref 8.9–10.3)
Chloride: 104 mmol/L (ref 98–111)
Creatinine, Ser: 1.16 mg/dL (ref 0.61–1.24)
GFR, Estimated: >60 mL/min (ref >60)
Glucose, Bld: 91 mg/dL (ref 70–99)
Potassium: 3.2 mmol/L — ABNORMAL LOW (ref 3.5–5.1)
Sodium: 140 mmol/L (ref 135–145)

## 2024-01-21 LAB — HEMOGLOBIN A1C
Hgb A1c MFr Bld: 4.6 % — ABNORMAL LOW (ref 4.8–5.6)
Mean Plasma Glucose: 85.32 mg/dL

## 2024-01-21 LAB — BRAIN NATRIURETIC PEPTIDE: B Natriuretic Peptide: 670.5 pg/mL — ABNORMAL HIGH (ref 0.0–100.0)

## 2024-01-21 LAB — MAGNESIUM: Magnesium: 1.9 mg/dL (ref 1.7–2.4)

## 2024-01-21 MED ORDER — FUROSEMIDE 10 MG/ML IJ SOLN
40.0000 mg | Freq: Two times a day (BID) | INTRAMUSCULAR | Status: DC
  Administered 2024-01-21 – 2024-01-26 (×12): 40 mg via INTRAVENOUS
  Filled 2024-01-21 (×13): qty 4

## 2024-01-21 MED ORDER — POTASSIUM CHLORIDE CRYS ER 20 MEQ PO TBCR
40.0000 meq | EXTENDED_RELEASE_TABLET | Freq: Once | ORAL | Status: AC
  Administered 2024-01-21: 40 meq via ORAL
  Filled 2024-01-21: qty 2

## 2024-01-21 NOTE — Evaluation (Signed)
 Physical Therapy Evaluation Patient Details Name: Chad Lyons MRN: [id number] DOB: 23-Aug-1965 Today's Date: 01/21/2024  History of Present Illness  Patient is a 58 y/o male admitted 01/20/24 with SOB and LE edema.  Found to have CHF exacerbation.  PMH positive for PAF s/p cardioversion, CHF, GERD, OSA on CPAP.  Clinical Impression  Patient presents with mobility at his baseline.  Noted SpO2 drop with stair negotiation on RA to 86%, but recovered to 88% while walking with cues for PLB.  Feel he will continue to progress with further diuresis.  PT will sign off.         If plan is discharge home, recommend the following:     Can travel by private vehicle        Equipment Recommendations None recommended by PT  Recommendations for Other Services       Functional Status Assessment Patient has not had a recent decline in their functional status     Precautions / Restrictions Precautions Precautions: None      Mobility  Bed Mobility Overal bed mobility: Independent                  Transfers Overall transfer level: Independent                      Ambulation/Gait Ambulation/Gait assistance: Independent Gait Distance (Feet): 300 Feet Assistive device: None Gait Pattern/deviations: WFL(Within Functional Limits), Wide base of support, Decreased stride length       General Gait Details: mild decr stride length and wider BOS with pedal edema  Stairs Stairs: Yes Stairs assistance: Supervision Stair Management: Two rails, One rail Right, Alternating pattern, Forwards Number of Stairs: 10 General stair comments: assist for lines  Wheelchair Mobility     Tilt Bed    Modified Rankin (Stroke Patients Only)       Balance Overall balance assessment: Mild deficits observed, not formally tested                                           Pertinent Vitals/Pain Pain Assessment Pain Assessment: No/denies pain    Home Living  Family/patient expects to be discharged to:: Private residence Living Arrangements: Alone Available Help at Discharge: Family;Available PRN/intermittently Type of Home: House Home Access: Stairs to enter   [employer] of Steps: 2 in front, 14 in back to lake Alternate Level Stairs-Number of Steps: flight Home Layout: Two level;Bed/bath upstairs Home Equipment: None      Prior Function Prior Level of Function : Independent/Modified Independent             Mobility Comments: works owns business, travels, recently separated from his wife       Extremity/Trunk Assessment   Upper Extremity Assessment Upper Extremity Assessment: Overall WFL for tasks assessed    Lower Extremity Assessment Lower Extremity Assessment: Overall WFL for tasks assessed       Communication   Communication Communication: No apparent difficulties    Cognition Arousal: Alert Behavior During Therapy: WFL for tasks assessed/performed   PT - Cognitive impairments: No apparent impairments                         Following commands: Intact       Cueing       General Comments General comments (skin integrity, edema, etc.): educated in  safety with sturdy shoes, lighting, clear pathways, non-slip surface in shower, sturdy rails on steps, etc.  SpO2 throughout flat level ambulation 200' 91-94% on RA, after stair negotiation 86% up to 88% during ambulation with cues for pursed lip breathing.  Back to 91% at rest on RA    Exercises     Assessment/Plan    PT Assessment Patient does not need any further PT services  PT Problem List         PT Treatment Interventions      PT Goals (Current goals can be found in the Care Plan section)  Acute Rehab PT Goals PT Goal Formulation: All assessment and education complete, DC therapy    Frequency       Co-evaluation               AM-PAC PT "6 Clicks" Mobility  Outcome Measure Help needed turning from your back to your  side while in a flat bed without using bedrails?: None Help needed moving from lying on your back to sitting on the side of a flat bed without using bedrails?: None Help needed moving to and from a bed to a chair (including a wheelchair)?: None Help needed standing up from a chair using your arms (e.g., wheelchair or bedside chair)?: None Help needed to walk in hospital room?: None Help needed climbing 3-5 steps with a railing? : None 6 Click Score: 24    End of Session   Activity Tolerance: Patient tolerated treatment well Patient left: in bed   PT Visit Diagnosis: Difficulty in walking, not elsewhere classified (R26.2)    Time: 0940-1000 PT Time Calculation (min) (ACUTE ONLY): 20 min   Charges:   PT Evaluation $PT Eval Low Complexity: 1 Low   PT General Charges $$ ACUTE PT VISIT: 1 Visit         Abigail Hoff, PT Acute Rehabilitation Services [phone] 01/21/2024   [name] 01/21/2024, 11:59 AM

## 2024-01-21 NOTE — Hospital Course (Addendum)
 Chad Lyons was admitted to the hospital with the working diagnosis of heart failure decompensation.   58 y.o. male with past medical history of paroxysmal atrial fibrillation, s/p cardioversion, chronic diastolic CHF, GERD, obstructive sleep apnea and  anxiety, presented to hospital with lower extremity edema, dyspnea and orthopnea. Recent hospitalization 04/10 to 11/27/23 , he had uncontrolled atrial fibrillation, at the time of his discharge, his furosemide  dose was decreased from 120 mg daily to 40 mg as needed, stooped entresto , SGLT 2 inh and spironolactone . Since then he noted worsening edema.  On his initial physical examination his blood pressure was 127/103, HR 62, RR 22 and 02 saturation 94%, Lungs with no wheezing or rhonchi, heart with S1 and S2 present and regular, abdomen with no distention and positive lower extremity edema.   Na 139, K 3,1 Cl 100 bicarbonate 26 glucose 93 bun 13 cr 1,17  BNP 1,170  High sensitive troponin 47 and 46  Wbc 7.7 hgb 12.1 plt 248   Chest radiograph with cardiomegaly, bilateral hilar vascular congestion, bilateral central interstitial infiltrates, fluid in the right fissure and bilateral small pleural effusions.   EKG 57 bpm, left axis deviation, qtc 441, left bundle branch block, sinus rhythm with no significant ST segment or T wave changes.   Patient was placed on IV furosemide  for diuresis.   06/12 improved volume status, patient will continue oral diuretic therapy at home.  Follow up with outpatient heart failure clinic.

## 2024-01-21 NOTE — Progress Notes (Signed)
 Heart Failure Navigator Progress Note  Assessed for Heart & Vascular TOC clinic readiness.  Patient does not meet criteria due to Advanced Heart Failure team patient of Dr. Gala Romney.   Navigator will sign off at this time.    Rhae Hammock, BSN, Scientist, clinical (histocompatibility and immunogenetics) Only

## 2024-01-21 NOTE — Progress Notes (Signed)
 PROGRESS NOTE  Chad Lyons:[id number] DOB: February 28, 1966 DOA: 01/20/2024 PCP: [place] At[place], NP   LOS: 1 day   Brief narrative:   Chad Lyons  is a 58 y.o. male with past medical history of paroxysmal atrial fibrillation, s/p cardioversion, on amiodarone  and Eliquis , chronic diastolic CHF, GERD, obstructive sleep apnea uses CPAP at night, anxiety, presented to hospital with lower extremity swelling and shortness of breath orthopnea and exertional dyspnea.  Of note he was on Lasix  120 mg in the past and had been recently decreased to 40 mg.  Patient also reported dry cough.  In the ED patient had 2+ edema with crackles and elevated BNP.  Patient was then considered for admission to hospital for acute decompensated heart failure.     Assessment/Plan: Principal Problem:   Acute on chronic diastolic CHF (congestive heart failure) (HCC) Active Problems:   OBESITY, MORBID   Essential hypertension   Hypertrophic obstructive cardiomyopathy (HCC)   Acute hypoxemic respiratory failure (HCC)   Acute on chronic diastolic CHF Likely secondary to changes in his diuretic regimen.  Had 20 to 25 pounds of volume overload.  Review of recent 2D echocardiogram from 11/27/2023 with LV EF 55% continue telemetry monitoring, IV Lasix  80 mg given in the ER, continue supplemental oxygen , fluid restriction, continue Coreg , start the patient on Lasix  40 mg IV twice daily.  Not repeating 2D echocardiogram since patient had recently.  Hemoglobin A1c of 4.6.  Mild hypokalemia.  Potassium of 3.2.  Will replenish.  Check levels in AM.    Paroxysmal atrial fibrillation.  History of cardioversion in the past.  Currently on Coreg  Eliquis  and amiodarone .  Will continue.  Rate controlled at this time.    Obesity with OSA.  There is no height or weight on file to calculate BMI.  Patient would benefit from lifestyle modifications.  Continue CPAP   GERD.  Continue PPI.   Anxiety.  Continue home dose benzodiazepine.   DVT  prophylaxis:  apixaban  (ELIQUIS ) tablet 5 mg   Disposition: Home likely in 1 to 2 days  Status is: Inpatient Remains inpatient appropriate because: Decompensated heart failure, IV diuretics, gross peripheral edema.    Code Status:     Code Status: Full Code  Family Communication: None at bedside  Consultants: None  Procedures: None  Anti-infectives:  None  Anti-infectives (From admission, onward)    None       Subjective: Today, patient was seen and examined at bedside.  Complains of proximal lower extremity swelling.  Inquiring about Lasix .  Breathing slightly better.  Was able to sleep in recliner.  Still with shortness of breath and dyspnea on exertion.  Objective: Vitals:   01/21/24 0830 01/21/24 0900  BP: 133/81 138/84  Pulse: 66 63  Resp: (!) 23 20  Temp:    SpO2: 96% 96%    Intake/Output Summary (Last 24 hours) at 01/21/2024 1131 Last data filed at 01/20/2024 2300 Gross per 24 hour  Intake --  Output 2550 ml  Net -2550 ml   There were no vitals filed for this visit. There is no height or weight on file to calculate BMI.   Physical Exam: GENERAL: Patient is alert awake and oriented. Not in obvious distress.  Obese built, on room air HENT: No scleral pallor or icterus. Pupils equally reactive to light. Oral mucosa is moist NECK: is supple, no gross swelling noted. CHEST: Crackles over the bases.  Decreased breath sounds bilaterally. CVS: S1 and S2 heard, no murmur. Regular rate  and rhythm.  ABDOMEN: Soft, non-tender, bowel sounds are present. EXTREMITIES: Significant pitting edema of the bilateral lower extremities. CNS: Cranial nerves are intact. No focal motor deficits. SKIN: warm and dry without rashes.  Data Review: I have personally reviewed the following laboratory data and studies,  CBC: Recent Labs  Lab 01/20/24 1258 01/21/24 0357  WBC 7.7 6.5  NEUTROABS  --  4.1  HGB 12.1* 11.8*  HCT 37.9* 36.0*  MCV 100.0 102.0*  PLT 248 202    Basic Metabolic Panel: Recent Labs  Lab 01/20/24 1258 01/21/24 0357  NA 139 140  K 3.1* 3.2*  CL 100 104  CO2 26 26  GLUCOSE 93 91  BUN 13 12  CREATININE 1.17 1.16  CALCIUM 9.0 8.7*  MG  --  1.9   Liver Function Tests: No results for input(s): "AST", "ALT", "ALKPHOS", "BILITOT", "PROT", "ALBUMIN" in the last 168 hours. No results for input(s): "LIPASE", "AMYLASE" in the last 168 hours. No results for input(s): "AMMONIA" in the last 168 hours. Cardiac Enzymes: No results for input(s): "CKTOTAL", "CKMB", "CKMBINDEX", "TROPONINI" in the last 168 hours. BNP (last 3 results) Recent Labs    12/04/23 1215 01/20/24 1306 01/21/24 0357  BNP 183.7* 1,170.1* 670.5*    ProBNP (last 3 results) No results for input(s): "PROBNP" in the last 8760 hours.  CBG: No results for input(s): "GLUCAP" in the last 168 hours. No results found for this or any previous visit (from the past 240 hours).   Studies: DG Chest 2 View Result Date: 01/20/2024 CLINICAL DATA:  Shortness of breath and some chest tightness. Bilateral lower extremity edema. 20 lb weight gain over the past 2 weeks. Upper respiratory tract infection with cough and congestion. EXAM: CHEST - 2 VIEW COMPARISON:  11/26/2023 FINDINGS: Enlarged cardiac silhouette with an increase in size. Mild increase in prominence of the pulmonary vasculature and interstitial markings. Interval small bilateral pleural effusions. Thoracolumbar spine degenerative changes. IMPRESSION: Interval development of mild congestive heart failure. Electronically Signed   By: Catherin Closs M.D.   On: 01/20/2024 13:54      Rosena Conradi, MD  [facility] 01/21/2024  If 7PM-7AM, please contact night-coverage

## 2024-01-22 DIAGNOSIS — I5043 Acute on chronic combined systolic (congestive) and diastolic (congestive) heart failure: Secondary | ICD-10-CM

## 2024-01-22 DIAGNOSIS — I5033 Acute on chronic diastolic (congestive) heart failure: Secondary | ICD-10-CM | POA: Diagnosis not present

## 2024-01-22 LAB — CBC WITH DIFFERENTIAL/PLATELET
Abs Immature Granulocytes: 0.04 10*3/uL (ref 0.00–0.07)
Basophils Absolute: 0.1 10*3/uL (ref 0.0–0.1)
Basophils Relative: 1 %
Eosinophils Absolute: 0.2 10*3/uL (ref 0.0–0.5)
Eosinophils Relative: 3 %
HCT: 36 % — ABNORMAL LOW (ref 39.0–52.0)
Hemoglobin: 11.5 g/dL — ABNORMAL LOW (ref 13.0–17.0)
Immature Granulocytes: 1 %
Lymphocytes Relative: 17 %
Lymphs Abs: 1.1 10*3/uL (ref 0.7–4.0)
MCH: 31.6 pg (ref 26.0–34.0)
MCHC: 31.9 g/dL (ref 30.0–36.0)
MCV: 98.9 fL (ref 80.0–100.0)
Monocytes Absolute: 0.9 10*3/uL (ref 0.1–1.0)
Monocytes Relative: 14 %
Neutro Abs: 4.3 10*3/uL (ref 1.7–7.7)
Neutrophils Relative %: 64 %
Platelets: 213 10*3/uL (ref 150–400)
RBC: 3.64 MIL/uL — ABNORMAL LOW (ref 4.22–5.81)
RDW: 12.9 % (ref 11.5–15.5)
WBC: 6.6 10*3/uL (ref 4.0–10.5)
nRBC: 0 % (ref 0.0–0.2)

## 2024-01-22 LAB — BASIC METABOLIC PANEL WITH GFR
Anion gap: 7 (ref 5–15)
BUN: 13 mg/dL (ref 6–20)
CO2: 28 mmol/L (ref 22–32)
Calcium: 8.7 mg/dL — ABNORMAL LOW (ref 8.9–10.3)
Chloride: 105 mmol/L (ref 98–111)
Creatinine, Ser: 1.34 mg/dL — ABNORMAL HIGH (ref 0.61–1.24)
GFR, Estimated: >60 mL/min (ref >60)
Glucose, Bld: 105 mg/dL — ABNORMAL HIGH (ref 70–99)
Potassium: 3.5 mmol/L (ref 3.5–5.1)
Sodium: 140 mmol/L (ref 135–145)

## 2024-01-22 LAB — MAGNESIUM: Magnesium: 1.9 mg/dL (ref 1.7–2.4)

## 2024-01-22 MED ORDER — FLUTICASONE PROPIONATE 50 MCG/ACT NA SUSP
2.0000 | Freq: Every day | NASAL | Status: DC
  Administered 2024-01-22 – 2024-01-27 (×5): 2 via NASAL
  Filled 2024-01-22: qty 16

## 2024-01-22 MED ORDER — POTASSIUM CHLORIDE CRYS ER 20 MEQ PO TBCR
40.0000 meq | EXTENDED_RELEASE_TABLET | Freq: Every day | ORAL | Status: DC
  Administered 2024-01-22 – 2024-01-23 (×2): 40 meq via ORAL
  Filled 2024-01-22 (×2): qty 2

## 2024-01-22 NOTE — Consult Note (Signed)
 Cardiology Consultation   Patient ID: KY RUMPLE MRN: [id number]; DOB: 01/16/1966  Admit date: 01/20/2024 Date of Consult: 01/22/2024  PCP:  Alto Atta, NP   [facility] Providers Cardiologist:  Avery Bodo, MD        Patient Profile: Chad Lyons is a 58 y.o. male with a hx of HFpEF, hypertrophic cardiomyopathy, atrial fibrillation, hypertension, diverticulitis, s/p lab band, tobacco use, OSA on CPAP who is being seen 01/22/2024 for the evaluation of acute on chronic CHF at the request of Dr. Efrain Grant.  History of Present Illness: Chad Lyons has longstanding cardiac history. He was previously admitted in 2023 with symptoms of acute CHF. Echo at that time showed LVEF ~40-50%. cMRI showed LVEF 35% with moderate asymmetric septal hypertrophy but no LVOT. Findings consistent with hypertrophic cardiomyopathy. Patient discharged on GDMT including Lasix , Coreg , Entresto , Spironolactone . Patient admitted more recently in April 2025 with symptomatic atrial fibrillation with RVR. Due to hypotension in the ED, he underwent emergent DCCV in the ED and started Amiodarone . Of note, just prior to this admission at [facility], patient also seen for same presentation [place]. At d/c, GDMT limited by BP and renal function. Lasix  was to be taken PRN. In clinic follow up, patient much improved, remained in sinus rhythm. Patient saw a cardiologist in [facility]) on 5/13. Per patient, this provider advised decreasing Amiodarone  dosing as well as recommended ILR. Patient declined both of these recommendations without agreement from his [facility] team.  Patient admitted on 6/4 after he presented with progressive weight gain, lower extremity edema, orthopnea, exertional dyspnea. Labs found BNP 1170.1, troponin 47->46. On exam, patient reports seeing his PCP 5/23 with symptoms felt to be consistent with sinusitis. He was started on Doxycycline  but noticed no significant change in  symptoms. Patient traveled to [place] last week and noticed that he developed significant LE edema. As a result, he resumed taking higher daily dose of Lasix  (80mg  AM, 40mg  PM) but didn't note an appreciable change in edema. He then began to notice periods of dyspnea and orthopnea. Ultimately he felt symptoms were similar to previous CHF exacerbations and he called HF clinic who advised presentation to the ED. Patient denies significant changes to diet or salt intake. When asked about urine output with PO lasix , he reports that it "didn't feel like it was enough given the amount of lasix  I was taking." He denies any chest pain. Denies symptoms of recurrent afib.   Past Medical History:  Diagnosis Date   Diverticulitis    HX OF   EKG, abnormal    HX OF LEFT ANTERIO FASCICULAR BLOCK ON 02-15-15 EKG   GERD (gastroesophageal reflux disease)    Heart murmur    NOW RESOLVED   Hypertension    OSA (obstructive sleep apnea) 11/27/2016   Ventral incisional hernia     Past Surgical History:  Procedure Laterality Date   CARDIOVERSION N/A 10/03/2021   Procedure: CARDIOVERSION;  Surgeon: Mardell Shade, MD;  Location: [facility] ENDOSCOPY;  Service: Cardiovascular;  Laterality: N/A;   COLON SURGERY  2006   colon resection for polyps   COLONOSCOPY WITH PROPOFOL  N/A 10/31/2016   Procedure: COLONOSCOPY WITH PROPOFOL ;  Surgeon: Sergio Dandy, MD;  Location: [facility] ENDOSCOPY;  Service: Endoscopy;  Laterality: N/A;   HIATAL HERNIA REPAIR  1991   INSERTION OF MESH N/A 02/20/2015   Procedure: INSERTION OF MESH;  Surgeon: Ayesha Lente, MD;  Location: [facility] ORS;  Service: General;  Laterality: N/A;   LAPAROSCOPIC GASTRIC BANDING  2011   VENTRAL HERNIA REPAIR N/A 02/20/2015   Procedure: OPEN REPAIR VENTRAL INCISIONAL HERNIA;  Surgeon: Ayesha Lente, MD;  Location: [facility] ORS;  Service: General;  Laterality: N/A;   WISDOM TOOTH EXTRACTION       Home Medications:  Prior to Admission medications   Medication Sig  Start Date End Date Taking? Authorizing Provider  acetaminophen  (TYLENOL ) 500 MG tablet Take 500 mg by mouth as needed for mild pain (pain score 1-3) or moderate pain (pain score 4-6).   Yes [provider]  ALPRAZolam  (XANAX ) 0.5 MG tablet Take 0.5 mg by mouth daily as needed for anxiety.   Yes [provider]  amiodarone  (PACERONE ) 200 MG tablet Take 2 tablets (400 mg total) by mouth daily. Patient taking differently: Take 400 mg by mouth in the morning. 11/27/23  Yes Ruddy Corral M, PA-C  apixaban  (ELIQUIS ) 5 MG TABS tablet Take 1 tablet (5 mg total) by mouth 2 (two) times daily. 04/06/23  Yes Milford, Arlice Bene, FNP  B Complex Vitamins (B COMPLEX PO) Take 1 each by mouth in the morning. OTC B-Complex gummy supplement.   Yes [provider]  benzonatate  (TESSALON ) 200 MG capsule Take 1 capsule (200 mg total) by mouth 3 (three) times daily as needed. 01/08/24  Yes Nafziger, Randel Buss, NP  Cholecalciferol (VITAMIN D -3 PO) Take 1 each by mouth daily. OTC Vitamin D3 gummy supplement   Yes [provider]  Famotidine  (PEPCID  PO) Take 2 tablets by mouth as needed.   Yes [provider]  furosemide  (LASIX ) 40 MG tablet Take 1 tablet (40 mg total) by mouth as needed. Patient taking differently: Take 40-80 mg by mouth as needed for fluid. 80mg  qam, 40mg  qpm 11/27/23  Yes Saralee Cummins, Brittainy M, PA-C  meloxicam (MOBIC) 15 MG tablet Take 15 mg by mouth daily as needed for pain.   Yes [provider]  Multiple Vitamins-Minerals (MENS MULTIVITAMIN GUMMIES) CHEW Chew 1 each by mouth daily.   Yes [provider]  Omeprazole Magnesium (PRILOSEC OTC PO) Take 1 tablet by mouth as needed.   Yes [provider]  OVER THE COUNTER MEDICATION Take 1 each by mouth in the morning and at bedtime. OTC Keto + ACV gummy supplement   Yes [provider]  potassium chloride  SA (KLOR-CON  M) 20 MEQ tablet Take 20 mEq by mouth 2 (two) times daily.   Yes  [provider]  triamcinolone  cream (KENALOG ) 0.1 % APPLY TOPICALLY 2 TIMES DAILY AS NEEDED (RASH). 10/01/23  Yes Nafziger, Randel Buss, NP    Scheduled Meds:  amiodarone   400 mg Oral Daily   apixaban   5 mg Oral BID   carvedilol   3.125 mg Oral BID WC   furosemide   40 mg Intravenous Q12H   potassium chloride   40 mEq Oral Daily   Continuous Infusions:  PRN Meds: acetaminophen  **OR** acetaminophen , ALPRAZolam , bisacodyl, ondansetron  **OR** ondansetron  (ZOFRAN ) IV  Allergies:    Allergies  Allergen Reactions   Hydromet [Hydrocodone  Bit-Homatrop Mbr] Itching   Omnipaque  [Iohexol ] Hives and Itching    Itching and hives develop several hours after injection   Iodine-131 Itching   Penicillins Itching   Red Dye #40 (Allura Red) Itching    Social History:   Social History   Socioeconomic History   Marital status: Married    Spouse name: Alfonzia Woolum   Number of children: 1   Years of education: Not on file   Highest education level:  Master's degree (e.g., MA, MS, MEng, MEd, MSW, MBA)  Occupational History   Occupation: Sales  Tobacco Use   Smoking status: Former    Current packs/day: 0.50    Types: Cigarettes   Smokeless tobacco: Never   Tobacco comments:    Current Cigars 1 every other day -----QUIT cigarettes in 2006>> 1/2PPD x 15 years  Vaping Use   Vaping status: Never Used  Substance and Sexual Activity   Alcohol use: Yes    Alcohol/week: 40.0 standard drinks of alcohol    Types: 40 Shots of liquor per week    Comment: 6-7 shots of liquor daily x 8 years. heavier on weekend   Drug use: No   Sexual activity: Not on file    Comment: still smokes cigars  Other Topics Concern   Not on file  Social History Narrative   Not on file   Social Drivers of Health   Financial Resource Strain: Low Risk  (01/07/2024)   Overall Financial Resource Strain (CARDIA)    Difficulty of Paying Living Expenses: Not hard at all  Food Insecurity: No Food Insecurity (01/21/2024)    Hunger Vital Sign    Worried About Running Out of Food in the Last Year: Never true    Ran Out of Food in the Last Year: Never true  Transportation Needs: No Transportation Needs (01/21/2024)   PRAPARE - Administrator, Civil Service (Medical): No    Lack of Transportation (Non-Medical): No  Physical Activity: Insufficiently Active (01/07/2024)   Exercise Vital Sign    Days of Exercise per Week: 5 days    Minutes of Exercise per Session: 10 min  Stress: No Stress Concern Present (01/07/2024)   [employer] of Occupational Health - Occupational Stress Questionnaire    Feeling of Stress : Only a little  Social Connections: Moderately Integrated (01/07/2024)   Social Connection and Isolation Panel [NHANES]    Frequency of Communication with Friends and Family: More than three times a week    Frequency of Social Gatherings with Friends and Family: More than three times a week    Attends Religious Services: More than 4 times per year    Active Member of [employer] or Organizations: Yes    Attends Banker Meetings: More than 4 times per year    Marital Status: Separated  Intimate Partner Violence: Not At Risk (01/21/2024)   Humiliation, Afraid, Rape, and Kick questionnaire    Fear of Current or Ex-Partner: No    Emotionally Abused: No    Physically Abused: No    Sexually Abused: No    Family History:    Family History  Problem Relation Age of Onset   Heart disease Mother    High blood pressure Mother    Diabetes Mother    Hypertension Brother    Heart failure Brother    Stroke Brother    Heart disease Maternal Aunt    Cancer Maternal Uncle        pancreatic   Heart disease Maternal Uncle    Rheum arthritis Maternal Grandmother    High blood pressure Maternal Grandmother    Colon cancer Neg Hx      ROS:  Please see the history of present illness.   All other ROS reviewed and negative.     Physical Exam/Data: Vitals:   01/22/24 0016 01/22/24 0358  01/22/24 0713 01/22/24 1042  BP: (!) 137/94 (!) 146/87 (!) 158/92 (!) 157/95  Pulse: (!) 53 (!) 53 [name])  59 (!) 56  Resp: 19 18 20 20   Temp: 98 F (36.7 C) 97.6 F (36.4 C) 97.8 F (36.6 C) 98 F (36.7 C)  TempSrc: Oral Oral Oral Oral  SpO2: 94% 96% 94% 93%  Weight:  133 kg    Height:        Intake/Output Summary (Last 24 hours) at 01/22/2024 1320 Last data filed at 01/22/2024 1301 Gross per 24 hour  Intake 420 ml  Output 450 ml  Net -30 ml      01/22/2024    3:58 AM 01/21/2024    7:58 PM 01/19/2024    4:49 PM  Last 3 Weights  Weight (lbs) 293 lb 3.4 oz 293 lb 6.4 oz 287 lb  Weight (kg) 133 kg 133.085 kg 130.182 kg     Body mass index is 37.65 kg/m.  General:  Well nourished, well developed, in no acute distress HEENT: normal Neck: no JVD Vascular: No carotid bruits; Distal pulses 2+ bilaterally Cardiac:  normal S1, S2; RRR; no murmur  Lungs:  clear to auscultation bilaterally, no wheezing, rhonchi or rales  Abd: soft, nontender, no hepatomegaly  Ext: 3+ pitting edema bilaterally to knee. Mixed edema noted above knee to near waistline Musculoskeletal:  No deformities, BUE and BLE strength normal and equal Skin: warm and dry  Neuro:  CNs 2-12 intact, no focal abnormalities noted Psych:  Normal affect   EKG:  The EKG was personally reviewed and demonstrates:  sinus bradycardia with IVCD Telemetry:  Telemetry was personally reviewed and demonstrates:  sinus bradycardia  Relevant CV Studies: IMPRESSIONS     1. Left ventricular ejection fraction, by estimation, is 50 to 55%. The  left ventricle has low normal function. The left ventricle has no regional  wall motion abnormalities. There is severe concentric left ventricular  hypertrophy consistent with  hypertrophic cardiomyopathy. Left ventricular diastolic parameters are  consistent with Grade I diastolic dysfunction (impaired relaxation).   2. Right ventricular systolic function is low normal. The right  ventricular size  is mildly enlarged.   3. The mitral valve is normal in structure. Trivial mitral valve  regurgitation. No evidence of mitral stenosis.   4. The aortic valve is normal in structure. Aortic valve regurgitation is  mild. No aortic stenosis is present.   5. The inferior vena cava is normal in size with greater than 50%  respiratory variability, suggesting right atrial pressure of 3 mmHg.   FINDINGS   Left Ventricle: Left ventricular ejection fraction, by estimation, is 50  to 55%. The left ventricle has low normal function. The left ventricle has  no regional wall motion abnormalities. The left ventricular internal  cavity size was normal in size.  There is severe concentric left ventricular hypertrophy. Left ventricular  diastolic parameters are consistent with Grade I diastolic dysfunction  (impaired relaxation).   Right Ventricle: The right ventricular size is mildly enlarged. No  increase in right ventricular wall thickness. Right ventricular systolic  function is low normal.   Left Atrium: Left atrial size was normal in size.   Right Atrium: Right atrial size was normal in size.   Pericardium: There is no evidence of pericardial effusion.   Mitral Valve: The mitral valve is normal in structure. Trivial mitral  valve regurgitation. No evidence of mitral valve stenosis.   Tricuspid Valve: The tricuspid valve is normal in structure. Tricuspid  valve regurgitation is not demonstrated. No evidence of tricuspid  stenosis.   Aortic Valve: The aortic valve is normal in  structure. Aortic valve  regurgitation is mild. No aortic stenosis is present. Aortic valve peak  gradient measures 6.0 mmHg.   Pulmonic Valve: The pulmonic valve was normal in structure. Pulmonic valve  regurgitation is not visualized. No evidence of pulmonic stenosis.   Aorta: The aortic root is normal in size and structure.   Venous: The inferior vena cava is normal in size with greater than 50%  respiratory  variability, suggesting right atrial pressure of 3 mmHg.   IAS/Shunts: No atrial level shunt detected by color flow Doppler.   Laboratory Data: High Sensitivity Troponin:   Recent Labs  Lab 01/20/24 1258 01/20/24 1555  TROPONINIHS 47* 46*     Chemistry Recent Labs  Lab 01/20/24 1258 01/21/24 0357 01/22/24 0247  NA 139 140 140  K 3.1* 3.2* 3.5  CL 100 104 105  CO2 26 26 28   GLUCOSE 93 91 105*  BUN 13 12 13   CREATININE 1.17 1.16 1.34*  CALCIUM 9.0 8.7* 8.7*  MG  --  1.9 1.9  GFRNONAA >60 >60 >60  ANIONGAP 13 10 7     No results for input(s): "PROT", "ALBUMIN", "AST", "ALT", "ALKPHOS", "BILITOT" in the last 168 hours. Lipids No results for input(s): "CHOL", "TRIG", "HDL", "LABVLDL", "LDLCALC", "CHOLHDL" in the last 168 hours.  Hematology Recent Labs  Lab 01/20/24 1258 01/21/24 0357 01/22/24 0247  WBC 7.7 6.5 6.6  RBC 3.79* 3.53* 3.64*  HGB 12.1* 11.8* 11.5*  HCT 37.9* 36.0* 36.0*  MCV 100.0 102.0* 98.9  MCH 31.9 33.4 31.6  MCHC 31.9 32.8 31.9  RDW 13.2 13.4 12.9  PLT 248 202 213   Thyroid   Recent Labs  Lab 01/20/24 1554  TSH 1.488    BNP Recent Labs  Lab 01/20/24 1306 01/21/24 0357  BNP 1,170.1* 670.5*    DDimer No results for input(s): "DDIMER" in the last 168 hours.  Radiology/Studies:  DG Chest 2 View Result Date: 01/20/2024 CLINICAL DATA:  Shortness of breath and some chest tightness. Bilateral lower extremity edema. 20 lb weight gain over the past 2 weeks. Upper respiratory tract infection with cough and congestion. EXAM: CHEST - 2 VIEW COMPARISON:  11/26/2023 FINDINGS: Enlarged cardiac silhouette with an increase in size. Mild increase in prominence of the pulmonary vasculature and interstitial markings. Interval small bilateral pleural effusions. Thoracolumbar spine degenerative changes. IMPRESSION: Interval development of mild congestive heart failure. Electronically Signed   By: Catherin Closs M.D.   On: 01/20/2024 13:54     Assessment and  Plan:  Acute on chronic HFmrEF Hypertrophic cardiomyopathy Hypertension Patient with 11/27/23 TTE showing LVEF 50-55%, low normal RV function, severe LVH, grade I DD admitted with exertional dyspnea, weight gain and LE edema, orthopnea. Labs, imaging, and exam consistent with CHF exacerbation. BNP 1170.1, troponin 47->46. Of note, weight and volume accumulation seemed to occur after patient's lasix  was decreased to PRN dosing. He initially received IV lasix  80mg  in the ED, has since been getting 40mg  IV BID. Patient now net negative 2.8L. I/O do not appear accurate with patient reporting at least 5L output. Cardiology asked to see patient since he has previously been followed by CHF team.  Suspect exacerbation is largely secondary to lack of GDMT since April. Continue IV Lasix  40mg  BID given excellent urine output. BP much improved this admission, would ideally resume previously discontinued GDMT including Entresto , MRA, beta blocker, SGLT2. Prior to discontinuation of GDMT this past April, patient had very stable volume status/CHF symptoms on appropriate therapy.  Paroxsymal atrial fibrillation Patient seen  both at OSH and [facility] earlier this year with afib/RVR. Is s/p DCCV at [facility] this past April and remains in NSR. Continue Amiodarone  200mg  daily. Would not continue longterm given patient's age but continued therapy appropriate in setting of CHF exacerbation.  Continue Eliquis  5mg  BID Ideally would see EP in outpatient setting to discuss ablation.  Dilated aortic root Noted 4.5cm dilation on 2023 echo. CTA with similar diameter 43mm. However, latest echo from April 2025 reported normal aortic root size. Continue serial imaging to monitor  Risk Assessment/Risk Scores:       [place]  Heart Association (NYHA) Functional Class NYHA Class III  CHA2DS2-VASc Score = 2   This indicates a 2.2% annual risk of stroke. The patient's score is based upon: CHF History: 1 HTN History: 1 Diabetes History:  0 Stroke History: 0 Vascular Disease History: 0 Age Score: 0 Gender Score: 0        For questions or updates, please contact [facility] Please consult [email or web address] for contact info under    Signed, Leala Prince, PA-C  01/22/2024 1:20 PM

## 2024-01-22 NOTE — Plan of Care (Signed)

## 2024-01-22 NOTE — Care Management (Signed)
  Transition of Care [facility]) Screening Note   Patient Details  Name: Chad Lyons Date of Birth: [birth year]   Transition of Care [facility]) CM/SW Contact:    Ronni Colace, RN Phone Number: 01/22/2024, 8:56 AM    Transition of Care Department [facility]) has reviewed patient and no TOC needs have been identified at this time. We will continue to monitor patient advancement through interdisciplinary progression rounds. If new patient transition needs arise, please place a TOC consult.

## 2024-01-22 NOTE — Progress Notes (Signed)
 PROGRESS NOTE  Chad Lyons ZOX:[id number] DOB: [birth year] DOA: 01/20/2024 PCP: Alto Atta, NP   LOS: 2 days   Brief narrative:   Chad Lyons  is a 58 y.o. male with past medical history of paroxysmal atrial fibrillation, s/p cardioversion, on amiodarone  and Eliquis , chronic diastolic CHF, GERD, obstructive sleep apnea uses CPAP at night, anxiety, presented to hospital with lower extremity swelling and shortness of breath orthopnea and exertional dyspnea.  Of note he was on Lasix  120 mg in the past and had been recently decreased to 40 mg.  Patient also reported dry cough.  In the ED,patient had significant edema with crackles and elevated BNP.  Patient was then considered for admission to hospital for acute decompensated heart failure.     Assessment/Plan: Principal Problem:   Acute on chronic diastolic CHF (congestive heart failure) (HCC) Active Problems:   OBESITY, MORBID   Essential hypertension   Hypertrophic obstructive cardiomyopathy (HCC)   Acute hypoxemic respiratory failure (HCC)   Acute on chronic diastolic CHF History of hypertrophic cardiomyopathy. Likely secondary to changes in his diuretic regimen.  Had 20 to 25 pounds of volume overload as per the patient.  Review of recent 2D echocardiogram from 11/27/2023 with LV EF 55% continue telemetry monitoring. IV Lasix  80 mg given in the ER, will continue with 40 IV twice daily.  Continue  fluid restriction,Coreg .   Not repeating 2D echocardiogram since patient had recently.  Hemoglobin A1c of 4.6. will get CHF team involved due to history of HCOM and being followed by advanced CHF team in the past.  Will need optimization.  Patient is negative balance for 2 to 3 mL of blood.  Mild hypokalemia.  Better but will continue to replenish orally.  Check levels in AM.    Paroxysmal atrial fibrillation.  History of cardioversion in the past.  History of DC cardioversion in the past.  Currently on Coreg  Eliquis  and amiodarone .  Will  continue.  Rate controlled at this time.    Obesity with OSA.  There is no height or weight on file to calculate BMI.  Patient would benefit from lifestyle modifications.  Continue CPAP   GERD.  Continue PPI.   Anxiety.  Continue home dose benzodiazepine.   DVT prophylaxis:  apixaban  (ELIQUIS ) tablet 5 mg   Disposition: Home likely in 1 to 2 days when optimized.  Status is: Inpatient Remains inpatient appropriate because: Decompensated heart failure, IV diuretics, gross peripheral edema, cardiology evaluation.    Code Status:     Code Status: Full Code  Family Communication: None at bedside  Consultants: Cardiology  Procedures: None  Anti-infectives:  None  Anti-infectives (From admission, onward)    None       Subjective: Today, patient was seen and examined at bedside.  Patient stated that his breathing is little better today but he still has significant lower extremity edema.  Denies any chest pain, dizziness lightheadedness.   Objective: Vitals:   01/22/24 0713 01/22/24 1042  BP: (!) 158/92 (!) 157/95  Pulse: (!) 59 (!) 56  Resp: 20 20  Temp: 97.8 F (36.6 C) 98 F (36.7 C)  SpO2: 94% 93%    Intake/Output Summary (Last 24 hours) at 01/22/2024 1242 Last data filed at 01/22/2024 0820 Gross per 24 hour  Intake 180 ml  Output 450 ml  Net -270 ml   Filed Weights   01/21/24 1958 01/22/24 0358  Weight: 133.1 kg 133 kg   Body mass index is 37.65 kg/m.   Physical  Exam:  GENERAL: Patient is alert awake and oriented. Not in obvious distress.  Obese built, on room air HENT: No scleral pallor or icterus. Pupils equally reactive to light. Oral mucosa is moist NECK: is supple, no gross swelling noted.  Distended neck veins. CHEST: .  Decreased breath sounds bilaterally.  Coarse breath sounds noted. CVS: S1 and S2 heard, no murmur. Regular rate and rhythm.  ABDOMEN: Soft, non-tender, bowel sounds are present. EXTREMITIES: Significant pitting edema of the  bilateral lower extremities. CNS: Cranial nerves are intact. No focal motor deficits. SKIN: warm and dry without rashes.  Data Review: I have personally reviewed the following laboratory data and studies,  CBC: Recent Labs  Lab 01/20/24 1258 01/21/24 0357 01/22/24 0247  WBC 7.7 6.5 6.6  NEUTROABS  --  4.1 4.3  HGB 12.1* 11.8* 11.5*  HCT 37.9* 36.0* 36.0*  MCV 100.0 102.0* 98.9  PLT 248 202 213   Basic Metabolic Panel: Recent Labs  Lab 01/20/24 1258 01/21/24 0357 01/22/24 0247  NA 139 140 140  K 3.1* 3.2* 3.5  CL 100 104 105  CO2 26 26 28   GLUCOSE 93 91 105*  BUN 13 12 13   CREATININE 1.17 1.16 1.34*  CALCIUM 9.0 8.7* 8.7*  MG  --  1.9 1.9   Liver Function Tests: No results for input(s): "AST", "ALT", "ALKPHOS", "BILITOT", "PROT", "ALBUMIN" in the last 168 hours. No results for input(s): "LIPASE", "AMYLASE" in the last 168 hours. No results for input(s): "AMMONIA" in the last 168 hours. Cardiac Enzymes: No results for input(s): "CKTOTAL", "CKMB", "CKMBINDEX", "TROPONINI" in the last 168 hours. BNP (last 3 results) Recent Labs    12/04/23 1215 01/20/24 1306 01/21/24 0357  BNP 183.7* 1,170.1* 670.5*    ProBNP (last 3 results) No results for input(s): "PROBNP" in the last 8760 hours.  CBG: No results for input(s): "GLUCAP" in the last 168 hours. No results found for this or any previous visit (from the past 240 hours).   Studies: DG Chest 2 View Result Date: 01/20/2024 CLINICAL DATA:  Shortness of breath and some chest tightness. Bilateral lower extremity edema. 20 lb weight gain over the past 2 weeks. Upper respiratory tract infection with cough and congestion. EXAM: CHEST - 2 VIEW COMPARISON:  11/26/2023 FINDINGS: Enlarged cardiac silhouette with an increase in size. Mild increase in prominence of the pulmonary vasculature and interstitial markings. Interval small bilateral pleural effusions. Thoracolumbar spine degenerative changes. IMPRESSION: Interval  development of mild congestive heart failure. Electronically Signed   By: Catherin Closs M.D.   On: 01/20/2024 13:54      Rosena Conradi, MD  [facility] 01/22/2024  If 7PM-7AM, please contact night-coverage

## 2024-01-22 NOTE — Plan of Care (Signed)

## 2024-01-22 NOTE — Progress Notes (Addendum)
 Notified Dr. Efrain Grant that patient is requesting to be assessed for sinus and upper respiratory infection due to symptoms of nasal congestion and headache. Order for Flonase  received.

## 2024-01-23 DIAGNOSIS — I5033 Acute on chronic diastolic (congestive) heart failure: Secondary | ICD-10-CM | POA: Diagnosis not present

## 2024-01-23 DIAGNOSIS — I480 Paroxysmal atrial fibrillation: Secondary | ICD-10-CM | POA: Diagnosis present

## 2024-01-23 LAB — CBC
HCT: 36.2 % — ABNORMAL LOW (ref 39.0–52.0)
Hemoglobin: 11.7 g/dL — ABNORMAL LOW (ref 13.0–17.0)
MCH: 31.5 pg (ref 26.0–34.0)
MCHC: 32.3 g/dL (ref 30.0–36.0)
MCV: 97.6 fL (ref 80.0–100.0)
Platelets: 224 10*3/uL (ref 150–400)
RBC: 3.71 MIL/uL — ABNORMAL LOW (ref 4.22–5.81)
RDW: 12.7 % (ref 11.5–15.5)
WBC: 6.7 10*3/uL (ref 4.0–10.5)
nRBC: 0 % (ref 0.0–0.2)

## 2024-01-23 LAB — MAGNESIUM: Magnesium: 2 mg/dL (ref 1.7–2.4)

## 2024-01-23 LAB — BASIC METABOLIC PANEL WITH GFR
Anion gap: 7 (ref 5–15)
BUN: 14 mg/dL (ref 6–20)
CO2: 25 mmol/L (ref 22–32)
Calcium: 8.5 mg/dL — ABNORMAL LOW (ref 8.9–10.3)
Chloride: 104 mmol/L (ref 98–111)
Creatinine, Ser: 1.21 mg/dL (ref 0.61–1.24)
GFR, Estimated: >60 mL/min (ref >60)
Glucose, Bld: 102 mg/dL — ABNORMAL HIGH (ref 70–99)
Potassium: 3.5 mmol/L (ref 3.5–5.1)
Sodium: 136 mmol/L (ref 135–145)

## 2024-01-23 MED ORDER — IRBESARTAN 300 MG PO TABS
150.0000 mg | ORAL_TABLET | Freq: Every day | ORAL | Status: DC
  Administered 2024-01-23 – 2024-01-25 (×3): 150 mg via ORAL
  Filled 2024-01-23 (×3): qty 1

## 2024-01-23 MED ORDER — IRBESARTAN 75 MG PO TABS: 75.0000 mg | ORAL_TABLET | Freq: Every day | ORAL | Status: DC

## 2024-01-23 MED ORDER — POTASSIUM CHLORIDE CRYS ER 20 MEQ PO TBCR
40.0000 meq | EXTENDED_RELEASE_TABLET | Freq: Every day | ORAL | Status: DC
  Administered 2024-01-24 – 2024-01-26 (×3): 40 meq via ORAL
  Filled 2024-01-23 (×3): qty 2

## 2024-01-23 NOTE — Progress Notes (Signed)
 PROGRESS NOTE    Chad Lyons  ZHY:[id number] DOB: 1965-10-30 DOA: 01/20/2024 PCP: Alto Atta, NP     Brief Narrative:  Chad Lyons  is a 58 y.o. male with past medical history of PAF s/p cardioversion, on amiodarone  and Eliquis , chronic diastolic CHF, GERD, obstructive sleep apnea uses CPAP at night, anxiety who presented tothe hospital with lower extremity swelling and shortness of breath, orthopnea and exertional dyspnea.  Of note he was on Lasix  120 mg in the past and had been recently decreased to 40 mg.  Patient also reported dry cough.  In the ED,patient had significant edema with crackles and elevated BNP.  Patient was then considered for admission to hospital for acute decompensated heart failure.     New events last 24 hours / Subjective: Cardiology started irbesartan  as his blood pressures have been elevated.  He is diuresed around 3-1/2 L.  Feels somewhat better.  No chest pain or shortness of breath.  He is still having some swelling.  Assessment & Plan:   Principal Problem:   Acute on chronic diastolic CHF (congestive heart failure) (HCC)  Appreciate cardiology  Continue diuresis Lasix  40 mg IV bid w daily Klor con 40 mEq  Monitor labs  Coreg  3.125 mg twice daily  Active Problems:   PAF  Eliquis  5 mg bid  Amiodarone  400 mg daily    OBESITY, MORBID  Lifestyle modifications recommended    Essential hypertension  Irbesartan  150 mg daily    Hypertrophic obstructive cardiomyopathy (HCC)  Follows with advanced heart failure clinic    Acute hypoxemic respiratory failure (HCC)  Resolved  DVT prophylaxis: Eliquis  Code Status: Full Family Communication: None bedside Coming From: Home Disposition Plan: Home Barriers to Discharge: Clinical improvement  Consultants:  Cardiology  Procedures:  None  Objective: Vitals:   01/23/24 0500 01/23/24 0523 01/23/24 0808 01/23/24 1034  BP:  (!) 153/100  (!) 161/98  Pulse:  (!) 57  (!) 54  Resp:  19 18 18   Temp:   98.1 F (36.7 C) 98.3 F (36.8 C) 99 F (37.2 C)  TempSrc:  Oral Oral Oral  SpO2:  99% 99%   Weight: 134.4 kg     Height:        Intake/Output Summary (Last 24 hours) at 01/23/2024 1359 Last data filed at 01/23/2024 0800 Gross per 24 hour  Intake 600 ml  Output 1550 ml  Net -950 ml   Filed Weights   01/21/24 1958 01/22/24 0358 01/23/24 0500  Weight: 133.1 kg 133 kg 134.4 kg    Examination:  General exam: Appears calm and comfortable  Respiratory system: Clear to auscultation. Respiratory effort normal. No respiratory distress. No conversational dyspnea.  Cardiovascular system: S1 & S2 heard, RRR. No murmurs. 2+ pitting bilateral lower extremity edema tapering at the mid tibia Gastrointestinal system: Abdomen is nondistended, soft and nontender. Normal bowel sounds heard. Central nervous system: Alert and oriented. No focal neurological deficits. Speech clear.  Extremities: Symmetric in appearance  Skin: No rashes, lesions or ulcers on exposed skin  Psychiatry: Judgement and insight appear normal. Mood & affect appropriate.   Data Reviewed: I have personally reviewed following labs and imaging studies  CBC: Recent Labs  Lab 01/20/24 1258 01/21/24 0357 01/22/24 0247 01/23/24 0241  WBC 7.7 6.5 6.6 6.7  NEUTROABS  --  4.1 4.3  --   HGB 12.1* 11.8* 11.5* 11.7*  HCT 37.9* 36.0* 36.0* 36.2*  MCV 100.0 102.0* 98.9 97.6  PLT 248 202 213 224  Basic Metabolic Panel: Recent Labs  Lab 01/20/24 1258 01/21/24 0357 01/22/24 0247 01/23/24 0241  NA 139 140 140 136  K 3.1* 3.2* 3.5 3.5  CL 100 104 105 104  CO2 26 26 28 25   GLUCOSE 93 91 105* 102*  BUN 13 12 13 14   CREATININE 1.17 1.16 1.34* 1.21  CALCIUM 9.0 8.7* 8.7* 8.5*  MG  --  1.9 1.9 2.0   GFR: Estimated Creatinine Clearance: 97 mL/min (by C-G formula based on SCr of 1.21 mg/dL).  HbA1C: Recent Labs    01/21/24 0357  HGBA1C 4.6*   Thyroid  Function Tests: Recent Labs    01/20/24 1554  TSH 1.488     Scheduled Meds:  amiodarone   400 mg Oral Daily   apixaban   5 mg Oral BID   carvedilol   3.125 mg Oral BID WC   fluticasone   2 spray Each Nare Daily   furosemide   40 mg Intravenous Q12H   irbesartan   150 mg Oral Daily   potassium chloride   40 mEq Oral Daily   Continuous Infusions:   LOS: 3 days    Time spent: 35 minutes   Jobe Mulder, DO [facility] 01/23/2024, 1:59 PM   Available via Epic secure chat 7am-7pm After these hours, please refer to coverage provider listed on [email or web address]

## 2024-01-23 NOTE — Progress Notes (Signed)
  Progress Note  Patient Name: Chad Lyons Date of Encounter: 01/23/2024 [facility] Cardiologist: Bruce Caper  Interval Summary   58 year old gentleman who presents with acute on chronic combined congestive heart failure.  He presents with weight gain, PND, orthopnea, exertional dyspnea.  Hx of HFrEF, OSA, obesity, PAF   He is diuresed 3.5 L so far during this hospitalization. Blood pressure was low on admission.  It is elevated today.  He has been followed in the congestive heart failure clinic by Dr. Bruce Caper   Echocardiogram from November 27, 2023 reveals slightly improved LVEF of 50 to 55%.  He has severe LVH.  He has grade 1 diastolic dysfunction. Trivial mitral regurgitation  BP is up today  He has tried entresto  but this caused hypotension in the past     Vital Signs Vitals:   01/23/24 0003 01/23/24 0500 01/23/24 0523 01/23/24 0808  BP: (!) 142/101  (!) 153/100   Pulse: (!) 44  (!) 57   Resp: 20  19 18   Temp: 98.2 F (36.8 C)  98.1 F (36.7 C) 98.3 F (36.8 C)  TempSrc: Oral  Oral Oral  SpO2: 98%  99% 99%  Weight:  134.4 kg    Height:        Intake/Output Summary (Last 24 hours) at 01/23/2024 0914 Last data filed at 01/23/2024 0800 Gross per 24 hour  Intake 840 ml  Output 1550 ml  Net -710 ml      01/23/2024    5:00 AM 01/22/2024    3:58 AM 01/21/2024    7:58 PM  Last 3 Weights  Weight (lbs) 296 lb 4.8 oz 293 lb 3.4 oz 293 lb 6.4 oz  Weight (kg) 134.401 kg 133 kg 133.085 kg      Telemetry/ECG  Chinita Cough  - Personally Reviewed  Physical Exam  GEN: No acute distress.   Neck: No JVD Cardiac: RRR, no murmurs, rubs, or gallops.  Respiratory: Clear to auscultation bilaterally. GI: Soft, nontender, non-distended  MS: trace - 1 +  edema  Assessment & Plan  Acute on chronic combined systolic and diastolic congestive heart failure: He seems to be making progress.  He has diuresed 3.5 L so far during this hospitalization.  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  Continue Lasix .  Blood pressure is little high today. Start IIrbesartan 150  mg po this am   Cont lasix    He has been tried on Entresto  but became hypotensive   2.  Hypertrophic CM :   follows with the Advanced CHF team   3.  PAF:  currently being loaded with amiodarone    4.  Dilated aortic root :      For questions or updates, please contact [facility] Please consult [email or web address] for contact info under       Signed, Ahmad Alert, MD

## 2024-01-24 DIAGNOSIS — I5033 Acute on chronic diastolic (congestive) heart failure: Secondary | ICD-10-CM | POA: Diagnosis not present

## 2024-01-24 LAB — CBC
HCT: 34.5 % — ABNORMAL LOW (ref 39.0–52.0)
Hemoglobin: 11 g/dL — ABNORMAL LOW (ref 13.0–17.0)
MCH: 31.3 pg (ref 26.0–34.0)
MCHC: 31.9 g/dL (ref 30.0–36.0)
MCV: 98.3 fL (ref 80.0–100.0)
Platelets: 203 10*3/uL (ref 150–400)
RBC: 3.51 MIL/uL — ABNORMAL LOW (ref 4.22–5.81)
RDW: 12.7 % (ref 11.5–15.5)
WBC: 5.8 10*3/uL (ref 4.0–10.5)
nRBC: 0 % (ref 0.0–0.2)

## 2024-01-24 LAB — BASIC METABOLIC PANEL WITH GFR
Anion gap: 10 (ref 5–15)
BUN: 15 mg/dL (ref 6–20)
CO2: 26 mmol/L (ref 22–32)
Calcium: 8.7 mg/dL — ABNORMAL LOW (ref 8.9–10.3)
Chloride: 102 mmol/L (ref 98–111)
Creatinine, Ser: 1.13 mg/dL (ref 0.61–1.24)
GFR, Estimated: >60 mL/min (ref >60)
Glucose, Bld: 104 mg/dL — ABNORMAL HIGH (ref 70–99)
Potassium: 3.6 mmol/L (ref 3.5–5.1)
Sodium: 138 mmol/L (ref 135–145)

## 2024-01-24 LAB — MAGNESIUM: Magnesium: 2.1 mg/dL (ref 1.7–2.4)

## 2024-01-24 NOTE — Plan of Care (Signed)
  Problem: Clinical Measurements: Goal: Will remain free from infection Outcome: Progressing   Problem: Activity: Goal: Risk for activity intolerance will decrease Outcome: Progressing   

## 2024-01-24 NOTE — Progress Notes (Signed)
 PROGRESS NOTE    Chad Lyons Chad Lyons  NWG:[id number] DOB: 1966-02-02 DOA: 01/20/2024 PCP: Alto Atta, NP     Brief Narrative:  Chad Lyons  is a 58 y.o. male with past medical history of PAF s/p cardioversion, on amiodarone  and Eliquis , chronic diastolic CHF, GERD, obstructive sleep apnea uses CPAP at night, anxiety who presented tothe hospital with lower extremity swelling and shortness of breath, orthopnea and exertional dyspnea.  Of note he was on Lasix  120 mg in the past and had been recently decreased to 40 mg.  Patient also reported dry cough.  In the ED,patient had significant edema with crackles and elevated BNP.  Patient was then considered for admission to hospital for acute decompensated heart failure.    New events last 24 hours / Subjective: Down another 1.8 L overnight.  Breathing better and feels the swelling is improving.  He is not coughing does not have any lightheadedness.  Assessment & Plan:    Principal Problem:   Acute on chronic diastolic CHF (congestive heart failure) (HCC)             Appreciate cardiology             Continue diuresis Lasix  40 mg IV bid w daily Klor con 40 mEq             Monitor labs             Coreg  3.125 mg twice daily   Active Problems:   PAF             Eliquis  5 mg bid             Amiodarone  400 mg daily     OBESITY, MORBID             Lifestyle modifications recommended     Essential hypertension             Irbesartan  150 mg daily- not getting symptomatic or low readings     Hypertrophic obstructive cardiomyopathy (HCC)             Follows with advanced heart failure clinic     Acute hypoxemic respiratory failure (HCC)             Resolved   DVT prophylaxis: Eliquis  Code Status: Full Family Communication: None bedside Coming From: Home Disposition Plan: Home Barriers to Discharge: Clinical improvement   Consultants:  Cardiology   Procedures:  None  Objective: Vitals:   01/24/24 0432 01/24/24 0803 01/24/24 1133  01/24/24 1153  BP: (!) 146/85 137/83 (!) 145/92 (!) 145/92  Pulse: (!) 50 (!) 50 (!) 51 (!) 52  Resp: 20 18 18 18   Temp: 97.6 F (36.4 C) 98.1 F (36.7 C)  98.8 F (37.1 C)  TempSrc: Oral Oral  Oral  SpO2: 96% 98% 99%   Weight: 135.5 kg     Height:        Intake/Output Summary (Last 24 hours) at 01/24/2024 1240 Last data filed at 01/24/2024 1000 Gross per 24 hour  Intake 458 ml  Output 2325 ml  Net -1867 ml   Filed Weights   01/22/24 0358 01/23/24 0500 01/24/24 0432  Weight: 133 kg 134.4 kg 135.5 kg    Examination:  General exam: Appears calm and comfortable  Respiratory system: Clear to auscultation. Respiratory effort normal. No respiratory distress. No conversational dyspnea.  Cardiovascular system: S1 & S2 heard, RRR. No murmurs.  The bilateral extremities, 2+ pitting edema tapering to 1+ pitting middle third, improved  from yesterday. Gastrointestinal system: Abdomen is nondistended, soft and nontender. Normal bowel sounds heard. Central nervous system: Alert and oriented. No focal neurological deficits. Speech clear.  Extremities: Symmetric in appearance  Skin: No rashes, lesions or ulcers on exposed skin  Psychiatry: Judgement and insight appear normal. Mood & affect appropriate.   Data Reviewed: I have personally reviewed following labs and imaging studies  CBC: Recent Labs  Lab 01/20/24 1258 01/21/24 0357 01/22/24 0247 01/23/24 0241 01/24/24 0306  WBC 7.7 6.5 6.6 6.7 5.8  NEUTROABS  --  4.1 4.3  --   --   HGB 12.1* 11.8* 11.5* 11.7* 11.0*  HCT 37.9* 36.0* 36.0* 36.2* 34.5*  MCV 100.0 102.0* 98.9 97.6 98.3  PLT 248 202 213 224 203   Basic Metabolic Panel: Recent Labs  Lab 01/20/24 1258 01/21/24 0357 01/22/24 0247 01/23/24 0241 01/24/24 0306  NA 139 140 140 136 138  K 3.1* 3.2* 3.5 3.5 3.6  CL 100 104 105 104 102  CO2 26 26 28 25 26   GLUCOSE 93 91 105* 102* 104*  BUN 13 12 13 14 15   CREATININE 1.17 1.16 1.34* 1.21 1.13  CALCIUM 9.0 8.7* 8.7* 8.5*  8.7*  MG  --  1.9 1.9 2.0 2.1   GFR: Estimated Creatinine Clearance: 104.3 mL/min (by C-G formula based on SCr of 1.13 mg/dL).   Scheduled Meds:  amiodarone   400 mg Oral Daily   apixaban   5 mg Oral BID   carvedilol   3.125 mg Oral BID WC   fluticasone   2 spray Each Nare Daily   furosemide   40 mg Intravenous Q12H   irbesartan   150 mg Oral Daily   potassium chloride   40 mEq Oral Daily   Continuous Infusions:   LOS: 4 days    Time spent: 30 minutes   Jobe Mulder, DO [facility] 01/24/2024, 12:40 PM   Available via Epic secure chat 7am-7pm After these hours, please refer to coverage provider listed on [email or web address]

## 2024-01-24 NOTE — Plan of Care (Signed)
   Problem: Education: Goal: Knowledge of General Education information will improve Description: Including pain rating scale, medication(s)/side effects and non-pharmacologic comfort measures Outcome: Progressing   Problem: Clinical Measurements: Goal: Respiratory complications will improve Outcome: Progressing   Problem: Activity: Goal: Risk for activity intolerance will decrease Outcome: Progressing   Problem: Coping: Goal: Level of anxiety will decrease Outcome: Progressing   Problem: Safety: Goal: Ability to remain free from injury will improve Outcome: Progressing

## 2024-01-24 NOTE — Progress Notes (Signed)
  Progress Note  Patient Name: Chad Lyons Date of Encounter: 01/24/2024 [facility] Cardiologist: Bruce Caper  Interval Summary   58 year old gentleman who presents with acute on chronic combined congestive heart failure.  He presents with weight gain, PND, orthopnea, exertional dyspnea.  Hx of HFrEF, OSA, obesity, PAF   Has diuresed 5.6 liters so far this amisiion    He has been followed in the congestive heart failure clinic by Dr. Bruce Caper   Echocardiogram from November 27, 2023 reveals slightly improved LVEF of 50 to 55%.  He has severe LVH.  He has grade 1 diastolic dysfunction. Trivial mitral regurgitation     Vital Signs Vitals:   01/23/24 2016 01/24/24 0108 01/24/24 0432 01/24/24 0803  BP: 121/69 120/77 (!) 146/85 137/83  Pulse: (!) 51 (!) 45 (!) 50 (!) 50  Resp: 20 20 20 18   Temp: 98.5 F (36.9 C) 97.6 F (36.4 C) 97.6 F (36.4 C) 98.1 F (36.7 C)  TempSrc: Oral Axillary Oral Oral  SpO2: 96% 94% 96% 98%  Weight:   135.5 kg   Height:        Intake/Output Summary (Last 24 hours) at 01/24/2024 0836 Last data filed at 01/24/2024 0802 Gross per 24 hour  Intake 220 ml  Output 2325 ml  Net -2105 ml      01/24/2024    4:32 AM 01/23/2024    5:00 AM 01/22/2024    3:58 AM  Last 3 Weights  Weight (lbs) 298 lb 11.6 oz 296 lb 4.8 oz 293 lb 3.4 oz  Weight (kg) 135.5 kg 134.401 kg 133 kg      Telemetry/ECG  Sinus bradycardia at a rate of 58.  Personally reviewed.   Physical Exam  GEN: No acute distress.   Neck: No JVD Cardiac: RRR, no murmurs, rubs, or gallops.  Respiratory: Clear to auscultation bilaterally. GI: Soft, nontender, non-distended  MS: 1 + leg edema   Assessment & Plan  Acute on chronic combined systolic and diastolic congestive heart failure: He seems to be making progress.   Continue Irbesartan  150 for now   I anticipate being able to DC him in the next several days     2.  Hypertrophic CM :   follows with the Advanced CHF team   3.   PAF:  currently being loaded with amiodarone   Remains in sinus rhythm   4.  Dilated aortic root :      For questions or updates, please contact [facility] Please consult [email or web address] for contact info under       Signed, Ahmad Alert, MD

## 2024-01-25 DIAGNOSIS — I5033 Acute on chronic diastolic (congestive) heart failure: Secondary | ICD-10-CM | POA: Diagnosis not present

## 2024-01-25 LAB — CBC
HCT: 35 % — ABNORMAL LOW (ref 39.0–52.0)
Hemoglobin: 11.2 g/dL — ABNORMAL LOW (ref 13.0–17.0)
MCH: 31.5 pg (ref 26.0–34.0)
MCHC: 32 g/dL (ref 30.0–36.0)
MCV: 98.6 fL (ref 80.0–100.0)
Platelets: 232 10*3/uL (ref 150–400)
RBC: 3.55 MIL/uL — ABNORMAL LOW (ref 4.22–5.81)
RDW: 12.6 % (ref 11.5–15.5)
WBC: 6.3 10*3/uL (ref 4.0–10.5)
nRBC: 0 % (ref 0.0–0.2)

## 2024-01-25 LAB — BASIC METABOLIC PANEL WITH GFR
Anion gap: 5 (ref 5–15)
BUN: 12 mg/dL (ref 6–20)
CO2: 27 mmol/L (ref 22–32)
Calcium: 8.6 mg/dL — ABNORMAL LOW (ref 8.9–10.3)
Chloride: 106 mmol/L (ref 98–111)
Creatinine, Ser: 1.16 mg/dL (ref 0.61–1.24)
GFR, Estimated: >60 mL/min (ref >60)
Glucose, Bld: 102 mg/dL — ABNORMAL HIGH (ref 70–99)
Potassium: 3.5 mmol/L (ref 3.5–5.1)
Sodium: 138 mmol/L (ref 135–145)

## 2024-01-25 MED ORDER — SACUBITRIL-VALSARTAN 49-51 MG PO TABS
1.0000 | ORAL_TABLET | Freq: Two times a day (BID) | ORAL | Status: DC
  Administered 2024-01-25 – 2024-01-28 (×6): 1 via ORAL
  Filled 2024-01-25 (×6): qty 1

## 2024-01-25 NOTE — Plan of Care (Signed)
 Patient still needing diuresing and decrease in weight gain. MD to eval meds and output.

## 2024-01-25 NOTE — Progress Notes (Signed)
 PROGRESS NOTE  Chad Lyons ZOX:[id number] DOB: 10-Apr-1966 DOA: 01/20/2024 PCP: Alto Atta, NP   LOS: 5 days   Brief narrative:   Chad Lyons  is a 58 y.o. male with past medical history of paroxysmal atrial fibrillation, s/p cardioversion, on amiodarone  and Eliquis , chronic diastolic CHF, GERD, obstructive sleep apnea uses CPAP at night, anxiety, presented to hospital with lower extremity swelling and shortness of breath orthopnea and exertional dyspnea.  Of note he was on Lasix  120 mg in the past and had been recently decreased to 40 mg.  Patient also reported dry cough.  In the ED,patient had significant edema with crackles and elevated BNP.  Patient was then considered for admission to hospital for acute decompensated heart failure.     Assessment/Plan: Principal Problem:   Acute on chronic diastolic CHF (congestive heart failure) (HCC) Active Problems:   OBESITY, MORBID   Essential hypertension   Hypertrophic obstructive cardiomyopathy (HCC)   Acute hypoxemic respiratory failure (HCC)   PAF (paroxysmal atrial fibrillation) (HCC)   Acute on chronic diastolic CHF History of hypertrophic cardiomyopathy. Likely secondary to changes in his diuretic regimen.  Had 20 to 25 pounds of volume overload as per the patient.  Review of recent 2D echocardiogram from 11/27/2023 with LV EF 55% continue telemetry monitoring.  Currently on IV 40 mg twice daily.  Continue  fluid restriction,Coreg .   Not repeating 2D echocardiogram since patient had recently.  Cardiology actively following the patient.  Patient is negative balance for 5429 mL  Mild hypokalemia.  Latest potassium 3.5.  Will continue to replenish.    Paroxysmal atrial fibrillation.  History of cardioversion in the past.   Currently on Coreg  Eliquis  and amiodarone .  Will continue.  Rate controlled at this time.    Obesity with OSA. Body mass index is 38.04 kg/m. Chad Lyons  Patient would benefit from lifestyle modifications.  Continue CPAP    GERD.  Continue PPI.   Anxiety.  Continue home dose benzodiazepine.   DVT prophylaxis:  apixaban  (ELIQUIS ) tablet 5 mg   Disposition: Home likely in 1 to 2 days when optimized as per cardiology..  Status is: Inpatient Remains inpatient appropriate because: Decompensated heart failure, IV diuretics,    Code Status:     Code Status: Full Code  Family Communication: None at bedside  Consultants: Cardiology  Procedures: None  Anti-infectives:  None  Anti-infectives (From admission, onward)    None       Subjective: Today, patient was seen and examined at bedside.  Patient states that he feels okay with breathing.  Denies any chest pain, nausea vomiting fever chills or rigor.  Still has significant lower extremity edema.   Objective: Vitals:   01/25/24 0850 01/25/24 1125  BP: (!) 154/97 (!) 141/96  Pulse: (!) 53 (!) 51  Resp: 18 18  Temp: 98.1 F (36.7 C) 97.8 F (36.6 C)  SpO2: 100% 99%    Intake/Output Summary (Last 24 hours) at 01/25/2024 1142 Last data filed at 01/25/2024 0900 Gross per 24 hour  Intake 718 ml  Output 750 ml  Net -32 ml   Filed Weights   01/23/24 0500 01/24/24 0432 01/25/24 0626  Weight: 134.4 kg 135.5 kg 134.4 kg   Body mass index is 38.04 kg/m.   Physical Exam:  GENERAL: Patient is alert awake and oriented. Not in obvious distress.  Obese built, on room air HENT: No scleral pallor or icterus. Pupils equally reactive to light. Oral mucosa is moist NECK: is supple, no gross  swelling noted.  Distended neck veins. CHEST: .  Decreased breath sounds bilaterally.  No crackles noted. CVS: S1 and S2 heard, no murmur. Regular rate and rhythm.  ABDOMEN: Soft, non-tender, bowel sounds are present. EXTREMITIES: Second bilateral lower extremity pitting edema up to the thighs. CNS: Cranial nerves are intact. No focal motor deficits. SKIN: warm and dry without rashes.  Data Review: I have personally reviewed the following laboratory data and  studies,  CBC: Recent Labs  Lab 01/21/24 0357 01/22/24 0247 01/23/24 0241 01/24/24 0306 01/25/24 0305  WBC 6.5 6.6 6.7 5.8 6.3  NEUTROABS 4.1 4.3  --   --   --   HGB 11.8* 11.5* 11.7* 11.0* 11.2*  HCT 36.0* 36.0* 36.2* 34.5* 35.0*  MCV 102.0* 98.9 97.6 98.3 98.6  PLT 202 213 224 203 232   Basic Metabolic Panel: Recent Labs  Lab 01/21/24 0357 01/22/24 0247 01/23/24 0241 01/24/24 0306 01/25/24 0305  NA 140 140 136 138 138  K 3.2* 3.5 3.5 3.6 3.5  CL 104 105 104 102 106  CO2 26 28 25 26 27   GLUCOSE 91 105* 102* 104* 102*  BUN 12 13 14 15 12   CREATININE 1.16 1.34* 1.21 1.13 1.16  CALCIUM 8.7* 8.7* 8.5* 8.7* 8.6*  MG 1.9 1.9 2.0 2.1  --    Liver Function Tests: No results for input(s): "AST", "ALT", "ALKPHOS", "BILITOT", "PROT", "ALBUMIN" in the last 168 hours. No results for input(s): "LIPASE", "AMYLASE" in the last 168 hours. No results for input(s): "AMMONIA" in the last 168 hours. Cardiac Enzymes: No results for input(s): "CKTOTAL", "CKMB", "CKMBINDEX", "TROPONINI" in the last 168 hours. BNP (last 3 results) Recent Labs    12/04/23 1215 01/20/24 1306 01/21/24 0357  BNP 183.7* 1,170.1* 670.5*    ProBNP (last 3 results) No results for input(s): "PROBNP" in the last 8760 hours.  CBG: No results for input(s): "GLUCAP" in the last 168 hours. No results found for this or any previous visit (from the past 240 hours).   Studies: No results found.     Chad Yanko, MD  [facility] 01/25/2024  If 7PM-7AM, please contact night-coverage

## 2024-01-25 NOTE — Plan of Care (Signed)
   Problem: Clinical Measurements: Goal: Respiratory complications will improve Outcome: Progressing   Problem: Activity: Goal: Risk for activity intolerance will decrease Outcome: Progressing

## 2024-01-25 NOTE — Progress Notes (Signed)
  Progress Note  Patient Name: Chad Lyons Date of Encounter: 01/25/2024 [facility] Cardiologist: Bruce Caper  Interval Summary   58 year old gentleman who presents with acute on chronic combined congestive heart failure.  He presents with weight gain, PND, orthopnea, exertional dyspnea.  Hx of HFrEF, OSA, obesity, PAF    I/O  -5.4 liters so far this admission      He has been followed in the congestive heart failure clinic by Dr. Bruce Caper   Echocardiogram from November 27, 2023 reveals slightly improved LVEF of 50 to 55%.  He has severe LVH.  He has grade 1 diastolic dysfunction. Trivial mitral regurgitation     Vital Signs Vitals:   01/24/24 1537 01/25/24 0130 01/25/24 0626 01/25/24 0850  BP: 120/82 124/76 137/78 (!) 154/97  Pulse: (!) 50 (!) 57 (!) 54 (!) 53  Resp: 20 20 20 18   Temp: 98.4 F (36.9 C) 98.4 F (36.9 C) 98 F (36.7 C) 98.1 F (36.7 C)  TempSrc: Oral Oral Oral Oral  SpO2: 98% 100% 98% 100%  Weight:   134.4 kg   Height:        Intake/Output Summary (Last 24 hours) at 01/25/2024 1016 Last data filed at 01/25/2024 0900 Gross per 24 hour  Intake 718 ml  Output 750 ml  Net -32 ml      01/25/2024    6:26 AM 01/24/2024    4:32 AM 01/23/2024    5:00 AM  Last 3 Weights  Weight (lbs) 296 lb 4.8 oz 298 lb 11.6 oz 296 lb 4.8 oz  Weight (kg) 134.4 kg 135.5 kg 134.401 kg      Telemetry/ECG  Sinus bradycardia at a rate of 58.  Personally reviewed.   Physical Exam  GEN: No acute distress.   Neck: No JVD Cardiac: RR   Respiratory: Clear to auscultation bilaterally. GI: Soft, nontender, non-distended  MS:  1 +  leg edema   Assessment & Plan  Acute on chronic combined systolic and diastolic congestive heart failure: He seems to be making progress.    BP is elevated.   I think we could try entresto  again. Will run by Dr. Bruce Caper      2.  Hypertrophic CM :   follows with the Advanced CHF team   3.  PAF:  currently being loaded with  amiodarone   Remains in sinus rhythm   4.  Dilated aortic root :      For questions or updates, please contact [facility] Please consult [email or web address] for contact info under       Signed, Ahmad Alert, MD

## 2024-01-26 LAB — BASIC METABOLIC PANEL WITH GFR
Anion gap: 7 (ref 5–15)
BUN: 13 mg/dL (ref 6–20)
CO2: 27 mmol/L (ref 22–32)
Calcium: 8.5 mg/dL — ABNORMAL LOW (ref 8.9–10.3)
Chloride: 104 mmol/L (ref 98–111)
Creatinine, Ser: 1.32 mg/dL — ABNORMAL HIGH (ref 0.61–1.24)
GFR, Estimated: >60 mL/min (ref >60)
Glucose, Bld: 108 mg/dL — ABNORMAL HIGH (ref 70–99)
Potassium: 4 mmol/L (ref 3.5–5.1)
Sodium: 138 mmol/L (ref 135–145)

## 2024-01-26 LAB — CBC
HCT: 39.3 % (ref 39.0–52.0)
Hemoglobin: 12.6 g/dL — ABNORMAL LOW (ref 13.0–17.0)
MCH: 31.7 pg (ref 26.0–34.0)
MCHC: 32.1 g/dL (ref 30.0–36.0)
MCV: 99 fL (ref 80.0–100.0)
Platelets: 254 10*3/uL (ref 150–400)
RBC: 3.97 MIL/uL — ABNORMAL LOW (ref 4.22–5.81)
RDW: 12.6 % (ref 11.5–15.5)
WBC: 6.5 10*3/uL (ref 4.0–10.5)
nRBC: 0 % (ref 0.0–0.2)

## 2024-01-26 LAB — MAGNESIUM: Magnesium: 2.5 mg/dL — ABNORMAL HIGH (ref 1.7–2.4)

## 2024-01-26 NOTE — Progress Notes (Signed)
  Progress Note  Patient Name: Chad Lyons Date of Encounter: 01/26/2024 [facility] Cardiologist: Bruce Caper  Interval Summary   58 year old gentleman who presents with acute on chronic combined congestive heart failure.  He presents with weight gain, PND, orthopnea, exertional dyspnea.  Hx of HFrEF, OSA, obesity, PAF     We started Entresto  49-51  bid yesterday  Is on lasix  40 mg iv BID   I/O are -9.2 liters so far this admission   He is feeling much better    He has been followed in the congestive heart failure clinic by Dr. Bruce Caper   Echocardiogram from November 27, 2023 reveals slightly improved LVEF of 50 to 55%.  He has severe LVH.  He has grade 1 diastolic dysfunction. Trivial mitral regurgitation     Vital Signs Vitals:   01/25/24 2330 01/25/24 2342 01/26/24 0620 01/26/24 0830  BP:   130/80 138/86  Pulse: (!) 54  (!) 51 (!) 48  Resp: 18  18 20   Temp:  97.7 F (36.5 C) 97.7 F (36.5 C) 97.9 F (36.6 C)  TempSrc:  Oral Oral Oral  SpO2: 98%  100% 100%  Weight:   131.7 kg   Height:        Intake/Output Summary (Last 24 hours) at 01/26/2024 1656 Last data filed at 01/26/2024 0841 Gross per 24 hour  Intake 600 ml  Output 3300 ml  Net -2700 ml      01/26/2024    6:20 AM 01/25/2024    6:26 AM 01/24/2024    4:32 AM  Last 3 Weights  Weight (lbs) 290 lb 6.4 oz 296 lb 4.8 oz 298 lb 11.6 oz  Weight (kg) 131.725 kg 134.4 kg 135.5 kg      Telemetry/ECG  Sinus bradycardia at a rate of 58.  Personally reviewed.   Physical Exam  GEN: No acute distress.   Neck: No JVD Cardiac: rr Respiratory: clear  GI: Soft, nontender, non-distended  MS:  1 + leg edema, seem to be improving   Assessment & Plan  Acute on chronic combined systolic and diastolic congestive heart failure: He seems to be making progress.    BP is elevated.   He seems to be doing well with the entresto  Still getting lots of fluid off with the lasix  Consider adding spiro or Farxiga   at some point  - we could add it here vs. As OP    Will run by Dr. Bruce Caper      2.  Hypertrophic CM :   follows with the Advanced CHF team   3.  PAF:  currently being loaded with amiodarone   Remains in sinus rhythm   4.  Dilated aortic root :  Stable     For questions or updates, please contact [facility] Please consult [email or web address] for contact info under       Signed, Ahmad Alert, MD

## 2024-01-26 NOTE — Progress Notes (Signed)
 PROGRESS NOTE  Chad Lyons ZOX:[id number] DOB: 08-Oct-1965 DOA: 01/20/2024 PCP: Alto Atta, NP   LOS: 6 days   Brief narrative:   Chad Lyons  is a 58 y.o. male with past medical history of paroxysmal atrial fibrillation, s/p cardioversion, on amiodarone  and Eliquis , chronic diastolic CHF, GERD, obstructive sleep apnea uses CPAP at night, anxiety, presented to hospital with lower extremity swelling and shortness of breath orthopnea and exertional dyspnea.  Of note he was on Lasix  120 mg in the past and had been recently decreased to 40 mg.  Patient also reported dry cough.  In the ED,patient had significant edema with crackles and elevated BNP.  Patient was then considered for admission to hospital for acute decompensated heart failure.     Assessment/Plan: Principal Problem:   Acute on chronic diastolic CHF (congestive heart failure) (HCC) Active Problems:   OBESITY, MORBID   Essential hypertension   Hypertrophic obstructive cardiomyopathy (HCC)   Acute hypoxemic respiratory failure (HCC)   PAF (paroxysmal atrial fibrillation) (HCC)   Acute on chronic diastolic CHF History of hypertrophic cardiomyopathy. Decompensation this time likely secondary to changes in his diuretic regimen.  Had 20 to 25 pounds of volume overload as per the patient.  Review of recent 2D echocardiogram from 11/27/2023 with LV EF 55% continue telemetry monitoring.  Currently on IV 40 mg twice daily.  Continue  fluid restriction,Coreg .   Was started on Entresto  with significant diuresis yesterday.  Not repeating 2D echocardiogram since patient had recently.  Cardiology actively following the patient.  Patient is negative balance for 9074 mL  Mild hypokalemia.  Replenished and improved.  Latest potassium of 4.0.    Paroxysmal atrial fibrillation.  History of cardioversion in the past.   Currently on Coreg , Eliquis  and amiodarone .  Will continue.  Rate controlled at this time.    Obesity with OSA. Body mass index  is 37.29 kg/m. Chad Lyons  Patient would benefit from lifestyle modifications.  Continue CPAP   GERD.  Continue PPI.   Anxiety.  Continue home dose benzodiazepine.   DVT prophylaxis:  apixaban  (ELIQUIS ) tablet 5 mg   Disposition: Home likely in 1 to 2 days when optimized, as per cardiology.  Status is: Inpatient Remains inpatient appropriate because: Decompensated heart failure, IV diuretics, pending clinical improvement    Code Status:     Code Status: Full Code  Family Communication: Spoke to patient's daughter at bedside 01/25/2024  Consultants: Cardiology  Procedures: None  Anti-infectives:  None  Anti-infectives (From admission, onward)    None       Subjective: Today, patient was seen and examined at bedside.  Patient states that he feels much better today and had significant diuresis and weight loss of around 3 pounds or so yesterday.  Denies pain nausea vomiting fever chills or rigor.   Objective: Vitals:   01/26/24 0620 01/26/24 0830  BP: 130/80 138/86  Pulse: (!) 51 (!) 48  Resp: 18 20  Temp: 97.7 F (36.5 C) 97.9 F (36.6 C)  SpO2: 100% 100%    Intake/Output Summary (Last 24 hours) at 01/26/2024 0955 Last data filed at 01/26/2024 0841 Gross per 24 hour  Intake 720 ml  Output 4525 ml  Net -3805 ml   Filed Weights   01/24/24 0432 01/25/24 0626 01/26/24 0620  Weight: 135.5 kg 134.4 kg 131.7 kg   Body mass index is 37.29 kg/m.   Physical Exam:  General: Obese built, not in obvious distress HENT:   No scleral pallor or icterus  noted. Oral mucosa is moist.  Chest:    Diminished breath sounds bilaterally. No crackles or wheezes.  CVS: S1 &S2 heard. No murmur.  Regular rate and rhythm. Abdomen: Soft, nontender, nondistended.  Bowel sounds are heard.   Extremities: No cyanosis, clubbing but with significant edema.   Psych: Alert, awake and oriented, normal mood CNS:  No cranial nerve deficits.  Power equal in all extremities.   Skin: Warm and dry.   No rashes noted.   Data Review: I have personally reviewed the following laboratory data and studies,  CBC: Recent Labs  Lab 01/21/24 0357 01/22/24 0247 01/23/24 0241 01/24/24 0306 01/25/24 0305 01/26/24 0244  WBC 6.5 6.6 6.7 5.8 6.3 6.5  NEUTROABS 4.1 4.3  --   --   --   --   HGB 11.8* 11.5* 11.7* 11.0* 11.2* 12.6*  HCT 36.0* 36.0* 36.2* 34.5* 35.0* 39.3  MCV 102.0* 98.9 97.6 98.3 98.6 99.0  PLT 202 213 224 203 232 254   Basic Metabolic Panel: Recent Labs  Lab 01/21/24 0357 01/22/24 0247 01/23/24 0241 01/24/24 0306 01/25/24 0305 01/26/24 0244  NA 140 140 136 138 138 138  K 3.2* 3.5 3.5 3.6 3.5 4.0  CL 104 105 104 102 106 104  CO2 26 28 25 26 27 27   GLUCOSE 91 105* 102* 104* 102* 108*  BUN 12 13 14 15 12 13   CREATININE 1.16 1.34* 1.21 1.13 1.16 1.32*  CALCIUM 8.7* 8.7* 8.5* 8.7* 8.6* 8.5*  MG 1.9 1.9 2.0 2.1  --  2.5*   Liver Function Tests: No results for input(s): "AST", "ALT", "ALKPHOS", "BILITOT", "PROT", "ALBUMIN" in the last 168 hours. No results for input(s): "LIPASE", "AMYLASE" in the last 168 hours. No results for input(s): "AMMONIA" in the last 168 hours. Cardiac Enzymes: No results for input(s): "CKTOTAL", "CKMB", "CKMBINDEX", "TROPONINI" in the last 168 hours. BNP (last 3 results) Recent Labs    12/04/23 1215 01/20/24 1306 01/21/24 0357  BNP 183.7* 1,170.1* 670.5*    ProBNP (last 3 results) No results for input(s): "PROBNP" in the last 8760 hours.  CBG: No results for input(s): "GLUCAP" in the last 168 hours. No results found for this or any previous visit (from the past 240 hours).   Studies: No results found.     Rogina Schiano, MD  [facility] 01/26/2024  If 7PM-7AM, please contact night-coverage

## 2024-01-26 NOTE — Plan of Care (Signed)
 Less edema and states he feels much better.

## 2024-01-26 NOTE — Progress Notes (Signed)
   01/25/24 2330  BiPAP/CPAP/SIPAP  $ Non-Invasive Home Ventilator  Subsequent  BiPAP/CPAP/SIPAP Pt Type Adult  BiPAP/CPAP/SIPAP Resmed  Mask Type Full face mask  Dentures removed? Not applicable  Mask Size Large  Respiratory Rate 18 breaths/min  EPAP 4 cmH2O (per home setting)  FiO2 (%) 21 %  Patient Home Machine No  Patient Home Mask No  Patient Home Tubing No  Auto Titrate No  CPAP/SIPAP surface wiped down Yes  Device Plugged into RED Power Outlet Yes  BiPAP/CPAP /SiPAP Vitals  Pulse Rate (!) 54  Resp 18  SpO2 98 %  Bilateral Breath Sounds Clear;Diminished  MEWS Score/Color  MEWS Score 0  MEWS Score Color Green

## 2024-01-27 ENCOUNTER — Other Ambulatory Visit (HOSPITAL_COMMUNITY): Payer: Self-pay

## 2024-01-27 ENCOUNTER — Telehealth (HOSPITAL_COMMUNITY): Payer: Self-pay | Admitting: Pharmacy Technician

## 2024-01-27 DIAGNOSIS — E66812 Obesity, class 2: Secondary | ICD-10-CM

## 2024-01-27 DIAGNOSIS — I480 Paroxysmal atrial fibrillation: Secondary | ICD-10-CM

## 2024-01-27 DIAGNOSIS — F419 Anxiety disorder, unspecified: Secondary | ICD-10-CM

## 2024-01-27 DIAGNOSIS — I10 Essential (primary) hypertension: Secondary | ICD-10-CM | POA: Diagnosis not present

## 2024-01-27 DIAGNOSIS — I5033 Acute on chronic diastolic (congestive) heart failure: Secondary | ICD-10-CM | POA: Diagnosis not present

## 2024-01-27 DIAGNOSIS — E876 Hypokalemia: Secondary | ICD-10-CM

## 2024-01-27 LAB — CBC
HCT: 38.5 % — ABNORMAL LOW (ref 39.0–52.0)
Hemoglobin: 12.3 g/dL — ABNORMAL LOW (ref 13.0–17.0)
MCH: 31.1 pg (ref 26.0–34.0)
MCHC: 31.9 g/dL (ref 30.0–36.0)
MCV: 97.5 fL (ref 80.0–100.0)
Platelets: 258 10*3/uL (ref 150–400)
RBC: 3.95 MIL/uL — ABNORMAL LOW (ref 4.22–5.81)
RDW: 12.4 % (ref 11.5–15.5)
WBC: 6 10*3/uL (ref 4.0–10.5)
nRBC: 0 % (ref 0.0–0.2)

## 2024-01-27 LAB — BASIC METABOLIC PANEL WITH GFR
Anion gap: 7 (ref 5–15)
BUN: 12 mg/dL (ref 6–20)
CO2: 27 mmol/L (ref 22–32)
Calcium: 8.3 mg/dL — ABNORMAL LOW (ref 8.9–10.3)
Chloride: 104 mmol/L (ref 98–111)
Creatinine, Ser: 1.2 mg/dL (ref 0.61–1.24)
GFR, Estimated: >60 mL/min (ref >60)
Glucose, Bld: 108 mg/dL — ABNORMAL HIGH (ref 70–99)
Potassium: 3.8 mmol/L (ref 3.5–5.1)
Sodium: 138 mmol/L (ref 135–145)

## 2024-01-27 MED ORDER — SPIRONOLACTONE 25 MG PO TABS
25.0000 mg | ORAL_TABLET | Freq: Every day | ORAL | Status: DC
  Administered 2024-01-27 – 2024-01-28 (×2): 25 mg via ORAL
  Filled 2024-01-27 (×2): qty 1

## 2024-01-27 MED ORDER — AMIODARONE HCL 200 MG PO TABS
200.0000 mg | ORAL_TABLET | Freq: Every day | ORAL | Status: DC
  Administered 2024-01-27 – 2024-01-28 (×2): 200 mg via ORAL
  Filled 2024-01-27 (×2): qty 1

## 2024-01-27 MED ORDER — TORSEMIDE 20 MG PO TABS
20.0000 mg | ORAL_TABLET | Freq: Every day | ORAL | Status: DC
  Administered 2024-01-27 – 2024-01-28 (×2): 20 mg via ORAL
  Filled 2024-01-27 (×2): qty 1

## 2024-01-27 MED ORDER — POTASSIUM CHLORIDE CRYS ER 20 MEQ PO TBCR
20.0000 meq | EXTENDED_RELEASE_TABLET | Freq: Every day | ORAL | Status: DC
  Administered 2024-01-27 – 2024-01-28 (×2): 20 meq via ORAL
  Filled 2024-01-27 (×2): qty 1

## 2024-01-27 NOTE — Progress Notes (Signed)
  Progress Note   Patient: Chad Lyons XBM:[id number] DOB: [birth year] DOA: 01/20/2024     7 DOS: the patient was seen and examined on 01/27/2024   Brief hospital course: Mr. Bures was admitted to the hospital with the working diagnosis of heart failure decompensation.   58 y.o. male with past medical history of paroxysmal atrial fibrillation, s/p cardioversion, on amiodarone , chronic diastolic CHF, GERD, obstructive sleep apnea uses CPAP at night, anxiety, presented to hospital with lower extremity swelling and shortness of breath orthopnea and exertional dyspnea.  Of note he was on Lasix  120 mg in the past and had been recently decreased to 40 mg.   In the ED,patient had significant edema with crackles and elevated BNP.  Patient was then considered for admission to hospital for acute decompensated heart failure.   Assessment and Plan: * Acute on chronic diastolic CHF (congestive heart failure) (HCC) Echocardiogram with preserved LV systolic function with EF 50 to 55%, severe LVH, consistent with hypertrophic cardiomyopathy, RV with preserved systolic function, no significant valvular disease.   Urine output is 1,700 ml Systolic blood pressure 115 mmHg.   Plan to continue diuresis with oral torsemide 20 mg daily.  Entresto  and spironolactone .  Add ted hose.   Essential hypertension Continue blood pressure control with entreto,   PAF (paroxysmal atrial fibrillation) (HCC) Continue rate control with amiodarone  and anticoagulation with apixabab,  Continue telemetry monitoring  Hypokalemia Renal function with serum cr at 1,20 with K at 3,8 and serum bicarbonate at 27  Na 138   Plan to continue diuresis with furosemide .  Add Kcl to prevent hypokalemia   Obesity, class 2 Calculated BMI 36,9   Anxiety Continue with alprazolam .         Subjective: Patient is feeling better, dyspnea and edema continue to improve, no PND or orthopnea.   Physical Exam: Vitals:   01/26/24 1928  01/27/24 0005 01/27/24 0411 01/27/24 0735  BP: 112/70 112/81 109/65   Pulse: (!) 42 (!) 49 (!) 46   Resp: 20 20 20 20   Temp: 97.9 F (36.6 C) 97.8 F (36.6 C) 97.6 F (36.4 C)   TempSrc: Oral Oral Oral   SpO2: 100% 100% 100%   Weight:   130.6 kg   Height:       Neurology awake and alert ENT with mild pallor  Cardiovascular with S1 and S2 present and regular with no gallops, rubs or murmurs Respiratory with no rales or wheezing. No rhonchi  Abdomen with no distention  Positive lower extremity edema + non pitting  Data Reviewed:    Family Communication:  No family at the bedside   Disposition: Status is: Inpatient Remains inpatient appropriate because: recovering from heart failure   Planned Discharge Destination: Home     Author: Albertus Alt, MD 01/27/2024 1:42 PM  For on call review [email or web address].

## 2024-01-27 NOTE — Progress Notes (Signed)
  Progress Note  Patient Name: Chad Lyons Date of Encounter: 01/27/2024 [facility] Cardiologist: Bruce Caper  Interval Summary   58 year old gentleman who presents with acute on chronic combined congestive heart failure.  He presents with weight gain, PND, orthopnea, exertional dyspnea.  Hx of HFrEF, OSA, obesity, PAF   We started Entresto  49-51  bid    He has been followed in the congestive heart failure clinic by Dr. Bruce Caper   Echocardiogram from November 27, 2023 reveals slightly improved LVEF of 50 to 55%.  He has severe LVH.  He has grade 1 diastolic dysfunction. Trivial mitral regurgitation  He is near euvolemic today .  Will change the IV lasix  to po Torsemide,  Add spironolactone  25 Reduce Kdur to 20 meq a day   Anticipate DC tomorrow    Vital Signs Vitals:   01/26/24 1928 01/27/24 0005 01/27/24 0411 01/27/24 0735  BP: 112/70 112/81 109/65   Pulse: (!) 42 (!) 49 (!) 46   Resp: 20 20 20 20   Temp: 97.9 F (36.6 C) 97.8 F (36.6 C) 97.6 F (36.4 C)   TempSrc: Oral Oral Oral   SpO2: 100% 100% 100%   Weight:   130.6 kg   Height:        Intake/Output Summary (Last 24 hours) at 01/27/2024 0918 Last data filed at 01/27/2024 0800 Gross per 24 hour  Intake 118 ml  Output 1300 ml  Net -1182 ml      01/27/2024    4:11 AM 01/26/2024    6:20 AM 01/25/2024    6:26 AM  Last 3 Weights  Weight (lbs) 287 lb 14.7 oz 290 lb 6.4 oz 296 lb 4.8 oz  Weight (kg) 130.6 kg 131.725 kg 134.4 kg      Telemetry/ECG  Sinus bradycardia at a rate of 58.  Personally reviewed.   Physical Exam: Blood pressure 109/65, pulse (!) 46, temperature 97.6 F (36.4 C), temperature source Oral, resp. rate 20, height 6' 2 (1.88 m), weight 130.6 kg, SpO2 100%.       GEN:  middle age, moderately obese male  HEENT: Normal NECK: No JVD; No carotid bruits LYMPHATICS: No lymphadenopathy CARDIAC: RRR   RESPIRATORY:  Clear to auscultation without rales, wheezing or rhonchi  ABDOMEN:  Soft, non-tender, non-distended MUSCULOSKELETAL:  No edema; No deformity  SKIN: Warm and dry NEUROLOGIC:  Alert and oriented x 3   Assessment & Plan  Acute on chronic combined systolic and diastolic congestive heart failure: He seems to be making progress.     Is tolerating entresto  Will add spironolactone  25 mg a day  Change the iV lasix  to PO torsemide 20 mg a day  Reduce kdur to 20 meq a day      Will run by Dr. Bruce Caper      2.  Hypertrophic CM :   follows with the Advanced CHF team   3.  PAF:  will reduce the amio to 200 mg a day    4.  Dilated aortic root :  Stable     For questions or updates, please contact [facility] Please consult [email or web address] for contact info under       Signed, Ahmad Alert, MD

## 2024-01-27 NOTE — Assessment & Plan Note (Signed)
 Seems calm at admission  Plan Xanax prn

## 2024-01-27 NOTE — Plan of Care (Signed)
 Pt vital signs and assessment unremarkable. Pt lower extremities however still 3+ edema

## 2024-01-27 NOTE — Assessment & Plan Note (Signed)
 Continue blood pressure control with entreto,

## 2024-01-27 NOTE — Assessment & Plan Note (Addendum)
 Hypokalemia.   Renal function with serum cr at 1,1 with K at 3,7 and serum bicarbonate 25  Na 137 and Mg 2,3   Plan to continue diuresis with torsemide 20 mg daily along with K supplements.  Continue spironolactone 

## 2024-01-27 NOTE — Plan of Care (Signed)
 Patient to discharge home tomorrow.

## 2024-01-27 NOTE — Assessment & Plan Note (Addendum)
Continue rate control with amiodarone and anticoagulation with apixaban.  Continue telemetry monitoring.  

## 2024-01-27 NOTE — TOC Progression Note (Signed)
 Transition of Care [facility]) - Progression Note    Patient Details  Name: Chad Lyons MRN: [id number] Date of Birth: [birth year]  [facility] CM/SW Contact  Jennett Model, RN Phone Number: 01/27/2024, 11:27 AM  Clinical Narrative:    Patient has been started on entresto , Pharmacy to do benefit check for copay amt.        Expected Discharge Plan and Services                                               Social Determinants of Health (SDOH) Interventions SDOH Screenings   Food Insecurity: No Food Insecurity (01/21/2024)  Housing: Low Risk  (01/21/2024)  Transportation Needs: No Transportation Needs (01/21/2024)  Utilities: Not At Risk (01/21/2024)  Alcohol Screen: Low Risk  (01/07/2024)  Depression (PHQ2-9): Low Risk  (01/08/2024)  Financial Resource Strain: Low Risk  (01/07/2024)  Physical Activity: Insufficiently Active (01/07/2024)  Social Connections: Moderately Integrated (01/07/2024)  Stress: No Stress Concern Present (01/07/2024)  Tobacco Use: Medium Risk (01/20/2024)    Readmission Risk Interventions     No data to display

## 2024-01-27 NOTE — Progress Notes (Signed)
   01/26/24 1142  BiPAP/CPAP/SIPAP  BiPAP/CPAP/SIPAP Pt Type Adult  BiPAP/CPAP/SIPAP Resmed  Mask Type Full face mask  Dentures removed? Not applicable  Mask Size Large  Respiratory Rate 18 breaths/min  EPAP 4 cmH2O (CPAP pressure of 4- PT home setting)  FiO2 (%) 21 %  Patient Home Machine No  Patient Home Mask No  Patient Home Tubing No  Auto Titrate No  Device Plugged into RED Power Outlet Yes  BiPAP/CPAP /SiPAP Vitals  Pulse Rate (!) 50  Resp (!) 21  SpO2 100 %  Bilateral Breath Sounds Clear;Diminished  MEWS Score/Color  MEWS Score 2  MEWS Score Color Yellow

## 2024-01-27 NOTE — Telephone Encounter (Signed)
 Patient Product/process development scientist completed.    The patient is insured through [employer]. Patient has [employer], may use a copay card, and/or apply for patient assistance if available.    Ran test claim for Entresto  24-26 mg and the current 30 day co-pay is $65.00.   This test claim was processed through [facility]- copay amounts may vary at other pharmacies due to pharmacy/plan contracts, or as the patient moves through the different stages of their insurance plan.     Morgan Arab, CPHT Pharmacy Technician III Certified Patient Advocate [facility] Patient Advocate Team Direct Number: [phone]  Fax: [phone]

## 2024-01-27 NOTE — Assessment & Plan Note (Addendum)
 Echocardiogram with preserved LV systolic function with EF 50 to 55%, severe LVH, consistent with hypertrophic cardiomyopathy, RV with preserved systolic function, no significant valvular disease.   Patient was placed on IV furosemide  for diuresis, negative fluid balance was achieved, - 10,984 ml, with significant improvement in his symptoms.   Plan to continue diuresis with oral torsemide 20 mg daily. Instructed to take bid in case of volume overload.  Entresto  and spironolactone .  Compression socks at home.

## 2024-01-27 NOTE — Assessment & Plan Note (Signed)
 Calculated BMI 36,9

## 2024-01-28 ENCOUNTER — Other Ambulatory Visit (HOSPITAL_COMMUNITY): Payer: Self-pay

## 2024-01-28 DIAGNOSIS — I480 Paroxysmal atrial fibrillation: Secondary | ICD-10-CM | POA: Diagnosis not present

## 2024-01-28 DIAGNOSIS — I5033 Acute on chronic diastolic (congestive) heart failure: Secondary | ICD-10-CM | POA: Diagnosis not present

## 2024-01-28 DIAGNOSIS — I10 Essential (primary) hypertension: Secondary | ICD-10-CM | POA: Diagnosis not present

## 2024-01-28 DIAGNOSIS — E876 Hypokalemia: Secondary | ICD-10-CM | POA: Diagnosis not present

## 2024-01-28 LAB — BASIC METABOLIC PANEL WITH GFR
Anion gap: 8 (ref 5–15)
BUN: 11 mg/dL (ref 6–20)
CO2: 25 mmol/L (ref 22–32)
Calcium: 8.5 mg/dL — ABNORMAL LOW (ref 8.9–10.3)
Chloride: 104 mmol/L (ref 98–111)
Creatinine, Ser: 1.18 mg/dL (ref 0.61–1.24)
GFR, Estimated: >60 mL/min (ref >60)
Glucose, Bld: 100 mg/dL — ABNORMAL HIGH (ref 70–99)
Potassium: 3.7 mmol/L (ref 3.5–5.1)
Sodium: 137 mmol/L (ref 135–145)

## 2024-01-28 LAB — MAGNESIUM: Magnesium: 2.3 mg/dL (ref 1.7–2.4)

## 2024-01-28 MED ORDER — AMIODARONE HCL 200 MG PO TABS
200.0000 mg | ORAL_TABLET | Freq: Every day | ORAL | 0 refills | Status: DC
  Filled 2024-01-28: qty 30, 30d supply, fill #0

## 2024-01-28 MED ORDER — SACUBITRIL-VALSARTAN 49-51 MG PO TABS
1.0000 | ORAL_TABLET | Freq: Two times a day (BID) | ORAL | 0 refills | Status: DC
  Filled 2024-01-28: qty 60, 30d supply, fill #0

## 2024-01-28 MED ORDER — TORSEMIDE 20 MG PO TABS
20.0000 mg | ORAL_TABLET | Freq: Every day | ORAL | 0 refills | Status: DC
  Filled 2024-01-28: qty 60, 60d supply, fill #0

## 2024-01-28 MED ORDER — POTASSIUM CHLORIDE CRYS ER 20 MEQ PO TBCR
20.0000 meq | EXTENDED_RELEASE_TABLET | Freq: Every day | ORAL | 0 refills | Status: DC
  Filled 2024-01-28: qty 30, 30d supply, fill #0

## 2024-01-28 MED ORDER — SPIRONOLACTONE 25 MG PO TABS
25.0000 mg | ORAL_TABLET | Freq: Every day | ORAL | 0 refills | Status: DC
  Filled 2024-01-28: qty 30, 30d supply, fill #0

## 2024-01-28 MED ORDER — POTASSIUM CHLORIDE CRYS ER 20 MEQ PO TBCR: 20.0000 meq | EXTENDED_RELEASE_TABLET | Freq: Once | ORAL | Status: DC

## 2024-01-28 NOTE — Discharge Summary (Signed)
 Physician Discharge Summary   Patient: Chad Lyons MRN: [id number] DOB: 01/07/1966  Admit date:     01/20/2024  Discharge date: 01/28/24  Discharge Physician: Curlee Doss Miliyah Luper   PCP: Alto Atta, NP   Recommendations at discharge:    Patient will continue diuresis with torsemide 20 mg daily, with instructions to increase to bid in case of volume overload, 2 to 3 lbs weight increase in 24 hrs or 5 lbs in 7 days.  Patient has been placed back on entresto  and spironolactone , Possible addition of SGLT 2 inh as outpatient.  Follow up renal function and electrolytes in 7 days as outpatient Follow up with Cory Nafziger in 7 to 10 days Follow up with Cardiology as scheduled.   Discharge Diagnoses: Principal Problem:   Acute on chronic diastolic CHF (congestive heart failure) (HCC) Active Problems:   Essential hypertension   PAF (paroxysmal atrial fibrillation) (HCC)   Hypokalemia   Obesity, class 2   Anxiety  Resolved Problems:   * No resolved hospital problems. [facility] Course: Chad Lyons was admitted to the hospital with the working diagnosis of heart failure decompensation.   58 y.o. male with past medical history of paroxysmal atrial fibrillation, s/p cardioversion, chronic diastolic CHF, GERD, obstructive sleep apnea and  anxiety, presented to hospital with lower extremity edema, dyspnea and orthopnea. Recent hospitalization 04/10 to 11/27/23 , he had uncontrolled atrial fibrillation, at the time of his discharge, his furosemide  dose was decreased from 120 mg daily to 40 mg as needed, stooped entresto , SGLT 2 inh and spironolactone . Since then he noted worsening edema.  On his initial physical examination his blood pressure was 127/103, HR 62, RR 22 and 02 saturation 94%, Lungs with no wheezing or rhonchi, heart with S1 and S2 present and regular, abdomen with no distention and positive lower extremity edema.   Na 139, K 3,1 Cl 100 bicarbonate 26 glucose 93 bun 13 cr  1,17  BNP 1,170  High sensitive troponin 47 and 46  Wbc 7.7 hgb 12.1 plt 248   Chest radiograph with cardiomegaly, bilateral hilar vascular congestion, bilateral central interstitial infiltrates, fluid in the right fissure and bilateral small pleural effusions.   EKG 57 bpm, left axis deviation, qtc 441, left bundle branch block, sinus rhythm with no significant ST segment or T wave changes.   Patient was placed on IV furosemide  for diuresis.   06/12 improved volume status, patient will continue oral diuretic therapy at home.  Follow up with outpatient heart failure clinic.   Assessment and Plan: * Acute on chronic diastolic CHF (congestive heart failure) (HCC) Echocardiogram with preserved LV systolic function with EF 50 to 55%, severe LVH, consistent with hypertrophic cardiomyopathy, RV with preserved systolic function, no significant valvular disease.   Patient was placed on IV furosemide  for diuresis, negative fluid balance was achieved, - 10,984 ml, with significant improvement in his symptoms.   Plan to continue diuresis with oral torsemide 20 mg daily. Instructed to take bid in case of volume overload.  Entresto  and spironolactone .  Compression socks at home.   Essential hypertension Continue blood pressure control with entreto,   PAF (paroxysmal atrial fibrillation) (HCC) Continue rate control with amiodarone  and anticoagulation with apixaban .   Hypokalemia Hypokalemia.   Renal function with serum cr at 1,1 with K at 3,7 and serum bicarbonate 25  Na 137 and Mg 2,3   Plan to continue diuresis with torsemide 20 mg daily along with K supplements.  Continue spironolactone   Obesity, class 2 Calculated BMI 36,9   Anxiety Continue with alprazolam .      Consultants: cardiology  Procedures performed: none   Disposition: Home Diet recommendation:  Cardiac diet DISCHARGE MEDICATION: Allergies as of 01/28/2024       Reactions   Hydromet [hydrocodone  Bit-homatrop  Mbr] Itching   Omnipaque  [iohexol ] Hives, Itching   Itching and hives develop several hours after injection   Iodine-131 Itching   Penicillins Itching   Red Dye #40 (allura Red) Itching        Medication List     STOP taking these medications    furosemide  40 MG tablet Commonly known as: LASIX        TAKE these medications    acetaminophen  500 MG tablet Commonly known as: TYLENOL  Take 500 mg by mouth as needed for mild pain (pain score 1-3) or moderate pain (pain score 4-6).   ALPRAZolam  0.5 MG tablet Commonly known as: XANAX  Take 0.5 mg by mouth daily as needed for anxiety.   amiodarone  200 MG tablet Commonly known as: PACERONE  Take 1 tablet (200 mg total) by mouth daily. Start taking on: January 29, 2024 What changed: how much to take   apixaban  5 MG Tabs tablet Commonly known as: Eliquis  Take 1 tablet (5 mg total) by mouth 2 (two) times daily.   B COMPLEX PO Take 1 each by mouth in the morning. OTC B-Complex gummy supplement.   benzonatate  200 MG capsule Commonly known as: TESSALON  Take 1 capsule (200 mg total) by mouth 3 (three) times daily as needed.   meloxicam 15 MG tablet Commonly known as: MOBIC Take 15 mg by mouth daily as needed for pain.   Mens Multivitamin Gummies Chew Chew 1 each by mouth daily.   OVER THE COUNTER MEDICATION Take 1 each by mouth in the morning and at bedtime. OTC Keto + ACV gummy supplement   PEPCID  PO Take 2 tablets by mouth as needed.   potassium chloride  SA 20 MEQ tablet Commonly known as: KLOR-CON  M Take 1 tablet (20 mEq total) by mouth daily. Start taking on: January 29, 2024 What changed: when to take this   PRILOSEC OTC PO Take 1 tablet by mouth as needed.   sacubitril -valsartan  49-51 MG Commonly known as: ENTRESTO  Take 1 tablet by mouth 2 (two) times daily.   spironolactone  25 MG tablet Commonly known as: ALDACTONE  Take 1 tablet (25 mg total) by mouth daily. Start taking on: January 29, 2024   torsemide 20 MG  tablet Commonly known as: DEMADEX Take 1 tablet (20 mg total) by mouth daily. In case of weight gain 2 to 3 lbs in 24 hrs or 5 lbs in 7 days, take twice daily until weight back to baseline. Start taking on: January 29, 2024   triamcinolone  cream 0.1 % Commonly known as: KENALOG  APPLY TOPICALLY 2 TIMES DAILY AS NEEDED (RASH).   VITAMIN D -3 PO Take 1 each by mouth daily. OTC Vitamin D3 gummy supplement        Discharge Exam: Filed Weights   01/26/24 0620 01/27/24 0411 01/28/24 0418  Weight: 131.7 kg 130.6 kg 129.5 kg   BP 121/74 (BP Location: Left Arm)   Pulse (!) 44   Temp 98.5 F (36.9 C) (Oral)   Resp 19   Ht 6' 2 (1.88 m)   Wt 129.5 kg   SpO2 100%   BMI 36.66 kg/m   Patient is feeling better, with no chest pain, no dyspnea, no PND or orthopnea, lower extremity edema  has improved.   Neurology awake and alert,  ENT with no pallor or icterus Cardiovascular with S1 and S2 present and regular with no gallops, rubs or murmurs No JVD Trace non pitting lower extremity edema at the ankles Respiratory with no rales or wheezing, no rhonchi  Abdomen with no distention   Condition at discharge: stable  The results of significant diagnostics from this hospitalization (including imaging, microbiology, ancillary and laboratory) are listed below for reference.   Imaging Studies: DG Chest 2 View Result Date: 01/20/2024 CLINICAL DATA:  Shortness of breath and some chest tightness. Bilateral lower extremity edema. 20 lb weight gain over the past 2 weeks. Upper respiratory tract infection with cough and congestion. EXAM: CHEST - 2 VIEW COMPARISON:  11/26/2023 FINDINGS: Enlarged cardiac silhouette with an increase in size. Mild increase in prominence of the pulmonary vasculature and interstitial markings. Interval small bilateral pleural effusions. Thoracolumbar spine degenerative changes. IMPRESSION: Interval development of mild congestive heart failure. Electronically Signed   By: Catherin Closs M.D.   On: 01/20/2024 13:54    Microbiology: Results for orders placed or performed in visit on 07/01/23  Culture, Urine     Status: Abnormal   Collection Time: 07/01/23  4:01 PM   Specimen: Urine  Result Value Ref Range Status   MICRO NUMBER: [id number]  Final   SPECIMEN QUALITY: Adequate  Final   Sample Source URINE  Final   STATUS: FINAL  Final   ISOLATE 1: Escherichia coli (A)  Final    Comment: Greater than 100,000 CFU/mL of Escherichia coli      Susceptibility   Escherichia coli - URINE CULTURE, REFLEX    AMOX/CLAVULANIC >=32 Resistant     AMPICILLIN >=32 Resistant     AMPICILLIN/SULBACTAM 16 Intermediate     CEFAZOLIN* 8 Resistant      * For uncomplicated UTI caused by E. coli, K. pneumoniae or P. mirabilis: Cefazolin is susceptible if MIC <32 mcg/mL and predicts susceptible to the oral agents cefaclor, cefdinir, cefpodoxime, cefprozil, cefuroxime , cephalexin  and loracarbef.     CEFTAZIDIME <=1 Sensitive     CEFEPIME <=1 Sensitive     CEFTRIAXONE <=1 Sensitive     CIPROFLOXACIN  <=0.25 Sensitive     LEVOFLOXACIN  <=0.12 Sensitive     GENTAMICIN <=1 Sensitive     IMIPENEM <=0.25 Sensitive     NITROFURANTOIN <=16 Sensitive     PIP/TAZO <=4 Sensitive     TOBRAMYCIN  <=1 Sensitive     TRIMETH/SULFA* <=20 Sensitive      * For uncomplicated UTI caused by E. coli, K. pneumoniae or P. mirabilis: Cefazolin is susceptible if MIC <32 mcg/mL and predicts susceptible to the oral agents cefaclor, cefdinir, cefpodoxime, cefprozil, cefuroxime , cephalexin  and loracarbef. Legend: S = Susceptible  I = Intermediate R = Resistant  NS = Not susceptible SDD = Susceptible Dose Dependent * = Not Tested  NR = Not Reported **NN = See Therapy Comments     Labs: CBC: Recent Labs  Lab 01/22/24 0247 01/23/24 0241 01/24/24 0306 01/25/24 0305 01/26/24 0244 01/27/24 0230  WBC 6.6 6.7 5.8 6.3 6.5 6.0  NEUTROABS 4.3  --   --   --   --   --   HGB 11.5* 11.7* 11.0* 11.2* 12.6*  12.3*  HCT 36.0* 36.2* 34.5* 35.0* 39.3 38.5*  MCV 98.9 97.6 98.3 98.6 99.0 97.5  PLT 213 224 203 232 254 258   Basic Metabolic Panel: Recent Labs  Lab 01/22/24 0247 01/23/24 0241 01/24/24 0306 01/25/24 0305  01/26/24 0244 01/27/24 0230 01/28/24 0326  NA 140 136 138 138 138 138 137  K 3.5 3.5 3.6 3.5 4.0 3.8 3.7  CL 105 104 102 106 104 104 104  CO2 28 25 26 27 27 27 25   GLUCOSE 105* 102* 104* 102* 108* 108* 100*  BUN 13 14 15 12 13 12 11   CREATININE 1.34* 1.21 1.13 1.16 1.32* 1.20 1.18  CALCIUM 8.7* 8.5* 8.7* 8.6* 8.5* 8.3* 8.5*  MG 1.9 2.0 2.1  --  2.5*  --  2.3   Liver Function Tests: No results for input(s): AST, ALT, ALKPHOS, BILITOT, PROT, ALBUMIN in the last 168 hours. CBG: No results for input(s): GLUCAP in the last 168 hours.  Discharge time spent: greater than 30 minutes.  Signed: Albertus Alt, MD [facility] 01/28/2024

## 2024-01-28 NOTE — Progress Notes (Signed)
  Progress Note  Patient Name: Chad Lyons Date of Encounter: 01/28/2024 [facility] Cardiologist: Bruce Caper  Interval Summary   58 year old gentleman who presents with acute on chronic combined congestive heart failure.  He presents with weight gain, PND, orthopnea, exertional dyspnea.  Hx of HFrEF, OSA, obesity, PAF   We started Entresto  49-51  bid    He has been followed in the congestive heart failure clinic by Dr. Bruce Caper   Echocardiogram from November 27, 2023 reveals slightly improved LVEF of 50 to 55%.  He has severe LVH.  He has grade 1 diastolic dysfunction. Trivial mitral regurgitation  He is near euvolemic today .     Has continued to diurese  I/O are =11 liters so far this admission   He appears to be stable and wants to go home He has an appt with Dr. Julane Ny on June 26   Vital Signs Vitals:   01/27/24 2030 01/27/24 2338 01/28/24 0418 01/28/24 0715  BP: 128/86 121/72 127/83 121/74  Pulse: (!) 47  (!) 44 (!) 44  Resp: 18 18 17 19   Temp: 98 F (36.7 C) 97.6 F (36.4 C) (!) 97.5 F (36.4 C) 98.5 F (36.9 C)  TempSrc: Oral Oral Oral Oral  SpO2: 100% 99% 100% 100%  Weight:   129.5 kg   Height:        Intake/Output Summary (Last 24 hours) at 01/28/2024 0855 Last data filed at 01/28/2024 0818 Gross per 24 hour  Intake 832 ml  Output 1400 ml  Net -568 ml      01/28/2024    4:18 AM 01/27/2024    4:11 AM 01/26/2024    6:20 AM  Last 3 Weights  Weight (lbs) 285 lb 7.9 oz 287 lb 14.7 oz 290 lb 6.4 oz  Weight (kg) 129.5 kg 130.6 kg 131.725 kg      Telemetry/ECG  Sinus bradycardia at a rate of 58.  Personally reviewed.   Physical Exam: Blood pressure 121/74, pulse (!) 44, temperature 98.5 F (36.9 C), temperature source Oral, resp. rate 19, height 6' 2 (1.88 m), weight 129.5 kg, SpO2 100%.       GEN:  middle age, moderately obese male  HEENT: Normal NECK: No JVD; No carotid bruits LYMPHATICS: No lymphadenopathy CARDIAC: RRR    RESPIRATORY:  Clear to auscultation without rales, wheezing or rhonchi  ABDOMEN: Soft, non-tender, non-distended MUSCULOSKELETAL:  No edema; No deformity  SKIN: Warm and dry NEUROLOGIC:  Alert and oriented x 3   Assessment & Plan  Acute on chronic combined systolic and diastolic congestive heart failure: He seems to be making progress.     Is on entresto  49-51 BID Spiro 25 mg a day Torsemide 20 mg a day  Kdur 20 meq a day   We stopped coreg  due to bradycardia   Ok to DC to hom e   2.  Hypertrophic CM :   follows with the Advanced CHF team   3.  PAF:  will reduce the amio to 200 mg a day    4.  Dilated aortic root :  Stable     For questions or updates, please contact [facility] Please consult [email or web address] for contact info under       Signed, Ahmad Alert, MD

## 2024-01-29 ENCOUNTER — Other Ambulatory Visit (HOSPITAL_COMMUNITY): Payer: Self-pay

## 2024-01-29 ENCOUNTER — Telehealth: Payer: Self-pay

## 2024-01-29 NOTE — Transitions of Care (Post Inpatient/ED Visit) (Signed)
   01/29/2024  Name: Chad Lyons MRN: [id number] DOB: 07/15/1966  Today's TOC FU Call Status: Today's TOC FU Call Status:: Unsuccessful Call (1st Attempt) Unsuccessful Call (1st Attempt) Date: 01/29/24  Attempted to reach the patient regarding the most recent Inpatient/ED visit.  Follow Up Plan: Additional outreach attempts will be made to reach the patient to complete the Transitions of Care (Post Inpatient/ED visit) call.   Orpha Blade, RN, BSN, CEN [employer]- Transition of Care Team.  [facility] [phone]

## 2024-02-01 ENCOUNTER — Telehealth: Payer: Self-pay

## 2024-02-01 NOTE — Transitions of Care (Post Inpatient/ED Visit) (Signed)
   02/01/2024  Name: Chad Lyons MRN: [id number] DOB: 1965/09/12  Today's TOC FU Call Status: Today's TOC FU Call Status:: Unsuccessful Call (2nd Attempt) Unsuccessful Call (2nd Attempt) Date: 02/01/24  Attempted to reach the patient regarding the most recent Inpatient/ED visit.  Follow Up Plan: Additional outreach attempts will be made to reach the patient to complete the Transitions of Care (Post Inpatient/ED visit) call.   Orpha Blade, RN, BSN, CEN [employer]- Transition of Care Team.  [facility] [phone]

## 2024-02-02 ENCOUNTER — Telehealth: Payer: Self-pay

## 2024-02-02 NOTE — Transitions of Care (Post Inpatient/ED Visit) (Signed)
   02/02/2024  Name: MORRY VEIGA MRN: [id number] DOB: 1965-09-12  Today's TOC FU Call Status: Today's TOC FU Call Status:: Unsuccessful Call (3rd Attempt) Unsuccessful Call (3rd Attempt) Date: 02/02/24  Attempted to reach the patient regarding the most recent Inpatient/ED visit.  Follow Up Plan: No further outreach attempts will be made at this time. We have been unable to contact the patient.  Orpha Blade, RN, BSN, CEN [employer]- Transition of Care Team.  [facility] [phone]

## 2024-02-11 ENCOUNTER — Other Ambulatory Visit

## 2024-02-11 ENCOUNTER — Ambulatory Visit (HOSPITAL_COMMUNITY)
Admission: RE | Admit: 2024-02-11 | Discharge: 2024-02-11 | Disposition: A | Discharge status: Home / Self Care | Source: Ambulatory Visit | Attending: Internal Medicine | Admitting: Internal Medicine

## 2024-02-11 ENCOUNTER — Other Ambulatory Visit: Payer: Self-pay | Admitting: Adult Health

## 2024-02-11 ENCOUNTER — Encounter (HOSPITAL_COMMUNITY): Payer: Self-pay | Admitting: Internal Medicine

## 2024-02-11 ENCOUNTER — Other Ambulatory Visit (HOSPITAL_COMMUNITY): Payer: Self-pay | Admitting: Internal Medicine

## 2024-02-11 VITALS — BP 136/88 | HR 49 | Ht 74.0 in | Wt 285.2 lb

## 2024-02-11 DIAGNOSIS — R710 Precipitous drop in hematocrit: Secondary | ICD-10-CM

## 2024-02-11 DIAGNOSIS — Z79899 Other long term (current) drug therapy: Secondary | ICD-10-CM | POA: Diagnosis not present

## 2024-02-11 DIAGNOSIS — I77810 Thoracic aortic ectasia: Secondary | ICD-10-CM

## 2024-02-11 DIAGNOSIS — I5022 Chronic systolic (congestive) heart failure: Secondary | ICD-10-CM

## 2024-02-11 DIAGNOSIS — E876 Hypokalemia: Secondary | ICD-10-CM

## 2024-02-11 DIAGNOSIS — I110 Hypertensive heart disease with heart failure: Secondary | ICD-10-CM | POA: Diagnosis not present

## 2024-02-11 DIAGNOSIS — G4733 Obstructive sleep apnea (adult) (pediatric): Secondary | ICD-10-CM | POA: Insufficient documentation

## 2024-02-11 DIAGNOSIS — I5033 Acute on chronic diastolic (congestive) heart failure: Secondary | ICD-10-CM

## 2024-02-11 DIAGNOSIS — E669 Obesity, unspecified: Secondary | ICD-10-CM | POA: Diagnosis not present

## 2024-02-11 DIAGNOSIS — Z9884 Bariatric surgery status: Secondary | ICD-10-CM | POA: Insufficient documentation

## 2024-02-11 DIAGNOSIS — Z7984 Long term (current) use of oral hypoglycemic drugs: Secondary | ICD-10-CM | POA: Insufficient documentation

## 2024-02-11 DIAGNOSIS — I10 Essential (primary) hypertension: Secondary | ICD-10-CM

## 2024-02-11 DIAGNOSIS — I422 Other hypertrophic cardiomyopathy: Secondary | ICD-10-CM | POA: Diagnosis not present

## 2024-02-11 DIAGNOSIS — I4891 Unspecified atrial fibrillation: Secondary | ICD-10-CM | POA: Diagnosis not present

## 2024-02-11 DIAGNOSIS — Z7901 Long term (current) use of anticoagulants: Secondary | ICD-10-CM | POA: Diagnosis not present

## 2024-02-11 DIAGNOSIS — Z6836 Body mass index (BMI) 36.0-36.9, adult: Secondary | ICD-10-CM | POA: Insufficient documentation

## 2024-02-11 DIAGNOSIS — I5032 Chronic diastolic (congestive) heart failure: Secondary | ICD-10-CM | POA: Diagnosis present

## 2024-02-11 DIAGNOSIS — I77819 Aortic ectasia, unspecified site: Secondary | ICD-10-CM | POA: Insufficient documentation

## 2024-02-11 DIAGNOSIS — Z87891 Personal history of nicotine dependence: Secondary | ICD-10-CM | POA: Diagnosis not present

## 2024-02-11 DIAGNOSIS — I480 Paroxysmal atrial fibrillation: Secondary | ICD-10-CM | POA: Insufficient documentation

## 2024-02-11 LAB — CBC
HCT: 42.6 % (ref 39.0–52.0)
Hemoglobin: 14 g/dL (ref 13.0–17.0)
MCHC: 32.8 g/dL (ref 30.0–36.0)
MCV: 93.1 fl (ref 78.0–100.0)
Platelets: 209 10*3/uL (ref 150.0–400.0)
RBC: 4.58 Mil/uL (ref 4.22–5.81)
RDW: 13.8 % (ref 11.5–15.5)
WBC: 5.8 10*3/uL (ref 4.0–10.5)

## 2024-02-11 LAB — BASIC METABOLIC PANEL WITH GFR
BUN: 15 mg/dL (ref 6–23)
CO2: 29 meq/L (ref 19–32)
Calcium: 9.5 mg/dL (ref 8.4–10.5)
Chloride: 100 meq/L (ref 96–112)
Creatinine, Ser: 1.17 mg/dL (ref 0.40–1.50)
GFR: 68.54 mL/min (ref >60.00)
Glucose, Bld: 92 mg/dL (ref 70–99)
Potassium: 3.9 meq/L (ref 3.5–5.1)
Sodium: 140 meq/L (ref 135–145)

## 2024-02-11 LAB — IBC + FERRITIN
Ferritin: 234.1 ng/mL (ref 22.0–322.0)
Iron: 127 ug/dL (ref 42–165)
Saturation Ratios: 37.5 % (ref 20.0–50.0)
TIBC: 338.8 ug/dL (ref 250.0–450.0)
Transferrin: 242 mg/dL (ref 212.0–360.0)

## 2024-02-11 LAB — BRAIN NATRIURETIC PEPTIDE: Pro B Natriuretic peptide (BNP): 326 pg/mL — ABNORMAL HIGH (ref 0.0–100.0)

## 2024-02-11 LAB — VITAMIN B12: Vitamin B-12: 444 pg/mL (ref 211–911)

## 2024-02-11 MED ORDER — DAPAGLIFLOZIN PROPANEDIOL 10 MG PO TABS: 10.0000 mg | ORAL_TABLET | Freq: Every day | ORAL | 5 refills | Status: DC

## 2024-02-11 MED ORDER — ENTRESTO 97-103 MG PO TABS: ORAL_TABLET | ORAL | Status: DC

## 2024-02-11 NOTE — Progress Notes (Signed)
 ADVANCED HF CLINIC NOTE   Primary Care: Merna Huxley, NP HF Cardiologist: Dr. Cherrie  HPI: Chad Lyons is a 58 y.o.with history of HFpEF, diverticulitis, HTN, S/P Lap Band, tobacco abuse, and OSA (uses CPAP), and recently diagnosed hypertrophic cardiomyopath presenting today for post-hospital follow up.   Echo in 2018 EF 50-55%.    Admitted 1/23 with a/c CHF. He was diuresed with IV lasix . Echo showed EF 40-45%, +LVH (Dr. Cherrie read as 50%). cMRI showed LVEF 35%, moderate asymmetric septal hypertrophy, no LVOT, RVEF 42%, findings consistent with hypertrophic cardiomyopathy. He was discharged on GDMT with spiro, Lasix , carvedilol  and Entresto ; discharge weight 325 lbs.  Admitted in [facility] in 3/25 for AF  Admitted in 4/25 with symptomatic atrial fibrillation with RVR. He was hypotensive to the 70s-80s systolic. Patient was emergently cardioverted in the emergency department and loaded with amiodarone . GDMT cut back  Admitted 6/25 with HF.  Echo EF 50-55% severe LVH RV low normal Diuresed 11L  Returns for post-hospital f/u. Feels much better. Breathing back to baseline. Edema well controlled. Taking torsemide  20 dailly. No palpitations. No problems with meds. Walks frequently 10-14K steps/day   FH: mother has Afib, brother has MI and AF  Cardiac Studies:  - CPX (11/23):  Pre-Exercise PFTs  FVC 3.17 (68%)      FEV1 2.42 (67%)        FEV1/FVC 76 (98%)        MVV 119 (75%)   Peak VO2: 16.4 (74% predicted peak VO2)  VE/VCO2 slope:  27  OUES: 2.79  Peak RER: 1.15   - Echo (9/23): EF 40-45%, severe concentric LVH, G1DD, Asc Ao 4.5 cm   - Echo (1/23): EF 40-45%, + LVH  - cMRI (1/23): LVEF 35%, RVEF 42%, study consistent w HCM.  Echo 04/25/22: EF 40-45% Severe concentric LVH G1DD Asc Ao 4.5 cm   Zio 2 week (10/23): mostly NSR, 10 runs of NSVT, 5.4% PVC burden  CPX (10/23): mild to moderate function limitation, primarily due to body habitus. Slope 27  Coronary CTA (11/23):  Coronary calcium score 1 (59th%), non obs CAD, severe asymmetric septal hypertrophy, mild AoV calcifications (AV calcium score 449)  Past Medical History:  Diagnosis Date   Diverticulitis    HX OF   EKG, abnormal    HX OF LEFT ANTERIO FASCICULAR BLOCK ON 02-15-15 EKG   GERD (gastroesophageal reflux disease)    Heart murmur    NOW RESOLVED   Hypertension    OSA (obstructive sleep apnea) 11/27/2016   Ventral incisional hernia    Current Outpatient Medications  Medication Sig Dispense Refill   acetaminophen  (TYLENOL ) 500 MG tablet Take 500 mg by mouth as needed for mild pain (pain score 1-3) or moderate pain (pain score 4-6).     ALPRAZolam  (XANAX ) 0.5 MG tablet Take 0.5 mg by mouth daily as needed for anxiety.     amiodarone  (PACERONE ) 200 MG tablet Take 1 tablet (200 mg total) by mouth daily. 30 tablet 0   apixaban  (ELIQUIS ) 5 MG TABS tablet Take 1 tablet (5 mg total) by mouth 2 (two) times daily. 180 tablet 3   B Complex Vitamins (B COMPLEX PO) Take 1 each by mouth in the morning. OTC B-Complex gummy supplement.     benzonatate  (TESSALON ) 200 MG capsule Take 1 capsule (200 mg total) by mouth 3 (three) times daily as needed. 30 capsule 0   Cholecalciferol (VITAMIN D -3 PO) Take 1 each by mouth daily. OTC Vitamin D3 gummy supplement  Famotidine  (PEPCID  PO) Take 2 tablets by mouth as needed.     meloxicam (MOBIC) 15 MG tablet Take 15 mg by mouth daily as needed for pain.     Multiple Vitamins-Minerals (MENS MULTIVITAMIN GUMMIES) CHEW Chew 1 each by mouth daily.     Omeprazole Magnesium (PRILOSEC OTC PO) Take 1 tablet by mouth as needed.     OVER THE COUNTER MEDICATION Take 1 each by mouth in the morning and at bedtime. OTC Keto + ACV gummy supplement     potassium chloride  SA (KLOR-CON  M) 20 MEQ tablet Take 1 tablet (20 mEq total) by mouth daily. 30 tablet 0   torsemide  (DEMADEX ) 20 MG tablet Take 20 mg by mouth daily.     triamcinolone  cream (KENALOG ) 0.1 % APPLY TOPICALLY 2 TIMES DAILY  AS NEEDED (RASH). 30 g 0   No current facility-administered medications for this encounter.   Allergies  Allergen Reactions   Hydromet [Hydrocodone  Bit-Homatrop Mbr] Itching   Omnipaque  [Iohexol ] Hives and Itching    Itching and hives develop several hours after injection   Iodine-131 Itching   Penicillins Itching   Red Dye #40 (Allura Red) Itching   Social History   Socioeconomic History   Marital status: Married    Spouse name: Aristide Waggle   Number of children: 1   Years of education: Not on file   Highest education level: Master's degree (e.g., MA, MS, MEng, MEd, MSW, MBA)  Occupational History   Occupation: Sales  Tobacco Use   Smoking status: Former    Current packs/day: 0.50    Types: Cigarettes   Smokeless tobacco: Never   Tobacco comments:    Current Cigars 1 every other day -----QUIT cigarettes in 2006>> 1/2PPD x 15 years  Vaping Use   Vaping status: Never Used  Substance and Sexual Activity   Alcohol use: Yes    Alcohol/week: 40.0 standard drinks of alcohol    Types: 40 Shots of liquor per week    Comment: 6-7 shots of liquor daily x 8 years. heavier on weekend   Drug use: No   Sexual activity: Not on file    Comment: still smokes cigars  Other Topics Concern   Not on file  Social History Narrative   Not on file   Social Drivers of Health   Financial Resource Strain: Low Risk  (01/07/2024)   Overall Financial Resource Strain (CARDIA)    Difficulty of Paying Living Expenses: Not hard at all  Food Insecurity: No Food Insecurity (01/21/2024)   Hunger Vital Sign    Worried About Running Out of Food in the Last Year: Never true    Ran Out of Food in the Last Year: Never true  Transportation Needs: No Transportation Needs (01/21/2024)   PRAPARE - Administrator, Civil Service (Medical): No    Lack of Transportation (Non-Medical): No  Physical Activity: Insufficiently Active (01/07/2024)   Exercise Vital Sign    Days of Exercise per Week: 5 days     Minutes of Exercise per Session: 10 min  Stress: No Stress Concern Present (01/07/2024)   [employer] of Occupational Health - Occupational Stress Questionnaire    Feeling of Stress : Only a little  Social Connections: Moderately Integrated (01/07/2024)   Social Connection and Isolation Panel    Frequency of Communication with Friends and Family: More than three times a week    Frequency of Social Gatherings with Friends and Family: More than three times a week  Attends Religious Services: More than 4 times per year    Active Member of Clubs or Organizations: Yes    Attends Banker Meetings: More than 4 times per year    Marital Status: Separated  Intimate Partner Violence: Not At Risk (01/21/2024)   Humiliation, Afraid, Rape, and Kick questionnaire    Fear of Current or Ex-Partner: No    Emotionally Abused: No    Physically Abused: No    Sexually Abused: No   Family History  Problem Relation Age of Onset   Heart disease Mother    High blood pressure Mother    Diabetes Mother    Hypertension Brother    Heart failure Brother    Stroke Brother    Heart disease Maternal Aunt    Cancer Maternal Uncle        pancreatic   Heart disease Maternal Uncle    Rheum arthritis Maternal Grandmother    High blood pressure Maternal Grandmother    Colon cancer Neg Hx    BP 136/88   Pulse (!) 49   Ht 6' 2 (1.88 m)   Wt 129.4 kg (285 lb 3.2 oz)   SpO2 97%   BMI 36.62 kg/m   Wt Readings from Last 3 Encounters:  02/11/24 129.4 kg (285 lb 3.2 oz)  01/28/24 129.5 kg (285 lb 7.9 oz)  01/19/24 130.2 kg (287 lb)   PHYSICAL EXAM: Vitals:   02/11/24 0926  BP: 136/88  Pulse: (!) 49  SpO2: 97%    General:  Well appearing. No resp difficulty HEENT: normal Neck: supple. no JVD. Carotids 2+ bilat; no bruits. No lymphadenopathy or thryomegaly appreciated. Cor: PMI nondisplaced. Regular rate & rhythm. No rubs, gallops or murmurs. Lungs: clear Abdomen: obese soft, nontender,  nondistended. No hepatosplenomegaly. No bruits or masses. Good bowel sounds. Extremities: no cyanosis, clubbing, rash, edema Neuro: alert & orientedx3, cranial nerves grossly intact. moves all 4 extremities w/o difficulty. Affect pleasant  Wt Readings from Last 3 Encounters:  02/11/24 129.4 kg (285 lb 3.2 oz)  01/28/24 129.5 kg (285 lb 7.9 oz)  01/19/24 130.2 kg (287 lb)    ECG (personally reviewed): NSR 63 bpm, IVCD, + LVH w/ PVC   ASSESSMENT & PLAN:  1. Chronic HF recovered EF, with HCM - Echo 2018 EF 50-55% - Echo 09/05/21 EF 40-45% + LVH - cMRI (1/23): LVEF 35%, RVEF 42%, study consistent w HCM.  Septal thickness 2.4 cm  - Echo 04/25/22: EF 40-45% Severe concentric LVH G1DD Asc Ao 4.5 cm No LVOT gradient  - Echo 11/27/23: EF 50-55%, low normal RV function. Severe LVH. Grade I DD.  - CPX (11/23):  VE/VCO2 slope:  27, OUES: 2.79, Peak RER: 1.15; mild functional impairment, primarily limited by body habitus, pre-exercise spiro appears restrictive lung physiology  - Genetic testing for HCM has been sent - Doing well NYHA I. Volume ok on torsemide  20 daily. Can take extra as needed - Restart Farxiga  10 mg daily. - Restart entresto  at 49/51 bid  - Can restart spiro in future as needed  2. AF, paroxysmal -  Onset 2/23. - CHADs-VASC: 2 - s/p DCCV 09/23/21--> NSR. - Admitted 3/25 ([facility]) and 4/25 with AF with RVR -> DC-CV - Remains in NSR Continue amiodarone  200mg  daily - Continue Eliquis  5 mg bid. No bleeding issues - Continue CPAP.  - Refer to EF for ablation  3. Aortic root dilation - 4.5 cm on 9/23 echo.  - CTA coronaries and aortic root (43 mm) -  Stable on recent echo. Continue to follow   4. OSA - Continue CPAP nightly. - Continue with weight loss.    5.  HTN - Improved. Meds as above   6. Obesity  - s/p lap band in 2012. - Body mass index is 36.62 kg/m. - Weight down almost 20 lbs since last visit. Congratulated - Consider GLP1RA   7. Tobacco abuse/ETOH Abuse - Quit  smoking 2/23  - Discussed need to quit ETOH   Toribio Fuel, MD  10:05 AM

## 2024-02-11 NOTE — Patient Instructions (Signed)
 Medication Changes:  RESTART FARXIGA  10MG  ONCE DAILY   DECREASE ENTRESTO  TO 49/51MG  TWICE DAILY- (TAKE 1/2 TABLET OF THE 97/103MG  TABLET TWICE DAILY)  Lab Work:  ORDER GIVEN FOR BNP TO HAVE DONE AT YOUR PRIMARY CARE OFFICE   Referrals:  YOU HAVE BEEN REFERRED TO ELECTROPHYSIOLOGY FOR ABLATION THEY WILL REACH OUT TO YOU OR CALL TO ARRANGE THIS. PLEASE CALL US  WITH ANY CONCERNS   Follow-Up in: 3 MONTHS AS SCHEDULED   At the [facility], you and your health needs are our priority. We have a designated team specialized in the treatment of Heart Failure. This Care Team includes your primary Heart Failure Specialized Cardiologist (physician), Advanced Practice Providers (APPs- Physician Assistants and Nurse Practitioners), and Pharmacist who all work together to provide you with the care you need, when you need it.   You may see any of the following providers on your designated Care Team at your next follow up:  Dr. Toribio Fuel Dr. Ezra Shuck Dr. Ria Commander Dr. Odis Brownie Greig Mosses, NP [place]affie Shed, [place] [facility] [place], [place] Beckey Coe, NP [place] Lee, NP Tinnie Redman, PharmD   Please be sure to bring in all your medications bottles to every appointment.   Need to Contact Us :  If you have any questions or concerns before your next appointment please send us  a message through [place] or call our office at [phone].    TO LEAVE A MESSAGE FOR THE NURSE SELECT OPTION 2, PLEASE LEAVE A MESSAGE INCLUDING: YOUR NAME DATE OF BIRTH CALL BACK NUMBER REASON FOR CALL**this is important as we prioritize the call backs  YOU WILL RECEIVE A CALL BACK THE SAME DAY AS LONG AS YOU CALL BEFORE 4:00 PM

## 2024-02-12 ENCOUNTER — Ambulatory Visit: Payer: Self-pay | Admitting: Adult Health

## 2024-02-16 ENCOUNTER — Telehealth: Payer: Self-pay | Admitting: *Deleted

## 2024-02-16 NOTE — Telephone Encounter (Signed)
 Communication  Reason for CRM: Patient returned call regarding lab results.Message from provider was relayed to patient and patient verbalized understanding.

## 2024-02-17 ENCOUNTER — Telehealth: Payer: Self-pay

## 2024-02-17 NOTE — Telephone Encounter (Signed)
 Copied from CRM [id number]. Topic: Clinical - Medical Advice >> Feb 17, 2024  8:29 AM Thersia BROCKS wrote: Reason for CRM: Patient called in stated he wanted to know if NP Darleene could send him in a zpack to the pharmacy , stated he is having some congestion  [id number] [place], [facility] - [address] [place] [address] Phone: [phone] Fax: [phone]

## 2024-02-17 NOTE — Telephone Encounter (Signed)
 Spoke to pt and advised that Darleene is out of the office until next week. Pt advised to follow up at [facility] since he does not stay out-of-town. Pt declined and stated he will gets some mucinex . Will send to provider as RICK

## 2024-02-23 ENCOUNTER — Ambulatory Visit: Payer: Self-pay

## 2024-02-23 NOTE — Telephone Encounter (Signed)
**Note De-identified  [place] Obfuscation** Please advise 

## 2024-02-23 NOTE — Telephone Encounter (Signed)
 Patient notified of update  and verbalized understanding.

## 2024-02-23 NOTE — Telephone Encounter (Signed)
 FYI Only or Action Required?: Action required by provider: recommendations as patient is out of town in [place].  Patient was last seen in primary care on 01/19/2024 by Merna Huxley, NP.  Called Nurse Triage reporting Back Pain.  Symptoms began a week ago.  Interventions attempted: OTC medications: Tylenol  and Rest, hydration, or home remedies.  Symptoms are: unchanged.  Triage Disposition: See HCP Within 4 Hours (Or PCP Triage)-out of town, requesting recommendations from PCP-if medication can be sent to a pharmacy or if he needs to be seen at a nearby UC/ED  Patient/caregiver understands and will follow disposition?: No, wishes to speak with PCP  Copied from CRM [id number]. Topic: Clinical - Red Word Triage >> Feb 23, 2024  9:25 AM Marissa P wrote: Red Word that prompted transfer to Nurse Triage: Patient is having some pain in his back, left side around rib area and hard to sleep at night. Wake up next morning coughing up congestion, going on for the last week. Was hoping to get a virtual appt booked with doctor asap if possible please. Reason for Disposition  [1] SEVERE back pain (e.g., excruciating, unable to do any normal activities) AND [2] not improved 2 hours after pain medicine  Answer Assessment - Initial Assessment Questions 1. ONSET: When did the pain begin?      Started a couple of months ago but started again in the last week 2. LOCATION: Where does it hurt? (upper, mid or lower back)     Mid back 3. SEVERITY: How bad is the pain?  (e.g., Scale 1-10; mild, moderate, or severe)   - MILD (1-3): Doesn't interfere with normal activities.    - MODERATE (4-7): Interferes with normal activities or awakens from sleep.    - SEVERE (8-10): Excruciating pain, unable to do any normal activities.      8 out of 10 4. PATTERN: Is the pain constant? (e.g., yes, no; constant, intermittent)      Comes and goes-depends on how patient is sitting 5. RADIATION: Does the pain shoot into your  legs or somewhere else?     No radiation 6. CAUSE:  What do you think is causing the back pain?      Patient is unsure-states pain could be a kidney infection 7. BACK OVERUSE:  Any recent lifting of heavy objects, strenuous work or exercise?     no 8. MEDICINES: What have you taken so far for the pain? (e.g., nothing, acetaminophen , NSAIDS)     tylenol  9. NEUROLOGIC SYMPTOMS: Do you have any weakness, numbness, or problems with bowel/bladder control?     no 10. OTHER SYMPTOMS: Do you have any other symptoms? (e.g., fever, abdomen pain, burning with urination, blood in urine)       Cough, congestion, foul smelling urine.   Patient reports being out of town in [place] . Patient initially stated he had concerns for possible URI based on his history but then as we spoke stated that he was diagnosed with a kidney infection a couple of months ago. Patient endorses that he is concerned again due to his urinary symptom. Patient is needing recommendations from PCP-if he needs to be seen at a UC near where he currently is or if medication can be sent to a near by pharmacy. Patient is needing a phone call back  Protocols used: Back Pain-A-AH

## 2024-02-26 ENCOUNTER — Emergency Department (HOSPITAL_COMMUNITY)
Admission: EM | Admit: 2024-02-26 | Discharge: 2024-02-26 | Disposition: A | Discharge status: Home / Self Care | Attending: Emergency Medicine | Admitting: Emergency Medicine

## 2024-02-26 ENCOUNTER — Encounter (HOSPITAL_COMMUNITY): Payer: Self-pay

## 2024-02-26 ENCOUNTER — Emergency Department (HOSPITAL_COMMUNITY)

## 2024-02-26 ENCOUNTER — Other Ambulatory Visit: Payer: Self-pay | Admitting: Nurse Practitioner

## 2024-02-26 ENCOUNTER — Other Ambulatory Visit: Payer: Self-pay

## 2024-02-26 DIAGNOSIS — N179 Acute kidney failure, unspecified: Secondary | ICD-10-CM

## 2024-02-26 DIAGNOSIS — Z79899 Other long term (current) drug therapy: Secondary | ICD-10-CM | POA: Insufficient documentation

## 2024-02-26 DIAGNOSIS — I5030 Unspecified diastolic (congestive) heart failure: Secondary | ICD-10-CM | POA: Diagnosis not present

## 2024-02-26 DIAGNOSIS — S39012A Strain of muscle, fascia and tendon of lower back, initial encounter: Secondary | ICD-10-CM

## 2024-02-26 DIAGNOSIS — Z7901 Long term (current) use of anticoagulants: Secondary | ICD-10-CM | POA: Diagnosis not present

## 2024-02-26 DIAGNOSIS — M549 Dorsalgia, unspecified: Secondary | ICD-10-CM | POA: Diagnosis not present

## 2024-02-26 DIAGNOSIS — I110 Hypertensive heart disease with heart failure: Secondary | ICD-10-CM | POA: Diagnosis not present

## 2024-02-26 DIAGNOSIS — R0602 Shortness of breath: Secondary | ICD-10-CM | POA: Diagnosis present

## 2024-02-26 DIAGNOSIS — I509 Heart failure, unspecified: Secondary | ICD-10-CM

## 2024-02-26 LAB — TROPONIN I (HIGH SENSITIVITY)
Troponin I (High Sensitivity): 27 ng/L — ABNORMAL HIGH (ref <18)
Troponin I (High Sensitivity): 28 ng/L — ABNORMAL HIGH (ref <18)

## 2024-02-26 LAB — CBC
HCT: 40.2 % (ref 39.0–52.0)
Hemoglobin: 13.2 g/dL (ref 13.0–17.0)
MCH: 31.3 pg (ref 26.0–34.0)
MCHC: 32.8 g/dL (ref 30.0–36.0)
MCV: 95.3 fL (ref 80.0–100.0)
Platelets: 199 K/uL (ref 150–400)
RBC: 4.22 MIL/uL (ref 4.22–5.81)
RDW: 13.5 % (ref 11.5–15.5)
WBC: 7.3 K/uL (ref 4.0–10.5)
nRBC: 0 % (ref 0.0–0.2)

## 2024-02-26 LAB — COMPREHENSIVE METABOLIC PANEL WITH GFR
ALT: 17 U/L (ref 0–44)
AST: 27 U/L (ref 15–41)
Albumin: 3.6 g/dL (ref 3.5–5.0)
Alkaline Phosphatase: 47 U/L (ref 38–126)
Anion gap: 11 (ref 5–15)
BUN: 12 mg/dL (ref 6–20)
CO2: 25 mmol/L (ref 22–32)
Calcium: 8.9 mg/dL (ref 8.9–10.3)
Chloride: 102 mmol/L (ref 98–111)
Creatinine, Ser: 1.5 mg/dL — ABNORMAL HIGH (ref 0.61–1.24)
GFR, Estimated: 54 mL/min — ABNORMAL LOW (ref >60)
Glucose, Bld: 145 mg/dL — ABNORMAL HIGH (ref 70–99)
Potassium: 3.6 mmol/L (ref 3.5–5.1)
Sodium: 138 mmol/L (ref 135–145)
Total Bilirubin: 0.6 mg/dL (ref 0.0–1.2)
Total Protein: 7 g/dL (ref 6.5–8.1)

## 2024-02-26 LAB — MAGNESIUM: Magnesium: 2.2 mg/dL (ref 1.7–2.4)

## 2024-02-26 LAB — BRAIN NATRIURETIC PEPTIDE: B Natriuretic Peptide: 826.5 pg/mL — ABNORMAL HIGH (ref 0.0–100.0)

## 2024-02-26 NOTE — ED Provider Notes (Signed)
 [place] EMERGENCY DEPARTMENT AT [facility] Provider Note   CSN: [id number] Arrival date & time: 02/26/24  1131     Patient presents with: Shortness of Breath and Back Pain   Chad Lyons is a 58 y.o. male.   57 year old male with past medical history significant for CHF presents today for concern of volume overload.  He states he was having some back pain and he was recently out of town in [place] .  He went to urgent care there they did a chest x-ray and he received a call today stating that he had fluid around the heart so he returned home and came in for evaluation.  Denies orthopnea but states 2 nights ago he did wake up due to some chest discomfort.  Denies any peripheral edema.  Was recently admitted for IV diuresis.  He states at that time he was transition to a stronger diuretic.  Around July 4 he noticed a 4 pound increase so he increased his diuretic as directed on his prescription.  Currently denies chest pain.  The history is provided by the patient. No language interpreter was used.       Prior to Admission medications   Medication Sig Start Date End Date Taking? Authorizing Provider  acetaminophen  (TYLENOL ) 500 MG tablet Take 500 mg by mouth as needed for mild pain (pain score 1-3) or moderate pain (pain score 4-6).    [provider]  ALPRAZolam  (XANAX ) 0.5 MG tablet Take 0.5 mg by mouth daily as needed for anxiety.    [provider]  amiodarone  (PACERONE ) 200 MG tablet Take 1 tablet (200 mg total) by mouth daily. 01/29/24   Arrien, Elidia Sieving, MD  apixaban  (ELIQUIS ) 5 MG TABS tablet Take 1 tablet (5 mg total) by mouth 2 (two) times daily. 04/06/23   Milford, Harlene HERO, FNP  B Complex Vitamins (B COMPLEX PO) Take 1 each by mouth in the morning. OTC B-Complex gummy supplement.    [provider]  benzonatate  (TESSALON ) 200 MG capsule Take 1 capsule (200 mg total) by mouth 3 (three) times daily as needed. 01/08/24    Nafziger, Darleene, NP  Cholecalciferol (VITAMIN D -3 PO) Take 1 each by mouth daily. OTC Vitamin D3 gummy supplement    [provider]  dapagliflozin  propanediol (FARXIGA ) 10 MG TABS tablet Take 1 tablet (10 mg total) by mouth daily before breakfast. 02/11/24   Bensimhon, Sieving SAUNDERS, MD  Famotidine  (PEPCID  PO) Take 2 tablets by mouth as needed.    [provider]  meloxicam (MOBIC) 15 MG tablet Take 15 mg by mouth daily as needed for pain.    [provider]  Multiple Vitamins-Minerals (MENS MULTIVITAMIN GUMMIES) CHEW Chew 1 each by mouth daily.    [provider]  Omeprazole Magnesium (PRILOSEC OTC PO) Take 1 tablet by mouth as needed.    [provider]  OVER THE COUNTER MEDICATION Take 1 each by mouth in the morning and at bedtime. OTC Keto + ACV gummy supplement    [provider]  potassium chloride  SA (KLOR-CON  M) 20 MEQ tablet Take 1 tablet (20 mEq total) by mouth daily. 01/29/24   Arrien, Mauricio Daniel, MD  sacubitril -valsartan  (ENTRESTO ) 97-103 MG TAKE 1/2 TABLET TWICE DAILY 02/11/24   Bensimhon, Sieving SAUNDERS, MD  torsemide  (DEMADEX ) 20 MG tablet Take 20 mg by mouth daily.    [provider]  triamcinolone  cream (KENALOG ) 0.1 % APPLY TOPICALLY 2 TIMES DAILY AS NEEDED (RASH). 10/01/23  Nafziger, Darleene, NP    Allergies: Hydromet [hydrocodone  bit-homatrop mbr], Omnipaque  [iohexol ], Iodine-131, Penicillins, and Red dye #40 (allura red)    Review of Systems  Constitutional:  Negative for chills and fever.  Respiratory:  Positive for shortness of breath.   Cardiovascular:  Negative for chest pain and leg swelling.  Gastrointestinal:  Negative for abdominal pain.  All other systems reviewed and are negative.   Updated Vital Signs BP (!) 151/88 (BP Location: Right Arm)   Pulse 63   Temp 98.4 F (36.9 C) (Oral)   Resp 18   Ht 6' 2 (1.88 m)   Wt 127.9 kg   SpO2 98%   BMI 36.21 kg/m   Physical Exam Vitals and nursing note  reviewed.  Constitutional:      General: He is not in acute distress.    Appearance: Normal appearance. He is not ill-appearing.  HENT:     Head: Normocephalic and atraumatic.     Nose: Nose normal.  Eyes:     Conjunctiva/sclera: Conjunctivae normal.  Cardiovascular:     Rate and Rhythm: Normal rate and regular rhythm.  Pulmonary:     Effort: Pulmonary effort is normal. No respiratory distress.  Abdominal:     General: There is no distension.     Palpations: Abdomen is soft.     Tenderness: There is no abdominal tenderness. There is no guarding.  Musculoskeletal:        General: No deformity. Normal range of motion.     Cervical back: Normal range of motion.     Right lower leg: No edema.     Left lower leg: No edema.  Skin:    Findings: No rash.  Neurological:     Mental Status: He is alert.     (all labs ordered are listed, but only abnormal results are displayed) Labs Reviewed  COMPREHENSIVE METABOLIC PANEL WITH GFR - Abnormal; Notable for the following components:      Result Value   Glucose, Bld 145 (*)    Creatinine, Ser 1.50 (*)    GFR, Estimated 54 (*)    All other components within normal limits  CBC  BRAIN NATRIURETIC PEPTIDE  MAGNESIUM  TROPONIN I (HIGH SENSITIVITY)    EKG: None  Radiology: DG Chest 1 View Result Date: 02/26/2024 CLINICAL DATA:  Chest pain EXAM: CHEST  1 VIEW COMPARISON:  January 20, 2024 chest radiograph FINDINGS: Pulmonary vascular congestion. Likely linear atelectasis at the left lower lung. No pleural effusions. No pneumothorax. Unchanged cardiomegaly.  No acute osseous findings. IMPRESSION: Pulmonary vascular congestion without overt pulmonary edema. Unchanged cardiomegaly. Electronically Signed   By: Michaeline Blanch M.D.   On: 02/26/2024 12:46     Procedures   Medications Ordered in the ED - No data to display  Clinical Course as of 02/26/24 1759  Fri Feb 26, 2024  1350 DG Chest 1 View [AA]  1645 Patient's workup is overall  reassuring.  BNP mildly elevated at 826 however chest x-ray without obvious pulmonary edema.  Troponin of 27 but denies any chest pain.  Magnesium 2.2.  Repeat troponin pending.  Low suspicion for ACS.  CMP with mild elevation in creatinine at 1.5.  CBC without acute concern. [AA]  1746 Ambulated patient around the unit.  He maintained oxygen  saturation of 100%.  No conversational dyspnea. [AA]  1746 Patient lives in [place] . [AA]  1746 Patient has not establish a cardiologist cardiologist down in [place] .  Has not established a primary care  provider in [place] .  I spoke with our cardiologist Dr. Lonni.  Open patient has mild CHF to see if it would make sense to keep him in the hospital to have repeat labs in the morning prior to discharge or come up with an alternative plan. After speaking with the cardiologist cardiologist recommends doing a [facility] blood draw on Monday.  This will be under her name so she will be able to review the results and call the patient.  Patient is agreeable with this plan. [AA]    Clinical Course User Index [AA] Hildegard Loge, PA-C                                 Medical Decision Making Amount and/or Complexity of Data Reviewed Labs: ordered. Radiology: ordered. Decision-making details documented in ED Course.   Medical Decision Making / ED Course   This patient presents to the ED for concern of volume overload, this involves an extensive number of treatment options, and is a complaint that carries with it a high risk of complications and morbidity.  The differential diagnosis includes CHF exacerbation  MDM: 58 year old male presents today for concern of volume overload.  He had a chest x-ray done which showed increased fluid around his lungs out of town. Will obtain labs, repeat chest x-ray.  Will obtain troponin and EKG as well. No conversational dyspnea. Metabolic panel does show renal insufficiency with creatinine of 1.5 up from  baseline of 1.1.  CBC reassuring.  Chest x-ray does show vascular congestion but no significant pulmonary edema.  Ambulated patient.  No hypoxia.  Discussed with cardiologist Dr. Lonni.  See ED course above. Will increase Demadex  to twice daily for the next 3 days.  Then he can resume his once daily dose.  He will have his repeat labs on Monday.  Patient's back pain is likely muscle strain from recent lifting/moving. Supportive care discussed.  Patient discharged in stable condition.  Return precaution discussed.  Patient voices understanding and is in agreement with plan.   Additional history obtained: -Additional history obtained from chart review -External records from outside source obtained and reviewed including: Chart review including previous notes, labs, imaging, consultation notes   Lab Tests: -I ordered, reviewed, and interpreted labs.   The pertinent results include:   Labs Reviewed  COMPREHENSIVE METABOLIC PANEL WITH GFR - Abnormal; Notable for the following components:      Result Value   Glucose, Bld 145 (*)    Creatinine, Ser 1.50 (*)    GFR, Estimated 54 (*)    All other components within normal limits  CBC  BRAIN NATRIURETIC PEPTIDE  MAGNESIUM  TROPONIN I (HIGH SENSITIVITY)      EKG  EKG Interpretation Date/Time:    Ventricular Rate:    PR Interval:    QRS Duration:    QT Interval:    QTC Calculation:   R Axis:      Text Interpretation:           Imaging Studies ordered: I ordered imaging studies including chest x-ray I independently visualized and interpreted imaging. I agree with the radiologist interpretation   Medicines ordered and prescription drug management: No orders of the defined types were placed in this encounter.   -I have reviewed the patients home medicines and have made adjustments as needed   Social Determinants of Health:  Factors impacting patients care include: Lives in [place]  Reevaluation: After the interventions noted above, I reevaluated the patient and found that they have :stayed the same  Co morbidities that complicate the patient evaluation  Past Medical History:  Diagnosis Date   Diverticulitis    HX OF   EKG, abnormal    HX OF LEFT ANTERIO FASCICULAR BLOCK ON 02-15-15 EKG   GERD (gastroesophageal reflux disease)    Heart murmur    NOW RESOLVED   Hypertension    OSA (obstructive sleep apnea) 11/27/2016   Ventral incisional hernia       Dispostion: Discharged in stable condition.  Return precaution discussed.  Patient voices understanding and is in agreement with plan.    Final diagnoses:  Acute on chronic congestive heart failure, unspecified heart failure type [facility])    ED Discharge Orders          Ordered    Ambulatory referral to Cardiology       Comments: If you have not heard from the Cardiology office within the next 72 hours please call [phone].   02/26/24 1833               Hildegard Loge, PA-C 02/26/24 1833    Charlyn Sora, MD 02/28/24 2156

## 2024-02-26 NOTE — Discharge Instructions (Addendum)
 Your workup showed evidence of mild CHF exacerbation.  No signs of significant fluid on your chest x-ray.  Oxygen  saturation remained normal when walking.  Increase your torsemide  to twice daily for the next 3 days.  After this take once daily as directed.  You can have your lab work performed at any [facility].  Cardiologist will review the results.  Return for any emergent symptoms.  For the back pain take Tylenol  as you need to.  You can take up to 1000 mg every 6 hours.  You can use a lidocaine  patch along with warm compress.

## 2024-02-26 NOTE — ED Notes (Signed)
 CCMD called and notified

## 2024-02-26 NOTE — ED Triage Notes (Addendum)
 Pt to ED via triage c/o back pain that started last week progressively getting worse and now accompanied with [facility] with exertion.    Reports was seen at [facility] yesterday and a chest-xray was performed and he was told fluid around heart pt told to come to ED for further evaluation.

## 2024-03-08 ENCOUNTER — Encounter: Payer: Self-pay | Admitting: Nurse Practitioner

## 2024-03-21 NOTE — Progress Notes (Unsigned)
 Electrophysiology Office Note:   Date:  03/22/2024  ID:  Chad Lyons, DOB [birth year], MRN [id number]  Primary Cardiologist: Candyce Reek, MD Primary Heart Failure: None Electrophysiologist: None      History of Present Illness:   Chad Lyons is a 58 y.o. male with h/o chronic diastolic heart failure, diverticulitis, hypertension, hypertrophic cardiomyopathy seen today for  for Electrophysiology evaluation of atrial fibrillation at the request of Dan Bensimhon.    He was admitted to the hospital in 2023 with CHF.  Ejection fraction was 40 to 45% at the time with LVH.  Cardiac MRI showed ejection fraction of 35% with asymmetric septal hypertrophy.  RVEF was mildly reduced.  He was diagnosed with hypertrophic cardiomyopathy on goal-directed medical therapy.  He was again admitted April 2025 with atrial fibrillation and rapid response.  He was hypotensive he was started on amiodarone  requiring cardioversion.  He was again admitted in 2025 with heart failure and diuresed 11 L.  He has had multiple admissions over the last few months with both heart failure and atrial fibrillation.  He feels quite poor when he is in atrial fibrillation with weakness, fatigue, shortness of breath.  He prefer alternative rhythm control to amiodarone .  Today, denies symptoms of palpitations, chest pain, dyspnea, orthopnea, PND, lower extremity edema, claudication, dizziness, presyncope, syncope, bleeding, or neurologic sequela. The patient is tolerating medications without difficulties.    Review of systems complete and found to be negative unless listed in HPI.   EP Information / Studies Reviewed:    EKG is not ordered today. EKG from 02/26/24 reviewed which showed sinus rhythm        Risk Assessment/Calculations:    CHA2DS2-VASc Score = 2   This indicates a 2.2% annual risk of stroke. The patient's score is based upon: CHF History: 1 HTN History: 1 Diabetes History: 0 Stroke History: 0 Vascular  Disease History: 0 Age Score: 0 Gender Score: 0            Physical Exam:   VS:  BP (!) 140/96 (BP Location: Right Arm, Patient Position: Sitting, Cuff Size: Large)   Pulse (!) 56   Ht 6' 2 (1.88 m)   Wt 292 lb (132.5 kg)   SpO2 98%   BMI 37.49 kg/m    Wt Readings from Last 3 Encounters:  03/22/24 292 lb (132.5 kg)  02/26/24 282 lb (127.9 kg)  02/11/24 285 lb 3.2 oz (129.4 kg)     GEN: Well nourished, well developed in no acute distress NECK: No JVD; No carotid bruits CARDIAC: Regular rate and rhythm, no murmurs, rubs, gallops RESPIRATORY:  Clear to auscultation without rales, wheezing or rhonchi  ABDOMEN: Soft, non-tender, non-distended EXTREMITIES:  No edema; No deformity   ASSESSMENT AND PLAN:    1.  Paroxysmal atrial fibrillation: On amiodarone .  He is continue to have atrial fibrillation despite amiodarone .  Additionally due to his young age, amiodarone  is a less optimal long-term medication.  Due to this, we Meridee Branum plan for ablation.  Risks and benefits have been discussed.  He understands the risks and is agreed to the procedure.  Risk, benefits, and alternatives to EP study and radiofrequency/pulse field ablation for afib were also discussed in detail today. These risks include but are not limited to stroke, bleeding, vascular damage, tamponade, perforation, damage to the esophagus, lungs, and other structures, pulmonary vein stenosis, worsening renal function, and death. The patient understands these risk and wishes to proceed.  We Callie Bunyard therefore proceed with catheter  ablation at the next available time.  Carto, ICE, anesthesia are requested for the procedure.  This patient Casi Westerfeld NOT require CT prior to ablation  2.  Secondary hypercoagulable state: On Eliquis   3.  Systolic heart failure: Ejection fraction has recovered.  He has diastolic issues.  Has hypertrophic cardiomyopathy.  Medical management per primary cardiology.  4.  Obesity: Lifestyle modification encouraged.   Has had lap band surgery.  5.  Obstructive sleep apnea: CPAP compliance encouraged  6.  Hypertension: Mildly elevated but usually well-controlled   Follow up with [facility] as usual post procedure  Signed, Britiney Blahnik Gladis Norton, MD

## 2024-03-22 ENCOUNTER — Other Ambulatory Visit (HOSPITAL_COMMUNITY): Payer: Self-pay | Admitting: *Deleted

## 2024-03-22 ENCOUNTER — Ambulatory Visit: Attending: Cardiovascular Disease | Admitting: Cardiology

## 2024-03-22 ENCOUNTER — Other Ambulatory Visit (HOSPITAL_COMMUNITY): Payer: Self-pay

## 2024-03-22 ENCOUNTER — Encounter: Payer: Self-pay | Admitting: Cardiology

## 2024-03-22 VITALS — BP 140/96 | HR 56 | Ht 74.0 in | Wt 292.0 lb

## 2024-03-22 DIAGNOSIS — I480 Paroxysmal atrial fibrillation: Secondary | ICD-10-CM | POA: Diagnosis not present

## 2024-03-22 DIAGNOSIS — I5022 Chronic systolic (congestive) heart failure: Secondary | ICD-10-CM | POA: Diagnosis not present

## 2024-03-22 DIAGNOSIS — I110 Hypertensive heart disease with heart failure: Secondary | ICD-10-CM | POA: Diagnosis not present

## 2024-03-22 DIAGNOSIS — Z01812 Encounter for preprocedural laboratory examination: Secondary | ICD-10-CM

## 2024-03-22 DIAGNOSIS — Z79899 Other long term (current) drug therapy: Secondary | ICD-10-CM

## 2024-03-22 DIAGNOSIS — I10 Essential (primary) hypertension: Secondary | ICD-10-CM

## 2024-03-22 DIAGNOSIS — G4733 Obstructive sleep apnea (adult) (pediatric): Secondary | ICD-10-CM

## 2024-03-22 DIAGNOSIS — D6869 Other thrombophilia: Secondary | ICD-10-CM

## 2024-03-22 MED ORDER — AMIODARONE HCL 200 MG PO TABS
200.0000 mg | ORAL_TABLET | Freq: Every day | ORAL | 3 refills | Status: AC
Start: 2024-03-22 — End: ?

## 2024-03-22 MED ORDER — AMIODARONE HCL 200 MG PO TABS: 200.0000 mg | ORAL_TABLET | Freq: Every day | ORAL | 6 refills | Status: DC

## 2024-03-22 NOTE — Patient Instructions (Signed)
 Medication Instructions:  Your physician recommends that you continue on your current medications as directed. Please refer to the Current Medication list given to you today.  *If you need a refill on your cardiac medications before your next appointment, please call your pharmacy*   Lab Work: Pre procedure labs -- we will call you to schedule:  BMP & CBC  If you have a lab test that is abnormal and we need to change your treatment, we will call you to review the results -- otherwise no news is good news.    Testing/Procedures: Your physician has recommended that you have an ablation. Catheter ablation is a medical procedure used to treat some cardiac arrhythmias (irregular heartbeats). During catheter ablation, a long, thin, flexible tube is put into a blood vessel in your groin (upper thigh), or neck. This tube is called an ablation catheter. It is then guided to your heart through the blood vessel. Radio frequency waves destroy small areas of heart tissue where abnormal heartbeats may cause an arrhythmia to start.   We will call you to schedule this for October/November   Follow-Up: At [facility], you and your health needs are our priority.  As part of our continuing mission to provide you with exceptional heart care, we have created designated Provider Care Teams.  These Care Teams include your primary Cardiologist (physician) and Advanced Practice Providers (APPs -  Physician Assistants and Nurse Practitioners) who all work together to provide you with the care you need, when you need it.  Your next appointment:   1 month(s) after your ablation  The format for your next appointment:   In Person  Provider:   [facility] clinic   Thank you for choosing [facility]!!   Maeola Domino, RN [phone]    Other Instructions   Cardiac Ablation Cardiac ablation is a procedure to destroy (ablate) some heart tissue that is sending bad signals. These bad signals cause problems in  heart rhythm. The heart has many areas that make these signals. If there are problems in these areas, they can make the heart beat in a way that is not normal. Destroying some tissues can help make the heart rhythm normal. Tell your doctor about: Any allergies you have. All medicines you are taking. These include vitamins, herbs, eye drops, creams, and over-the-counter medicines. Any problems you or family members have had with medicines that make you fall asleep (anesthetics). Any blood disorders you have. Any surgeries you have had. Any medical conditions you have, such as kidney failure. Whether you are pregnant or may be pregnant. What are the risks? This is a safe procedure. But problems may occur, including: Infection. Bruising and bleeding. Bleeding into the chest. Stroke or blood clots. Damage to nearby areas of your body. Allergies to medicines or dyes. The need for a pacemaker if the normal system is damaged. Failure of the procedure to treat the problem. What happens before the procedure? Medicines Ask your doctor about: Changing or stopping your normal medicines. This is important. Taking aspirin  and ibuprofen . Do not take these medicines unless your doctor tells you to take them. Taking other medicines, vitamins, herbs, and supplements. General instructions Follow instructions from your doctor about what you cannot eat or drink. Plan to have someone take you home from the hospital or clinic. If you will be going home right after the procedure, plan to have someone with you for 24 hours. Ask your doctor what steps will be taken to prevent infection. What  happens during the procedure?  An IV tube will be put into one of your veins. You will be given a medicine to help you relax. The skin on your neck or groin will be numbed. A cut (incision) will be made in your neck or groin. A needle will be put through your cut and into a large vein. A tube (catheter) will be put  into the needle. The tube will be moved to your heart. Dye may be put through the tube. This helps your doctor see your heart. Small devices (electrodes) on the tube will send out signals. A type of energy will be used to destroy some heart tissue. The tube will be taken out. Pressure will be held on your cut. This helps stop bleeding. A bandage will be put over your cut. The exact procedure may vary among doctors and hospitals. What happens after the procedure? You will be watched until you leave the hospital or clinic. This includes checking your heart rate, breathing rate, oxygen , and blood pressure. Your cut will be watched for bleeding. You will need to lie still for a few hours. Do not drive for 24 hours or as long as your doctor tells you. Summary Cardiac ablation is a procedure to destroy some heart tissue. This is done to treat heart rhythm problems. Tell your doctor about any medical conditions you may have. Tell him or her about all medicines you are taking to treat them. This is a safe procedure. But problems may occur. These include infection, bruising, bleeding, and damage to nearby areas of your body. Follow what your doctor tells you about food and drink. You may also be told to change or stop some of your medicines. After the procedure, do not drive for 24 hours or as long as your doctor tells you. This information is not intended to replace advice given to you by your health care provider. Make sure you discuss any questions you have with your health care provider. Document Revised: 10/25/2021 Document Reviewed: 07/07/2019 Elsevier Patient Education  2023 [employer]   Cardiac Ablation, Care After  This sheet gives you information about how to care for yourself after your procedure. Your health care provider may also give you more specific instructions. If you have problems or questions, contact your health care provider. What can I expect after the procedure? After  the procedure, it is common to have: Bruising around your puncture site. Tenderness around your puncture site. Skipped heartbeats. If you had an atrial fibrillation ablation, you may have atrial fibrillation during the first several months after your procedure.  Tiredness (fatigue).  Follow these instructions at home: Puncture site care  Follow instructions from your health care provider about how to take care of your puncture site. Make sure you: If present, leave stitches (sutures), skin glue, or adhesive strips in place. These skin closures may need to stay in place for up to 2 weeks. If adhesive strip edges start to loosen and curl up, you may trim the loose edges. Do not remove adhesive strips completely unless your health care provider tells you to do that. If a large square bandage is present, this may be removed 24 hours after surgery.  Check your puncture site every day for signs of infection. Check for: Redness, swelling, or pain. Fluid or blood. If your puncture site starts to bleed, lie down on your back, apply firm pressure to the area, and contact your health care provider. Warmth. Pus or a bad smell. A  pea or small marble sized lump at the site is normal and can take up to three months to resolve.  Driving Do not drive for at least 4 days after your procedure or however long your health care provider recommends. (Do not resume driving if you have previously been instructed not to drive for other health reasons.) Do not drive or use heavy machinery while taking prescription pain medicine. Activity Avoid activities that take a lot of effort for at least 7 days after your procedure. Do not lift anything that is heavier than 5 lb (4.5 kg) for one week.  No sexual activity for 1 week.  Return to your normal activities as told by your health care provider. Ask your health care provider what activities are safe for you. General instructions Take over-the-counter and prescription  medicines only as told by your health care provider. Do not use any products that contain nicotine or tobacco, such as cigarettes and e-cigarettes. If you need help quitting, ask your health care provider. You may shower after 24 hours, but Do not take baths, swim, or use a hot tub for 1 week.  Do not drink alcohol for 24 hours after your procedure. Keep all follow-up visits as told by your health care provider. This is important. Contact a health care provider if: You have redness, mild swelling, or pain around your puncture site. You have fluid or blood coming from your puncture site that stops after applying firm pressure to the area. Your puncture site feels warm to the touch. You have pus or a bad smell coming from your puncture site. You have a fever. You have chest pain or discomfort that spreads to your neck, jaw, or arm. You have chest pain that is worse with lying on your back or taking a deep breath. You are sweating a lot. You feel nauseous. You have a fast or irregular heartbeat. You have shortness of breath. You are dizzy or light-headed and feel the need to lie down. You have pain or numbness in the arm or leg closest to your puncture site. Get help right away if: Your puncture site suddenly swells. Your puncture site is bleeding and the bleeding does not stop after applying firm pressure to the area. These symptoms may represent a serious problem that is an emergency. Do not wait to see if the symptoms will go away. Get medical help right away. Call your local emergency services (911 in the [place] Do not drive yourself to the hospital. Summary After the procedure, it is normal to have bruising and tenderness at the puncture site in your groin, neck, or forearm. Check your puncture site every day for signs of infection. Get help right away if your puncture site is bleeding and the bleeding does not stop after applying firm pressure to the area. This is a medical  emergency. This information is not intended to replace advice given to you by your health care provider. Make sure you discuss any questions you have with your health care provider.

## 2024-03-22 NOTE — Addendum Note (Signed)
 Addended by: GRETEL MAEOLA CROME on: 03/22/2024 09:26 AM   Modules accepted: Orders

## 2024-03-23 ENCOUNTER — Other Ambulatory Visit (HOSPITAL_COMMUNITY): Payer: Self-pay | Admitting: Family Medicine

## 2024-03-30 ENCOUNTER — Other Ambulatory Visit (HOSPITAL_COMMUNITY): Payer: Self-pay

## 2024-03-30 ENCOUNTER — Telehealth: Payer: Self-pay

## 2024-03-30 MED ORDER — TORSEMIDE 20 MG PO TABS: 20.0000 mg | ORAL_TABLET | Freq: Every day | ORAL | 6 refills | Status: AC

## 2024-03-30 NOTE — Telephone Encounter (Signed)
 Left message for patient to call back and schedule an ablation with Dr. Deno

## 2024-04-05 NOTE — Telephone Encounter (Signed)
 Pt calling to schedule an ablation

## 2024-04-06 NOTE — Telephone Encounter (Signed)
 LM for pt to call back to schedule ablation - gave him my direct number to call.

## 2024-04-15 ENCOUNTER — Other Ambulatory Visit (HOSPITAL_COMMUNITY): Payer: Self-pay

## 2024-04-15 ENCOUNTER — Other Ambulatory Visit (HOSPITAL_COMMUNITY): Payer: Self-pay | Admitting: Family Medicine

## 2024-04-15 MED ORDER — SACUBITRIL-VALSARTAN 49-51 MG PO TABS: 1.0000 | ORAL_TABLET | Freq: Two times a day (BID) | ORAL | 6 refills | Status: AC

## 2024-04-19 NOTE — Telephone Encounter (Signed)
 Pt returning call

## 2024-04-21 NOTE — Telephone Encounter (Signed)
 LM for pt to call back - Also sent MyChart message.

## 2024-04-22 NOTE — Telephone Encounter (Signed)
 Pt responded to MyChart message - he is scheduled for Afib Ablation with Dr. Inocencio on 11/14 at 1000 am.   He is aware of needing updated labs prior to procedure.   Instruction letter will be sent via MyChart and mailed once completed.

## 2024-04-23 ENCOUNTER — Other Ambulatory Visit (HOSPITAL_COMMUNITY): Payer: Self-pay | Admitting: Internal Medicine

## 2024-04-25 ENCOUNTER — Telehealth (HOSPITAL_COMMUNITY): Payer: Self-pay | Admitting: Vascular Surgery

## 2024-04-25 NOTE — Telephone Encounter (Signed)
 3RD attempt to move 9/26 appt w/ db.

## 2024-05-13 ENCOUNTER — Encounter (HOSPITAL_COMMUNITY): Admitting: Internal Medicine

## 2024-06-03 ENCOUNTER — Telehealth: Payer: Self-pay

## 2024-06-03 ENCOUNTER — Encounter (HOSPITAL_COMMUNITY): Admitting: Internal Medicine

## 2024-06-03 NOTE — Telephone Encounter (Signed)
-----   Message from Stanly DELENA Leavens sent at 06/03/2024  3:01 PM EDT ----- Regarding: Moving Patient Hey Team,  Hadn't heard back yet- I have a meeting at [facility] and need to move this patient.  I can see him at 11 AM, 4 PM or if neither works for him let me know and we will add him on on a day convenient to him.  Still hadn't heard back yet from last week so I wanted to check in!  Thanks, MAC

## 2024-06-03 NOTE — Telephone Encounter (Signed)
 Left message for patient to callback to reschedule appt time with Dr. Santo on 06/07/24.

## 2024-06-07 ENCOUNTER — Ambulatory Visit: Admitting: Internal Medicine

## 2024-06-07 ENCOUNTER — Ambulatory Visit: Admitting: Genetic Counselor

## 2024-06-07 ENCOUNTER — Ambulatory Visit: Attending: Internal Medicine | Admitting: Internal Medicine

## 2024-06-07 VITALS — BP 146/94 | HR 53 | Ht 73.0 in | Wt 303.0 lb

## 2024-06-07 DIAGNOSIS — I272 Pulmonary hypertension, unspecified: Secondary | ICD-10-CM | POA: Diagnosis not present

## 2024-06-07 DIAGNOSIS — I422 Other hypertrophic cardiomyopathy: Secondary | ICD-10-CM

## 2024-06-07 DIAGNOSIS — I480 Paroxysmal atrial fibrillation: Secondary | ICD-10-CM

## 2024-06-07 NOTE — Patient Instructions (Addendum)
 Medication Instructions:  Your physician recommends that you continue on your current medications as directed. Please refer to the Current Medication list given to you today.  *If you need a refill on your cardiac medications before your next appointment, please call your pharmacy*  Lab Work: NONE  If you have labs (blood work) drawn today and your tests are completely normal, you will receive your results only by: MyChart Message (if you have MyChart) OR A paper copy in the mail If you have any lab test that is abnormal or we need to change your treatment, we will call you to review the results.  Testing/Procedures: Your physician has requested that you have a cardiac MRI. Cardiac MRI uses a computer to create images of your heart as its beating, producing both still and moving pictures of your heart and major blood vessels. For further information please visit [email or web address]. Please follow the instruction sheet given to you today for more information.   Follow-Up: At [facility], you and your health needs are our priority.  As part of our continuing mission to provide you with exceptional heart care, our providers are all part of one team.  This team includes your primary Cardiologist (physician) and Advanced Practice Providers or APPs (Physician Assistants and Nurse Practitioners) who all work together to provide you with the care you need, when you need it.  Your next appointment:   4 month(s)  Provider:   Stanly Leavens, MD or Orren Fabry, PA-C        Other Instructions   You are scheduled for Cardiac MRI at the location below.  Please arrive for your appointment at ______________ . ?  [facility] [address] [place], [place] [address] Please take advantage of the free valet parking available at the [facility] and [employer] (Entrance C).  Proceed to the [facility] Radiology Department (First Floor) for check-in.   OR   [facility] [address] [place], [place] [address] Please go to the [facility] and check-in with the desk attendant.   Magnetic resonance imaging (MRI) is a painless test that produces images of the inside of the body without using Xrays.  During an MRI, strong magnets and radio waves work together in a Data processing manager to form detailed images.   MRI images may provide more details about a medical condition than X-rays, CT scans, and ultrasounds can provide.  You may be given earphones to listen for instructions.  You may eat a light breakfast and take medications as ordered with the exception of furosemide , torsemide , hydrochlorothiazide , chlorthalidone or spironolactone  (or any other fluid pill). If you are undergoing a stress MRI, please avoid stimulants for 12 hr prior to test. (I.e. Caffeine, nicotine, chocolate, or antihistamine medications)  If your provider has ordered anti-anxiety medications for this test, then you will need a driver.  An IV will be inserted into one of your veins. Contrast material will be injected into your IV. It will leave your body through your urine within a day. You may be told to drink plenty of fluids to help flush the contrast material out of your system.  You will be asked to remove all metal, including: Watch, jewelry, and other metal objects including hearing aids, hair pieces and dentures. Also wearable glucose monitoring systems (ie. Freestyle Libre and Omnipods) (Braces and fillings normally are not a problem.)   TEST WILL TAKE APPROXIMATELY 1 HOUR  PLEASE NOTIFY SCHEDULING AT LEAST 24 HOURS  IN ADVANCE IF YOU ARE UNABLE TO KEEP YOUR APPOINTMENT. [phone]  For more information and frequently asked questions, please visit our website : [email or web address]  Please call the Cardiac Imaging Nurse Navigators with any questions/concerns. [phone] Office

## 2024-06-07 NOTE — Progress Notes (Signed)
 Cardiology Office Note:  .    Date:  06/07/2024  ID:  Chad Lyons, DOB [birth year], MRN [id number] PCP: Merna Huxley, NP  [facility] Providers Cardiologist:  Candyce Reek, MD     CC: HCM f/u   History of Present Illness: .    Chad Lyons is a 58 y.o. male with hypertrophic cardiomyopathy and atrial fibrillation who presents for follow-up regarding his cardiac condition and pending ablation.  He has a history of hypertrophic cardiomyopathy, specifically non-obstructive with a reverse curve and a maximal septal thickness of 28 mm. His left ventricular ejection fraction has improved to 50-55% after previously being lower. A past cardiac MRI showed a late gadolinium enhancement burden greater than 15%.  He has a history of atrial fibrillation with rapid ventricular response and is scheduled for a cardiac ablation on November 14th. He has experienced hypotension associated with atrial fibrillation episodes, leading to adjustments in his medication regimen. He is currently on amiodarone . No chest pain, breathing issues, or syncope. He denies any family history of sudden cardiac death.  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 Additionally, he has mild aortic dilation and moderate aortic valve calcification with an AV calcium score of 449. He has minimal non-obstructive coronary artery disease.  His past medical history includes a lap band procedure in 2012 for weight management, and he currently has a BMI of 36. He reports a history of weight fluctuations but is currently on a downward trend.  Family history includes a mother and brother with atrial fibrillation, but no known history of hypertrophic cardiomyopathy. His brother has experienced heart failure and underwent open-heart surgery and cardiac ablation earlier this year.  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 He has an adopted daughter who recently won a  soccer game in [place] . He reports social alcohol use and no smoking.  Discussed the use of AI scribe software for clinical note transcription with the patient, who gave verbal consent to proceed.  Relevant histories: .  Social  - Tobacco: Denies smoking - Alcohol: Social use; advised to cut back due to AFib. 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   Studies Reviewed: .     Cardiac Studies & Procedures   ______________________________________________________________________________________________     ECHOCARDIOGRAM  ECHOCARDIOGRAM COMPLETE 11/27/2023  Narrative ECHOCARDIOGRAM REPORT    Patient Name:   Chad Lyons Date of Exam: 11/27/2023 Medical Rec #:  [id number]     Height:       74.0 in Accession #:    [id number]    Weight:       279.1 lb Date of Birth:  30-Dec-1965     BSA:          2.504 m Patient Age:    57 years      BP:           105/80 mmHg Patient Gender: M             HR:           51 bpm. Exam Location:  Inpatient  Procedure: 2D Echo, Cardiac Doppler and Color Doppler (Both Spectral and Color Flow Doppler were utilized during procedure).  Indications:    CHF I50.9  History:  Patient has prior history of Echocardiogram examinations, most recent 05/04/2023. CHF and Hypertrophic Cardiomyopathy, Pulmonary HTN, Arrythmias:Atrial Fibrillation; Risk Factors:Sleep Apnea.  Sonographer:    Thea Norlander RCS Referring Phys: 71 BRITTAINY M SIMMONS  IMPRESSIONS   1. Left ventricular ejection fraction, by estimation, is 50 to 55%. The left ventricle has low normal function. The left ventricle has no regional wall motion abnormalities. There is severe concentric left ventricular hypertrophy consistent with hypertrophic cardiomyopathy. Left ventricular diastolic  parameters are consistent with Grade I diastolic dysfunction (impaired relaxation). 2. Right ventricular systolic function is low normal. The right ventricular size is mildly enlarged. 3. The mitral valve is normal in structure. Trivial mitral valve regurgitation. No evidence of mitral stenosis. 4. The aortic valve is normal in structure. Aortic valve regurgitation is mild. No aortic stenosis is present. 5. The inferior vena cava is normal in size with greater than 50% respiratory variability, suggesting right atrial pressure of 3 mmHg.  FINDINGS Left Ventricle: Left ventricular ejection fraction, by estimation, is 50 to 55%. The left ventricle has low normal function. The left ventricle has no regional wall motion abnormalities. The left ventricular internal cavity size was normal in size. There is severe concentric left ventricular hypertrophy. Left ventricular diastolic parameters are consistent with Grade I diastolic dysfunction (impaired relaxation).  Right Ventricle: The right ventricular size is mildly enlarged. No increase in right ventricular wall thickness. Right ventricular systolic function is low normal.  Left Atrium: Left atrial size was normal in size.  Right Atrium: Right atrial size was normal in size.  Pericardium: There is no evidence of pericardial effusion.  Mitral Valve: The mitral valve is normal in structure. Trivial mitral valve regurgitation. No evidence of mitral valve stenosis.  Tricuspid Valve: The tricuspid valve is normal in structure. Tricuspid valve regurgitation is not demonstrated. No evidence of tricuspid stenosis.  Aortic Valve: The aortic valve is normal in structure. Aortic valve regurgitation is mild. No aortic stenosis is present. Aortic valve peak gradient measures 6.0 mmHg.  Pulmonic Valve: The pulmonic valve was normal in structure. Pulmonic valve regurgitation is not visualized. No evidence of pulmonic stenosis.  Aorta: The aortic root is normal  in size and structure.  Venous: The inferior vena cava is normal in size with greater than 50% respiratory variability, suggesting right atrial pressure of 3 mmHg.  IAS/Shunts: No atrial level shunt detected by color flow Doppler.   LEFT VENTRICLE PLAX 2D LVIDd:         5.20 cm      Diastology LVIDs:         3.90 cm      LV e' medial:    4.13 cm/s LV PW:         1.40 cm      LV E/e' medial:  11.4 LV IVS:        1.80 cm      LV e' lateral:   5.45 cm/s LVOT diam:     2.50 cm      LV E/e' lateral: 8.6 LV SV:         93 LV SV Index:   37 LVOT Area:     4.91 cm  LV Volumes (MOD) LV vol d, MOD A2C: 94.4 ml LV vol d, MOD A4C: 124.0 ml LV vol s, MOD A2C: 63.1 ml LV vol s, MOD A4C: 61.4 ml LV SV MOD A2C:     31.3 ml LV SV MOD A4C:     124.0 ml LV SV MOD  BP:      54.5 ml  RIGHT VENTRICLE             IVC RV S prime:     25.00 cm/s  IVC diam: 1.90 cm TAPSE (M-mode): 2.6 cm  LEFT ATRIUM             Index        RIGHT ATRIUM           Index LA diam:        4.80 cm 1.92 cm/m   RA Area:     17.20 cm LA Vol (A2C):   64.5 ml 25.76 ml/m  RA Volume:   39.30 ml  15.69 ml/m LA Vol (A4C):   74.4 ml 29.71 ml/m LA Biplane Vol: 75.8 ml 30.27 ml/m AORTIC VALVE AV Area (Vmax): 3.97 cm AV Vmax:        122.50 cm/s AV Peak Grad:   6.0 mmHg LVOT Vmax:      99.10 cm/s LVOT Vmean:     60.300 cm/s LVOT VTI:       0.190 m  AORTA Ao Root diam: 4.20 cm  MITRAL VALVE MV Area (PHT): 2.76 cm    SHUNTS MV Decel Time: 275 msec    Systemic VTI:  0.19 m MV E velocity: 46.90 cm/s  Systemic Diam: 2.50 cm MV A velocity: 57.20 cm/s MV E/A ratio:  0.82  Aditya Sabharwal Electronically signed by Ria Commander Signature Date/Time: 11/27/2023/5:25:51 PM    Final    MONITORS  LONG TERM MONITOR (3-14 DAYS) 05/20/2022  Narrative Patch Wear Time:  13 days and 18 hours (2023-09-08T11:14:35-0400 to 2023-09-22T05:35:02-399)  1. Sinus rhythm -  avg HR of 67 bpm. 2. Ten runs of nonsustained  Ventricular Tachycardia occurred. The run with the fastest interval lasted 10 beats with a max rate of 240 bpm, the longest lasting 20 beats with an avg rate of 184 bpm. 3. Ten runs of Supraventricular Tachycardia occurred, the run with the fastest interval lasting 4 beats with a max rate of 138 bpm, the longest lasting 15 beats with an avg rate of 114 bpm. 4. Isolated PVCs were frequent (5.4%, 69900), PVCs couplets 1.7% (There was one predominant morphology = 5.4%) 5. Ventricular Bigeminy and Trigeminy were present.  Toribio Fuel, MD 1:35 PM   CT SCANS  CT CORONARY MORPH W/CTA COR W/SCORE 06/27/2022  Addendum 06/27/2022  1:37 PM ADDENDUM REPORT: 06/27/2022 13:34  EXAM: OVER-READ INTERPRETATION  CT CHEST  The following report is an over-read performed by radiologist Dr. Jackquline [facility] on 06/27/2022. This over-read does not include interpretation of cardiac or coronary anatomy or pathology. The CT interpretation by the cardiologist is attached.  COMPARISON:  None.  FINDINGS: Limited view of the lung parenchyma demonstrates no suspicious nodularity. Airways are normal.  Limited view of the mediastinum demonstrates no adenopathy. Esophagus normal.  Limited view of the upper abdomen unremarkable.  Limited view of the skeleton and chest wall is unremarkable.  IMPRESSION: No significant extracardiac findings.   Electronically Signed By: Jackquline Boxer M.D. On: 06/27/2022 13:34  Narrative CLINICAL DATA:  45M with HCM, HFrEF  EXAM: Cardiac/Coronary CTA  TECHNIQUE: The patient was scanned on a [employer].  FINDINGS: A 100 kV prospective scan was triggered in the descending thoracic aorta at 111 HU's. Axial non-contrast 3 mm slices were carried out through the heart. The data set was analyzed on a dedicated work station and scored using the Agatson method. Gantry rotation speed  was 250 msecs and collimation was .6 mm. No  beta blockade and 0.8 mg of sl NTG was given. The 3D data set was reconstructed in 5% intervals of the 35-75% of the R-R cycle. Phases were analyzed on a dedicated work station using MPR, MIP and VRT modes. The patient received 100 cc of contrast.  Coronary Arteries:  Normal coronary origin.  Left dominance.  RCA is a small nondominant artery.  There is no plaque.  Left main is a large artery that gives rise to LAD and LCX arteries.  LAD is a large vessel. Calcified plaque in mid LAD causes 0-24% stenosis. Noncalcified plaque in D2 causes 0-24% stenosis. Noncalcified plaque in D3 causes 0-24% stenosis.  LCX is a dominant artery that gives rise to one large OM1 branch. Noncalcified plaque in proximal OM1 causes 0-24% stenosis  Other findings:  Left Ventricle: Normal size. Severe asymmetric septal hypertrophy consistent with known HCM  Left Atrium: Moderate enlargement  Pulmonary Veins: Normal configuration  Right Ventricle: Normal size  Right Atrium: Normal size  Cardiac valves: Mild AV calcifications (AV calcium score 449)  Thoracic aorta: Dilated ascending aorta measuring 43mm  Pulmonary Arteries: Severely dilated main PA measuring 43mm  Systemic Veins: Normal drainage  Pericardium: Normal thickness  IMPRESSION: 1. Coronary calcium score of 1. This was 59th percentile for age and sex matched control.  2.  Normal coronary origin with left dominance.  3.  Nonobstructive CAD  4. Calcified plaque in mid LAD and noncalcified plaque in proximal OM1, D2, and D3 causes minimal (0-24%) stenosis  5.  Severely dilated main pulmonary artery measuring 43mm  6. Severe asymmetric septal hypertrophy consistent with known hypertrophic cardiomyopathy  7.  Mild aortic valve calcifications (AV calcium score 449)  8.  Dilated ascending aorta measuring 43mm  CAD-RADS 1. Minimal non-obstructive CAD (0-24%). Consider non-atherosclerotic causes of chest pain. Consider  preventive therapy and risk factor modification.  Electronically Signed: By: Lonni Nanas M.D. On: 06/27/2022 13:02   CARDIAC MRI  MR CARDIAC MORPHOLOGY W WO CONTRAST 09/06/2021  Narrative CLINICAL DATA:  Clinical question of hypertrophic cardiomyopathy Study assumes HCT of 44 and BSA of 2.76.  EXAM: CARDIAC MRI  TECHNIQUE: The patient was scanned on a 1.5 Tesla GE magnet. A dedicated cardiac coil was used. Functional imaging was done using Fiest[employer] sequences. 2,3, and 4 chamber views were done to assess for RWMA's. Modified Simpson's rule using a short axis stack was used to calculate an ejection fraction on a dedicated work [employer]. The patient received 10 cc of Gadavist . After 10 minutes inversion recovery sequences were used to assess for infiltration and scar tissue.  CONTRAST:  10 cc  of Gadavist   FINDINGS: 1. Moderately increase left ventricular size, with LVEDD 69 mm, and LVEDVi 124 mL/m2.  Moderate asymmetric septal hypertrophy, with intraventricular septal thickness of 24 mm, posterior wall thickness of 8 mm, and myocardial mass index of 116 g/m2.  Moderately reduced left ventricular systolic function (LVEF = 35%). Septal hypokinesis. No evidence of LVOT obstruction.  Left ventricular parametric mapping notable for very high ECG signal in the septum (base, mid, and apical anteroseptal- 63%- 85%). Normal T2 signal.  There is late gadolinium enhancement in the left ventricular myocardium: Transmural septal LGE in the base, mid, and apical septum. LGE takes up greater than 15% of total myocardium.  2. Normal right ventricular size with RVEDVI 62 mL/m2.  Normal right ventricular thickness.  Mildly decreased right ventricular systolic function (RVEF =42%).  There are no regional wall motion abnormalities or aneurysms.  3.  Normal right atrial size moderate left atrial enlargement.  4.  Ascending aortic dilation 43  mm.  Pulmonary artery dilation 49 mm just distal to the pulmonary valve.  5. Valve assessment:  Aortic Valve: Tri-leaflet aortic valve. Trivial aortic regurgitation.  Pulmonic Valve: Qualitatively no significant regurgitation.  Tricuspid Valve: Qualitatively no significant regurgitation.  Mitral Valve: No mitral regurgitation.  6.  Normal pericardium.  No pericardial effusion.  7. Grossly, no extracardiac findings. Recommended dedicated study if concerned for non-cardiac pathology.  IMPRESSION: Study is consistent with hypertrophic cardiomyopathy.  There is scar and decrease in LV function.  Stanly Leavens MD   Electronically Signed By: Stanly Leavens M.D. On: 09/08/2021 15:16   ______________________________________________________________________________________________      Physical Exam:    VS:  BP (!) 146/94   Pulse (!) 53   Ht 6' 1 (1.854 m)   Wt (!) 303 lb (137.4 kg)   SpO2 100%   BMI 39.98 kg/m    Wt Readings from Last 3 Encounters:  06/07/24 (!) 303 lb (137.4 kg)  03/22/24 292 lb (132.5 kg)  02/26/24 282 lb (127.9 kg)    Gen: no distress   Neck: No JVD Cardiac: No Rubs or Gallops, Systolic Murmur, regular bradycardia, +2 radial pulses Respiratory: Clear to auscultation bilaterally, normal effort, normal  respiratory rate GI: Soft, nontender, non-distended  MS: No  edema;  moves all extremities Integument: Skin feels warm Neuro:  At time of evaluation, alert and oriented to person/place/time/situation  Psych: Normal affect, patient feels ok   ASSESSMENT AND PLAN: .    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  Family history Reviewed, Discussed family screening  - will see genetics; his brother may not benefit greatly from testing  SCD  Assessment - hx of HFrEF and LGE with elevated burden - SCD risk estimated to be at least 3.7% at 5 years SDM: we have discussed CRT-D abased on CMR results  Atrial fibrillation Assessment  - HCM-AF score NA (AF Pending ablation - Persistent Atrial fibrillation with hypotension, status post planned ablation Atrial fibrillation with episodes of hypotension, scheduled for cardiac ablation on November 14th. Potential for post-ablation atrial fibrillation and plan to manage blood pressure without increasing Entresto  dose until after the procedure. - Proceed with cardiac ablation on November 14th. - Monitor blood pressure and adjust medications post-ablation as needed.  Medication symptom plan - Hypertrophic nonobstructive cardiomyopathy with improved ejection fraction to 50-55%, maximal septal thickness of 24-28 mm, no left ventricular outflow tract obstruction or apical aneurysm, and scar burden greater than 15% on cardiac MRI. Risk of sudden cardiac death and abnormal heart rhythms slightly higher than 3.7% in five years. - Consider primary prevention defibrillator based on future assessments of heart function and scar burden. - Genetic Therapy discussion: discussed that he may in the future be a candidate for gene therapy - discussed Mediterranean diet and goal of BMI < 35; in the future with LVEF < 50% we will get repeat CPET; he in his lifetime could need OHT eval and BMI could be prohibitive  Obesity, status post laparoscopic gastric banding Obesity with a BMI of 36, status post laparoscopic gastric banding. Goal to reduce BMI below 35 by the end of 2026.  Winter f/u with me more my team  Time Spent Directly with Patient:   I have spent a total of 62 minutes with the patient reviewing notes, imaging,  EKGs, labs,  and examining the patient as well as establishing an assessment and plan that was discussed personally with the patient. Discussed disease state education , using shared decision making tools and cardiac modeling , and reviewed his LVEF decrease in the setting . Reviewed care and plan in collaboration with genetics    Stanly Leavens, MD FASE [facility] Cardiologist [facility]  [address] [place], [address] [place], [place] [address] [phone]  2:09 PM

## 2024-06-10 ENCOUNTER — Telehealth (HOSPITAL_COMMUNITY): Payer: Self-pay

## 2024-06-10 ENCOUNTER — Encounter (HOSPITAL_COMMUNITY): Payer: Self-pay

## 2024-06-10 ENCOUNTER — Other Ambulatory Visit (HOSPITAL_COMMUNITY): Payer: Self-pay

## 2024-06-10 DIAGNOSIS — I480 Paroxysmal atrial fibrillation: Secondary | ICD-10-CM

## 2024-06-10 DIAGNOSIS — Z01812 Encounter for preprocedural laboratory examination: Secondary | ICD-10-CM

## 2024-06-10 NOTE — Telephone Encounter (Signed)
 Attempted to reach patient to discuss upcoming procedure, no answer. Left VM for patient to return call.

## 2024-06-13 ENCOUNTER — Telehealth (HOSPITAL_COMMUNITY): Payer: Self-pay

## 2024-06-13 NOTE — Telephone Encounter (Signed)
 Called to confirm/remind patient of their appointment [facility] on 06/14/24 8:30.   Appointment:   [x] Confirmed  [] Left mess   [] No answer/No voice mail  [] VM Full/unable to leave message  [] Phone not in service  Patient reminded to bring all medications and/or complete list.  Confirmed patient has transportation. Gave directions, instructed to utilize valet parking.

## 2024-06-13 NOTE — Progress Notes (Signed)
 ADVANCED HF CLINIC NOTE   Primary Care: Merna Huxley, NP HF Cardiologist: Dr. Cherrie Cardiologist: Candyce Reek, MD   HPI: Chad Lyons is a 58 y.o.with history of HFpEF, diverticulitis, HTN, S/P Lap Band, tobacco abuse, and OSA (uses CPAP), and hypertrophic cardiomyopathy (followed by Dr. Santo.    Echo in 2018 EF 50-55%.    Admitted 1/23 with a/c CHF. He was diuresed with IV lasix . Echo showed EF 40-45%, +LVH (Dr. Cherrie read as 50%). cMRI showed LVEF 35%, moderate asymmetric septal hypertrophy, no LVOT, RVEF 42%, findings consistent with hypertrophic cardiomyopathy. He was discharged on GDMT with spiro, Lasix , carvedilol  and Entresto ; discharge weight 325 lbs.  Admitted in [facility] in 3/25 for AF  Admitted in 4/25 with symptomatic atrial fibrillation with RVR. He was hypotensive to the 70s-80s systolic. Patient was emergently cardioverted in the emergency department and loaded with amiodarone . GDMT cut back  Admitted 6/25 with HF.  Echo EF 50-55% severe LVH RV low normal Diuresed 11L  Today he returns for AHF follow up with his friend. Overall feeling good. Denies palpitations, CP, dizziness, edema, or PND/Orthopnea. SOB rarely, last had some SOB with activity several weeks ago. Appetite ok. No fever or chills. Weight at home 295 pounds, does not weight daily (weight from 3 weeks ago). Taking all medications. Denies  tobacco or drug use. Drinks several mixed drinks in the afternoons. SBP at home 120s.   Cardiac Studies: - CPX (11/23):  Pre-Exercise PFTs  FVC 3.17 (68%)      FEV1 2.42 (67%)        FEV1/FVC 76 (98%)        MVV 119 (75%)   Peak VO2: 16.4 (74% predicted peak VO2)  VE/VCO2 slope:  27  OUES: 2.79  Peak RER: 1.15   - Echo (9/23): EF 40-45%, severe concentric LVH, G1DD, Asc Ao 4.5 cm   - Echo (1/23): EF 40-45%, + LVH  - cMRI (1/23): LVEF 35%, RVEF 42%, study consistent w HCM.  Echo 04/25/22: EF 40-45% Severe concentric LVH G1DD Asc Ao 4.5 cm   Zio  2 week (10/23): mostly NSR, 10 runs of NSVT, 5.4% PVC burden  CPX (10/23): mild to moderate function limitation, primarily due to body habitus. Slope 27  Coronary CTA (11/23): Coronary calcium score 1 (59th%), non obs CAD, severe asymmetric septal hypertrophy, mild AoV calcifications (AV calcium score 449)  Past Medical History:  Diagnosis Date   Diverticulitis    HX OF   EKG, abnormal    HX OF LEFT ANTERIO FASCICULAR BLOCK ON 02-15-15 EKG   GERD (gastroesophageal reflux disease)    Heart murmur    NOW RESOLVED   Hypertension    OSA (obstructive sleep apnea) 11/27/2016   Ventral incisional hernia    Current Outpatient Medications  Medication Sig Dispense Refill   acetaminophen  (TYLENOL ) 500 MG tablet Take 500 mg by mouth as needed for mild pain (pain score 1-3) or moderate pain (pain score 4-6).     ALPRAZolam  (XANAX ) 0.5 MG tablet Take 0.5 mg by mouth daily as needed for anxiety.     amiodarone  (PACERONE ) 200 MG tablet Take 1 tablet (200 mg total) by mouth daily. 90 tablet 3   B Complex Vitamins (B COMPLEX PO) Take 1 each by mouth in the morning. OTC B-Complex gummy supplement.     benzonatate  (TESSALON ) 200 MG capsule Take 1 capsule (200 mg total) by mouth 3 (three) times daily as needed. 30 capsule 0   Cholecalciferol (VITAMIN D -3 PO)  Take 1 each by mouth daily. OTC Vitamin D3 gummy supplement     dapagliflozin  propanediol (FARXIGA ) 10 MG TABS tablet Take 1 tablet (10 mg total) by mouth daily before breakfast. 30 tablet 5   ELIQUIS  5 MG TABS tablet TAKE 1 TABLET BY MOUTH TWICE A DAY 180 tablet 3   Famotidine  (PEPCID  PO) Take 2 tablets by mouth as needed.     furosemide  (LASIX ) 40 MG tablet Take 40 mg by mouth 3 (three) times daily.     meloxicam (MOBIC) 15 MG tablet Take 15 mg by mouth daily as needed for pain.     Multiple Vitamins-Minerals (MENS MULTIVITAMIN GUMMIES) CHEW Chew 1 each by mouth daily.     Omeprazole Magnesium (PRILOSEC OTC PO) Take 1 tablet by mouth as needed.      OVER THE COUNTER MEDICATION Take 1 each by mouth in the morning and at bedtime. OTC Keto + ACV gummy supplement     potassium chloride  SA (KLOR-CON  M) 20 MEQ tablet TAKE 1 TABLET BY MOUTH TWICE A DAY 180 tablet 3   sacubitril -valsartan  (ENTRESTO ) 49-51 MG Take 1 tablet by mouth 2 (two) times daily. 60 tablet 6   torsemide  (DEMADEX ) 20 MG tablet Take 1 tablet (20 mg total) by mouth daily. 30 tablet 6   triamcinolone  cream (KENALOG ) 0.1 % APPLY TOPICALLY 2 TIMES DAILY AS NEEDED (RASH). 30 g 0   No current facility-administered medications for this encounter.   Allergies  Allergen Reactions   Hydromet [Hydrocodone  Bit-Homatrop Mbr] Itching   Omnipaque  [Iohexol ] Hives and Itching    Itching and hives develop several hours after injection   Iodine-131 Itching   Penicillins Itching   Red Dye #40 (Allura Red) Itching   Social History   Socioeconomic History   Marital status: Married    Spouse name: Terron Merfeld   Number of children: 1   Years of education: Not on file   Highest education level: Master's degree (e.g., MA, MS, MEng, MEd, MSW, MBA)  Occupational History   Occupation: Sales  Tobacco Use   Smoking status: Former    Current packs/day: 0.50    Types: Cigarettes   Smokeless tobacco: Never   Tobacco comments:    Current Cigars 1 every other day -----QUIT cigarettes in 2006>> 1/2PPD x 15 years  Vaping Use   Vaping status: Never Used  Substance and Sexual Activity   Alcohol use: Yes    Alcohol/week: 40.0 standard drinks of alcohol    Types: 40 Shots of liquor per week    Comment: 6-7 shots of liquor daily x 8 years. heavier on weekend   Drug use: No   Sexual activity: Not on file    Comment: still smokes cigars  Other Topics Concern   Not on file  Social History Narrative   Not on file   Social Drivers of Health   Financial Resource Strain: Low Risk  (01/07/2024)   Overall Financial Resource Strain (CARDIA)    Difficulty of Paying Living Expenses: Not hard at all   Food Insecurity: No Food Insecurity (01/21/2024)   Hunger Vital Sign    Worried About Running Out of Food in the Last Year: Never true    Ran Out of Food in the Last Year: Never true  Transportation Needs: No Transportation Needs (01/21/2024)   PRAPARE - Administrator, Civil Service (Medical): No    Lack of Transportation (Non-Medical): No  Physical Activity: Insufficiently Active (01/07/2024)   Exercise Vital Sign  Days of Exercise per Week: 5 days    Minutes of Exercise per Session: 10 min  Stress: No Stress Concern Present (01/07/2024)   [employer] of Occupational Health - Occupational Stress Questionnaire    Feeling of Stress : Only a little  Social Connections: Moderately Integrated (01/07/2024)   Social Connection and Isolation Panel    Frequency of Communication with Friends and Family: More than three times a week    Frequency of Social Gatherings with Friends and Family: More than three times a week    Attends Religious Services: More than 4 times per year    Active Member of [employer] or Organizations: Yes    Attends Banker Meetings: More than 4 times per year    Marital Status: Separated  Intimate Partner Violence: Not At Risk (01/21/2024)   Humiliation, Afraid, Rape, and Kick questionnaire    Fear of Current or Ex-Partner: No    Emotionally Abused: No    Physically Abused: No    Sexually Abused: No   Family History  Problem Relation Age of Onset   Heart disease Mother    High blood pressure Mother    Diabetes Mother    Hypertension Brother    Heart failure Brother    Stroke Brother    Heart disease Maternal Aunt    Cancer Maternal Uncle        pancreatic   Heart disease Maternal Uncle    Rheum arthritis Maternal Grandmother    High blood pressure Maternal Grandmother    Colon cancer Neg Hx   FH: mother has Afib, brother has MI and AF  BP 126/82   Pulse (!) 57   Ht 6' 1 (1.854 m)   Wt (!) 136.3 kg (300 lb 6.4 oz)   SpO2 98%   BMI  39.63 kg/m   Wt Readings from Last 3 Encounters:  06/14/24 (!) 136.3 kg (300 lb 6.4 oz)  06/07/24 (!) 137.4 kg (303 lb)  03/22/24 132.5 kg (292 lb)   PHYSICAL EXAM: Vitals:   06/14/24 0841  BP: 126/82  Pulse: (!) 57  SpO2: 98%   General:  well appearing.  No respiratory difficulty. Walked into clinic.  Neck: JVD flat.  Cor: Regular rate & rhythm. No murmurs. Lungs: clear Extremities: no edema  Neuro: alert & oriented x 3. Affect pleasant.   Wt Readings from Last 3 Encounters:  06/14/24 (!) 136.3 kg (300 lb 6.4 oz)  06/07/24 (!) 137.4 kg (303 lb)  03/22/24 132.5 kg (292 lb)   ReDs reading: 30 %, normal   ECG (personally reviewed from 03/01/24): NSR 57 bpm, LBBB   ASSESSMENT & PLAN:  1. Chronic HF recovered EF, with HCM - Echo 2018 EF 50-55% - Echo 09/05/21 EF 40-45% + LVH - cMRI (1/23): LVEF 35%, RVEF 42%, study consistent w HCM.  Septal thickness 2.4 cm  - Echo 04/25/22: EF 40-45% Severe concentric LVH G1DD Asc Ao 4.5 cm No LVOT gradient  - Echo 11/27/23: EF 50-55%, low normal RV function. Severe LVH. Grade I DD.  - CPX (11/23):  VE/VCO2 slope:  27, OUES: 2.79, Peak RER: 1.15; mild functional impairment, primarily limited by body habitus, pre-exercise spiro appears restrictive lung physiology  - Genetic testing for HCM has been sent, findings indeterminate. Has been seen by Dr. Fairy and Dr. Santo. Plan for repeat cMRI.  - Doing well NYHA I. Volume ok on torsemide  20 daily. Can take extra as needed. (Will d/c lasix  order in his chart, has  not been taking) - Continue Farxiga  10 mg daily. Denies GU symptoms.  - Continue entresto  at 49/51 bid  - Can restart spiro in future as needed   2. AF, paroxysmal - Onset 2/23. - CHADs-VASC: 2 - s/p DCCV 09/23/21--> NSR. - Admitted 3/25 ([place]) and 4/25 with AF with RVR -> DC-CV - Remains in NSR Continue amiodarone  200mg  daily - Continue Eliquis  5 mg bid. No bleeding issues - Continue CPAP.  - Scheduled for ablation 07/01/24.    3. Aortic root dilation - 4.5 cm on 9/23 echo.  - CTA coronaries and aortic root (43 mm) - Stable on recent echo. Continue to follow   4. OSA - Continue CPAP nightly. - Continue with weight loss.    5.  HTN - Improved. Meds as above   6. Obesity  - s/p lap band in 2012. - Body mass index is 39.63 kg/m. - Weight trending back up, has been eating more recently.  - Refer to PharmD for GLP1RA    7. Tobacco abuse/ETOH Abuse - Quit smoking 2/23  - Discussed need to quit ETOH again today.   Follow up in 3 months with Dr. Bensimhon.   Beckey LITTIE Coe, NP  8:59 AM

## 2024-06-14 ENCOUNTER — Ambulatory Visit (HOSPITAL_COMMUNITY)
Admission: RE | Admit: 2024-06-14 | Discharge: 2024-06-14 | Disposition: A | Discharge status: Home / Self Care | Source: Ambulatory Visit | Attending: Internal Medicine | Admitting: Internal Medicine

## 2024-06-14 ENCOUNTER — Encounter (HOSPITAL_COMMUNITY): Payer: Self-pay

## 2024-06-14 ENCOUNTER — Ambulatory Visit (HOSPITAL_COMMUNITY): Payer: Self-pay | Admitting: Internal Medicine

## 2024-06-14 VITALS — BP 126/82 | HR 57 | Ht 73.0 in | Wt 300.4 lb

## 2024-06-14 DIAGNOSIS — I110 Hypertensive heart disease with heart failure: Secondary | ICD-10-CM | POA: Diagnosis not present

## 2024-06-14 DIAGNOSIS — Z7984 Long term (current) use of oral hypoglycemic drugs: Secondary | ICD-10-CM | POA: Diagnosis not present

## 2024-06-14 DIAGNOSIS — G4733 Obstructive sleep apnea (adult) (pediatric): Secondary | ICD-10-CM | POA: Diagnosis not present

## 2024-06-14 DIAGNOSIS — Z7901 Long term (current) use of anticoagulants: Secondary | ICD-10-CM | POA: Insufficient documentation

## 2024-06-14 DIAGNOSIS — I77810 Thoracic aortic ectasia: Secondary | ICD-10-CM | POA: Diagnosis not present

## 2024-06-14 DIAGNOSIS — I480 Paroxysmal atrial fibrillation: Secondary | ICD-10-CM | POA: Insufficient documentation

## 2024-06-14 DIAGNOSIS — I5032 Chronic diastolic (congestive) heart failure: Secondary | ICD-10-CM | POA: Diagnosis present

## 2024-06-14 DIAGNOSIS — E669 Obesity, unspecified: Secondary | ICD-10-CM | POA: Insufficient documentation

## 2024-06-14 DIAGNOSIS — I422 Other hypertrophic cardiomyopathy: Secondary | ICD-10-CM | POA: Diagnosis not present

## 2024-06-14 DIAGNOSIS — F1010 Alcohol abuse, uncomplicated: Secondary | ICD-10-CM | POA: Insufficient documentation

## 2024-06-14 DIAGNOSIS — Z87891 Personal history of nicotine dependence: Secondary | ICD-10-CM | POA: Diagnosis not present

## 2024-06-14 DIAGNOSIS — Z79899 Other long term (current) drug therapy: Secondary | ICD-10-CM | POA: Insufficient documentation

## 2024-06-14 DIAGNOSIS — I10 Essential (primary) hypertension: Secondary | ICD-10-CM

## 2024-06-14 DIAGNOSIS — Z6839 Body mass index (BMI) 39.0-39.9, adult: Secondary | ICD-10-CM | POA: Insufficient documentation

## 2024-06-14 LAB — BASIC METABOLIC PANEL WITH GFR
Anion gap: 9 (ref 5–15)
BUN: 15 mg/dL (ref 6–20)
CO2: 24 mmol/L (ref 22–32)
Calcium: 8.6 mg/dL — ABNORMAL LOW (ref 8.9–10.3)
Chloride: 106 mmol/L (ref 98–111)
Creatinine, Ser: 1.21 mg/dL (ref 0.61–1.24)
GFR, Estimated: >60 mL/min (ref >60)
Glucose, Bld: 78 mg/dL (ref 70–99)
Potassium: 4.3 mmol/L (ref 3.5–5.1)
Sodium: 139 mmol/L (ref 135–145)

## 2024-06-14 LAB — CBC
HCT: 44.9 % (ref 39.0–52.0)
Hemoglobin: 14.7 g/dL (ref 13.0–17.0)
MCH: 30.5 pg (ref 26.0–34.0)
MCHC: 32.7 g/dL (ref 30.0–36.0)
MCV: 93.2 fL (ref 80.0–100.0)
Platelets: 198 K/uL (ref 150–400)
RBC: 4.82 MIL/uL (ref 4.22–5.81)
RDW: 14.6 % (ref 11.5–15.5)
WBC: 6.2 K/uL (ref 4.0–10.5)
nRBC: 0 % (ref 0.0–0.2)

## 2024-06-14 LAB — BRAIN NATRIURETIC PEPTIDE: B Natriuretic Peptide: 295.3 pg/mL — ABNORMAL HIGH (ref 0.0–100.0)

## 2024-06-14 NOTE — Telephone Encounter (Signed)
 Spoke with patient to complete pre-procedure call.     Health status review:  Any new medical conditions, recent signs of acute illness or been started on antibiotics? No Any recent hospitalizations or surgeries? No Any new medications started since pre-op visit? No  Follow all medication instructions prior to procedure or the procedure may be rescheduled:    Continue taking Eliquis  (Apixaban ) twice daily without missing any doses before procedure. Essential chronic medications:  No medication should be continued, unless told otherwise.  HOLD: Dapagliflozin  (Farxiga ) for 3 days prior to the procedure. Last dose on Monday, November 10.  On the morning of your procedure DO NOT take any medication., including Eliquis  (Apixaban ).  Nothing to eat or drink after midnight prior to your procedure.  Pre-procedure testing scheduled: CT not required and lab work on October 28.  Confirmed patient is scheduled for Atrial Fibrillation Ablation on Friday, November 14 with Dr. Dr. Inocencio. Instructed patient to arrive at the Main Entrance A at [facility]: [address] [place], [place] [address] and check in at Admitting at 7:30 AM.  Plan to go home the same day, you will only stay overnight if medically necessary.. You MUST have a responsible adult to drive you home and MUST be with you the first 24 hours after you arrive home or your procedure could be cancelled.  Informed patient a nurse will call a day before the procedure to confirm arrival time and ensure instructions are followed.  Patient verbalized understanding to information provided and is agreeable to proceed with procedure.   Advised patient to contact RN Navigator at [phone], to inform of any new medications started after call or concerns prior to procedure.

## 2024-06-14 NOTE — Progress Notes (Signed)
 ReDS Vest / Clip - 06/14/24 0841       ReDS Vest / Clip   Station Marker D    Ruler Value 47    ReDS Value Range Low volume    ReDS Actual Value 30

## 2024-06-14 NOTE — Patient Instructions (Signed)
 Medication Changes:  STOP LASIX  (FUROSEMIDE )   Lab Work:  Labs done today, your results will be available in MyChart, we will contact you for abnormal readings.  Referrals:  YOU HAVE BEEN REFERRED TO PHARMD FOR GLP-1 THEY WILL REACH OUT TO YOU OR CALL TO ARRANGE THIS. PLEASE CALL US  WITH ANY CONCERNS   Follow-Up in: 3 MONTHS WITH DR. CHERRIE PLEASE CALL OUR OFFICE AROUND DECEMBER TO GET SCHEDULED FOR YOUR APPOINTMENT. PHONE NUMBER IS [phone] OPTION 2   At the [facility], you and your health needs are our priority. We have a designated team specialized in the treatment of Heart Failure. This Care Team includes your primary Heart Failure Specialized Cardiologist (physician), Advanced Practice Providers (APPs- Physician Assistants and Nurse Practitioners), and Pharmacist who all work together to provide you with the care you need, when you need it.   You may see any of the following providers on your designated Care Team at your next follow up:  Dr. Toribio Cherrie Dr. Ezra Shuck Dr. Odis Brownie Greig Mosses, NP Caffie Shed, [place] [facility] [place], [place] Beckey Coe, NP Jordan Lee, NP Tinnie Redman, PharmD   Please be sure to bring in all your medications bottles to every appointment.   Need to Contact Us :  If you have any questions or concerns before your next appointment please send us  a message through [place] or call our office at [phone].    TO LEAVE A MESSAGE FOR THE NURSE SELECT OPTION 2, PLEASE LEAVE A MESSAGE INCLUDING: YOUR NAME DATE OF BIRTH CALL BACK NUMBER REASON FOR CALL**this is important as we prioritize the call backs  YOU WILL RECEIVE A CALL BACK THE SAME DAY AS LONG AS YOU CALL BEFORE 4:00 PM

## 2024-06-15 NOTE — Progress Notes (Unsigned)
 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   His cardiac MRI demonstrated moderate asymmetric septal hypertrophy and LVEF 35%. Currently being treated with GDMT.   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   Family history Maxamus does not have biological children- his adopted daughter is now 66 years of age. He has a maternal half-brother, age 46, with CAD - had a 3 vessel CABG after having a M.I., stroke and 3 months ago underwent a cardio-ablation for Afib.   Parents are living- father is 16 with no cardiac issues and mother is 13 with Afib- she had a ablation at 75.  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 Computational algorithms do not have high scores for a pathogenic effect on protein function or gene structure. However, this alone is insufficient to determine pathogenicity of this variant. Additional studies, including in vitro functional assays or family studies to determine if the variant segregates with disease are required to confirm the pathogenicity of this variant. In the absence of this information, the MYBPC3  c.1633C>A, p.Leu545Met variant is considered a variant of unknown significance.   As this is an autosomal dominant condition, his first-degree relatives are at a 50% risk of inheriting HCM. However, he does not have biological children that would inherit this condition, nor full siblings. His maternal half brother has CAD and is followed by a cardiologist.  In addition, he can undergo cardiac screening for HCM that involves echocardiogram and EKG at regular intervals, frequency is typically determined by age, with those in their teens undergoing screening every year and those over the age of 40 getting screened every 3-5 years.   Also explained to him, that this variant may get reclassified, in the next few years as additional data becomes available, to either a benign or pathogenic variant. Informed him that it is not recommended to test family members for a VUS,  In addition, we discussed the protections afforded by the Genetic Information Non-Discrimination Act (GINA). I explained to the patient that GINA protects them from losing their employment or health insurance based on their genotype. However, these protections do not cover life insurance and disability.   Please note that the patient has not been counseled in this visit on other personal, cultural, or ethical issues that he may face due to his heart condition.     Danford Pac, Ph.D, [facility] Clinical Molecular Geneticist

## 2024-06-27 ENCOUNTER — Encounter: Payer: Self-pay | Admitting: Emergency Medicine

## 2024-06-30 NOTE — Pre-Procedure Instructions (Signed)
 Instructed patient on the following items: Arrival time 0730 Nothing to eat or drink after midnight No meds AM of procedure Responsible person to drive you home and stay with you for 24 hrs  Have you missed any doses of anti-coagulant Eliquis - takes twice a day, hasn't missed any doses in last 4 weeks.  Don't take dose morning of procedure.

## 2024-06-30 NOTE — Anesthesia Preprocedure Evaluation (Signed)
 Anesthesia Evaluation  Patient identified by MRN, date of birth, ID band Patient awake    Reviewed: Allergy & Precautions, H&P , NPO status , Patient's Chart, lab work & pertinent test results  Airway Mallampati: III  TM Distance: >3 FB Neck ROM: Full    Dental no notable dental hx. (+) Teeth Intact, Dental Advisory Given   Pulmonary sleep apnea and Continuous Positive Airway Pressure Ventilation , former smoker   Pulmonary exam normal breath sounds clear to auscultation       Cardiovascular Exercise Tolerance: Good hypertension, Pt. on medications +CHF  + dysrhythmias Atrial Fibrillation  Rhythm:Regular Rate:Normal     Neuro/Psych   Anxiety     negative neurological ROS     GI/Hepatic Neg liver ROS,GERD  Medicated,,  Endo/Other  negative endocrine ROS    Renal/GU negative Renal ROS  negative genitourinary   Musculoskeletal   Abdominal   Peds  Hematology negative hematology ROS (+)   Anesthesia Other Findings   Reproductive/Obstetrics negative OB ROS                              Anesthesia Physical Anesthesia Plan  ASA: 3  Anesthesia Plan: General   Post-op Pain Management: Tylenol  PO (pre-op)*   Induction: Intravenous  PONV Risk Score and Plan: 3 and Ondansetron , Dexamethasone  and Midazolam   Airway Management Planned: Oral ETT  Additional Equipment:   Intra-op Plan:   Post-operative Plan: Extubation in OR  Informed Consent: I have reviewed the patients History and Physical, chart, labs and discussed the procedure including the risks, benefits and alternatives for the proposed anesthesia with the patient or authorized representative who has indicated his/her understanding and acceptance.     Dental advisory given  Plan Discussed with: CRNA  Anesthesia Plan Comments:          Anesthesia Quick Evaluation

## 2024-07-01 ENCOUNTER — Ambulatory Visit (HOSPITAL_COMMUNITY)
Admission: RE | Admit: 2024-07-01 | Discharge: 2024-07-01 | Disposition: A | Discharge status: Home / Self Care | Attending: Cardiology | Admitting: Cardiology

## 2024-07-01 ENCOUNTER — Ambulatory Visit (HOSPITAL_COMMUNITY): Payer: Self-pay | Admitting: Anesthesiology

## 2024-07-01 ENCOUNTER — Encounter (HOSPITAL_COMMUNITY): Admission: RE | Disposition: A | Payer: Self-pay | Source: Home / Self Care | Attending: Cardiology

## 2024-07-01 ENCOUNTER — Other Ambulatory Visit: Payer: Self-pay

## 2024-07-01 DIAGNOSIS — K219 Gastro-esophageal reflux disease without esophagitis: Secondary | ICD-10-CM | POA: Diagnosis not present

## 2024-07-01 DIAGNOSIS — I422 Other hypertrophic cardiomyopathy: Secondary | ICD-10-CM | POA: Insufficient documentation

## 2024-07-01 DIAGNOSIS — Z79899 Other long term (current) drug therapy: Secondary | ICD-10-CM | POA: Insufficient documentation

## 2024-07-01 DIAGNOSIS — I110 Hypertensive heart disease with heart failure: Secondary | ICD-10-CM | POA: Insufficient documentation

## 2024-07-01 DIAGNOSIS — Z87891 Personal history of nicotine dependence: Secondary | ICD-10-CM | POA: Insufficient documentation

## 2024-07-01 DIAGNOSIS — Z7901 Long term (current) use of anticoagulants: Secondary | ICD-10-CM | POA: Diagnosis not present

## 2024-07-01 DIAGNOSIS — I5032 Chronic diastolic (congestive) heart failure: Secondary | ICD-10-CM | POA: Insufficient documentation

## 2024-07-01 DIAGNOSIS — G4730 Sleep apnea, unspecified: Secondary | ICD-10-CM | POA: Diagnosis not present

## 2024-07-01 DIAGNOSIS — I480 Paroxysmal atrial fibrillation: Secondary | ICD-10-CM | POA: Diagnosis present

## 2024-07-01 HISTORY — PX: ATRIAL FIBRILLATION ABLATION: EP1191

## 2024-07-01 LAB — POCT ACTIVATED CLOTTING TIME: Activated Clotting Time: 302 s

## 2024-07-01 SURGERY — ATRIAL FIBRILLATION ABLATION
Anesthesia: General

## 2024-07-01 MED ORDER — ACETAMINOPHEN 500 MG PO TABS
1000.0000 mg | ORAL_TABLET | Freq: Once | ORAL | Status: AC
  Administered 2024-07-01: 1000 mg via ORAL
  Filled 2024-07-01: qty 2

## 2024-07-01 MED ORDER — MIDAZOLAM HCL 5 MG/5ML IJ SOLN
INTRAMUSCULAR | Status: DC | PRN
  Administered 2024-07-01: 2 mg via INTRAVENOUS

## 2024-07-01 MED ORDER — FENTANYL CITRATE (PF) 100 MCG/2ML IJ SOLN
INTRAMUSCULAR | Status: AC
  Filled 2024-07-01: qty 2

## 2024-07-01 MED ORDER — SODIUM CHLORIDE 0.9 % IV SOLN: INTRAVENOUS | Status: DC | PRN

## 2024-07-01 MED ORDER — SODIUM CHLORIDE 0.9% FLUSH: 3.0000 mL | INTRAVENOUS | Status: DC | PRN

## 2024-07-01 MED ORDER — PROPOFOL 10 MG/ML IV BOLUS
INTRAVENOUS | Status: DC | PRN
Start: 2024-07-01 — End: 2024-07-01
  Administered 2024-07-01: 200 mg via INTRAVENOUS

## 2024-07-01 MED ORDER — HEPARIN SODIUM (PORCINE) 1000 UNIT/ML IJ SOLN
INTRAMUSCULAR | Status: DC | PRN
  Administered 2024-07-01: 15000 [IU] via INTRAVENOUS

## 2024-07-01 MED ORDER — DEXAMETHASONE SOD PHOSPHATE PF 10 MG/ML IJ SOLN
INTRAMUSCULAR | Status: DC | PRN
  Administered 2024-07-01: 10 mg via INTRAVENOUS

## 2024-07-01 MED ORDER — ONDANSETRON HCL 4 MG/2ML IJ SOLN: 4.0000 mg | Freq: Four times a day (QID) | INTRAMUSCULAR | Status: DC | PRN

## 2024-07-01 MED ORDER — SODIUM CHLORIDE 0.9 % IV SOLN
250.0000 mL | INTRAVENOUS | Status: DC | PRN
Start: 2024-07-01 — End: 2024-07-01

## 2024-07-01 MED ORDER — ACETAMINOPHEN 325 MG PO TABS: 650.0000 mg | ORAL_TABLET | ORAL | Status: DC | PRN

## 2024-07-01 MED ORDER — ATROPINE SULFATE 1 MG/10ML IJ SOSY
PREFILLED_SYRINGE | INTRAMUSCULAR | Status: DC | PRN
  Administered 2024-07-01: 1 mg via INTRAVENOUS

## 2024-07-01 MED ORDER — PHENYLEPHRINE HCL-NACL 20-0.9 MG/250ML-% IV SOLN
INTRAVENOUS | Status: DC | PRN
  Administered 2024-07-01: 50 ug/min via INTRAVENOUS

## 2024-07-01 MED ORDER — LIDOCAINE HCL (CARDIAC) PF 100 MG/5ML IV SOSY
PREFILLED_SYRINGE | INTRAVENOUS | Status: DC | PRN
  Administered 2024-07-01: 30 mg via INTRAVENOUS

## 2024-07-01 MED ORDER — SUGAMMADEX SODIUM 200 MG/2ML IV SOLN
INTRAVENOUS | Status: DC | PRN
  Administered 2024-07-01: 275 mg via INTRAVENOUS

## 2024-07-01 MED ORDER — SODIUM CHLORIDE 0.9 % IV SOLN: INTRAVENOUS | Status: DC

## 2024-07-01 MED ORDER — ONDANSETRON HCL 4 MG/2ML IJ SOLN
INTRAMUSCULAR | Status: DC | PRN
  Administered 2024-07-01: 4 mg via INTRAVENOUS

## 2024-07-01 MED ORDER — HEPARIN (PORCINE) IN NACL 1000-0.9 UT/500ML-% IV SOLN
INTRAVENOUS | Status: DC | PRN
  Administered 2024-07-01 (×2): 500 mL

## 2024-07-01 MED ORDER — ROCURONIUM BROMIDE 100 MG/10ML IV SOLN
INTRAVENOUS | Status: DC | PRN
  Administered 2024-07-01: 60 mg via INTRAVENOUS
  Administered 2024-07-01: 10 mg via INTRAVENOUS

## 2024-07-01 MED ORDER — PROTAMINE SULFATE 10 MG/ML IV SOLN
INTRAVENOUS | Status: DC | PRN
  Administered 2024-07-01: 40 mg via INTRAVENOUS

## 2024-07-01 MED ORDER — FENTANYL CITRATE (PF) 100 MCG/2ML IJ SOLN
INTRAMUSCULAR | Status: DC | PRN
  Administered 2024-07-01: 100 ug via INTRAVENOUS

## 2024-07-01 SURGICAL SUPPLY — 19 items
BLANKET WARM UNDERBOD FULL ACC (MISCELLANEOUS) ×1 IMPLANT
CABLE FARASTAR GEN2 SNGL USE (CABLE) IMPLANT
CATH ACUNAV GE 8F-90 (CATHETERS) IMPLANT
CATH FARAWAVE NAV 31 (CATHETERS) IMPLANT
CATH WEB BIDIR CS D-F NONAUTO (CATHETERS) IMPLANT
CLOSURE MYNX CONTROL 6F/7F (Vascular Products) IMPLANT
CLOSURE PERCLOSE PROSTYLE (Vascular Products) IMPLANT
COVER SWIFTLINK CONNECTOR (BAG) ×1 IMPLANT
DILATOR VESSEL 38 20CM 16FR (INTRODUCER) IMPLANT
GUIDEWIRE INQWIRE 1.5J.035X260 (WIRE) IMPLANT
KIT PATCH RHYTHMIA HDX (MISCELLANEOUS) IMPLANT
KIT VERSACROSS CNCT FARADRIVE (KITS) IMPLANT
PACK EP LF (CUSTOM PROCEDURE TRAY) ×1 IMPLANT
PAD DEFIB RADIO PHYSIO CONN (PAD) ×1 IMPLANT
SHEATH FARADRIVE STEERABLE (SHEATH) IMPLANT
SHEATH PINNACLE 5F 10CM (SHEATH) IMPLANT
SHEATH PINNACLE 8F 10CM (SHEATH) IMPLANT
SHEATH PINNACLE 9F 10CM (SHEATH) IMPLANT
SHEATH PROBE COVER 6X72 (BAG) IMPLANT

## 2024-07-01 NOTE — Discharge Instructions (Signed)

## 2024-07-01 NOTE — H&P (Signed)
  Electrophysiology Office Note:   Date:  07/01/2024  ID:  Chad Lyons, DOB Nov 15, 1965, MRN [id number]  Primary Cardiologist: Candyce Reek, MD Primary Heart Failure: None Electrophysiologist: None      History of Present Illness:   Chad Lyons is a 58 y.o. male with h/o chronic diastolic heart failure, diverticulitis, hypertension, hypertrophic cardiomyopathy seen today for  for Electrophysiology evaluation of atrial fibrillation at the request of Dan Bensimhon.    He was admitted to the hospital in 2023 with CHF.  Ejection fraction was 40 to 45% at the time with LVH.  Cardiac MRI showed ejection fraction of 35% with asymmetric septal hypertrophy.  RVEF was mildly reduced.  He was diagnosed with hypertrophic cardiomyopathy on goal-directed medical therapy.  He was again admitted April 2025 with atrial fibrillation and rapid response.  He was hypotensive he was started on amiodarone  requiring cardioversion.  He was again admitted in 2025 with heart failure and diuresed 11 L.  He has had multiple admissions over the last few months with both heart failure and atrial fibrillation.  He feels quite poor when he is in atrial fibrillation with weakness, fatigue, shortness of breath.  He prefer alternative rhythm control to amiodarone .  Today, denies symptoms of palpitations, chest pain, dyspnea, orthopnea, PND, lower extremity edema, claudication, dizziness, presyncope, syncope, bleeding, or neurologic sequela. The patient is tolerating medications without difficulties. Plan ablation today.   EP Information / Studies Reviewed:    EKG is not ordered today. EKG from 02/26/24 reviewed which showed sinus rhythm        Risk Assessment/Calculations:    CHA2DS2-VASc Score = 2   This indicates a 2.2% annual risk of stroke. The patient's score is based upon: CHF History: 1 HTN History: 1 Diabetes History: 0 Stroke History: 0 Vascular Disease History: 0 Age Score: 0 Gender Score: 0             Physical Exam:   VS:  BP (!) 139/90   Pulse 99   Resp 18   Ht 6' 1 (1.854 m)   Wt (!) 137.4 kg   SpO2 98%   BMI 39.98 kg/m    Wt Readings from Last 3 Encounters:  07/01/24 (!) 137.4 kg  06/14/24 (!) 136.3 kg  06/07/24 (!) 137.4 kg    GEN: Well nourished, well developed in no acute distress NECK: No JVD; No carotid bruits CARDIAC: Regular rate and rhythm, no murmurs, rubs, gallops RESPIRATORY:  Clear to auscultation without rales, wheezing or rhonchi  ABDOMEN: Soft, non-tender, non-distended EXTREMITIES:  No edema; No deformity    ASSESSMENT AND PLAN:    1.  Paroxysmal atrial fibrillation: Chad Lyons has presented today for surgery, with the diagnosis of AF.  The various methods of treatment have been discussed with the patient and family. After consideration of risks, benefits and other options for treatment, the patient has consented to  Procedure(s): Catheter ablation as a surgical intervention .  Risks include but not limited to complete heart block, stroke, esophageal damage, nerve damage, bleeding, vascular damage, tamponade, perforation, MI, and death. The patient's history has been reviewed, patient examined, no change in status, stable for surgery.  I have reviewed the patient's chart and labs.  Questions were answered to the patient's satisfaction.    Armanda Forand Inocencio, MD 07/01/2024 8:38 AM

## 2024-07-01 NOTE — Anesthesia Procedure Notes (Signed)
 Procedure Name: Intubation Date/Time: 07/01/2024 9:15 AM  Performed by: Obadiah Reyes BROCKS, CRNAPre-anesthesia Checklist: Patient identified, Emergency Drugs available, Suction available and Patient being monitored Patient Re-evaluated:Patient Re-evaluated prior to induction Oxygen  Delivery Method: Circle System Utilized Preoxygenation: Pre-oxygenation with 100% oxygen  Induction Type: IV induction Ventilation: Mask ventilation without difficulty Laryngoscope Size: Miller and 2 Grade View: Grade II Tube type: Oral Tube size: 7.5 mm Number of attempts: 1 Airway Equipment and Method: Stylet and Oral airway Placement Confirmation: ETT inserted through vocal cords under direct vision, positive ETCO2 and breath sounds checked- equal and bilateral Secured at: 23 cm Tube secured with: Tape Dental Injury: Teeth and Oropharynx as per pre-operative assessment

## 2024-07-01 NOTE — Anesthesia Postprocedure Evaluation (Signed)
 Anesthesia Post Note  Patient: [name]  Procedure(s) Performed: ATRIAL FIBRILLATION ABLATION     Patient location during evaluation: PACU Anesthesia Type: General Level of consciousness: awake and alert Pain management: pain level controlled Vital Signs Assessment: post-procedure vital signs reviewed and stable Respiratory status: spontaneous breathing, nonlabored ventilation and respiratory function stable Cardiovascular status: blood pressure returned to baseline and stable Postop Assessment: no apparent nausea or vomiting Anesthetic complications: no   There were no known notable events for this encounter.  Last Vitals:  Vitals:   07/01/24 1120 07/01/24 1134  BP: 119/76 126/77  Pulse: (!) 59 (!) 57  Resp: 16 15  Temp:    SpO2: 96% 97%    Last Pain:  Vitals:   07/01/24 1047  TempSrc: Oral  PainSc: 0-No pain                 Daniil Labarge,W. EDMOND

## 2024-07-01 NOTE — Progress Notes (Signed)
 Patient and patient friend given discharge instructions, education provided no further questions at this time. Patient able to ambulate and void before discharge. Able to tolerate PO intake. Patient site is clean, dry, intact with no hematoma noted upon discharge. Verified with MD Camnitz when to restart Eliquis .

## 2024-07-01 NOTE — Transfer of Care (Signed)
 Immediate Anesthesia Transfer of Care Note  Patient: Chad Lyons  Procedure(s) Performed: ATRIAL FIBRILLATION ABLATION  Patient Location: PACU  Anesthesia Type:General  Level of Consciousness: awake, alert , and oriented  Airway & Oxygen  Therapy: Patient Spontanous Breathing and Patient connected to nasal cannula oxygen   Post-op Assessment: Report given to RN and Post -op Vital signs reviewed and stable  Post vital signs: Reviewed and stable  Last Vitals:  Vitals Value Taken Time  BP 126/85 07/01/24 10:20  Temp 36.5 C 07/01/24 10:20  Pulse 60 07/01/24 10:21  Resp 10 07/01/24 10:21  SpO2 98 % 07/01/24 10:21  Vitals shown include unfiled device data.  Last Pain:  Vitals:   07/01/24 1020  TempSrc: Axillary  PainSc:          Complications: There were no known notable events for this encounter.

## 2024-07-02 ENCOUNTER — Encounter (HOSPITAL_COMMUNITY): Payer: Self-pay | Admitting: Cardiology

## 2024-07-04 ENCOUNTER — Telehealth (HOSPITAL_COMMUNITY): Payer: Self-pay

## 2024-07-04 NOTE — Telephone Encounter (Signed)
 Attempted to reach patient to follow up with procedure completed on 07/01/24, no answer. Left VM for patient to return call.

## 2024-07-08 ENCOUNTER — Telehealth: Payer: Self-pay | Admitting: Internal Medicine

## 2024-07-08 NOTE — Telephone Encounter (Signed)
 Patient stated he had a cardio ablation recently and has now developed red hives on the inner thighs of both legs.  Patient stated he had been taking Benadryl  but this has not helped

## 2024-07-08 NOTE — Telephone Encounter (Signed)
 Followed up with pt and let him know, per Dr. Inocencio, keep doing what he is doing and if persist to see PCP for evaluation. Pt so appreciates my late follow up on this. He is agreeable to plan.

## 2024-07-08 NOTE — Telephone Encounter (Signed)
 Patient reports on Tuesday (07/05/24) he developed red hives on both sides of his inner thighs. He said the areas are red hot, itching. He is taking Benadryl  25 mg and using triamcinolone  cream. Symptoms improve overnight, but flare up as the day progresses.  Patient had AF ablation on 07/01/24.  He denies any swelling, drainage or fever. Reports a little soreness at left groin site, otherwise no concern regarding sites.  Will forward to Dr. Inocencio and Pharmacy staff for review and further advisement.

## 2024-07-18 ENCOUNTER — Telehealth (HOSPITAL_COMMUNITY): Payer: Self-pay | Admitting: Cardiology

## 2024-07-18 MED ORDER — DAPAGLIFLOZIN PROPANEDIOL 10 MG PO TABS: 10.0000 mg | ORAL_TABLET | Freq: Every day | ORAL | 5 refills | Status: AC

## 2024-07-18 NOTE — Telephone Encounter (Signed)
 Patient called to request refills on farxiga 

## 2024-07-29 ENCOUNTER — Ambulatory Visit (HOSPITAL_COMMUNITY)
Admission: RE | Admit: 2024-07-29 | Discharge: 2024-07-29 | Disposition: A | Source: Ambulatory Visit | Attending: Internal Medicine | Admitting: Internal Medicine

## 2024-07-29 ENCOUNTER — Encounter (HOSPITAL_COMMUNITY): Payer: Self-pay | Admitting: Internal Medicine

## 2024-07-29 VITALS — BP 138/90 | HR 56 | Ht 73.0 in | Wt 318.8 lb

## 2024-07-29 DIAGNOSIS — Z5181 Encounter for therapeutic drug level monitoring: Secondary | ICD-10-CM | POA: Diagnosis not present

## 2024-07-29 DIAGNOSIS — D6869 Other thrombophilia: Secondary | ICD-10-CM | POA: Diagnosis not present

## 2024-07-29 DIAGNOSIS — I480 Paroxysmal atrial fibrillation: Secondary | ICD-10-CM | POA: Diagnosis not present

## 2024-07-29 DIAGNOSIS — Z79899 Other long term (current) drug therapy: Secondary | ICD-10-CM

## 2024-07-29 NOTE — Progress Notes (Signed)
 Primary Care Physician: Merna Huxley, NP Primary Cardiologist: Candyce Reek, MD Electrophysiologist: None     Referring Physician: Dr. Inocencio Redell Chad Lyons is a 58 y.o. male with a history of hypertension, hypertrophic cardiomyopathy, acute on chronic diastolic CHF, pulmonary hypertension, and paroxysmal atrial fibrillation who presents for consultation in the [facility]. Patient is on Eliquis  for stroke prevention.  On evaluation today, patient is currently in NSR. S/p Afib ablation on 07/01/2024 by Dr. Inocencio. No episodes of Afib since ablation.  He is taking amiodarone  200 mg daily.  No chest pain or SOB. Leg sites healed without issue.  He does note a little bit of fatigue at times.  No missed doses of anticoagulant.  Today, he denies symptoms of orthopnea, PND, lower extremity edema, dizziness, presyncope, syncope, snoring, daytime somnolence, bleeding, or neurologic sequela. The patient is tolerating medications without difficulties and is otherwise without complaint today.    he has a BMI of Body mass index is 42.06 kg/m.SABRA Filed Weights   07/29/24 0900  Weight: (!) 144.6 kg    Current Outpatient Medications  Medication Sig Dispense Refill   acetaminophen  (TYLENOL ) 500 MG tablet Take 500 mg by mouth as needed for mild pain (pain score 1-3) or moderate pain (pain score 4-6).     ALPRAZolam  (XANAX ) 0.5 MG tablet Take 0.5 mg by mouth daily as needed for anxiety.     amiodarone  (PACERONE ) 200 MG tablet Take 1 tablet (200 mg total) by mouth daily. 90 tablet 3   B Complex Vitamins (B COMPLEX PO) Take 1 each by mouth in the morning. OTC B-Complex gummy supplement.     benzonatate  (TESSALON ) 200 MG capsule Take 1 capsule (200 mg total) by mouth 3 (three) times daily as needed. 30 capsule 0   Cholecalciferol (VITAMIN D -3 PO) Take 1 each by mouth daily. OTC Vitamin D3 gummy supplement     dapagliflozin  propanediol (FARXIGA ) 10 MG TABS tablet  Take 1 tablet (10 mg total) by mouth daily before breakfast. 30 tablet 5   ELIQUIS  5 MG TABS tablet TAKE 1 TABLET BY MOUTH TWICE A DAY 180 tablet 3   Famotidine  (PEPCID  PO) Take 2 tablets by mouth as needed.     meloxicam (MOBIC) 15 MG tablet Take 15 mg by mouth daily as needed for pain.     Multiple Vitamins-Minerals (MENS MULTIVITAMIN GUMMIES) CHEW Chew 1 each by mouth daily.     Omeprazole Magnesium (PRILOSEC OTC PO) Take 1 tablet by mouth as needed.     OVER THE COUNTER MEDICATION Take 1 each by mouth in the morning and at bedtime. OTC Keto + ACV gummy supplement     potassium chloride  SA (KLOR-CON  M) 20 MEQ tablet TAKE 1 TABLET BY MOUTH TWICE A DAY (Patient taking differently: Take 20 mEq by mouth daily.) 180 tablet 3   sacubitril -valsartan  (ENTRESTO ) 49-51 MG Take 1 tablet by mouth 2 (two) times daily. 60 tablet 6   torsemide  (DEMADEX ) 20 MG tablet Take 1 tablet (20 mg total) by mouth daily. 30 tablet 6   triamcinolone  cream (KENALOG ) 0.1 % APPLY TOPICALLY 2 TIMES DAILY AS NEEDED (RASH). 30 g 0   No current facility-administered medications for this encounter.    Atrial Fibrillation Management history:  Previous antiarrhythmic drugs: Amiodarone  Previous cardioversions: 10/03/2021 Previous ablations: 07/01/2024  Anticoagulation history: Eliquis    ROS- All systems are reviewed and negative except as per the HPI above.  Physical Exam: BP (!) 138/90  Pulse (!) 56   Ht 6' 1 (1.854 m)   Wt (!) 144.6 kg   BMI 42.06 kg/m   GEN: Well nourished, well developed in no acute distress NECK: No JVD; No carotid bruits CARDIAC: Regular rate and rhythm, no murmurs, rubs, gallops RESPIRATORY:  Clear to auscultation without rales, wheezing or rhonchi  ABDOMEN: Soft, non-tender, non-distended EXTREMITIES:  No edema; No deformity   EKG today demonstrates  EKG Interpretation Date/Time:  Friday July 29 2024 09:02:23 EST Ventricular Rate:  56 PR Interval:  182 QRS Duration:  138 QT  Interval:  500 QTC Calculation: 482 R Axis:   -45  Text Interpretation: Sinus bradycardia with occasional Premature ventricular complexes Left axis deviation Non-specific intra-ventricular conduction block Minimal voltage criteria for LVH, may be normal variant ( Cornell product ) T wave abnormality, consider lateral ischemia Abnormal ECG When compared with ECG of 01-Jul-2024 10:29, Premature ventricular complexes are now Present Confirmed by Terra Pac ([id number]) on 07/29/2024 9:04:31 AM    Echo 11/27/23 demonstrated  1. Left ventricular ejection fraction, by estimation, is 50 to 55%. The  left ventricle has low normal function. The left ventricle has no regional  wall motion abnormalities. There is severe concentric left ventricular  hypertrophy consistent with  hypertrophic cardiomyopathy. Left ventricular diastolic parameters are  consistent with Grade I diastolic dysfunction (impaired relaxation).   2. Right ventricular systolic function is low normal. The right  ventricular size is mildly enlarged.   3. The mitral valve is normal in structure. Trivial mitral valve  regurgitation. No evidence of mitral stenosis.   4. The aortic valve is normal in structure. Aortic valve regurgitation is  mild. No aortic stenosis is present.   5. The inferior vena cava is normal in size with greater than 50%  respiratory variability, suggesting right atrial pressure of 3 mmHg.    ASSESSMENT & PLAN CHA2DS2-VASc Score = 2  The patient's score is based upon: CHF History: 1 HTN History: 1 Diabetes History: 0 Stroke History: 0 Vascular Disease History: 0 Age Score: 0 Gender Score: 0       ASSESSMENT AND PLAN: Paroxysmal Atrial Fibrillation (ICD10:  I48.0) The patient's CHA2DS2-VASc score is 2, indicating a 2.2% annual risk of stroke.   S/p A-fib ablation on 07/01/2024 by Dr. Inocencio.  Patient is currently in NSR.  We discussed what to expect in the recovery period following ablation.  We  discussed paroxysmal episodes of A-fib which may occur during the blanking period.  Anticipate discontinuation of amiodarone  at upcoming office visit.  High risk medication monitoring (ICD10: U5195107) Patient requires ongoing monitoring for anti-arrhythmic medication which has the potential to cause life threatening arrhythmias or AV block. Qtc stable. Continue amiodarone  200 mg daily.   Secondary Hypercoagulable State (ICD10:  D68.69) The patient is at significant risk for stroke/thromboembolism based upon his CHA2DS2-VASc Score of 2.  Continue Apixaban  (Eliquis ).  No missed doses of Eliquis .  Continue Eliquis  5 mg twice daily without interruption in the blanking period.    Follow up with A-fib clinic as scheduled.   Terra Pac, [facility]  Afib Clinic [address] [place], [place] [address] [phone]

## 2024-08-02 ENCOUNTER — Ambulatory Visit

## 2024-08-19 ENCOUNTER — Encounter (HOSPITAL_COMMUNITY): Payer: Self-pay | Admitting: Internal Medicine

## 2024-09-05 ENCOUNTER — Telehealth: Payer: Self-pay

## 2024-09-05 NOTE — Transitions of Care (Post Inpatient/ED Visit) (Addendum)
" ° °  09/05/2024  Name: Chad Lyons MRN: [id number] DOB: 06/05/1966  Today's TOC FU Call Status: Today's TOC FU Call Status:: Unsuccessful Call (1st Attempt) Unsuccessful Call (1st Attempt) Date: 09/05/24  Attempted to reach the patient regarding the most recent Inpatient/ED visit.  Follow Up Plan: Additional outreach attempts will be made to reach the patient to complete the Transitions of Care (Post Inpatient/ED visit) call.   Alan Ee, RN, BSN, CEN Population Health- Transition of Care Team.  [facility] [phone]  "

## 2024-09-05 NOTE — Transitions of Care (Post Inpatient/ED Visit) (Signed)
" ° °  09/05/2024  Name: Chad Lyons MRN: [id number] DOB: 01/22/1966  Today's TOC FU Call Status: Today's TOC FU Call Status:: Unsuccessful Call (2nd Attempt) Unsuccessful Call (2nd Attempt) Date: 09/05/24  Attempted to reach the patient regarding the most recent Inpatient/ED visit.  Follow Up Plan: Additional outreach attempts will be made to reach the patient to complete the Transitions of Care (Post Inpatient/ED visit) call.   Alan Ee, RN, BSN, CEN Population Health- Transition of Care Team.  [facility] [phone]  "

## 2024-09-06 ENCOUNTER — Telehealth: Payer: Self-pay

## 2024-09-06 NOTE — Transitions of Care (Post Inpatient/ED Visit) (Signed)
" ° °  09/06/2024  Name: Chad Lyons MRN: [id number] DOB: 02-02-1966  Today's TOC FU Call Status: Today's TOC FU Call Status:: Unsuccessful Call (3rd Attempt) Unsuccessful Call (3rd Attempt) Date: 09/06/24  Attempted to reach the patient regarding the most recent Inpatient/ED visit.  Follow Up Plan: No further outreach attempts will be made at this time. We have been unable to contact the patient.  Alan Ee, RN, BSN, CEN Population Health- Transition of Care Team.  [facility] [phone]  "

## 2024-09-19 ENCOUNTER — Telehealth: Payer: Self-pay | Admitting: *Deleted

## 2024-09-19 NOTE — Transitions of Care (Post Inpatient/ED Visit) (Signed)
" ° °  09/19/2024  Name: Chad Lyons MRN: [id number] DOB: August 09, 1966  Today's TOC FU Call Status: Today's TOC FU Call Status:: Unsuccessful Call (1st Attempt) Unsuccessful Call (1st Attempt) Date: 09/19/24  Attempted to reach the patient regarding the most recent Inpatient/ED visit.  Follow Up Plan: Additional outreach attempts will be made to reach the patient to complete the Transitions of Care (Post Inpatient/ED visit) call.   Cathlean Headland BSN RN [facility] Care Management Coordinator [email or web address] Direct Dial: [phone]  Fax: [phone] Website: [email or web address]  "

## 2024-09-21 ENCOUNTER — Telehealth: Payer: Self-pay | Admitting: *Deleted

## 2024-09-21 NOTE — Transitions of Care (Post Inpatient/ED Visit) (Signed)
" ° °  09/21/2024  Name: Chad Lyons MRN: [id number] DOB: 05-03-1966  Today's TOC FU Call Status: Unsuccessful Call (2nd Attempt) Date: 09/21/24  Attempted to reach the patient regarding the most recent Inpatient/ED visit.  Follow Up Plan: Additional outreach attempts will be made to reach the patient to complete the Transitions of Care (Post Inpatient/ED visit) call.   Cathlean Headland BSN RN [facility] Health Care Management Coordinator [email or web address] Direct Dial: [phone]  Fax: [phone] Website: [email or web address]  "

## 2024-09-29 ENCOUNTER — Ambulatory Visit (HOSPITAL_COMMUNITY): Admitting: Nurse Practitioner
